# Patient Record
Sex: Female | Born: 1935 | Race: White | Hispanic: No | Smoking: Former smoker
Health system: Southern US, Community
[De-identification: ages and names within clinical notes are randomized; demographics above are authoritative.]

## PROBLEM LIST (undated history)

## (undated) DIAGNOSIS — Z8639 Personal history of other endocrine, nutritional and metabolic disease: Secondary | ICD-10-CM

## (undated) DIAGNOSIS — S92009A Unspecified fracture of unspecified calcaneus, initial encounter for closed fracture: Secondary | ICD-10-CM

## (undated) DIAGNOSIS — N3941 Urge incontinence: Secondary | ICD-10-CM

## (undated) DIAGNOSIS — Z8719 Personal history of other diseases of the digestive system: Secondary | ICD-10-CM

## (undated) DIAGNOSIS — I739 Peripheral vascular disease, unspecified: Secondary | ICD-10-CM

## (undated) DIAGNOSIS — F419 Anxiety disorder, unspecified: Secondary | ICD-10-CM

## (undated) DIAGNOSIS — M1990 Unspecified osteoarthritis, unspecified site: Secondary | ICD-10-CM

## (undated) DIAGNOSIS — I6521 Occlusion and stenosis of right carotid artery: Secondary | ICD-10-CM

## (undated) DIAGNOSIS — I639 Cerebral infarction, unspecified: Secondary | ICD-10-CM

## (undated) DIAGNOSIS — J309 Allergic rhinitis, unspecified: Secondary | ICD-10-CM

## (undated) DIAGNOSIS — G5603 Carpal tunnel syndrome, bilateral upper limbs: Secondary | ICD-10-CM

## (undated) DIAGNOSIS — K589 Irritable bowel syndrome without diarrhea: Secondary | ICD-10-CM

## (undated) DIAGNOSIS — R269 Unspecified abnormalities of gait and mobility: Secondary | ICD-10-CM

## (undated) DIAGNOSIS — K648 Other hemorrhoids: Secondary | ICD-10-CM

## (undated) DIAGNOSIS — G609 Hereditary and idiopathic neuropathy, unspecified: Secondary | ICD-10-CM

## (undated) DIAGNOSIS — I10 Essential (primary) hypertension: Secondary | ICD-10-CM

## (undated) DIAGNOSIS — K5730 Diverticulosis of large intestine without perforation or abscess without bleeding: Secondary | ICD-10-CM

## (undated) DIAGNOSIS — I63311 Cerebral infarction due to thrombosis of right middle cerebral artery: Secondary | ICD-10-CM

## (undated) DIAGNOSIS — J449 Chronic obstructive pulmonary disease, unspecified: Secondary | ICD-10-CM

## (undated) HISTORY — DX: Unspecified abnormalities of gait and mobility: R26.9

## (undated) HISTORY — DX: Anxiety disorder, unspecified: F41.9

## (undated) HISTORY — DX: Essential (primary) hypertension: I10

## (undated) HISTORY — DX: Unspecified fracture of unspecified calcaneus, initial encounter for closed fracture: S92.009A

## (undated) HISTORY — DX: Cerebral infarction due to thrombosis of right middle cerebral artery: I63.311

## (undated) HISTORY — DX: Cerebral infarction, unspecified: I63.9

## (undated) HISTORY — DX: Peripheral vascular disease, unspecified: I73.9

## (undated) HISTORY — DX: Hereditary and idiopathic neuropathy, unspecified: G60.9

## (undated) HISTORY — DX: Irritable bowel syndrome without diarrhea: K58.9

---

## 1971-05-27 HISTORY — PX: DILATION AND CURETTAGE OF UTERUS: SHX78

## 2003-05-27 HISTORY — PX: COLONOSCOPY: SHX174

## 2005-09-08 DIAGNOSIS — R5383 Other fatigue: Secondary | ICD-10-CM | POA: Insufficient documentation

## 2005-09-25 ENCOUNTER — Ambulatory Visit: Payer: Self-pay | Admitting: Gastroenterology

## 2005-09-30 ENCOUNTER — Ambulatory Visit: Payer: Self-pay | Admitting: Gastroenterology

## 2006-01-05 DIAGNOSIS — N3281 Overactive bladder: Secondary | ICD-10-CM | POA: Insufficient documentation

## 2006-05-20 DIAGNOSIS — K5900 Constipation, unspecified: Secondary | ICD-10-CM | POA: Insufficient documentation

## 2006-11-11 ENCOUNTER — Ambulatory Visit: Payer: Self-pay | Admitting: Vascular Surgery

## 2006-12-02 DIAGNOSIS — R269 Unspecified abnormalities of gait and mobility: Secondary | ICD-10-CM | POA: Insufficient documentation

## 2006-12-16 DIAGNOSIS — F419 Anxiety disorder, unspecified: Secondary | ICD-10-CM | POA: Insufficient documentation

## 2007-05-04 ENCOUNTER — Ambulatory Visit: Payer: Self-pay | Admitting: Vascular Surgery

## 2007-11-02 ENCOUNTER — Ambulatory Visit: Payer: Self-pay | Admitting: Vascular Surgery

## 2007-11-09 ENCOUNTER — Ambulatory Visit: Payer: Self-pay | Admitting: Vascular Surgery

## 2007-11-18 ENCOUNTER — Encounter: Payer: Self-pay | Admitting: Vascular Surgery

## 2007-11-18 ENCOUNTER — Ambulatory Visit: Payer: Self-pay | Admitting: Vascular Surgery

## 2007-11-18 ENCOUNTER — Inpatient Hospital Stay (HOSPITAL_COMMUNITY): Admission: RE | Admit: 2007-11-18 | Discharge: 2007-11-19 | Payer: Self-pay | Admitting: Vascular Surgery

## 2007-11-18 HISTORY — PX: CAROTID ENDARTERECTOMY: SUR193

## 2007-11-25 DIAGNOSIS — I70219 Atherosclerosis of native arteries of extremities with intermittent claudication, unspecified extremity: Secondary | ICD-10-CM | POA: Insufficient documentation

## 2007-12-07 ENCOUNTER — Ambulatory Visit: Payer: Self-pay | Admitting: Vascular Surgery

## 2008-05-09 ENCOUNTER — Ambulatory Visit: Payer: Self-pay | Admitting: Vascular Surgery

## 2008-11-11 ENCOUNTER — Emergency Department (HOSPITAL_COMMUNITY): Admission: EM | Admit: 2008-11-11 | Discharge: 2008-11-12 | Payer: Self-pay | Admitting: Emergency Medicine

## 2008-11-14 DIAGNOSIS — E871 Hypo-osmolality and hyponatremia: Secondary | ICD-10-CM | POA: Insufficient documentation

## 2009-02-09 ENCOUNTER — Ambulatory Visit: Payer: Self-pay | Admitting: Vascular Surgery

## 2009-05-02 DIAGNOSIS — M25569 Pain in unspecified knee: Secondary | ICD-10-CM | POA: Insufficient documentation

## 2009-05-02 DIAGNOSIS — M545 Low back pain, unspecified: Secondary | ICD-10-CM | POA: Insufficient documentation

## 2009-05-28 DIAGNOSIS — R059 Cough, unspecified: Secondary | ICD-10-CM | POA: Insufficient documentation

## 2009-05-31 ENCOUNTER — Encounter: Admission: RE | Admit: 2009-05-31 | Discharge: 2009-05-31 | Payer: Self-pay | Admitting: Internal Medicine

## 2009-09-13 ENCOUNTER — Ambulatory Visit: Payer: Self-pay | Admitting: Vascular Surgery

## 2009-12-07 DIAGNOSIS — R195 Other fecal abnormalities: Secondary | ICD-10-CM | POA: Insufficient documentation

## 2010-02-20 ENCOUNTER — Encounter: Admission: RE | Admit: 2010-02-20 | Discharge: 2010-02-20 | Payer: Self-pay | Admitting: Internal Medicine

## 2010-03-11 DIAGNOSIS — K589 Irritable bowel syndrome without diarrhea: Secondary | ICD-10-CM

## 2010-03-11 HISTORY — DX: Irritable bowel syndrome, unspecified: K58.9

## 2010-03-27 ENCOUNTER — Ambulatory Visit: Payer: Self-pay | Admitting: Vascular Surgery

## 2010-06-24 DIAGNOSIS — E507 Other ocular manifestations of vitamin A deficiency: Secondary | ICD-10-CM | POA: Insufficient documentation

## 2010-06-24 DIAGNOSIS — N895 Stricture and atresia of vagina: Secondary | ICD-10-CM | POA: Insufficient documentation

## 2010-06-24 DIAGNOSIS — K117 Disturbances of salivary secretion: Secondary | ICD-10-CM | POA: Insufficient documentation

## 2010-09-02 LAB — BASIC METABOLIC PANEL WITH GFR
BUN: 19 mg/dL (ref 6–23)
Calcium: 8.9 mg/dL (ref 8.4–10.5)
Chloride: 96 meq/L (ref 96–112)
Creatinine, Ser: 0.77 mg/dL (ref 0.4–1.2)
GFR calc non Af Amer: >60 mL/min (ref >60)
Potassium: 3.8 meq/L (ref 3.5–5.1)

## 2010-09-02 LAB — URINALYSIS, ROUTINE W REFLEX MICROSCOPIC
Bilirubin Urine: NEGATIVE
Glucose, UA: NEGATIVE mg/dL
Ketones, ur: NEGATIVE mg/dL
Leukocytes, UA: NEGATIVE
Nitrite: NEGATIVE
Protein, ur: NEGATIVE mg/dL
Specific Gravity, Urine: 1.005 (ref 1.005–1.030)
Urobilinogen, UA: 0.2 mg/dL (ref 0.0–1.0)
pH: 7.5 (ref 5.0–8.0)

## 2010-09-02 LAB — BASIC METABOLIC PANEL
CO2: 29 mEq/L (ref 19–32)
GFR calc Af Amer: >60 mL/min (ref >60)
Glucose, Bld: 125 mg/dL — ABNORMAL HIGH (ref 70–99)
Sodium: 131 mEq/L — ABNORMAL LOW (ref 135–145)

## 2010-09-02 LAB — CBC
HCT: 37.9 % (ref 36.0–46.0)
Hemoglobin: 12.4 g/dL (ref 12.0–15.0)
MCHC: 32.6 g/dL (ref 30.0–36.0)
MCV: 90.1 fL (ref 78.0–100.0)
Platelets: 253 10*3/uL (ref 150–400)
RBC: 4.21 MIL/uL (ref 3.87–5.11)
RDW: 14.8 % (ref 11.5–15.5)
WBC: 6.4 10*3/uL (ref 4.0–10.5)

## 2010-09-02 LAB — DIFFERENTIAL
Basophils Absolute: 0 K/uL (ref 0.0–0.1)
Basophils Relative: 1 % (ref 0–1)
Eosinophils Absolute: 0 10*3/uL (ref 0.0–0.7)
Eosinophils Relative: 0 % (ref 0–5)
Lymphocytes Relative: 15 % (ref 12–46)
Lymphs Abs: 1 10*3/uL (ref 0.7–4.0)
Monocytes Absolute: 0.3 K/uL (ref 0.1–1.0)
Monocytes Relative: 5 % (ref 3–12)
Neutro Abs: 5 K/uL (ref 1.7–7.7)
Neutrophils Relative %: 79 % — ABNORMAL HIGH (ref 43–77)

## 2010-09-02 LAB — URINE CULTURE: Colony Count: 50000

## 2010-09-02 LAB — URINE MICROSCOPIC-ADD ON

## 2010-09-17 DIAGNOSIS — R0989 Other specified symptoms and signs involving the circulatory and respiratory systems: Secondary | ICD-10-CM

## 2010-09-17 DIAGNOSIS — R202 Paresthesia of skin: Secondary | ICD-10-CM

## 2010-09-17 DIAGNOSIS — M542 Cervicalgia: Secondary | ICD-10-CM

## 2010-09-17 DIAGNOSIS — R1011 Right upper quadrant pain: Secondary | ICD-10-CM

## 2010-09-17 DIAGNOSIS — K117 Disturbances of salivary secretion: Secondary | ICD-10-CM

## 2010-09-17 DIAGNOSIS — R350 Frequency of micturition: Secondary | ICD-10-CM

## 2010-09-17 DIAGNOSIS — M545 Low back pain, unspecified: Secondary | ICD-10-CM

## 2010-09-17 DIAGNOSIS — H11149 Conjunctival xerosis, unspecified, unspecified eye: Secondary | ICD-10-CM

## 2010-09-17 DIAGNOSIS — Z9181 History of falling: Secondary | ICD-10-CM

## 2010-09-17 DIAGNOSIS — R5383 Other fatigue: Secondary | ICD-10-CM

## 2010-09-17 DIAGNOSIS — I10 Essential (primary) hypertension: Secondary | ICD-10-CM

## 2010-09-17 DIAGNOSIS — R55 Syncope and collapse: Secondary | ICD-10-CM

## 2010-09-17 DIAGNOSIS — M25569 Pain in unspecified knee: Secondary | ICD-10-CM

## 2010-09-17 DIAGNOSIS — K219 Gastro-esophageal reflux disease without esophagitis: Secondary | ICD-10-CM

## 2010-09-17 DIAGNOSIS — E871 Hypo-osmolality and hyponatremia: Secondary | ICD-10-CM

## 2010-09-17 DIAGNOSIS — E507 Other ocular manifestations of vitamin A deficiency: Secondary | ICD-10-CM

## 2010-09-17 DIAGNOSIS — E039 Hypothyroidism, unspecified: Secondary | ICD-10-CM

## 2010-09-17 DIAGNOSIS — G4700 Insomnia, unspecified: Secondary | ICD-10-CM

## 2010-09-17 DIAGNOSIS — I6529 Occlusion and stenosis of unspecified carotid artery: Secondary | ICD-10-CM

## 2010-09-17 DIAGNOSIS — G629 Polyneuropathy, unspecified: Secondary | ICD-10-CM

## 2010-09-17 DIAGNOSIS — R252 Cramp and spasm: Secondary | ICD-10-CM

## 2010-09-17 DIAGNOSIS — I739 Peripheral vascular disease, unspecified: Secondary | ICD-10-CM

## 2010-09-17 DIAGNOSIS — R739 Hyperglycemia, unspecified: Secondary | ICD-10-CM

## 2010-09-17 DIAGNOSIS — N390 Urinary tract infection, site not specified: Secondary | ICD-10-CM

## 2010-09-17 DIAGNOSIS — R059 Cough, unspecified: Secondary | ICD-10-CM

## 2010-09-17 DIAGNOSIS — K589 Irritable bowel syndrome without diarrhea: Secondary | ICD-10-CM

## 2010-09-17 DIAGNOSIS — R634 Abnormal weight loss: Secondary | ICD-10-CM

## 2010-09-17 DIAGNOSIS — F419 Anxiety disorder, unspecified: Secondary | ICD-10-CM

## 2010-09-17 DIAGNOSIS — R195 Other fecal abnormalities: Secondary | ICD-10-CM

## 2010-09-17 DIAGNOSIS — I70219 Atherosclerosis of native arteries of extremities with intermittent claudication, unspecified extremity: Secondary | ICD-10-CM

## 2010-09-17 DIAGNOSIS — R269 Unspecified abnormalities of gait and mobility: Secondary | ICD-10-CM

## 2010-09-17 DIAGNOSIS — N895 Stricture and atresia of vagina: Secondary | ICD-10-CM

## 2010-09-17 DIAGNOSIS — K648 Other hemorrhoids: Secondary | ICD-10-CM

## 2010-09-17 DIAGNOSIS — R05 Cough: Secondary | ICD-10-CM

## 2010-09-17 DIAGNOSIS — Z79899 Other long term (current) drug therapy: Secondary | ICD-10-CM

## 2010-09-17 DIAGNOSIS — R42 Dizziness and giddiness: Secondary | ICD-10-CM

## 2010-10-08 NOTE — Procedures (Signed)
CAROTID DUPLEX EXAM   INDICATION:  Followup, carotid artery disease.   HISTORY:  Diabetes:  No.  Cardiac:  No.  Hypertension:  No.  Smoking:  No.  Previous Surgery:  No.  CV History:  No.  Amaurosis Fugax No, Paresthesias No, Hemiparesis No.                                       RIGHT             LEFT  Brachial systolic pressure:         137               135  Brachial Doppler waveforms:         Triphasic         Triphasic  Vertebral direction of flow:        Antegrade         Antegrade  DUPLEX VELOCITIES (cm/sec)  CCA peak systolic                   79                79  ECA peak systolic                   127               68  ICA peak systolic                   144               444  ICA end diastolic                   38                130  PLAQUE MORPHOLOGY:                  Calcific          Mixed  PLAQUE AMOUNT:                      Moderate          Severe  PLAQUE LOCATION:                    ICA/ECA           ICA/ECA   IMPRESSION:  1. Right internal carotid artery shows evidence of 40-59% stenosis      showing no significant change from previous study.  2. Left internal carotid artery shows evidence of 80-99% stenosis, an      increase from previous study.  3. Bilateral vertebral arteries are antegrade; however, left shows      evidence of early systolic deceleration.   Dr. Hart Rochester was informed of results, and appointment was made to see Dr.  Edilia Bo on 11/09/07.   ___________________________________________  Di Kindle. Edilia Bo, M.D.   AS/MEDQ  D:  11/02/2007  T:  11/02/2007  Job:  [id number]

## 2010-10-08 NOTE — Assessment & Plan Note (Signed)
OFFICE VISIT   BABY, GIEGER  DOB:  Oct 17, 1921                                       05/09/2008  [id number]#:[id number]   I saw the patient in the office today for followup of her recent left  carotid endarterectomy.  This is a pleasant 77 year old woman who  underwent left carotid endarterectomy for a high-grade left carotid  stenosis on November 18, 2007.  She returns for a 75-month followup visit.  Since I saw her last she has had no history of stroke, TIAs, expressive  or receptive aphasia, or amaurosis fugax.  She continues to take Plavix  as she is unable to take aspirin because of her reflux.   REVIEW OF SYSTEMS:  She has had no recent chest pain, chest pressure,  palpitations or arrhythmias.  She has had no bronchitis, asthma or  wheezing.   PHYSICAL EXAMINATION:  This is a pleasant 77 year old woman who appears  her stated age.  Blood pressure 132/72, heart rate is 86.  Her neck  incision on the left is healed nicely.  I do not detect any carotid  bruits.  Lungs are clear bilaterally to auscultation.  Cardiac exam, she  has a regular rate and rhythm.  Neurologic exam is nonfocal.   Her carotid duplex scan shows that her left carotid endarterectomy site  is widely patent.  She has a 40-59% right carotid stenosis.  Velocities  have not changed over the last 6 months on the right.   She understands we would not consider right carotid endarterectomy  unless the stenosis progressed to greater than 80% or she develop new  neurologic symptoms.  I will see her back in 6 months for a followup  duplex scan.  She knows to call sooner if she has problems.   Di Kindle. Edilia Bo, M.D.  Electronically Signed   CSD/MEDQ  D:  05/09/2008  T:  05/11/2008  Job:  [id number]   cc:   Lenon Curt. Chilton Si, M.D.

## 2010-10-08 NOTE — Procedures (Signed)
CAROTID DUPLEX EXAM   INDICATION:  Follow-up left carotid endarterectomy.   HISTORY:  Diabetes:  No.  Cardiac:  No.  Hypertension:  No.  Smoking:  No.  Previous Surgery:  Left carotid endarterectomy on 11/18/2007.  CV History:  Asymptomatic.  Amaurosis Fugax No, Paresthesias No, Hemiparesis No.                                       RIGHT             LEFT  Brachial systolic pressure:         152               140  Brachial Doppler waveforms:         Normal            Normal  Vertebral direction of flow:        Antegrade         Bidirectional  DUPLEX VELOCITIES (cm/sec)  CCA peak systolic                   102               122 (D)  ECA peak systolic                   166               118  ICA peak systolic                   138               93  ICA end diastolic                   25                21  PLAQUE MORPHOLOGY:                  Mixed             Heterogeneous  PLAQUE AMOUNT:                      Moderate          Mild  PLAQUE LOCATION:                    ICA/ECA/CCA       CCA   IMPRESSION:  1. A 40-59% stenosis of the right internal carotid artery.  2. Patent left carotid endarterectomy site with no evidence of      internal carotid artery stenosis noted.  3. The left vertebral artery flow appears bidirectional.  4. Velocities are improved on the left and stable on the right side      when compared to the previous exam on 11/02/2007, with the left      vertebral artery flow becoming bidirectional since that time.   ___________________________________________  Di Kindle. Edilia Bo, M.D.   CH/MEDQ  D:  05/09/2008  T:  05/09/2008  Job:  [id number]

## 2010-10-08 NOTE — Discharge Summary (Signed)
[name], Tara Walters               ACCOUNT NO.:  [id number]   MEDICAL RECORD NO.:  [id number]          PATIENT TYPE:  INP   LOCATION:  3313                         FACILITY:  [facility]   PHYSICIAN:  Di Kindle. Edilia Bo, M.D.DATE OF BIRTH:  03/02/1922   DATE OF ADMISSION:  11/18/2007  DATE OF DISCHARGE:  11/19/2007                               DISCHARGE SUMMARY   DISCHARGE DIAGNOSIS:  Severe asymptomatic left internal carotid artery  stenosis.   PROCEDURES PERFORMED:  On November 18, 2007, left carotid endarterectomy  with Dacron patch angioplasty by Dr. Edilia Bo.   COMPLICATIONS:  None.   CONDITION AT DISCHARGE:  Stable and improving.   DISCHARGE MEDICATIONS:  1. Armour Thyroid 30 mg p.o. daily.  2. Nexium 40 mg p.o. daily, a.m. and p.m.  3. Lunesta 2 mg p.o. daily p.r.n. sleep.  4. Plavix 75 mg p.o. daily.  5. Vitamin D 1000 international units daily every morning.  6. Magnesium oxide 140 mg p.o. daily, a.m. and p.m.  7. Tylenol p.r.n.  8. Tums p.r.n.  9. Gaviscon p.r.n.  10.Tylox 1-2 tablets p.o. every 4 hours p.r.n. pain.   DISPOSITION:  She is discharged home to [facility] in stable and  improving condition with her wounds healing well.  She is instructed to  clean the wounds gently with soap and water.  She is to increase her  activity slowly.  She may shower starting November 20, 2007.  She should not  lift for 2 weeks or drive for 2 weeks.  She is instructed to call for  fever greater than 101.2, redness, drainage from incision, or severe  headaches.  She is to return to see Dr. Edilia Bo in 2 weeks.  The office  will call with the visit.   BRIEF IDENTIFYING STATEMENT:  For complete details, please refer to the  typed history and physical.  Briefly, this very pleasant 77 year old  woman was evaluated by Dr. Edilia Bo for asymptomatic left carotid  stenosis.  Her stenosis was significant, greater than 80%.  Dr. Edilia Bo  felt that she should undergo a left carotid endarterectomy.   She was  informed of the risks and benefits of the procedure and after careful  consideration, elected to proceed with surgery.   HOSPITAL COURSE:  Preoperative workup was completed as an outpatient.  She was brought in through same-day surgery and underwent the  aforementioned carotid endarterectomy.  For complete details, please  refer the typed operative report.  The procedure was without  complications.  She was returned to the post anesthesia care unit in  extubated condition.  Following stabilization, she was transferred to a  bed on a surgical step-down unit.  She was  observed overnight.  The following morning, she was walking.  She was  eating.  She was passing water.  Her neurologic exam was nonfocal.  Her  smile was symmetrical.  Her tongue was midline.  She was having no  difficulty swallowing or speaking.  She was desirous of discharge and  was discharged to [facility].      Wilmon Arms, PA  Di Kindle. Edilia Bo, M.D.  Electronically Signed    KEL/MEDQ  D:  11/19/2007  T:  11/19/2007  Job:  [id number]

## 2010-10-08 NOTE — Op Note (Signed)
Tara Walters, Tara Walters               ACCOUNT NO.:  [id number]   MEDICAL RECORD NO.:  [id number]          PATIENT TYPE:  INP   LOCATION:  3313                         FACILITY:  [facility]   PHYSICIAN:  Di Kindle. Edilia Bo, M.D.DATE OF BIRTH:  December 30, 1921   DATE OF PROCEDURE:  11/18/2007  DATE OF DISCHARGE:                               OPERATIVE REPORT   PREOPERATIVE DIAGNOSIS:  Greater than 80% left carotid stenosis.   POSTOPERATIVE DIAGNOSIS:  Greater than 80% left carotid stenosis.   PROCEDURE:  Left carotid endarterectomy with Dacron patch angioplasty.   SURGEON:  Di Kindle. Edilia Bo, MD   ASSISTANT:  Jerold Coombe, PA.   ANESTHESIA:  General.   INDICATIONS:  This is an 77 year old woman who I have been following  with a moderate left carotid stenosis.  On her most recent followup  visit on November 09, 2007, this had progressed to greater than 80% and left  carotid endarterectomy was recommended in order to lower her risk of  stroke.  The procedure and potential complications were discussed with  the patient preoperatively.  All of her questions were answered.  She  was agreeable to proceed.   TECHNIQUE:  The patient was taken to the operating room and received a  general anesthetic.  Arterial line had been placed by anesthesia.  The  left neck and upper chest were prepped and draped in the usual sterile  fashion.  An incision was made along the anterior border of the  sternocleidomastoid and the dissection carried down to the common  carotid artery, which was dissected free and controlled with Rumel  tourniquet.  Facial vein was divided between 2-0 silk ties.  The  internal carotid artery was controlled above the plaque with a red  vessel loop.  The superior thyroid arteries and external carotid artery  was controlled.  The patient was heparinized.  Clamps were then placed  on the internal, then the external, and then the common carotid artery.  A longitudinal  arteriotomy was made in the common carotid artery.  This  was extended to the plaque into the internal carotid artery.  I tried to  place a 12-shunt.  This did not go easily.  Therefore, I placed a 10-  shunt, which went easily and there was good backbleeding.  The shunt was  placed into the internal carotid artery, backbled, and then placed into  the common carotid artery and secured with a Rumel tourniquet.  Flow was  reestablished the shunt and I checked flow with a Doppler.  An  endarterectomy plane was established proximally and the plaque was  sharply divided proximally.  An eversion endarterectomy was performed of  the external carotid artery.  Distally, there was a nice taper in the  plaque and no tacking sutures were required.  The artery was irrigated  with copious amounts of heparin and dextran and all loose debris  removed.  A Dacron patch was then sewn using continuous 6-0 Prolene  suture.  Prior to completing the patch closure, the artery was backbled  and flushed appropriately and after the shunt was removed.  The  anastomosis was completed and then flow reestablished first to the  external carotid artery and then to the internal carotid artery.  At the  completion, there was a good pulse distal to the patch and good Doppler  signal.  Hemostasis was obtained of the wound.  The heparin was  partially reversed with protamine.  The wound was closed with deep layer  of 3-0 Vicryl.  The platysma was closed with running 3-0 Vicryl.  The skin was closed  with a 4-0 subcuticular stitch.  Sterile dressing was applied.  The  patient tolerated the procedure well and was transferred to the recovery  room in satisfactory condition.  She awoke neurologically intact.      Di Kindle. Edilia Bo, M.D.  Electronically Signed     CSD/MEDQ  D:  11/18/2007  T:  11/19/2007  Job:  [id number]   cc:   Lenon Curt. Chilton Si, M.D.

## 2010-10-08 NOTE — Procedures (Signed)
CAROTID DUPLEX EXAM   INDICATION:  Followup, carotid endarterectomy.   HISTORY:  Diabetes:  No.  Cardiac:  No.  Hypertension:  No.  Smoking:  No.  Previous Surgery:  Left carotid endarterectomy on 11/18/2007.  CV History:  Currently asymptomatic.  Amaurosis Fugax No, Paresthesias No, Hemiparesis No.                                       RIGHT             LEFT  Brachial systolic pressure:         162               150  Brachial Doppler waveforms:         Normal            Normal  Vertebral direction of flow:        Antegrade         Antegrade  DUPLEX VELOCITIES (cm/sec)  CCA peak systolic                   83                86  ECA peak systolic                   201               157  ICA peak systolic                   131               84  ICA end diastolic                   19                18  PLAQUE MORPHOLOGY:                  Heterogenous      Heterogenous  PLAQUE AMOUNT:                      Mild              Mild  PLAQUE LOCATION:                    ICA/ECA/CCA       ECA/CCA   IMPRESSION:  1. Doppler velocities suggest a low end 40% to 59% stenosis of the      right proximal internal carotid artery.  2. Patent left carotid endarterectomy site with no left internal      carotid artery stenosis.  3. Flow in the antegrade left vertebral artery demonstrates early      systolic deceleration.  4. No significant change noted when compared to the previous      examination on 02/09/2009.   ___________________________________________  Di Kindle. Edilia Bo, M.D.   CH/MEDQ  D:  03/27/2010  T:  03/27/2010  Job:  [id number]

## 2010-10-08 NOTE — Consult Note (Signed)
VASCULAR SURGERY CONSULTATION   Tara, Walters  DOB:  Jun 12, 1921                                       11/09/2007  [id number]#:[id number]   I saw the patient for continued followup of her carotid disease.  I had  initially seen her in consultation on 11/11/2006.  At that time, she was  noted to have a moderate 60-79% left carotid stenosis and a 40-59% right  carotid stenosis.  She had a followup study in December of 2008 which  showed no significant change.  On her most recent study, however, the  stenosis on the left had progressed to greater than 80%.  Of note, since  I saw her last, she has had no history of stroke, TIAs, expressive or  receptive aphasia, or amaurosis fugax.  There has been no significant  change in her medical history.  She denies any history of diabetes,  hypertension, hypercholesterolemia, history of previous myocardial  infarction, or history of congestive heart failure.  She does have a  history of reflux.   FAMILY HISTORY:  There is no history of premature cardiovascular  disease.   SOCIAL HISTORY:  She is widowed.  She quit tobacco in 1985.  Of note,  she resides at [facility].   ALLERGIES:  No known drug allergies.   MEDICATIONS:  1. Uro-Mag 140 mg p.o. b.i.d.  2. MiraLax 1 tablespoon p.r.n.  3. Colace 1 p.o. daily p.r.n.  4. Tums 1 q.i.d.  5. Vitamin D daily.  6. Thyroid 30 mg p.o. daily.  7. Nexium 40 mg p.o. b.i.d.  8. Lunesta 2 mg p.o. nightly p.r.n.   Of note, she does not take aspirin because it upsets her stomach.   REVIEW OF SYSTEMS:  GENERAL:  She has had no recent weight loss, weight  gain or problems with her appetite.  CARDIAC:  She has had no chest pain, chest pressure, palpitations or  arrhythmias.  She has no significant dyspnea on exertion.  PULMONARY:  She has had no bronchitis, asthma or wheezing.  GI:  She has had no recent change in bowel habits and has no history of  peptic ulcer disease.  GU:  She has had  no dysuria or frequency.  ORTHO/SKIN:  She has had some mild arthritis.  NEURO:  She has had no dizziness, blackouts, headaches or seizures.  HEMATOLOGIC:  She has had no bleeding problems that she is aware of.   PHYSICAL EXAMINATION:  This is a pleasant 77 year old woman who appears  her stated age.  Blood pressure is 166/68, heart rate is 68.  The neck  is supple.  There is no cervical lymphadenopathy.  She has left carotid  bruit.  Lungs are clear bilaterally to auscultation.  On cardiac exam,  she has a regular rate and rhythm.  Her abdomen is soft and nontender.  She has normal pitched bowel sounds.  She has palpable femoral pulses  and warm, well-perfused feet.  She has no significant lower extremity  swelling.  Neurologic exam is nonfocal.   Her carotid duplex scan shows that she has a 40-59% right carotid  stenosis.  She has a greater than 80% left carotid stenosis.  Peak  systolic velocities 444 cm/sec and end-diastolic velocity is 130 cm/sec.   Given that the stenosis on the left has progressed to greater than 80%,  I have  recommended left carotid endarterectomy in order to lower her  risk of future stroke.  We have discussed the indications for the  procedure and potential complications including but not limited to  bleeding, stroke (peri-procedural risk of 1-2%), nerve injury, MI, or  other unpredictable medical problems.  The daughter would like to  discuss this further with her and is considering getting another  opinion.  I explained I think this is perfectly reasonable.  She is  asymptomatic, so I do not think there is any urgency to the procedure.  I have told them that if they do elect to proceed with surgery, that  they can call and we can schedule this.  Of note, she is unable to take  aspirin and I have explained that would like to put on Plavix 75 mg  daily starting 2 to 3 days prior to surgery.  I will wait to hear from  her.  Otherwise, I plan on seeing her back  in 6 months.   Di Kindle. Edilia Bo, M.D.  Electronically Signed  CSD/MEDQ  D:  11/09/2007  T:  11/10/2007  Job:  [id number]   cc:   Lenon Curt. Chilton Si, M.D.

## 2010-10-08 NOTE — Consult Note (Signed)
VASCULAR SURGERY CONSULTATION   Tara Walters, Tara Walters  DOB:  07-Feb-1922                                       11/11/2006  [id number]#:[id number]   I saw [name] in the office today in consultation concerning her  moderate carotid disease.  She was referred by Dr. Chilton Si.  This is a  pleasant 77 year old right-handed woman who was found by Dr. Chilton Si to  have an asymptomatic carotid bruit.  This prompted a duplex scan which  showed a significant left carotid stenosis and she was sent for vascular  consultation.  Of note, she denies any history of a stroke, TIAs,  expressive or receptive aphasia, or amaurosis fugax.  She is ambulatory  with a walker.  She resides at [facility].   PAST MEDICAL HISTORY:  Is fairly unremarkable.  She denies any history  of diabetes, hypertension, hypercholesterolemia, history of previous  myocardial infarction or history of congestive heart failure.  She does  have a history of reflux.   FAMILY HISTORY:  There is no history of premature cardiovascular disease  that she is aware of.   SOCIAL HISTORY:  She is widowed.  She quit tobacco in 1985.   REVIEW OF SYSTEMS AND MEDICATIONS:  Are documented on the medical  history forms in her chart.   PHYSICAL EXAMINATION:  Blood pressure is 169/77, heart rate is 76.  She  has a left carotid bruit.  Lungs are clear bilaterally to auscultation.  On cardiac exam, she has a regular rate and rhythm.  Her abdomen is soft  and nontender.  I cannot palpate an aneurysm.  She has palpable femoral  pulses and warm, well-perfused feet.  Neurologic exam is nonfocal.   Carotid duplex scan in our office showed a 60-79% left carotid stenosis.  End-diastolic velocity in the left is 93 cm per second, so this was  clearly less than 80%.  On the right side she has a 40-59% stenosis.  Both vertebral arteries are patent with normally directed-flow.   I explained we generally do not consider carotid endarterectomy in  patients who are asymptomatic unless the stenosis progressed to greater  than 80% or she developed new neurologic symptoms.  I will see her back  in 6 months for a followup duplex scan.  She knows to call sooner if she  has problems.  She tells me that she cannot take aspirin because of her  reflux.  If it was at all possible for her to take an enteric-coated  baby aspirin, that would be helpful.  She knows to call sooner if she  has problems.   Di Kindle. Edilia Bo, M.D.  Electronically Signed  CSD/MEDQ  D:  11/11/2006  T:  11/12/2006  Job:  [id number]   cc:   Lenon Curt. Chilton Si, M.D.

## 2010-10-08 NOTE — Procedures (Signed)
CAROTID DUPLEX EXAM   INDICATION:  Followup evaluation of known carotid artery disease.   HISTORY:  Diabetes:  No  Cardiac:  No  Hypertension:  No  Smoking:  No  Previous Surgery:  No  CV History:  Previous duplex revealed a 40-59% right ICA stenosis and a  60-79% left ICA artery stenosis on 11/11/2006  Amaurosis Fugax No, Paresthesias No, Hemiparesis No                                       RIGHT             LEFT  Brachial systolic pressure:         140               l36  Brachial Doppler waveforms:         Triphasic         Triphasic  Vertebral direction of flow:        Antegrade         Antegrade  DUPLEX VELOCITIES (cm/sec)  CCA peak systolic                   93                83  ECA peak systolic                   159               124  ICA peak systolic                   122               362  ICA end diastolic                   19                87  PLAQUE MORPHOLOGY:                  Calcified         Mixed  PLAQUE AMOUNT:                      Mild to moderate  Moderately severe  PLAQUE LOCATION:                    Proximal ICA/ECA  Proximal ICA   IMPRESSION:  1. 40-59% right internal carotid artery stenosis.  2. 60-80% left internal carotid artery stenosis (high end of range).  3. No significant change since previous study performed 11/11/2006.   ___________________________________________  Di Kindle. Edilia Bo, M.D.   MC/MEDQ  D:  05/04/2007  T:  05/05/2007  Job:  [id number]

## 2010-10-08 NOTE — Assessment & Plan Note (Signed)
OFFICE VISIT   Tara Walters, Tara Walters  DOB:  22-Jul-1921                                       12/07/2007  JYNWG#:[id number]   I saw the patient in the office today in followup after her recent left  carotid endarterectomy.  This is an 77 year old with a moderate left  carotid stenosis.  This had progressed to greater than 80% and a left  carotid endarterectomy was recommended in order to lower her risk of  future stroke.  She underwent left carotid endarterectomy on 11/18/2007.  She did well postoperatively and was discharged on postoperative day #1.  She returns for her first followup visit.   Overall she has been doing quite well and has no specific complaints  except for some paresthesias in both feet, more significantly on the  left side.  She also describes some claudication bilaterally, more  significantly on the right side.  She has had no rest pain in her feet  and no history of nonhealing wounds.  Since her surgery she has had no  focal weakness and no expressive or receptive aphasia or amaurosis  fugax.   PHYSICAL EXAMINATION:  Vital signs:  On physical examination blood  pressure is 138/75, heart rate is 85.  Neck:  Her neck incision is  healing nicely.  Lungs:  Are clear bilaterally to auscultation.  Cardiac:  She has a regular rate and rhythm.  She has palpable femoral  pulses.  I cannot palpate popliteal or pedal pulses on either side.  She  has monophasic Doppler signals in the dorsalis pedis and posterior  tibial position on the right.  She has biphasic signals in the left  foot.  Both feet appear adequately perfused.   Given that her paresthesias are worse on the left side and she has  essentially biphasic flow on the left I think that her paresthesias are  more likely related to a neuropathy or possibly lumbar disk disease.  Based on her records from [facility] it appears she does have  some disk disease at the L4 and L5 level.  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  Regardless, I  certainly would not recommend any invasive vascular workup unless she  developed rest pain or a nonhealing wound.  I will see her back in 6  months for followup carotid duplex to follow her moderate right carotid  stenosis.  She knows to call sooner if she has problems.  In the  meantime she will remain on Plavix as she is not tolerant of aspirin.   Di Kindle. Edilia Bo, M.D.  Electronically Signed   CSD/MEDQ  D:  12/07/2007  T:  12/08/2007  Job:  [id number]   cc:   Lenon Curt. Chilton Si, M.D.  Toribio Harbour, N.P.

## 2010-10-08 NOTE — Procedures (Signed)
CAROTID DUPLEX EXAM   INDICATION:  Left carotid endarterectomy.   HISTORY:  Diabetes:  No.  Cardiac:  No.  Hypertension:  No.  Smoking:  No.  Previous Surgery:  Left carotid endarterectomy on 11/18/2007.  CV History:  Currently asymptomatic.  Amaurosis Fugax No, Paresthesias No, Hemiparesis No                                       RIGHT             LEFT  Brachial systolic pressure:         148               140  Brachial Doppler waveforms:         Normal            Normal  Vertebral direction of flow:        Antegrade         Bidirectional  DUPLEX VELOCITIES (cm/sec)  CCA peak systolic                   121               131  ECA peak systolic                   165               185  ICA peak systolic                   134               94  ICA end diastolic                   20                16  PLAQUE MORPHOLOGY:                  Mixed             Mixed  PLAQUE AMOUNT:                      Moderate          Mild  PLAQUE LOCATION:                    ICA/ECA/CCA       CCA   IMPRESSION:  1. 40-59% stenosis of the right internal carotid artery.  2. Patent left carotid endarterectomy with no evidence of left      internal carotid artery stenosis.  3. No significant change noted when compared to the previous exam on      05/09/2008.   ___________________________________________  Di Kindle. Edilia Bo, M.D.   CH/MEDQ  D:  02/09/2009  T:  02/10/2009  Job:  [id number]

## 2010-10-08 NOTE — Assessment & Plan Note (Signed)
OFFICE VISIT   Tara Walters, Tara Walters  DOB:  01-24-1922                                       09/13/2009  [id number]#:[id number]   I saw the patient in the office today concerning her leg pain.  She was  referred by Dr. Chilton Si.  This is a pleasant 77 year old woman who I had  actually performed a left carotid endarterectomy on in June 2009.  She  comes in today with a chief complaint of pain in both calves associated  with ambulation that is relieved with rest.  These symptoms have been  going on for approximately 6 months.  Her symptoms are more significant  on the right side.  The pain occurs at a variable distance.  There are  no alleviating factors except for rest.  There are no other aggravating  factors.  She denies any history of rest pain and has had no history of  nonhealing wounds.  In addition, she has had some paresthesias in her  feet, which she states are aggravated by walking and also aggravated by  sitting.   PAST MEDICAL HISTORY:  Significant for hypertension.  She denies any  history of diabetes, hypercholesterolemia, history of previous  myocardial infarction or history of congestive heart failure.   SOCIAL HISTORY:  She is widowed.  She quit tobacco well over 20 years  ago.   REVIEW OF SYSTEMS:  CARDIOVASCULAR:  She has had no chest pain or chest  pressure.  She does admit to dyspnea on exertion and occasional  orthopnea.  She has had no history of arrhythmias.  She has had no  history of stroke, TIAs or amaurosis fugax.  She has had no history of  DVT or phlebitis.  MUSCULOSKELETAL:  She does have a history of arthritis.  GI:  She does have a history of reflux and occasional diarrhea and  constipation.   PHYSICAL EXAMINATION:  This is a pleasant 77 year old woman who appears  her stated age.  Her blood pressure is 165/77, saturation is 100%, heart  rate is 54.  HEENT is unremarkable.  Lungs are clear bilaterally to  auscultation without  rales, rhonchi or wheezing.  On cardiovascular  exam, I do not detect any carotid bruits.  She has a regular rate and  rhythm.  There is no significant peripheral edema.  She has palpable  radial and femoral pulses.  I cannot palpate popliteal or pedal pulses.  She has no ischemic ulcers.  Abdomen is soft and nontender with no  masses appreciated.  She has normal-pitched bowel sounds.  Neurologic  exam:  She has no focal weakness or paresthesias.   I have reviewed her arterial Doppler study, which was done on May 31, 2009, and this shows monophasic Doppler signals in the dorsalis pedis  and posterior tibial positions.  She has an ABI of 67% on the right and  63% on the left.  Toe pressure is 118 mmHg on the right and 93 mmHg on  the left.  I have also reviewed her chest x-ray, which was done on  May 31, 2009, which showed COPD and stable mild cardiomegaly.   Based on her exam, she has evidence of bilateral infrainguinal arterial  occlusive disease.  However, currently her symptoms are quite tolerable  and she has no history of rest pain or nonhealing ulcers.  I  have  explained we generally would not consider arteriography and  revascularization unless she developed disabling claudication, rest pain  or a nonhealing ulcer.  I have encouraged her to continue to walk as  much as possible.  We discussed the potential use of cilostazol and we  both agree that we would hold off on this for now.  I have ordered  followup ABIs in September when she comes back for her routine followup  carotid duplex scan.  Her most recent carotid duplex scan was in  September 2010 and showed no evidence of significant carotid disease or  recurrent stenosis on the side of her previous left carotid  endarterectomy.  She knows to call sooner if she has problems.     Di Kindle. Edilia Bo, M.D.  Electronically Signed   CSD/MEDQ  D:  09/13/2009  T:  09/17/2009  Job:  [id number]   cc:   Dr. Frederik Pear

## 2010-10-08 NOTE — Assessment & Plan Note (Signed)
OFFICE VISIT   CAMARY, SOSA  DOB:  Oct 17, 1921                                       03/27/2010  BJYNW#:[id number]   NOTE:  Dictation code E4.   I saw the patient in the office today for continued followup of her  carotid disease and her peripheral vascular disease.  This is a pleasant  77 year old woman who had undergone a left carotid endarterectomy back  in 2009 and since that time I have been following a moderate right  carotid stenosis.  In addition, she has a history of peripheral vascular  disease.   Since I saw her last in April 2011, she denies any history of stroke,  TIAs, expressive or receptive aphasia, or amaurosis fugax.  She lives at  [facility].  She is ambulatory with a walker.  I do not get any history  of claudication although her activity is fairly limited.  I do not get  any history of rest pain or nonhealing ulcers.  She does have  paresthesias in both lower extremities, which have been stable and came  on gradually.   PAST MEDICAL HISTORY:  Significant for adult-onset diabetes,  hypertension.  She denies any history of previous myocardial infarction,  history congestive heart failure or history of COPD.   SOCIAL HISTORY:  She is widowed.  She does not smoke cigarettes.   REVIEW OF SYSTEMS:  CARDIOVASCULAR:  She has had no chest pain, chest  pressure, palpitations or arrhythmias.  She does admit to dyspnea on  exertion.  She has had no history of DVT or phlebitis.  GI:  She does have a history of reflux.  PULMONARY:  She had no recent productive cough bronchitis, asthma or  wheezing.   PHYSICAL EXAMINATION:  This is a pleasant 77 year old woman who appears  her stated age.  Her blood pressure is 138/60, heart rate is 59,  saturation 100%.  Lungs are clear bilaterally to auscultation without  rales, rhonchi or wheezing.  On cardiovascular exam I do not detect any  carotid bruits.  She has a regular rate and rhythm.  She has  palpable  femoral pulses.  I cannot palpate pedal pulses.  She has no ischemic  ulcers.  She has no significant lower extremity swelling.  Her abdomen  is soft and nontender with normal-pitched bowel sounds.  Musculoskeletal  exam:  There are no major deformities or cyanosis.  Neurologic exam:  There is no focal weakness or paresthesias.   I have independently interpreted her arterial Doppler study, which shows  a biphasic posterior tibial signal on the right with a monophasic  dorsalis pedis signal and an ABI of 77%.  On the left side she has a  monophasic posterior tibial signal with a biphasic dorsalis pedis signal  and an ABI of 66%.  She has evidence of moderate arterial insufficiency.   I have also independently interpreted her carotid duplex scan, which  shows that her left carotid endarterectomy site is widely patent.  On  the right side she has a 40%-59% stenosis.  These velocities are stable  compared to her previous study in September 2010.   Given that her duplex is stable, I think it is safe at this point to  stretch her followup out to a year and a half.  I have ordered a  followup carotid duplex scan  in 18 months and also followup ABIs at that  time.  I have encouraged her to stay as active as possible.  She knows  to call sooner if she has problems.     Di Kindle. Edilia Bo, M.D.  Electronically Signed   CSD/MEDQ  D:  03/27/2010  T:  03/28/2010  Job:  [id number]   cc:   Lenon Curt. Chilton Si, M.D.

## 2010-12-27 ENCOUNTER — Other Ambulatory Visit: Payer: Self-pay | Admitting: Geriatric Medicine

## 2010-12-27 ENCOUNTER — Other Ambulatory Visit: Payer: Self-pay | Admitting: Internal Medicine

## 2010-12-27 ENCOUNTER — Ambulatory Visit
Admission: RE | Admit: 2010-12-27 | Discharge: 2010-12-27 | Disposition: A | Discharge status: Home / Self Care | Payer: BC Managed Care – HMO | Source: Ambulatory Visit | Attending: Internal Medicine | Admitting: Internal Medicine

## 2010-12-27 DIAGNOSIS — R200 Anesthesia of skin: Secondary | ICD-10-CM

## 2010-12-27 DIAGNOSIS — M542 Cervicalgia: Secondary | ICD-10-CM

## 2011-02-20 LAB — BASIC METABOLIC PANEL
BUN: 9
CO2: 28
Calcium: 8.8
Chloride: 107
Creatinine, Ser: 0.85
GFR calc Af Amer: >60

## 2011-02-20 LAB — CBC
HCT: 40.1
MCHC: 33.2
MCV: 88.6
Platelets: 133 — ABNORMAL LOW
Platelets: 198
RBC: 3.41 — ABNORMAL LOW
RDW: 14.7

## 2011-02-20 LAB — URINALYSIS, ROUTINE W REFLEX MICROSCOPIC
Glucose, UA: NEGATIVE
Ketones, ur: NEGATIVE
Protein, ur: NEGATIVE
pH: 6.5

## 2011-02-20 LAB — TYPE AND SCREEN
ABO/RH(D): A NEG
Antibody Screen: NEGATIVE

## 2011-02-20 LAB — COMPREHENSIVE METABOLIC PANEL
ALT: 24
Albumin: 4
CO2: 28
Chloride: 99
GFR calc Af Amer: >60
GFR calc non Af Amer: 58 — ABNORMAL LOW
Potassium: 3.9
Sodium: 138

## 2011-02-20 LAB — URINE MICROSCOPIC-ADD ON

## 2011-02-20 LAB — PROTIME-INR: Prothrombin Time: 13.2

## 2011-02-20 LAB — APTT: aPTT: 26

## 2011-06-05 DIAGNOSIS — N3941 Urge incontinence: Secondary | ICD-10-CM | POA: Diagnosis not present

## 2011-06-05 DIAGNOSIS — R350 Frequency of micturition: Secondary | ICD-10-CM | POA: Diagnosis not present

## 2011-06-22 DIAGNOSIS — N289 Disorder of kidney and ureter, unspecified: Secondary | ICD-10-CM | POA: Diagnosis not present

## 2011-06-22 DIAGNOSIS — F4489 Other dissociative and conversion disorders: Secondary | ICD-10-CM | POA: Diagnosis not present

## 2011-07-03 DIAGNOSIS — R3915 Urgency of urination: Secondary | ICD-10-CM | POA: Diagnosis not present

## 2011-07-03 DIAGNOSIS — R350 Frequency of micturition: Secondary | ICD-10-CM | POA: Diagnosis not present

## 2011-07-14 DIAGNOSIS — R32 Unspecified urinary incontinence: Secondary | ICD-10-CM | POA: Diagnosis not present

## 2011-07-14 DIAGNOSIS — I10 Essential (primary) hypertension: Secondary | ICD-10-CM | POA: Diagnosis not present

## 2011-07-14 DIAGNOSIS — R0602 Shortness of breath: Secondary | ICD-10-CM | POA: Diagnosis not present

## 2011-07-14 DIAGNOSIS — G5600 Carpal tunnel syndrome, unspecified upper limb: Secondary | ICD-10-CM | POA: Diagnosis not present

## 2011-07-14 DIAGNOSIS — G609 Hereditary and idiopathic neuropathy, unspecified: Secondary | ICD-10-CM | POA: Diagnosis not present

## 2011-07-15 ENCOUNTER — Other Ambulatory Visit (HOSPITAL_COMMUNITY): Payer: Self-pay | Admitting: Internal Medicine

## 2011-07-15 DIAGNOSIS — R0602 Shortness of breath: Secondary | ICD-10-CM

## 2011-07-15 DIAGNOSIS — L608 Other nail disorders: Secondary | ICD-10-CM | POA: Diagnosis not present

## 2011-07-15 DIAGNOSIS — G609 Hereditary and idiopathic neuropathy, unspecified: Secondary | ICD-10-CM | POA: Diagnosis not present

## 2011-07-18 ENCOUNTER — Ambulatory Visit (HOSPITAL_COMMUNITY)
Admission: RE | Admit: 2011-07-18 | Discharge: 2011-07-18 | Disposition: A | Discharge status: Home / Self Care | Payer: Medicare Other | Source: Ambulatory Visit | Attending: Internal Medicine | Admitting: Internal Medicine

## 2011-07-18 ENCOUNTER — Other Ambulatory Visit (HOSPITAL_COMMUNITY): Payer: Self-pay | Admitting: Radiology

## 2011-07-18 ENCOUNTER — Inpatient Hospital Stay (HOSPITAL_COMMUNITY)
Admission: RE | Admit: 2011-07-18 | Discharge: 2011-07-18 | Disposition: A | Discharge status: Home / Self Care | Payer: Medicare Other | Source: Ambulatory Visit

## 2011-07-18 DIAGNOSIS — R0602 Shortness of breath: Secondary | ICD-10-CM | POA: Diagnosis not present

## 2011-07-18 LAB — BLOOD GAS, ARTERIAL
Drawn by: 24231
FIO2: 0.21 %
O2 Saturation: 97.3 %
Patient temperature: 98.6
pH, Arterial: 7.395 (ref 7.350–7.400)

## 2011-07-18 MED ORDER — ALBUTEROL SULFATE (5 MG/ML) 0.5% IN NEBU
2.5000 mg | INHALATION_SOLUTION | Freq: Once | RESPIRATORY_TRACT | Status: AC
  Administered 2011-07-18: 2.5 mg via RESPIRATORY_TRACT

## 2011-07-31 DIAGNOSIS — N3941 Urge incontinence: Secondary | ICD-10-CM | POA: Diagnosis not present

## 2011-07-31 DIAGNOSIS — R350 Frequency of micturition: Secondary | ICD-10-CM | POA: Diagnosis not present

## 2011-07-31 DIAGNOSIS — R3915 Urgency of urination: Secondary | ICD-10-CM | POA: Diagnosis not present

## 2011-08-18 DIAGNOSIS — J449 Chronic obstructive pulmonary disease, unspecified: Secondary | ICD-10-CM | POA: Diagnosis not present

## 2011-08-28 DIAGNOSIS — R3915 Urgency of urination: Secondary | ICD-10-CM | POA: Diagnosis not present

## 2011-08-28 DIAGNOSIS — R350 Frequency of micturition: Secondary | ICD-10-CM | POA: Diagnosis not present

## 2011-09-04 DIAGNOSIS — L299 Pruritus, unspecified: Secondary | ICD-10-CM | POA: Diagnosis not present

## 2011-09-17 DIAGNOSIS — K117 Disturbances of salivary secretion: Secondary | ICD-10-CM | POA: Diagnosis not present

## 2011-09-17 DIAGNOSIS — I10 Essential (primary) hypertension: Secondary | ICD-10-CM | POA: Diagnosis not present

## 2011-09-17 DIAGNOSIS — J449 Chronic obstructive pulmonary disease, unspecified: Secondary | ICD-10-CM | POA: Diagnosis not present

## 2011-09-24 DIAGNOSIS — H2510 Age-related nuclear cataract, unspecified eye: Secondary | ICD-10-CM | POA: Diagnosis not present

## 2011-09-25 DIAGNOSIS — R350 Frequency of micturition: Secondary | ICD-10-CM | POA: Diagnosis not present

## 2011-09-25 DIAGNOSIS — R3915 Urgency of urination: Secondary | ICD-10-CM | POA: Diagnosis not present

## 2011-09-25 DIAGNOSIS — N3941 Urge incontinence: Secondary | ICD-10-CM | POA: Diagnosis not present

## 2011-10-23 DIAGNOSIS — R3915 Urgency of urination: Secondary | ICD-10-CM | POA: Diagnosis not present

## 2011-11-20 DIAGNOSIS — R3915 Urgency of urination: Secondary | ICD-10-CM | POA: Diagnosis not present

## 2011-11-20 DIAGNOSIS — R350 Frequency of micturition: Secondary | ICD-10-CM | POA: Diagnosis not present

## 2011-11-20 DIAGNOSIS — N3941 Urge incontinence: Secondary | ICD-10-CM | POA: Diagnosis not present

## 2011-12-15 DIAGNOSIS — J449 Chronic obstructive pulmonary disease, unspecified: Secondary | ICD-10-CM | POA: Diagnosis not present

## 2011-12-15 DIAGNOSIS — K5900 Constipation, unspecified: Secondary | ICD-10-CM | POA: Diagnosis not present

## 2011-12-15 DIAGNOSIS — G5600 Carpal tunnel syndrome, unspecified upper limb: Secondary | ICD-10-CM | POA: Diagnosis not present

## 2011-12-15 DIAGNOSIS — I10 Essential (primary) hypertension: Secondary | ICD-10-CM | POA: Diagnosis not present

## 2011-12-15 DIAGNOSIS — E039 Hypothyroidism, unspecified: Secondary | ICD-10-CM | POA: Diagnosis not present

## 2011-12-16 ENCOUNTER — Other Ambulatory Visit: Payer: Self-pay

## 2011-12-16 DIAGNOSIS — I6529 Occlusion and stenosis of unspecified carotid artery: Secondary | ICD-10-CM

## 2011-12-16 DIAGNOSIS — I739 Peripheral vascular disease, unspecified: Secondary | ICD-10-CM

## 2011-12-22 DIAGNOSIS — R350 Frequency of micturition: Secondary | ICD-10-CM | POA: Diagnosis not present

## 2011-12-22 DIAGNOSIS — R3915 Urgency of urination: Secondary | ICD-10-CM | POA: Diagnosis not present

## 2012-01-07 DIAGNOSIS — H2510 Age-related nuclear cataract, unspecified eye: Secondary | ICD-10-CM | POA: Diagnosis not present

## 2012-01-13 ENCOUNTER — Encounter: Payer: Self-pay | Admitting: Neurosurgery

## 2012-01-14 ENCOUNTER — Other Ambulatory Visit: Payer: Medicare Other

## 2012-01-14 ENCOUNTER — Ambulatory Visit (INDEPENDENT_AMBULATORY_CARE_PROVIDER_SITE_OTHER): Payer: Medicare Other | Admitting: Vascular Surgery

## 2012-01-14 ENCOUNTER — Encounter: Payer: Self-pay | Admitting: Neurosurgery

## 2012-01-14 ENCOUNTER — Ambulatory Visit: Payer: Medicare Other | Admitting: Neurosurgery

## 2012-01-14 ENCOUNTER — Ambulatory Visit (INDEPENDENT_AMBULATORY_CARE_PROVIDER_SITE_OTHER): Payer: Medicare Other | Admitting: Neurosurgery

## 2012-01-14 VITALS — BP 172/74 | HR 62 | Resp 16 | Ht 65.0 in | Wt 146.0 lb

## 2012-01-14 DIAGNOSIS — R200 Anesthesia of skin: Secondary | ICD-10-CM | POA: Insufficient documentation

## 2012-01-14 DIAGNOSIS — Z48812 Encounter for surgical aftercare following surgery on the circulatory system: Secondary | ICD-10-CM

## 2012-01-14 DIAGNOSIS — I6529 Occlusion and stenosis of unspecified carotid artery: Secondary | ICD-10-CM

## 2012-01-14 DIAGNOSIS — I739 Peripheral vascular disease, unspecified: Secondary | ICD-10-CM | POA: Diagnosis not present

## 2012-01-14 DIAGNOSIS — R209 Unspecified disturbances of skin sensation: Secondary | ICD-10-CM | POA: Diagnosis not present

## 2012-01-14 NOTE — Progress Notes (Signed)
Carotid duplex performed @ [facility] 01/14/2012

## 2012-01-14 NOTE — Addendum Note (Signed)
Addended by: Sharee Pimple on: 01/14/2012 02:56 PM   Modules accepted: Orders

## 2012-01-14 NOTE — Progress Notes (Signed)
Ankle brachial index performed @ [facility] 01/14/2012

## 2012-01-14 NOTE — Progress Notes (Signed)
[facility] Carotid Office Note  CC: Annual carotid and ABIs surveillance Referring Physician: Edilia Bo  History of Present Illness: 78 year old female patient of Dr. Edilia Bo who has complaints of increased lower extremity calf pain with walking. She denies rest pain or open ulcerations. The patient also has a history of a left CEA in 2009. The patient denies signs or symptoms of CVA, TIA, amaurosis fugax or any neural deficit.  Past Medical History  Diagnosis Date  . Personality change due to conditions classified elsewhere   . Carpal tunnel syndrome   . Hypertension   . Peripheral vascular disease   . Carotid artery occlusion   . Unspecified pruritic disorder     ROS: [x]  Positive   [ ]  Denies    General: [ ]  Weight loss, [ ]  Fever, [ ]  chills Neurologic: [ ]  Dizziness, [ ]  Blackouts, [ ]  Seizure [ ]  Stroke, [ ]  "Mini stroke", [ ]  Slurred speech, [ ]  Temporary blindness; [ x] weakness in arms or legs, [ ]  Hoarseness Cardiac: [ ]  Chest pain/pressure, [ ]  Shortness of breath at rest [x ] Shortness of breath with exertion, [ ]  Atrial fibrillation or irregular heartbeat Vascular: [ ]  Pain in legs with walking, [ ]  Pain in legs at rest, [ ]  Pain in legs at night,  [ ]  Non-healing ulcer, [ ]  Blood clot in vein/DVT,   Pulmonary: [ ]  Home oxygen, [ ]  Productive cough, [ ]  Coughing up blood, [ ]  Asthma,  [ ]  Wheezing Musculoskeletal:  [ ]  Arthritis, [ ]  Low back pain, [ ]  Joint pain Hematologic: [ ]  Easy Bruising, [ ]  Anemia; [ ]  Hepatitis Gastrointestinal: [ ]  Blood in stool, [ ]  Gastroesophageal Reflux/heartburn, [ ]  Trouble swallowing Urinary: [ ]  chronic Kidney disease, [ ]  on HD - [ ]  MWF or [ ]  TTHS, [ ]  Burning with urination, [ ]  Difficulty urinating Skin: [ ]  Rashes, [ ]  Wounds Psychological: [ ]  Anxiety, [ ]  Depression   Social History History  Substance Use Topics  . Smoking status: Never Smoker   . Smokeless tobacco: Not on file  . Alcohol  Use: No    Family History No family history on file.  No Known Allergies  Current Outpatient Prescriptions  Medication Sig Dispense Refill  . acetaminophen (TYLENOL) 325 MG tablet Take 325 mg by mouth 2 (two) times daily. Take 2 tablets twice daily for pain relief       . alum & mag hydroxide-simeth (MYLANTA) 200-200-20 MG/5ML suspension Take 5 mLs by mouth at bedtime. Give 150 mL qhs       . AMLODIPINE BESYLATE PO Take 5 mg by mouth daily. Take one daily to treat HTN       . Artificial Tear (GENTEAL) GEL Apply to eye 2 (two) times daily. Apply twice daily to both eyes to help dry eyes       . clopidogrel (PLAVIX) 75 MG tablet Take 75 mg by mouth daily. Take one daily to reduce stroke risk       . esomeprazole (NEXIUM) 40 MG capsule Take 40 mg by mouth 2 (two) times daily. One twice daily to reduce stomach acid       . eszopiclone (LUNESTA) 2 MG TABS Take 2 mg by mouth at bedtime. Take immediately before bedtime       . glycerin adult (GLYCERIN ADULT) 2 G SUPP Place 1 suppository rectally once. Insert 1 PR daily to aid BM       .  hydrocortisone (PROCTOZONE-HC) 2.5 % rectal cream Place 1 application rectally 2 (two) times daily. Apply twice daily to rectum as needed       . hyoscyamine (LEVSIN) 0.125 MG/5ML ELIX Take 125 mcg by mouth 3 (three) times daily as needed. One as needed up to 3 times in 24 hours to help bowel discomfort       . meclizine (ANTIVERT) 25 MG tablet Take 25 mg by mouth every 6 (six) hours as needed. Take one tablet every 6 hours prn for nausea and dizziness       . nystatin (MYCOSTATIN) cream Apply 1 application topically 2 (two) times daily. Apply twice daily to rectum as needed         Physical Examination  Filed Vitals:   01/14/12 1129  BP: 172/74  Pulse: 62  Resp:     Body mass index is 24.30 kg/(m^2).  General:  WDWN in NAD Gait: Normal HEENT: WNL Eyes: Pupils equal Pulmonary: normal non-labored breathing , without Rales, rhonchi,  wheezing Cardiac:  RRR, without  Murmurs, rubs or gallops; Abdomen: soft, NT, no masses Skin: no rashes, ulcers noted  Vascular Exam Pulses: Femoral pulses palpable bilaterally lower extremity pulses are not palpable, radial pulses are 3+ all Carotid bruits: Carotid pulses to auscultation no bruits are heard Extremities without ischemic changes, no Gangrene , no cellulitis; no open wounds;  Musculoskeletal: no muscle wasting or atrophy   Neurologic: A&O X 3; Appropriate Affect ; SENSATION: normal; MOTOR FUNCTION:  moving all extremities equally. Speech is fluent/normal  Non-Invasive Vascular Imaging CAROTID DUPLEX 01/14/2012  Right ICA 20 - 39 % stenosis Left ICA 0 - 19% stenosis ABIs today are 0.68 and biphasic on the right, 0.61 and biphasic on the left, this was discussed with Dr. Edilia Bo and compared to previous study which is stable since 2011  ASSESSMENT/PLAN: Asymptomatic carotid patient with some increased lower extremity calf claudication with ambulation. The patient and her family member understand that for further intervention or diagnostics a dye study would be needed and the patient has declined. Dr. Adele Dan recommendation is the patient return in 6 months followup with him with repeat ABIs. She'll followup with me in one year for her carotids. Otherwise the patient's questions were encouraged and answered, they are in agreement with this plan.  Lauree Chandler ANP   Clinic MD: Edilia Bo

## 2012-01-15 DIAGNOSIS — R350 Frequency of micturition: Secondary | ICD-10-CM | POA: Diagnosis not present

## 2012-01-15 DIAGNOSIS — R3915 Urgency of urination: Secondary | ICD-10-CM | POA: Diagnosis not present

## 2012-01-21 ENCOUNTER — Other Ambulatory Visit: Payer: Medicare Other

## 2012-01-21 ENCOUNTER — Ambulatory Visit: Payer: Medicare Other | Admitting: Neurosurgery

## 2012-01-22 DIAGNOSIS — L84 Corns and callosities: Secondary | ICD-10-CM | POA: Diagnosis not present

## 2012-02-05 DIAGNOSIS — R350 Frequency of micturition: Secondary | ICD-10-CM | POA: Diagnosis not present

## 2012-02-05 DIAGNOSIS — R3915 Urgency of urination: Secondary | ICD-10-CM | POA: Diagnosis not present

## 2012-02-05 DIAGNOSIS — N3941 Urge incontinence: Secondary | ICD-10-CM | POA: Diagnosis not present

## 2012-02-06 DIAGNOSIS — L84 Corns and callosities: Secondary | ICD-10-CM | POA: Diagnosis not present

## 2012-02-26 DIAGNOSIS — R350 Frequency of micturition: Secondary | ICD-10-CM | POA: Diagnosis not present

## 2012-02-26 DIAGNOSIS — R3915 Urgency of urination: Secondary | ICD-10-CM | POA: Diagnosis not present

## 2012-02-26 DIAGNOSIS — N3941 Urge incontinence: Secondary | ICD-10-CM | POA: Diagnosis not present

## 2012-03-02 DIAGNOSIS — Z23 Encounter for immunization: Secondary | ICD-10-CM | POA: Diagnosis not present

## 2012-03-03 DIAGNOSIS — G5600 Carpal tunnel syndrome, unspecified upper limb: Secondary | ICD-10-CM | POA: Diagnosis not present

## 2012-03-03 DIAGNOSIS — J449 Chronic obstructive pulmonary disease, unspecified: Secondary | ICD-10-CM | POA: Diagnosis not present

## 2012-03-03 DIAGNOSIS — G609 Hereditary and idiopathic neuropathy, unspecified: Secondary | ICD-10-CM | POA: Diagnosis not present

## 2012-03-09 DIAGNOSIS — L84 Corns and callosities: Secondary | ICD-10-CM | POA: Diagnosis not present

## 2012-03-18 DIAGNOSIS — R3915 Urgency of urination: Secondary | ICD-10-CM | POA: Diagnosis not present

## 2012-03-18 DIAGNOSIS — R350 Frequency of micturition: Secondary | ICD-10-CM | POA: Diagnosis not present

## 2012-04-05 DIAGNOSIS — G5600 Carpal tunnel syndrome, unspecified upper limb: Secondary | ICD-10-CM | POA: Diagnosis not present

## 2012-04-07 DIAGNOSIS — R350 Frequency of micturition: Secondary | ICD-10-CM | POA: Diagnosis not present

## 2012-04-07 DIAGNOSIS — R3915 Urgency of urination: Secondary | ICD-10-CM | POA: Diagnosis not present

## 2012-04-07 DIAGNOSIS — N3941 Urge incontinence: Secondary | ICD-10-CM | POA: Diagnosis not present

## 2012-04-08 DIAGNOSIS — N390 Urinary tract infection, site not specified: Secondary | ICD-10-CM | POA: Diagnosis not present

## 2012-04-26 DIAGNOSIS — N3941 Urge incontinence: Secondary | ICD-10-CM | POA: Diagnosis not present

## 2012-04-26 DIAGNOSIS — R3915 Urgency of urination: Secondary | ICD-10-CM | POA: Diagnosis not present

## 2012-04-26 DIAGNOSIS — R350 Frequency of micturition: Secondary | ICD-10-CM | POA: Diagnosis not present

## 2012-05-27 DIAGNOSIS — R350 Frequency of micturition: Secondary | ICD-10-CM | POA: Diagnosis not present

## 2012-05-27 DIAGNOSIS — N3941 Urge incontinence: Secondary | ICD-10-CM | POA: Diagnosis not present

## 2012-05-27 DIAGNOSIS — R3915 Urgency of urination: Secondary | ICD-10-CM | POA: Diagnosis not present

## 2012-06-02 DIAGNOSIS — G5600 Carpal tunnel syndrome, unspecified upper limb: Secondary | ICD-10-CM | POA: Diagnosis not present

## 2012-06-02 DIAGNOSIS — I10 Essential (primary) hypertension: Secondary | ICD-10-CM | POA: Diagnosis not present

## 2012-06-02 DIAGNOSIS — R32 Unspecified urinary incontinence: Secondary | ICD-10-CM | POA: Diagnosis not present

## 2012-06-02 DIAGNOSIS — G609 Hereditary and idiopathic neuropathy, unspecified: Secondary | ICD-10-CM | POA: Diagnosis not present

## 2012-06-17 DIAGNOSIS — N3941 Urge incontinence: Secondary | ICD-10-CM | POA: Diagnosis not present

## 2012-06-17 DIAGNOSIS — R350 Frequency of micturition: Secondary | ICD-10-CM | POA: Diagnosis not present

## 2012-06-17 DIAGNOSIS — R3915 Urgency of urination: Secondary | ICD-10-CM | POA: Diagnosis not present

## 2012-06-24 DIAGNOSIS — N3944 Nocturnal enuresis: Secondary | ICD-10-CM | POA: Diagnosis not present

## 2012-06-24 DIAGNOSIS — N3946 Mixed incontinence: Secondary | ICD-10-CM | POA: Diagnosis not present

## 2012-06-25 DIAGNOSIS — N3289 Other specified disorders of bladder: Secondary | ICD-10-CM | POA: Diagnosis not present

## 2012-06-25 DIAGNOSIS — N390 Urinary tract infection, site not specified: Secondary | ICD-10-CM | POA: Diagnosis not present

## 2012-06-25 DIAGNOSIS — Z87440 Personal history of urinary (tract) infections: Secondary | ICD-10-CM | POA: Diagnosis not present

## 2012-07-20 ENCOUNTER — Encounter: Payer: Self-pay | Admitting: Vascular Surgery

## 2012-07-21 ENCOUNTER — Encounter: Payer: Self-pay | Admitting: Vascular Surgery

## 2012-07-21 ENCOUNTER — Other Ambulatory Visit (INDEPENDENT_AMBULATORY_CARE_PROVIDER_SITE_OTHER): Payer: Medicare Other | Admitting: *Deleted

## 2012-07-21 ENCOUNTER — Ambulatory Visit (INDEPENDENT_AMBULATORY_CARE_PROVIDER_SITE_OTHER): Payer: Medicare Other | Admitting: Vascular Surgery

## 2012-07-21 ENCOUNTER — Other Ambulatory Visit: Payer: Self-pay | Admitting: *Deleted

## 2012-07-21 ENCOUNTER — Encounter (INDEPENDENT_AMBULATORY_CARE_PROVIDER_SITE_OTHER): Payer: Medicare Other | Admitting: *Deleted

## 2012-07-21 VITALS — BP 172/57 | HR 72 | Ht 65.0 in | Wt 145.0 lb

## 2012-07-21 DIAGNOSIS — Z48812 Encounter for surgical aftercare following surgery on the circulatory system: Secondary | ICD-10-CM

## 2012-07-21 DIAGNOSIS — I739 Peripheral vascular disease, unspecified: Secondary | ICD-10-CM | POA: Diagnosis not present

## 2012-07-21 DIAGNOSIS — I6529 Occlusion and stenosis of unspecified carotid artery: Secondary | ICD-10-CM | POA: Diagnosis not present

## 2012-07-21 DIAGNOSIS — R200 Anesthesia of skin: Secondary | ICD-10-CM

## 2012-07-21 NOTE — Progress Notes (Signed)
Established  Patient CC: bilateral leg pain in dependent position S/P left CEA 11/22/2007  History of Present Illness  Tara Walters is a 79 y.o. female who we are seeing back for F/U with C/O pain in both her legs. She states her legs hurt when they are in a dependent position. She states the pain is relieved with elevation. She denies nonhealing ulcers, night or rest pain.She does not ambulate well due to unsteadiness.   Patient has had previous Left CEA. Patient has Negative history of TIA or stroke symptom.  The patient denies amaurosis fugax or monocular blindness.  The patient  denies facial drooping.  Pt. denies hemiplegia.  The patient denies receptive or expressive aphasia.  Pt. denies weakness in BUE/BLE  Non-Invasive Vascular Imaging CAROTID DUPLEX 07/21/2012 Right ICA 20 - 39 % stenosis Left ICA no significant stenosis: CEA site open  These findings are Unchanged from previous exam  ABI's Right 0.73     Left    0.58 Stable from previous exam  Past Medical History  Diagnosis Date  . Personality change due to conditions classified elsewhere   . Carpal tunnel syndrome   . Hypertension   . Peripheral vascular disease   . Carotid artery occlusion   . Unspecified pruritic disorder     Social History History  Substance Use Topics  . Smoking status: Never Smoker   . Smokeless tobacco: Not on file  . Alcohol Use: No    Family History History reviewed. No pertinent family history.  Review of Systems : [x]  Positive   [ ]  Denies  General:[ ]  Weight loss,  [ ]  Weight gain, [ ]  Loss of appetite, [ ]  Fever, [ ]  chills  Neurologic: [x ] Dizziness, [ ]  Blackouts, [ ]  Headaches, [ ]  Seizure [ ]  Stroke, [ ]  "Mini stroke", [ ]  Slurred speech, [ ]  Temporary blindness;  [ ] weakness,  Ear/Nose/Throat: [x ] Change in hearing, [ ]  Nose bleeds, [ ]  Hoarseness  Vascular:[ ]  Pain in legs with walking, [ ]  Pain in feet while lying flat , [ ]   Non-healing ulcer, [ ]  Blood clot in  vein,    Pulmonary: [ ]  Home oxygen, [ ]   Productive cough, [ ]  Bronchitis, [ ]  Coughing up blood,  [ ]  Asthma, [ ]  Wheezing  Musculoskeletal:  [ ]  Arthritis, [ ]  Joint pain, [ ]  low back pain  Cardiac: [ ]  Chest pain, [ ]  Shortness of breath when lying flat, [ ]  Shortness of breath with exertion, [ ]  Palpitations, [ ]  Heart murmur, [ ]   Atrial fibrillation  Hematologic:[ ]  Easy Bruising, [ ]  Anemia; [ ]  Hepatitis  Psychiatric: [ ]   Depression, [ ]  Anxiety   Gastrointestinal: [ ]  Black stool, [ ]  Blood in stool, [ ]  Peptic ulcer disease,  [ ]  Gastroesophageal Reflux, [ ]  Trouble swallowing, [ ]  Diarrhea, [ ]  Constipation  Urinary: [ ]  chronic Kidney disease, [ ]  on HD, [ ]  Burning with urination, [ ]  Frequent urination, [ ]  Difficulty urinating;   Skin: [ ]  Rashes, [ ]  Wounds   No Known Allergies  Current Outpatient Prescriptions  Medication Sig Dispense Refill  . acetaminophen (TYLENOL) 325 MG tablet Take 325 mg by mouth 2 (two) times daily. Take 2 tablets twice daily for pain relief       . alum & mag hydroxide-simeth (MYLANTA) 200-200-20 MG/5ML suspension Take 5 mLs by mouth at bedtime. Give 150 mL qhs       .  Arnica 20 % TINC Apply topically as needed.      . Artificial Tear (GENTEAL) GEL Apply to eye 2 (two) times daily. Apply twice daily to both eyes to help dry eyes       . benzocaine (HURRICAINE) 20 % GEL Use as directed 1 application in the mouth or throat 2 (two) times daily as needed.      . ciprofloxacin (CIPRO) 250 MG tablet Take 1 tablet by mouth 2 (two) times daily.      . clopidogrel (PLAVIX) 75 MG tablet Take 75 mg by mouth daily. Take one daily to reduce stroke risk       . esomeprazole (NEXIUM) 40 MG capsule Take 40 mg by mouth 2 (two) times daily. One twice daily to reduce stomach acid       . eszopiclone (LUNESTA) 2 MG TABS Take 2 mg by mouth at bedtime. Take immediately before bedtime       . glycerin adult (GLYCERIN ADULT) 2 G SUPP Place 1 suppository rectally  once. Insert 1 PR daily to aid BM       . hydrocortisone (PROCTOZONE-HC) 2.5 % rectal cream Place 1 application rectally 2 (two) times daily. Apply twice daily to rectum as needed       . loratadine (CLARITIN) 10 MG tablet Take 10 mg by mouth daily.      . meclizine (ANTIVERT) 25 MG tablet Take 25 mg by mouth every 6 (six) hours as needed. Take one tablet every 6 hours prn for nausea and dizziness       . MYRBETRIQ 50 MG TB24 Take 1 tablet by mouth daily.      Marland Kitchen senna (SENOKOT) 8.6 MG tablet Take 1 tablet by mouth daily. 2 tablet q hs      . Vitamins-Lipotropics (B-100 PO) Take 1 tablet by mouth daily.      Marland Kitchen AMLODIPINE BESYLATE PO Take 5 mg by mouth daily. Take one daily to treat HTN       . hyoscyamine (LEVSIN) 0.125 MG/5ML ELIX Take 125 mcg by mouth 3 (three) times daily as needed. One as needed up to 3 times in 24 hours to help bowel discomfort       . nystatin (MYCOSTATIN) cream Apply 1 application topically 2 (two) times daily. Apply twice daily to rectum as needed        No current facility-administered medications for this visit.    Physical Examination  Filed Vitals:   07/21/12 1537  BP: 172/57  Pulse:     General: WDWN female in NAD GAIT: shuffling Eyes: PERRLA Pulmonary:  No labored breathing Cardiac: regular Rhythm ,  Vascular: monophasic DP/PT/peroneal on left Monophasic PT/Peroneal on right DP absent on right    Neurologic: A&O X 3; Appropriate Affect ; SENSATION ;normal; MOTOR FUNCTION: normal 5/5 strength in all tested muscle groups Speech is normal  Assessment: Tara Walters is a 79 y.o. female who come in today for F/U pain in her legs - she denies symptoms of claudication, night or rest pain. As this pain occurs in a dependent position and is relieved with elevation, it may be due to venous disease Carotid studies are stable and pt has no stroke or TIA symptoms. Plan: Follow-up in 1 years with Carotid Duplex scan and ABI's  No need for further intervention at  this time Clinic MD: CSD  Agree with above. I will see her back in one year with carotid duplex scan and ABIs. She knows to call  sooner if she has problems.  Cari Caraway Beeper [phone] 07/21/2012

## 2012-07-22 NOTE — Addendum Note (Signed)
Addended by: Sharee Pimple on: 07/22/2012 07:54 AM   Modules accepted: Orders

## 2012-07-23 ENCOUNTER — Other Ambulatory Visit: Payer: Self-pay | Admitting: *Deleted

## 2012-07-23 ENCOUNTER — Other Ambulatory Visit: Payer: Self-pay

## 2012-07-23 DIAGNOSIS — I6529 Occlusion and stenosis of unspecified carotid artery: Secondary | ICD-10-CM

## 2012-07-23 DIAGNOSIS — Z48812 Encounter for surgical aftercare following surgery on the circulatory system: Secondary | ICD-10-CM

## 2012-07-28 DIAGNOSIS — N3941 Urge incontinence: Secondary | ICD-10-CM | POA: Diagnosis not present

## 2012-08-05 DIAGNOSIS — M25579 Pain in unspecified ankle and joints of unspecified foot: Secondary | ICD-10-CM | POA: Diagnosis not present

## 2012-08-05 DIAGNOSIS — M79609 Pain in unspecified limb: Secondary | ICD-10-CM | POA: Diagnosis not present

## 2012-08-05 DIAGNOSIS — R609 Edema, unspecified: Secondary | ICD-10-CM | POA: Diagnosis not present

## 2012-08-17 DIAGNOSIS — M79609 Pain in unspecified limb: Secondary | ICD-10-CM | POA: Diagnosis not present

## 2012-08-17 DIAGNOSIS — L608 Other nail disorders: Secondary | ICD-10-CM | POA: Diagnosis not present

## 2012-08-17 DIAGNOSIS — M7989 Other specified soft tissue disorders: Secondary | ICD-10-CM | POA: Diagnosis not present

## 2012-08-18 DIAGNOSIS — R350 Frequency of micturition: Secondary | ICD-10-CM | POA: Diagnosis not present

## 2012-08-18 DIAGNOSIS — N3941 Urge incontinence: Secondary | ICD-10-CM | POA: Diagnosis not present

## 2012-08-18 DIAGNOSIS — R3915 Urgency of urination: Secondary | ICD-10-CM | POA: Diagnosis not present

## 2012-08-19 DIAGNOSIS — F411 Generalized anxiety disorder: Secondary | ICD-10-CM | POA: Diagnosis not present

## 2012-08-19 DIAGNOSIS — E039 Hypothyroidism, unspecified: Secondary | ICD-10-CM | POA: Diagnosis not present

## 2012-08-19 DIAGNOSIS — I10 Essential (primary) hypertension: Secondary | ICD-10-CM | POA: Diagnosis not present

## 2012-08-30 ENCOUNTER — Non-Acute Institutional Stay: Payer: Medicare Other | Admitting: Internal Medicine

## 2012-08-30 VITALS — BP 130/70 | HR 60 | Temp 97.9°F | Resp 14 | Ht 64.5 in | Wt 150.0 lb

## 2012-08-30 DIAGNOSIS — I739 Peripheral vascular disease, unspecified: Secondary | ICD-10-CM

## 2012-08-30 DIAGNOSIS — M25572 Pain in left ankle and joints of left foot: Secondary | ICD-10-CM

## 2012-08-30 DIAGNOSIS — G609 Hereditary and idiopathic neuropathy, unspecified: Secondary | ICD-10-CM | POA: Diagnosis not present

## 2012-08-30 DIAGNOSIS — N952 Postmenopausal atrophic vaginitis: Secondary | ICD-10-CM

## 2012-08-30 DIAGNOSIS — E039 Hypothyroidism, unspecified: Secondary | ICD-10-CM

## 2012-08-30 DIAGNOSIS — K219 Gastro-esophageal reflux disease without esophagitis: Secondary | ICD-10-CM

## 2012-08-30 DIAGNOSIS — I6529 Occlusion and stenosis of unspecified carotid artery: Secondary | ICD-10-CM

## 2012-08-30 DIAGNOSIS — M25579 Pain in unspecified ankle and joints of unspecified foot: Secondary | ICD-10-CM

## 2012-08-30 DIAGNOSIS — I10 Essential (primary) hypertension: Secondary | ICD-10-CM

## 2012-08-30 DIAGNOSIS — G629 Polyneuropathy, unspecified: Secondary | ICD-10-CM

## 2012-08-30 DIAGNOSIS — R269 Unspecified abnormalities of gait and mobility: Secondary | ICD-10-CM

## 2012-08-30 DIAGNOSIS — F411 Generalized anxiety disorder: Secondary | ICD-10-CM

## 2012-08-30 DIAGNOSIS — L659 Nonscarring hair loss, unspecified: Secondary | ICD-10-CM

## 2012-08-30 DIAGNOSIS — R32 Unspecified urinary incontinence: Secondary | ICD-10-CM

## 2012-08-30 DIAGNOSIS — F419 Anxiety disorder, unspecified: Secondary | ICD-10-CM

## 2012-09-08 DIAGNOSIS — R3915 Urgency of urination: Secondary | ICD-10-CM | POA: Diagnosis not present

## 2012-09-08 DIAGNOSIS — R350 Frequency of micturition: Secondary | ICD-10-CM | POA: Diagnosis not present

## 2012-09-08 DIAGNOSIS — N3941 Urge incontinence: Secondary | ICD-10-CM | POA: Diagnosis not present

## 2012-09-29 DIAGNOSIS — R3915 Urgency of urination: Secondary | ICD-10-CM | POA: Diagnosis not present

## 2012-09-29 DIAGNOSIS — R350 Frequency of micturition: Secondary | ICD-10-CM | POA: Diagnosis not present

## 2012-09-29 DIAGNOSIS — N3941 Urge incontinence: Secondary | ICD-10-CM | POA: Diagnosis not present

## 2012-10-14 DIAGNOSIS — H25019 Cortical age-related cataract, unspecified eye: Secondary | ICD-10-CM | POA: Diagnosis not present

## 2012-10-14 DIAGNOSIS — H2510 Age-related nuclear cataract, unspecified eye: Secondary | ICD-10-CM | POA: Diagnosis not present

## 2012-10-14 DIAGNOSIS — H35379 Puckering of macula, unspecified eye: Secondary | ICD-10-CM | POA: Diagnosis not present

## 2012-10-20 DIAGNOSIS — R3915 Urgency of urination: Secondary | ICD-10-CM | POA: Diagnosis not present

## 2012-10-20 DIAGNOSIS — N3941 Urge incontinence: Secondary | ICD-10-CM | POA: Diagnosis not present

## 2012-10-20 DIAGNOSIS — R350 Frequency of micturition: Secondary | ICD-10-CM | POA: Diagnosis not present

## 2012-11-08 DIAGNOSIS — R3915 Urgency of urination: Secondary | ICD-10-CM | POA: Diagnosis not present

## 2012-11-08 DIAGNOSIS — R350 Frequency of micturition: Secondary | ICD-10-CM | POA: Diagnosis not present

## 2012-11-08 DIAGNOSIS — N3941 Urge incontinence: Secondary | ICD-10-CM | POA: Diagnosis not present

## 2012-11-10 DIAGNOSIS — H2589 Other age-related cataract: Secondary | ICD-10-CM | POA: Diagnosis not present

## 2012-11-10 DIAGNOSIS — H25019 Cortical age-related cataract, unspecified eye: Secondary | ICD-10-CM | POA: Diagnosis not present

## 2012-11-10 DIAGNOSIS — H2510 Age-related nuclear cataract, unspecified eye: Secondary | ICD-10-CM | POA: Diagnosis not present

## 2012-11-15 ENCOUNTER — Other Ambulatory Visit: Payer: Self-pay | Admitting: *Deleted

## 2012-11-15 MED ORDER — ESZOPICLONE 2 MG PO TABS: 2.0000 mg | ORAL_TABLET | Freq: Every day | ORAL | Status: DC

## 2012-11-23 HISTORY — PX: CATARACT EXTRACTION W/ INTRAOCULAR LENS IMPLANT: SHX1309

## 2012-11-29 DIAGNOSIS — M25579 Pain in unspecified ankle and joints of unspecified foot: Secondary | ICD-10-CM | POA: Diagnosis not present

## 2012-12-01 ENCOUNTER — Encounter: Payer: Self-pay | Admitting: Geriatric Medicine

## 2012-12-08 ENCOUNTER — Non-Acute Institutional Stay: Payer: Medicare Other | Admitting: Geriatric Medicine

## 2012-12-08 ENCOUNTER — Encounter: Payer: Self-pay | Admitting: Geriatric Medicine

## 2012-12-08 VITALS — BP 120/64 | HR 62 | Ht 65.0 in | Wt 149.0 lb

## 2012-12-08 DIAGNOSIS — M25571 Pain in right ankle and joints of right foot: Secondary | ICD-10-CM

## 2012-12-08 DIAGNOSIS — K219 Gastro-esophageal reflux disease without esophagitis: Secondary | ICD-10-CM | POA: Diagnosis not present

## 2012-12-08 DIAGNOSIS — I6529 Occlusion and stenosis of unspecified carotid artery: Secondary | ICD-10-CM

## 2012-12-08 DIAGNOSIS — M25579 Pain in unspecified ankle and joints of unspecified foot: Secondary | ICD-10-CM

## 2012-12-08 DIAGNOSIS — H25013 Cortical age-related cataract, bilateral: Secondary | ICD-10-CM

## 2012-12-08 DIAGNOSIS — H25019 Cortical age-related cataract, unspecified eye: Secondary | ICD-10-CM

## 2012-12-08 DIAGNOSIS — I739 Peripheral vascular disease, unspecified: Secondary | ICD-10-CM

## 2012-12-08 DIAGNOSIS — G629 Polyneuropathy, unspecified: Secondary | ICD-10-CM

## 2012-12-08 DIAGNOSIS — G4700 Insomnia, unspecified: Secondary | ICD-10-CM | POA: Diagnosis not present

## 2012-12-08 DIAGNOSIS — R32 Unspecified urinary incontinence: Secondary | ICD-10-CM

## 2012-12-08 DIAGNOSIS — I6522 Occlusion and stenosis of left carotid artery: Secondary | ICD-10-CM

## 2012-12-08 DIAGNOSIS — G5600 Carpal tunnel syndrome, unspecified upper limb: Secondary | ICD-10-CM | POA: Diagnosis not present

## 2012-12-08 DIAGNOSIS — G609 Hereditary and idiopathic neuropathy, unspecified: Secondary | ICD-10-CM | POA: Diagnosis not present

## 2012-12-08 DIAGNOSIS — I10 Essential (primary) hypertension: Secondary | ICD-10-CM

## 2012-12-08 MED ORDER — ESZOPICLONE 2 MG PO TABS: 2.0000 mg | ORAL_TABLET | Freq: Every day | ORAL | Status: DC

## 2012-12-08 NOTE — Progress Notes (Signed)
Patient ID: Tara Walters, female   DOB: [birth year], 79 y.o.   MRN: [id number] [facility] [phone]  Chief Complaint  Patient presents with  . Medical Managment of Chronic Issues    blood pressur, GERD, peripheral neuropathy    HPI: This is a 79 y.o. female resident of [facility] weekly blood pressure readings have been satisfactory. Community,  Assisted Living Skilled section evaluated today for management of ongoing medical issues.  Review of record shows weekly blood pressure readings, monthly weight monitoring have been satisfactory.    Patient reports few symptoms related to her GERD, dietary staff is helping her with food choices. Abnormal sensations in the back of her lower legs as well as bilateral foot numbness. Continues to have mild right foot pain at times. This does not really interfere with her walking. Patient has been evaluated by ophthalmology in the last few months and underwent a right cataract extraction and intraocular lens implant to 3 weeks ago. Patient notes that her vision is greatly improved she is very happy with this result she is now able to read much better. She is to be reevaluated by ophthalmology next week or so and plans to have left cataract surgery scheduled soon.    No Known Allergies   Medication List       This list is accurate as of: 12/08/12 11:59 PM.  Always use your most recent med list.               acetaminophen 325 MG tablet  Commonly known as:  TYLENOL  Take 325 mg by mouth 2 (two) times daily. Take 2 tablets twice daily for pain relief     AMLODIPINE BESYLATE PO  Take 5 mg by mouth daily. Take one daily to treat HTN     Arnica 20 % Tinc  Apply topically as needed.     B-100 PO  Take 1 tablet by mouth daily.     benzocaine 20 % Gel  Commonly known as:  HURRICAINE  Use as directed in the mouth or throat. Apply lower lip daily as needed     clopidogrel 75 MG tablet  Commonly known as:  PLAVIX   Take 75 mg by mouth daily. Take one daily to reduce stroke risk     esomeprazole 40 MG capsule  Commonly known as:  NEXIUM  Take 40 mg by mouth 2 (two) times daily. One twice daily to reduce stomach acid     eszopiclone 2 MG Tabs  Commonly known as:  LUNESTA  Take 1 tablet (2 mg total) by mouth at bedtime. Take immediately before bedtime     GENTEAL Gel  Apply to eye 2 (two) times daily. Apply twice daily to both eyes to help dry eyes     loratadine 10 MG tablet  Commonly known as:  CLARITIN  Take 10 mg by mouth daily.     meclizine 25 MG tablet  Commonly known as:  ANTIVERT  Take 25 mg by mouth every 6 (six) hours as needed. Take one tablet every 6 hours prn for nausea and dizziness     MYLANTA 200-200-20 MG/5ML suspension  Generic drug:  alum & mag hydroxide-simeth  Take 5 mLs by mouth at bedtime. Give 150 mL qhs     MYRBETRIQ 50 MG Tb24  Generic drug:  mirabegron ER  Take 2 tablets by mouth daily. Take 2 +100mg  daily     senna 8.6 MG tablet  Commonly known as:  [employer]  Take 1 tablet by mouth daily. 2 tablet q hs       DATA REVIEWED   Radiologic Exams: [facility]   08/05/2012 right foot x-ray: Mild hallux valgus deformity of foot. Mild osteoarthritis at the base of the toes. No acute fracture or other injury. No evidence for osteomyelitis   Cardiovascular Exams:  06/2012 Carotid Doppler / ABI exam results reviewed  08/05/2012 right lower extremity venous Doppler exam: No evidence of DVT   Laboratory Studies: [facility] lab, external  07/15/2011 Vitamin B12 474, Folate 14.0  03/03/2012 CMP: Sodium 137, Potassium 4.5, glucose 67, BUN 21, Creatinine 0.72   CBC Wbc 3.5, Rbc 4.36, Hgb 12.2, Hct 35.9   TSH 2.659  04/08/2012 Urine negative   Urine culture no growth   Vitamin B -12 420  08/19/2012: WBC 4.9, hemoglobin 11.6, hematocrit 34.7, platelets 220   Glucose 79, BUN 23, creatinine 0.84, sodium 138, potassium 4.4. Protein/LFTs WNL   TSH 2.74   Review of  Systems  DATA OBTAINED: from patient,  medical record GENERAL: Feels well   No fevers, fatigue, change in appetite or weight SKIN: No itch, rash or open wounds EYES: No eye pain, dryness or itching  SEE HPI - vision better after cataract surgery on the right EARS: No earache, tinnitus, change in hearing NOSE: No congestion, drainage or bleeding MOUTH/THROAT: No mouth or tooth pain  No sore throat No difficulty chewing or swallowing RESPIRATORY: No cough, wheezing, SOB CARDIAC: No chest pain, palpitations  No edema. GI: No abdominal pain  No N/V/D or constipation  No heartburn or reflux  GU: No dysuria. Chronic frequency  No change in urine volume or character  MUSCULOSKELETAL: No joint pain, swelling or stiffness  right foot pain No back pain  No muscle ache, pain, weakness  Gait is unsteady  No recent falls.  NEUROLOGIC: No dizziness, fainting, headache. Chronic bipedal numbness  No change in mental status.  PSYCHIATRIC: No feelings of anxiety, depression Sleeps well.     Physical Exam Filed Vitals:   12/08/12 1312  BP: 120/64  Pulse: 62  Height: 5\' 5"  (1.651 m)  Weight: 149 lb (67.586 kg)   Body mass index is 24.79 kg/(m^2).  GENERAL APPEARANCE: No acute distress, appropriately groomed, normal body habitus. Alert, pleasant, conversant. SKIN: No diaphoresis rash, unusual lesions, wounds HEAD: Normocephalic, atraumatic EYES: Conjunctiva/lids clear. Pupils round, reactive.  EARS: External exam WNL, Hearing grossly normal. NOSE: No deformity or discharge. MOUTH/THROAT: Lips w/o lesions. Oral mucosa, tongue moist, w/o lesion. Oropharynx w/o redness or lesions.  NECK: Supple, full ROM. No thyroid tenderness, enlargement or nodule LYMPHATICS: No head, neck or supraclavicular adenopathy RESPIRATORY: Breathing is even, unlabored. Lung sounds are clear and full.  CARDIOVASCULAR: Heart RRR. No murmur or extra heart sounds  EDEMA: No peripheral  edema. GASTROINTESTINAL: Abdomen is soft,  non-tender, not distended w/ normal bowel sounds.  MUSCULOSKELETAL: Moves all extremities with full ROM, movement is slow and somewhat stiff, normal strength and tone. Back is without kyphosis, scoliosis or spinal process tenderness. Gait is unsteady, better w/ walker NEUROLOGIC: Oriented to time, place, person. Speech clear, no tremor.  PSYCHIATRIC: Mood and affect appropriate to situation  ASSESSMENT/PLAN  Pain in joint, ankle and foot Venous Dopplers and negative for DVT, right foot x-ray negative for acute findings significant for mild osteoarthritis at the base of her toes. Patient continues to have mild pain in his foot at times. Specific interventions at this time.  Carpal tunnel syndrome No change in  discomfort patient's wrists and thumbs. She's not wearing the braces very often. Discussed and demonstrated some gentle hand and wrist stretches today that may help with decreasing symptoms.  GERD (gastroesophageal reflux disease) Reflux symptoms remain well controlled on b.i.d. dosing of Nexium. Dietary staff at [facility] have been helping her with her food choices to expand her diet repertoire as well as help her to avoid foods that exacerbate symptoms  Urinary incontinence No change in symptoms, patient continues interventions prescribed by Dr.MacDiarmid  Neuropathy, peripheral Patient complains of abnormal sensations extending from her back of her ankle up posteriorly to the midcalf. Continues to have bilateral foot numbness. Patient continues to ambulate with a walker daily though she is uncomfortable.  Discussed and demonstrated gentle ankle and foot stretches that may help with reducing symptoms.  HTN (hypertension) Blood pressure remains well controlled on current medications, recent lab satisfactory. Continue medications, monitor lab at intervals.  PVD (peripheral vascular disease) Most recent evaluation by Dr. Edilia Bo in February 2014. ABI on the right 0.73 left 0.58, he notes  these are stable readings. No change in therapy; patient continues on Plavix, is encouraged to walk daily. Patient will be evaluated yearly by Dr. Edilia Bo  Cataract cortical, senile Status post right cataract extraction/IOL implant July 2014. Vision much improved. Left eye surgery to be scheduled.    Follow up: 3 months Dr.Green, annual update.  [place] T.Gayla Benn, NP-C 12/08/2012

## 2012-12-09 ENCOUNTER — Encounter: Payer: Self-pay | Admitting: Geriatric Medicine

## 2012-12-09 ENCOUNTER — Other Ambulatory Visit: Payer: Self-pay | Admitting: Geriatric Medicine

## 2012-12-09 DIAGNOSIS — Z66 Do not resuscitate: Secondary | ICD-10-CM

## 2012-12-09 DIAGNOSIS — G5600 Carpal tunnel syndrome, unspecified upper limb: Secondary | ICD-10-CM | POA: Insufficient documentation

## 2012-12-09 DIAGNOSIS — H25019 Cortical age-related cataract, unspecified eye: Secondary | ICD-10-CM | POA: Insufficient documentation

## 2012-12-09 NOTE — Assessment & Plan Note (Signed)
Blood pressure remains well controlled on current medications, recent lab satisfactory. Continue medications, monitor lab at intervals.

## 2012-12-09 NOTE — Assessment & Plan Note (Signed)
No change in discomfort patient's wrists and thumbs. She's not wearing the braces very often. Discussed and demonstrated some gentle hand and wrist stretches today that may help with decreasing symptoms.

## 2012-12-09 NOTE — Assessment & Plan Note (Signed)
Venous Dopplers and negative for DVT, right foot x-ray negative for acute findings significant for mild osteoarthritis at the base of her toes. Patient continues to have mild pain in his foot at times. Specific interventions at this time.

## 2012-12-09 NOTE — Assessment & Plan Note (Signed)
No change in symptoms, patient continues interventions prescribed by Dr.MacDiarmid

## 2012-12-09 NOTE — Assessment & Plan Note (Signed)
Reflux symptoms remain well controlled on b.i.d. dosing of Nexium. Dietary staff at [facility] have been helping her with her food choices to expand her diet repertoire as well as help her to avoid foods that exacerbate symptoms

## 2012-12-09 NOTE — Assessment & Plan Note (Signed)
Most recent evaluation by Dr. Edilia Bo in February 2014. ABI on the right 0.73 left 0.58, he notes these are stable readings. No change in therapy; patient continues on Plavix, is encouraged to walk daily. Patient will be evaluated yearly by Dr. Edilia Bo

## 2012-12-09 NOTE — Assessment & Plan Note (Addendum)
Status post right cataract extraction/IOL implant July 2014. Vision much improved. Left eye surgery to be scheduled.

## 2012-12-09 NOTE — Assessment & Plan Note (Signed)
Patient complains of abnormal sensations extending from her back of her ankle up posteriorly to the midcalf. Continues to have bilateral foot numbness. Patient continues to ambulate with a walker daily though she is uncomfortable.  Discussed and demonstrated gentle ankle and foot stretches that may help with reducing symptoms.

## 2012-12-12 ENCOUNTER — Encounter: Payer: Self-pay | Admitting: Internal Medicine

## 2012-12-12 DIAGNOSIS — L659 Nonscarring hair loss, unspecified: Secondary | ICD-10-CM | POA: Insufficient documentation

## 2012-12-12 DIAGNOSIS — N952 Postmenopausal atrophic vaginitis: Secondary | ICD-10-CM | POA: Insufficient documentation

## 2012-12-12 NOTE — Patient Instructions (Signed)
Continue current medications. 

## 2012-12-12 NOTE — Progress Notes (Signed)
Date: 12/12/2012  MRN:  [id number] Name:  Tara Walters Sex:  female Age:  79 y.o. DOB:01/06/1922   [facility] #:   [phone]                    Facility/Room; [facility] Level Of Care: Assisted living, room 634 Provider: Murray Hodgkins, M.D.  Emergency Contacts: Contact Information   Name Relation Home Work Mobile   Reed,April Daughter [phone]  [phone]   Reed,Tilden Relative [phone]  [phone]   Daisy Lazar Relative [phone]  [phone]      Code Status: DO NOT RESUSCITATE  Allergies:No Known Allergies     HPI: 79 year old female resident at [facility] in the assisted living area, and room 634, was seen 08/30/12 for complete evaluation of her medical problems.  The patient's complaints today are:  Loss of hair  Swelling in the right foot  Pain in the right foot at the heel  Vaginal itching  She has a known neuropathy of the feet. Patient had ultrasound of her legs 08/05/2012 which was normal. An x-ray of the right ankle and right foot 08/05/2012 which showed arthritis, but no fractures or other acute changes.   Past Medical History  Diagnosis Date  . Personality change due to conditions classified elsewhere   . Carpal tunnel syndrome   . Hypertension   . Peripheral vascular disease   . Carotid artery occlusion   . Unspecified pruritic disorder   . Anxiety   . Abnormality of gait   . Pain in joint, ankle and foot 08/05/2012  . Chronic airway obstruction, not elsewhere classified 08/18/2011  . Open wound of lip, without mention of complication 08/18/2011  . Alopecia, unspecified 07/14/2011  . Sebaceous cyst 07/14/2011  . Allergic rhinitis, cause unspecified 03/06/2011  . Rash and other nonspecific skin eruption 03/06/2011  . Edema 12/16/2010  . Shortness of breath 12/16/2010  . Herpes zoster without mention of complication 10/17/2010  . Conjunctival xerosis 06/24/2010  . Disturbance of salivary secretion 06/24/2010  . Stricture or atresia of vagina  06/24/2010  . Irritable bowel syndrome 03/11/2010  . Abdominal pain, right upper quadrant 02/18/2010  . Nonspecific abnormal finding in stool contents 12/07/2009  . Pain in joint, lower leg 05/02/2009  . Lumbago 05/02/2009  . Urinary tract infection, site not specified 04/12/2009  . Hyposmolality and/or hyponatremia 11/14/2008  . Dizziness and giddiness 11/14/2008  . Internal hemorrhoids without mention of complication 08/28/2008  . Personal history of fall 05/22/2008  . Syncope and collapse 04/26/2008  . Disturbance of salivary secretion 04/10/2008  . Unspecified hereditary and idiopathic peripheral neuropathy 02/14/2008  . Atherosclerosis of native arteries of the extremities with intermittent claudication 11/25/2007  . Cramp of limb 09/01/2007  . Disturbance of skin sensation 09/01/2007  . Cervicalgia 08/16/2007  . Vitamin D deficiency 02/01/2007  . Abdominal aneurysm without mention of rupture 12/28/2006  . Other abnormal blood chemistry 12/28/2006  . Loss of weight 12/16/2006  . Insomnia, unspecified 08/31/2006  . Unspecified constipation 05/20/2006  . Encounter for long-term (current) use of other medications 05/20/2006  . Unspecified urinary incontinence 01/05/2006  . Urinary frequency 01/05/2006  . Unspecified hypothyroidism 09/22/2005  . Reflux esophagitis 09/08/2005  . Cataract cortical, senile 12/09/2012    Past Surgical History  Procedure Laterality Date  . Carotid endarterectomy  2009  . Dilation and curettage of uterus  1973  . Colonoscopy  2005    mild diverticulosis  . Cataract extraction w/ intraocular lens implant Right 11/2012  Procedures: 2005 Colonoscopy: ok per pt.; mild diverticulosis. 10-15-2006 Carotid Doppler- LICA 60-79% stenosis. RICA 40-59% stenosis 11-02-2006 MRI lumbar spine-Bulging annulus fibrosus at L4-L5 mild encroachment on inferior aspect. 04/2007 - Carotid doppler: no change 10/2006 - Carotid doppler: LICA >80% stenosis.  11/23/07  - ABI: left 1.3, right .60 - EKG:  NSR 70 BPM 05/31/09 Arterial doppler Rt 67, left 63 05/31/2009 Chest x-ray COPD 06/12/2009 Mammogram negative 02/20/2010 US Abdomen negative 02/28/2010 Mobile X-ray Abdomen no bowel obstruction or ileus 12/27/2010 Cervical spine slight positional curve or spasm with cervical thoracic spine convex to right. Diffuse osteopenia without compression fracture. Slight degenerative disc disease C4-5 and lesser C5-6 with slight multilevel facet and uncinate degenerative joint disease. No acute findings.  12/27/2010 Left Shoulder negative 08/05/2012 ultrasound of the legs: Normal 08/05/2012 x-ray of the right ankle and right foot: Arthritis   Consultants: ORTHOPEDIC- Dr. Beverely Low Urology  MacDiarmid Neurology Dr. Edilia Bo Psychotherapy Dr. Karl Ito OphthEulah Pont Vascular Surg: Dr. Ashley Royalty    Current Outpatient Prescriptions  Medication Sig Dispense Refill  . acetaminophen (TYLENOL) 325 MG tablet Take 325 mg by mouth 2 (two) times daily. Take 2 tablets twice daily for pain relief       . alum & mag hydroxide-simeth (MYLANTA) 200-200-20 MG/5ML suspension Take 5 mLs by mouth at bedtime. Give 150 mL qhs       . AMLODIPINE BESYLATE PO Take 5 mg by mouth daily. Take one daily to treat HTN       . Arnica 20 % TINC Apply topically as needed.      . Artificial Tear (GENTEAL) GEL Apply to eye 2 (two) times daily. Apply twice daily to both eyes to help dry eyes       . benzocaine (HURRICAINE) 20 % GEL Use as directed in the mouth or throat. Apply lower lip daily as needed      . clopidogrel (PLAVIX) 75 MG tablet Take 75 mg by mouth daily. Take one daily to reduce stroke risk       . esomeprazole (NEXIUM) 40 MG capsule Take 40 mg by mouth 2 (two) times daily. One twice daily to reduce stomach acid       . eszopiclone (LUNESTA) 2 MG TABS Take 1 tablet (2 mg total) by mouth at bedtime. Take immediately before bedtime      . loratadine (CLARITIN) 10 MG tablet Take 10 mg by mouth daily.      . meclizine  (ANTIVERT) 25 MG tablet Take 25 mg by mouth every 6 (six) hours as needed. Take one tablet every 6 hours prn for nausea and dizziness       . MYRBETRIQ 50 MG TB24 Take 2 tablets by mouth daily. Take 2 +100mg  daily      . senna (SENOKOT) 8.6 MG tablet Take 1 tablet by mouth daily. 2 tablet q hs      . Vitamins-Lipotropics (B-100 PO) Take 1 tablet by mouth daily.           Immunization History  Administered Date(s) Administered  . Influenza-Generic 11/25/2010     Diet: Regular  History  Substance Use Topics  . Smoking status: Former Smoker -- 1.00 packs/day for 50 years    Types: Cigarettes    Quit date: 05/27/1983  . Smokeless tobacco: Not on file  . Alcohol Use: No     Comment: Formerly a minimal user    Family History  Problem Relation Age of Onset  . Heart attack Mother   .  Cancer Father     Leukemia    REVIEW OF SYSTEMS  DATA OBTAINED: from patient, medical record GENERAL:  No fevers, fatigue, change in appetite or weight SKIN: No itch, rash or open wounds. Thinning hair complaints date back to 2013. EYES: No eye pain, dryness or itching  No change in vision EARS: No earache, tinnitus. Mild loss of hearing bilaterally NOSE: No congestion, drainage or bleeding MOUTH/THROAT: No mouth or tooth pain  No sore throat No difficulty chewing or swallowing. Complaints of a sore feeling in the left lower lip laterally. RESPIRATORY: No cough, wheezing, SOB CARDIAC: No chest pain, palpitations  2+ bipedal edema. Varicosities present bilaterally in the legs. CHEST/BREASTS: No discomfort, discharge or lumps in breasts GI: No abdominal pain  No N/V/D. Chronic intermittent constipation.  No heartburn or reflux  GU: His urinary dribbling. Complaints of increasing frequency. No change in urine volume or character No nocturia or change in stream . There is a slight vaginal itch which is episodic.  MUSCULOSKELETAL: Continues with pain in her right foot. Also complains of discomfort in the  left leg. She has balance difficulties. There is a mild generalized arthritis. NEUROLOGIC: No dizziness, fainting, headache, imbalance. There is a peripheral neuropathy which I believe is the source of much of the pain in her feet. She has numbness and a diminished sensation in her feet. She walks wobbly and unsteady. No change in mental status.  PSYCHIATRIC: No feelings of depression. She has moderate anxiety. She has previously seen a psychologist in the years past. Sleeps well.  No behavior issue.    Vital signs: BP 130/70  Pulse 60  Temp(Src) 97.9 F (36.6 C)  Resp 14  Ht 5' 4.5" (1.638 m)  Wt 150 lb (68.04 kg)  BMI 25.36 kg/m2  SpO2 94%  PHYSICAL EXAM GENERAL APPEARANCE: No acute distress, appropriately groomed, normal body habitus. Alert, pleasant, conversant. SKIN: No diaphoresis rash, unusual lesions, wounds HEAD: Normocephalic, atraumatic EYES: Conjunctiva/lids clear. Pupils round, reactive. EOMs intact.  EARS: External exam WNL, TM WNL. Hearing grossly normal. Wax was left external auditory canal. Removed this visit. NOSE: No deformity or discharge. MOUTH/THROAT: Lips w/o lesions. No visual problems at the left lower lip laterally appreciated where she had indicated there was some discomfort. Oral mucosa, tongue moist, w/o lesion. Oropharynx w/o redness or lesions. Upper and lower dentures. NECK: Supple, full ROM. No thyroid tenderness, enlargement or nodule LYMPHATICS: No head, neck or supraclavicular adenopathy RESPIRATORY: Breathing is even, unlabored. Lung sounds are clear and full.  CARDIOVASCULAR: Heart RRR. No murmur or extra heart sounds  ARTERIAL: Decreased pulses in femoral arteries, popliteal arteries, and absent posterior tibial and dorsalis pedis pulses bilaterally.   VENOUS: No varicosities. No venous stasis skin changes. Moderate varicosities bilaterally.Marland Kitchen  EDEMA: 2+ bipedal edema. No ascites. GASTROINTESTINAL: Abdomen is soft, non-tender, not distended w/  normal bowel sounds. No hepatic or splenic enlargement. No mass, ventral or inguinal hernia.  RECTAL: No anal fissure, skin tag or external hemorrhoid. Sphincter tone WNL. Stool is brown, heme negative. GENITOURINARY: Bladder non tender, not distended.  FEMALE: No urethral discharge.  Vaginal atrophy, with vulvar atrophy but no other vulva lesion, vaginal bleeding, or mass. No cystocele, no rectocele MUSCULOSKELETAL: Moves all extremities with full ROM, strength and tone. Back is without kyphosis, scoliosis or spinal process tenderness. Gait is steady NEUROLOGIC: Oriented to time, place, person. Cranial nerves 2-12 grossly intact, speech clear. Mild tremor noted at the chin.  Fine, mild tremor noted in the extended  hands. Patella, brachial DTR 2+. PSYCHIATRIC: Mood and affect appropriate to situation   Screening Score  MMS    PHQ2    PHQ9     Fall Risk    BIMS    Lab reports 12/05/08: TSH 2.21 B12 580  03/20/11 WBC 3.7, Hgb 12.1, Hct 37.0, Plt 204 Glu 80, BUN 22, Cr. .68, Na 137, K+ 4.3. Protein/LFTsWNL TSH 2.82  07/15/2011 Vitamin B12 474, Folate 14.0 03/03/2012 CMP: Sodium 137, Potassium 4.5, glucose 67, BUN 21, Creatinine 0.72   CBC Wbc 3.5, Rbc 4.36, Hgb 12.2, Hct 35.9 TSH 2.659 04/08/2012 Urine negative  Urine culture no growth  Vitamin B -12  420 08/19/2012 CBC: Hemoglobin 11.6, RDW 17.2  CMP: Normal  TSH 2.744 Annual summary: Hospitalizations: None the last year Infection History:  none of significance Functional assessment: Needs help remembering medications. Unstable gait. Independent in feeding, use of toilet. Needs assistance with bathing and walking.  Areas of potential improvement: none likely  Rehabilitation Potential: not pertinent  Prognosis for survival: fair   Plan: PVD (peripheral vascular disease) Most likely a contributor to her pains in her feet.  HTN (hypertension) Controlled  Carotid stenosis, unspecified laterality Previous carotid  endarterectomy. Asymptomatic.  Neuropathy, peripheral Significant contributor to discomfort in feet  Anxiety Controlled  Gait disorder Unchanged. Spends most time in the wheelchair and going places. Able to stand and move about her room satisfactorily.  Urinary incontinence Chronic. Unchanged. No change in orders.  Hypothyroidism Controlled on supplements  GERD (gastroesophageal reflux disease)  Vaginal atrophy Contributor to sensations of itching in the vaginal area.  Pain in joint, ankle and foot, left Multifactorial contributions to this symptom; arthritis, peripheral vascular disease, peripheral neuropathy.  Hair thinning Chronic complaint related to aging. No change in orders.  Edema Most likely related to varicose veins and venous insufficiency  Despite her complaints of the multitude of problems listed in the past medical history, patient seems to be relatively stable at this time. They have left her medications without change.

## 2012-12-14 DIAGNOSIS — S93419A Sprain of calcaneofibular ligament of unspecified ankle, initial encounter: Secondary | ICD-10-CM | POA: Diagnosis not present

## 2012-12-14 DIAGNOSIS — L608 Other nail disorders: Secondary | ICD-10-CM | POA: Diagnosis not present

## 2012-12-22 ENCOUNTER — Encounter: Payer: Self-pay | Admitting: Geriatric Medicine

## 2012-12-22 ENCOUNTER — Other Ambulatory Visit: Payer: Self-pay | Admitting: Internal Medicine

## 2012-12-22 ENCOUNTER — Non-Acute Institutional Stay: Payer: Medicare Other | Admitting: Geriatric Medicine

## 2012-12-22 VITALS — BP 160/74 | HR 72 | Temp 96.3°F

## 2012-12-22 DIAGNOSIS — M25572 Pain in left ankle and joints of left foot: Secondary | ICD-10-CM

## 2012-12-22 DIAGNOSIS — M25579 Pain in unspecified ankle and joints of unspecified foot: Secondary | ICD-10-CM

## 2012-12-22 NOTE — Assessment & Plan Note (Signed)
Persistent pain patient's left foot and ankle since a fall June 2014. Portable x-rays have been negative for fracture or other acute injury. Patient continues with mild swelling of her foot tenderness the forefoot on the sole of her foot and the lateral ankle. Will arrange for repeat, nonportable x-rays to assess the joint and foot further. If no fracture this may represent some plantar fasciitis and/or ligament injury injury.

## 2012-12-22 NOTE — Progress Notes (Signed)
Patient ID: Tara Walters, female   DOB: 1921/09/19, 79 y.o.   MRN: [id number] [facility] [phone]   Contact Information   Name Relation Home Work Mobile   Reed,April Daughter [phone]  [phone]   Reed,Tilden Relative [phone]  [phone]   Daisy Lazar Relative [phone]  [phone]      Chief Complaint  Patient presents with  . Ankle Pain    left ankle pain, drags right foot, not leaving apt to eat or get hair done - per AL nurse. Patient said she won't leave room because she can't walk.    HPI: This is a 79 y.o. female resident of [facility],  Assisted Living Skilled section evaluated today for evaluation of right foot dragging and left ankle pain. patient has h/o abnormal sensations in the back of her lower legs as well as bilateral foot numbness. Until recently, has not really interfered with her walking. Patient reports she's unable to bear weight on her left foot due to pain. She describes the pain as being in the bottom of her foot as well as along the lateral aspect of her ankle.   No Known Allergies   Medication List       This list is accurate as of: 12/22/12  4:12 PM.  Always use your most recent med list.               acetaminophen 325 MG tablet  Commonly known as:  TYLENOL  Take 325 mg by mouth 2 (two) times daily. Take 2 tablets twice daily for pain relief     AMLODIPINE BESYLATE PO  Take 5 mg by mouth daily. Take one daily to treat HTN     Arnica 20 % Tinc  Apply topically as needed.     B-100 PO  Take 1 tablet by mouth daily.     benzocaine 20 % Gel  Commonly known as:  HURRICAINE  Use as directed in the mouth or throat. Apply lower lip daily as needed     clopidogrel 75 MG tablet  Commonly known as:  PLAVIX  Take 75 mg by mouth daily. Take one daily to reduce stroke risk     esomeprazole 40 MG capsule  Commonly known as:  NEXIUM  Take 40 mg by mouth 2 (two) times daily. One twice  daily to reduce stomach acid     eszopiclone 2 MG Tabs  Commonly known as:  LUNESTA  Take 1 tablet (2 mg total) by mouth at bedtime. Take immediately before bedtime     GENTEAL Gel  Apply to eye 2 (two) times daily. Apply twice daily to both eyes to help dry eyes     loratadine 10 MG tablet  Commonly known as:  CLARITIN  Take 10 mg by mouth daily.     meclizine 25 MG tablet  Commonly known as:  ANTIVERT  Take 25 mg by mouth every 6 (six) hours as needed. Take one tablet every 6 hours prn for nausea and dizziness     MYLANTA 200-200-20 MG/5ML suspension  Generic drug:  alum & mag hydroxide-simeth  Take 5 mLs by mouth at bedtime. Give 150 mL qhs     MYRBETRIQ 50 MG Tb24  Generic drug:  mirabegron ER  Take 2 tablets by mouth daily. Take 2 +100mg  daily     senna 8.6 MG tablet  Commonly known as:  SENOKOT  Take 1 tablet by mouth daily. 2 tablet q hs  DATA REVIEWED   Radiologic Exams: [facility] mobile x-ray   08/05/2012 right foot x-ray: Mild hallux valgus deformity of foot. Mild osteoarthritis at the base of the toes. No acute fracture or other injury. No evidence for osteomyelitis   11/29/2012 X-ray of left ankle mild diffuse osteopenia. No acute fracture, malalignment or lytic destructive lesion. Mild soft tissue swelling seen diffusely. No evidence for osteomyelitis  Left foot x-ray, 3 views minimal hallux valgus deformity. Mild osteoarthritis of the distal toes. No subluxation, dislocation or lytic destructive lesion. Old healed deformity at the distal metaphysis of the fifth metatarsal, first distal phalanx, third distal phalanx. No evidence of osteomyelitis   Cardiovascular Exams:  06/2012 Carotid Doppler / ABI exam results reviewed  08/05/2012 right lower extremity venous Doppler exam: No evidence of DVT   Laboratory Studies: [facility] lab, external  07/15/2011 Vitamin B12 474, Folate 14.0  03/03/2012 CMP: Sodium 137, Potassium 4.5, glucose 67, BUN 21, Creatinine 0.72    CBC Wbc 3.5, Rbc 4.36, Hgb 12.2, Hct 35.9   TSH 2.659  04/08/2012 Urine negative   Urine culture no growth   Vitamin B -12 420  08/19/2012: WBC 4.9, hemoglobin 11.6, hematocrit 34.7, platelets 220   Glucose 79, BUN 23, creatinine 0.84, sodium 138, potassium 4.4. Protein/LFTs WNL   TSH 2.74   Review of Systems  DATA OBTAINED: from patient,  medical record GENERAL: Feels well   No fevers, fatigue, change in appetite or weight MUSCULOSKELETAL: No joint pain, swelling or stiffness  right foot pain No back pain  No muscle ache, pain, weakness  Gait is unsteady  No recent falls.  NEUROLOGIC: No dizziness, fainting, headache. Chronic bipedal numbness  No change in mental status.  PSYCHIATRIC: No feelings of anxiety, depression Sleeps well.     Physical Exam Filed Vitals:   12/22/12 1605  BP: 160/74  Pulse: 72  Temp: 96.3 F (35.7 C)  TempSrc: Oral   There is no weight on file to calculate BMI.  GENERAL APPEARANCE: No acute distress, appropriately groomed, normal body habitus. Alert, pleasant, conversant. MUSCULOSKELETAL: Moves all extremities with full ROM, movement is slow and somewhat stiff, normal strength and tone. Back is without kyphosis, scoliosis or spinal process tenderness. Gait is not assessed today.  Right foot is swollen at 4 foot, tender across anterior forefoot, very tender plantar aspect and at head of heel. Tenderness to lateral malleolus as well NEUROLOGIC: Oriented to time, place, person. Speech clear, no tremor.  PSYCHIATRIC: Mood and affect appropriate to situation  ASSESSMENT/PLAN  Pain in joint, ankle and foot Persistent pain patient's left foot and ankle since a fall June 2014. Portable x-rays have been negative for fracture or other acute injury. Patient continues with mild swelling of her foot tenderness the forefoot on the sole of her foot and the lateral ankle. Will arrange for repeat, nonportable x-rays to assess the joint and foot further. If no fracture  this may represent some plantar fasciitis and/or ligament injury injury.    Follow up: 3 months Dr.Green, annual update.  Jeilani Grupe T.Jahshua Bonito, NP-C 12/22/2012

## 2012-12-23 ENCOUNTER — Ambulatory Visit
Admission: RE | Admit: 2012-12-23 | Discharge: 2012-12-23 | Disposition: A | Discharge status: Home / Self Care | Payer: Medicare Other | Source: Ambulatory Visit | Attending: Internal Medicine | Admitting: Internal Medicine

## 2012-12-23 ENCOUNTER — Other Ambulatory Visit: Payer: Self-pay | Admitting: Internal Medicine

## 2012-12-23 DIAGNOSIS — M25572 Pain in left ankle and joints of left foot: Secondary | ICD-10-CM

## 2012-12-23 DIAGNOSIS — S92009A Unspecified fracture of unspecified calcaneus, initial encounter for closed fracture: Secondary | ICD-10-CM | POA: Diagnosis not present

## 2012-12-24 DIAGNOSIS — S92009A Unspecified fracture of unspecified calcaneus, initial encounter for closed fracture: Secondary | ICD-10-CM | POA: Diagnosis not present

## 2012-12-27 ENCOUNTER — Encounter: Payer: Self-pay | Admitting: Geriatric Medicine

## 2012-12-27 ENCOUNTER — Non-Acute Institutional Stay (SKILLED_NURSING_FACILITY): Payer: Medicare Other | Admitting: Geriatric Medicine

## 2012-12-27 DIAGNOSIS — S92009A Unspecified fracture of unspecified calcaneus, initial encounter for closed fracture: Secondary | ICD-10-CM | POA: Diagnosis not present

## 2012-12-27 DIAGNOSIS — S92002A Unspecified fracture of left calcaneus, initial encounter for closed fracture: Secondary | ICD-10-CM

## 2012-12-27 NOTE — Assessment & Plan Note (Signed)
Left calcaneal fracture and left fifth metatarsal fracture noted on repeat x-rays at 12/23/2012. Foot is immobilized in a CAM boot, awaiting scheduling of CT scan and followup with Dr. Victorino Dike. Patient will stay rehabilitation section for assistance with ambulation and ADLs.

## 2012-12-27 NOTE — Progress Notes (Signed)
Patient ID: Tara Walters, female   DOB: 08-24-1921, 79 y.o.   MRN: [id number] [facility] [phone])      Contact Information   Name Relation Home Work Mobile   Reed,April Daughter [phone]  [phone]   Reed,Tilden Relative [phone]  [phone]   Daisy Lazar Relative [phone]  [phone]      Chief Complaint  Patient presents with  . Foot Injury    HPI: This is a 79 y.o. female resident of [facility],  Assisted Living Skilled section evaluated today in follow up of left heel fracture. Repeat left foot [facility] Child X-ray 12/23/12 revealed calcaneal fracture. She was seen in consultation by Dr. Shon Baton who placed pt. In a CAM boot. He has also ordered a CT scan of the foot, and recommended f/u with Dr. Victorino Dike. Patient was having increasing difficulty managing ambulation by herself wearing the CAM boot. She was transferred to the rehabilitation section on 8/2/1 4 for assistance with ambulation and general ADLs.    No Known Allergies   Medication List       This list is accurate as of: 12/27/12 10:39 AM.  Always use your most recent med list.               acetaminophen 325 MG tablet  Commonly known as:  TYLENOL  Take 325 mg by mouth 2 (two) times daily. Take 2 tablets twice daily for pain relief     AMLODIPINE BESYLATE PO  Take 5 mg by mouth daily. Take one daily to treat HTN     Arnica 20 % Tinc  Apply topically as needed.     B-100 PO  Take 1 tablet by mouth daily.     benzocaine 20 % Gel  Commonly known as:  HURRICAINE  Use as directed in the mouth or throat. Apply lower lip daily as needed     clopidogrel 75 MG tablet  Commonly known as:  PLAVIX  Take 75 mg by mouth daily. Take one daily to reduce stroke risk     esomeprazole 40 MG capsule  Commonly known as:  NEXIUM  Take 40 mg by mouth 2 (two) times daily. One twice daily to reduce stomach acid     eszopiclone 2 MG Tabs tablet  Commonly known as:  LUNESTA   Take 1 tablet (2 mg total) by mouth at bedtime. Take immediately before bedtime     fluocinonide 0.05 % external solution  Commonly known as:  LIDEX  Apply topically. Apply to scalp as needed     GENTEAL Gel  Apply to eye 2 (two) times daily. Apply twice daily to both eyes to help dry eyes     hydrocortisone cream 1 %  Apply topically. Apply to affected areas twice daily as needed for itch     loratadine 10 MG tablet  Commonly known as:  CLARITIN  Take 10 mg by mouth daily.     meclizine 25 MG tablet  Commonly known as:  ANTIVERT  Take 25 mg by mouth every 6 (six) hours as needed. Take one tablet every 6 hours prn for nausea and dizziness     moxifloxacin 0.5 % ophthalmic solution  Commonly known as:  VIGAMOX  1 drop. One drop in left eye 2 days prior to surgery four times a day     MYLANTA 200-200-20 MG/5ML suspension  Generic drug:  alum & mag hydroxide-simeth  Take 5 mLs by mouth at bedtime. Give 150 mL qhs  MYRBETRIQ 50 MG Tb24 tablet  Generic drug:  mirabegron ER  Take 2 tablets by mouth daily. Take 2 +100mg  daily     PROLENSA 0.07 % Soln  Generic drug:  Bromfenac Sodium  Apply to eye. Use one drop in left eye daily prior to surgery     senna 8.6 MG tablet  Commonly known as:  SENOKOT  Take 1 tablet by mouth daily. 2 tablet q hs       DATA REVIEWED   Radiologic Exams: [facility] mobile x-ray   08/05/2012 right foot x-ray: Mild hallux valgus deformity of foot. Mild osteoarthritis at the base of the toes. No acute fracture or other injury. No evidence for osteomyelitis   11/29/2012 X-ray of left ankle mild diffuse osteopenia. No acute fracture, malalignment or lytic destructive lesion. Mild soft tissue swelling seen diffusely. No evidence for osteomyelitis  Left foot x-ray, 3 views minimal hallux valgus deformity. Mild osteoarthritis of the distal toes. No subluxation, dislocation or lytic destructive lesion. Old healed deformity at the distal metaphysis of the  fifth metatarsal, first distal phalanx, third distal phalanx. No evidence of osteomyelitis   [place] imaging 12/23/2012 left ankle complete 3+ view: Impression. Nondisplaced fracture of the posterior aspect of the calcaneus. Consider CT to assess further. Osteopenia  Left foot complete 3+ view: Impression fracture of the posterior calcaneus without significant displacement. Nondisplaced fracture of the left fifth metatarsal neck   Cardiovascular Exams:  06/2012 Carotid Doppler / ABI exam results reviewed  08/05/2012 right lower extremity venous Doppler exam: No evidence of DVT   Laboratory Studies: [facility] lab, external  07/15/2011 Vitamin B12 474, Folate 14.0  03/03/2012 CMP: Sodium 137, Potassium 4.5, glucose 67, BUN 21, Creatinine 0.72   CBC Wbc 3.5, Rbc 4.36, Hgb 12.2, Hct 35.9   TSH 2.659  04/08/2012 Urine negative   Urine culture no growth   Vitamin B -12 420  08/19/2012: WBC 4.9, hemoglobin 11.6, hematocrit 34.7, platelets 220   Glucose 79, BUN 23, creatinine 0.84, sodium 138, potassium 4.4. Protein/LFTs WNL   TSH 2.74   Review of Systems  DATA OBTAINED: from patient,  medical record GENERAL: Feels well   No fevers, fatigue, change in appetite or weight SKIN: No itch, rash or open wounds EYES: No eye pain, dryness or itching  No change in vision EARS: No earache, tinnitus, change in hearing NOSE: No congestion, drainage or bleeding MOUTH/THROAT: No mouth or tooth pain  No sore throat No difficulty chewing or swallowing RESPIRATORY: No cough, wheezing, SOB CARDIAC: No chest pain, palpitations  No edema. GI: No abdominal pain  No N/V/D or constipation  No heartburn or reflux  GU: No dysuria, frequency or urgency  No change in urine volume or character No nocturia or change in stream  MUSCULOSKELETAL:  Knees are uncomfortable today, increased left foot pain since application of the CAM boot , right foot discomfort is unchanged  Gait is difficult with CAM boot  No recent falls.   NEUROLOGIC: No dizziness, fainting, headache. Chronic bipedal numbness  No change in mental status.  PSYCHIATRIC: No feelings of anxiety, depression Sleeps well.    Physical Exam Filed Vitals:   12/27/12 1037  BP: 149/75  Pulse: 70  Temp: 97.2 F (36.2 C)  Resp: 18  Weight: 149 lb 9.6 oz (67.858 kg)  SpO2: 97%   Body mass index is 24.89 kg/(m^2).  GENERAL APPEARANCE: No acute distress, appropriately groomed, normal body habitus. Alert, pleasant, conversant. SKIN: No diaphoresis, rash, unusual lesions, wounds HEAD:  Normocephalic, atraumatic EYES: Conjunctiva/lids clear. Pupils round, reactive.  EARS: Decreased Hearing  NOSE: No deformity or discharge. MOUTH/THROAT: Lips w/o lesions. Oral mucosa, tongue moist, w/o lesion. Oropharynx w/o redness or lesions.  NECK: Supple, full ROM. No thyroid tenderness, enlargement or nodule LYMPHATICS: No head, neck or supraclavicular adenopathy RESPIRATORY: Breathing is even, unlabored. Lung sounds are clear and full.  CARDIOVASCULAR: Heart RRR. No murmur or extra heart sounds MUSCULOSKELETAL: Moves all extremities with full ROM, movement is slow and somewhat stiff, normal strength and tone. Back is without kyphosis, scoliosis or spinal process tenderness. Gait not assessed today.  Left foot/ankle in CAM boot, toes pink/warm, move easily.  NEUROLOGIC: Oriented to time, place, person. Speech clear, no tremor.  PSYCHIATRIC: Mood and affect appropriate to situation   ASSESSMENT/PLAN   Fracture, calcaneus closed Left calcaneal fracture and left fifth metatarsal fracture noted on repeat x-rays at 12/23/2012. Foot is immobilized in a CAM boot, awaiting scheduling of CT scan and followup with Dr. Victorino Dike. Patient will stay rehabilitation section for assistance with ambulation and ADLs.    Follow up: As needed Jaidin Richison T.Ceceilia Cephus, NP-C 12/27/2012

## 2012-12-28 ENCOUNTER — Other Ambulatory Visit: Payer: Self-pay | Admitting: Orthopedic Surgery

## 2012-12-28 ENCOUNTER — Ambulatory Visit
Admission: RE | Admit: 2012-12-28 | Discharge: 2012-12-28 | Disposition: A | Discharge status: Home / Self Care | Payer: Medicare Other | Source: Ambulatory Visit | Attending: Orthopedic Surgery | Admitting: Orthopedic Surgery

## 2012-12-28 DIAGNOSIS — R279 Unspecified lack of coordination: Secondary | ICD-10-CM | POA: Diagnosis not present

## 2012-12-28 DIAGNOSIS — S92002A Unspecified fracture of left calcaneus, initial encounter for closed fracture: Secondary | ICD-10-CM

## 2012-12-28 DIAGNOSIS — R269 Unspecified abnormalities of gait and mobility: Secondary | ICD-10-CM | POA: Diagnosis not present

## 2012-12-28 DIAGNOSIS — M6281 Muscle weakness (generalized): Secondary | ICD-10-CM | POA: Diagnosis not present

## 2012-12-28 DIAGNOSIS — S92009A Unspecified fracture of unspecified calcaneus, initial encounter for closed fracture: Secondary | ICD-10-CM | POA: Diagnosis not present

## 2012-12-28 DIAGNOSIS — Z9181 History of falling: Secondary | ICD-10-CM | POA: Diagnosis not present

## 2012-12-29 DIAGNOSIS — R279 Unspecified lack of coordination: Secondary | ICD-10-CM | POA: Diagnosis not present

## 2012-12-29 DIAGNOSIS — S92309A Fracture of unspecified metatarsal bone(s), unspecified foot, initial encounter for closed fracture: Secondary | ICD-10-CM | POA: Diagnosis not present

## 2012-12-29 DIAGNOSIS — M6281 Muscle weakness (generalized): Secondary | ICD-10-CM | POA: Diagnosis not present

## 2012-12-29 DIAGNOSIS — R269 Unspecified abnormalities of gait and mobility: Secondary | ICD-10-CM | POA: Diagnosis not present

## 2012-12-29 DIAGNOSIS — S92009A Unspecified fracture of unspecified calcaneus, initial encounter for closed fracture: Secondary | ICD-10-CM | POA: Diagnosis not present

## 2012-12-29 DIAGNOSIS — Z9181 History of falling: Secondary | ICD-10-CM | POA: Diagnosis not present

## 2012-12-30 DIAGNOSIS — R279 Unspecified lack of coordination: Secondary | ICD-10-CM | POA: Diagnosis not present

## 2012-12-30 DIAGNOSIS — S92009A Unspecified fracture of unspecified calcaneus, initial encounter for closed fracture: Secondary | ICD-10-CM | POA: Diagnosis not present

## 2012-12-30 DIAGNOSIS — M6281 Muscle weakness (generalized): Secondary | ICD-10-CM | POA: Diagnosis not present

## 2012-12-30 DIAGNOSIS — R269 Unspecified abnormalities of gait and mobility: Secondary | ICD-10-CM | POA: Diagnosis not present

## 2012-12-30 DIAGNOSIS — Z9181 History of falling: Secondary | ICD-10-CM | POA: Diagnosis not present

## 2012-12-31 DIAGNOSIS — M6281 Muscle weakness (generalized): Secondary | ICD-10-CM | POA: Diagnosis not present

## 2012-12-31 DIAGNOSIS — S92009A Unspecified fracture of unspecified calcaneus, initial encounter for closed fracture: Secondary | ICD-10-CM | POA: Diagnosis not present

## 2012-12-31 DIAGNOSIS — R279 Unspecified lack of coordination: Secondary | ICD-10-CM | POA: Diagnosis not present

## 2012-12-31 DIAGNOSIS — Z9181 History of falling: Secondary | ICD-10-CM | POA: Diagnosis not present

## 2012-12-31 DIAGNOSIS — R269 Unspecified abnormalities of gait and mobility: Secondary | ICD-10-CM | POA: Diagnosis not present

## 2013-01-03 DIAGNOSIS — R279 Unspecified lack of coordination: Secondary | ICD-10-CM | POA: Diagnosis not present

## 2013-01-03 DIAGNOSIS — S92009A Unspecified fracture of unspecified calcaneus, initial encounter for closed fracture: Secondary | ICD-10-CM | POA: Diagnosis not present

## 2013-01-03 DIAGNOSIS — Z9181 History of falling: Secondary | ICD-10-CM | POA: Diagnosis not present

## 2013-01-03 DIAGNOSIS — R269 Unspecified abnormalities of gait and mobility: Secondary | ICD-10-CM | POA: Diagnosis not present

## 2013-01-03 DIAGNOSIS — M6281 Muscle weakness (generalized): Secondary | ICD-10-CM | POA: Diagnosis not present

## 2013-01-04 DIAGNOSIS — S92009A Unspecified fracture of unspecified calcaneus, initial encounter for closed fracture: Secondary | ICD-10-CM | POA: Diagnosis not present

## 2013-01-04 DIAGNOSIS — M6281 Muscle weakness (generalized): Secondary | ICD-10-CM | POA: Diagnosis not present

## 2013-01-04 DIAGNOSIS — R269 Unspecified abnormalities of gait and mobility: Secondary | ICD-10-CM | POA: Diagnosis not present

## 2013-01-04 DIAGNOSIS — Z9181 History of falling: Secondary | ICD-10-CM | POA: Diagnosis not present

## 2013-01-04 DIAGNOSIS — R279 Unspecified lack of coordination: Secondary | ICD-10-CM | POA: Diagnosis not present

## 2013-01-05 DIAGNOSIS — R269 Unspecified abnormalities of gait and mobility: Secondary | ICD-10-CM | POA: Diagnosis not present

## 2013-01-05 DIAGNOSIS — Z9181 History of falling: Secondary | ICD-10-CM | POA: Diagnosis not present

## 2013-01-05 DIAGNOSIS — S92009A Unspecified fracture of unspecified calcaneus, initial encounter for closed fracture: Secondary | ICD-10-CM | POA: Diagnosis not present

## 2013-01-05 DIAGNOSIS — M6281 Muscle weakness (generalized): Secondary | ICD-10-CM | POA: Diagnosis not present

## 2013-01-05 DIAGNOSIS — R279 Unspecified lack of coordination: Secondary | ICD-10-CM | POA: Diagnosis not present

## 2013-01-06 ENCOUNTER — Encounter: Payer: Self-pay | Admitting: Geriatric Medicine

## 2013-01-06 ENCOUNTER — Non-Acute Institutional Stay (SKILLED_NURSING_FACILITY): Payer: Medicare Other | Admitting: Geriatric Medicine

## 2013-01-06 DIAGNOSIS — IMO0001 Reserved for inherently not codable concepts without codable children: Secondary | ICD-10-CM

## 2013-01-06 DIAGNOSIS — R32 Unspecified urinary incontinence: Secondary | ICD-10-CM | POA: Diagnosis not present

## 2013-01-06 DIAGNOSIS — S92002D Unspecified fracture of left calcaneus, subsequent encounter for fracture with routine healing: Secondary | ICD-10-CM

## 2013-01-06 DIAGNOSIS — M6281 Muscle weakness (generalized): Secondary | ICD-10-CM | POA: Diagnosis not present

## 2013-01-06 DIAGNOSIS — Z9181 History of falling: Secondary | ICD-10-CM | POA: Diagnosis not present

## 2013-01-06 DIAGNOSIS — R269 Unspecified abnormalities of gait and mobility: Secondary | ICD-10-CM | POA: Diagnosis not present

## 2013-01-06 DIAGNOSIS — S92009A Unspecified fracture of unspecified calcaneus, initial encounter for closed fracture: Secondary | ICD-10-CM | POA: Diagnosis not present

## 2013-01-06 DIAGNOSIS — R279 Unspecified lack of coordination: Secondary | ICD-10-CM | POA: Diagnosis not present

## 2013-01-06 NOTE — Assessment & Plan Note (Signed)
Symptoms worsened with missed appointment for PTNS. Arrangements have been made for this procedure tomorrow at [facility] urology.

## 2013-01-06 NOTE — Progress Notes (Signed)
Patient ID: Tara Walters, female   DOB: Oct 02, 1921, 79 y.o.   MRN: [id number] [facility] (31)  .Code Status:  Living Will, DNR     Contact Information   Name Relation Home Work Mobile   Reed,April Daughter [phone]  [phone]   Reed,Tilden Relative [phone]  [phone]   Daisy Lazar Relative [phone]  [phone]      Chief Complaint  Patient presents with  . Foot Injury    HPI: This is  79 y.o. female resident of [facility], is currently residing in the rehabilitation section due to a left heel fracture.  Left foot/ankle X-ray 12/23/12 revealed calcaneal fracture. CT scan of the foot and ankle confirmed the fracture.  Patient was placed in a CAM boot on August 1 for immobilization of the fracture. She followed up with Dr. Victorino Dike in his office August 6, he recommended continued nonweightbearing of the left lower extremity for 2 more weeks.  Patient has been working with physical and occupational therapies, is making some progress regarding mobilization with nonweightbearing status on the left leg. She continues to require skilled level of care for assistance with all ADLs.  Patient's overall status has remained stable, her urinary incontinence has worsened as she has missed some appointments with urology for PTNS. An appointment is scheduled for tomorrow.    No Known Allergies Medication list reviewed  DATA REVIEWED   Radiologic Exams: [facility] mobile x-ray   08/05/2012 right foot x-ray: Mild hallux valgus deformity of foot. Mild osteoarthritis at the base of the toes. No acute fracture or other injury. No evidence for osteomyelitis   11/29/2012 X-ray of left ankle mild diffuse osteopenia. No acute fracture, malalignment or lytic destructive lesion. Mild soft tissue swelling seen diffusely. No evidence for osteomyelitis  Left foot x-ray, 3 views minimal hallux valgus deformity. Mild osteoarthritis of the distal toes. No  subluxation, dislocation or lytic destructive lesion. Old healed deformity at the distal metaphysis of the fifth metatarsal, first distal phalanx, third distal phalanx. No evidence of osteomyelitis   [place] imaging 12/23/2012 left ankle complete 3+ view: Impression. Nondisplaced fracture of the posterior aspect of the calcaneus. Consider CT to assess further. Osteopenia  Left foot complete 3+ view: Impression fracture of the posterior calcaneus without significant displacement. Nondisplaced fracture of the left fifth metatarsal neck   12/28/2012 CT OF THE LEFT ANKLE WITHOUT CONTRAST: IMPRESSION:Mildly displaced fracture of the calcaneal tuberosity appears unchanged from recent radiographs.  The appearance suggests a subacute insufficiency fracture.  There is no involvement of the subtalar joint.   Cardiovascular Exams:  06/2012 Carotid Doppler / ABI exam results reviewed  08/05/2012 right lower extremity venous Doppler exam: No evidence of DVT   Laboratory Studies: [facility] lab, external  07/15/2011 Vitamin B12 474, Folate 14.0  03/03/2012 CMP: Sodium 137, Potassium 4.5, glucose 67, BUN 21, Creatinine 0.72   CBC Wbc 3.5, Rbc 4.36, Hgb 12.2, Hct 35.9   TSH 2.659  04/08/2012 Urine negative   Urine culture no growth   Vitamin B -12 420  08/19/2012: WBC 4.9, hemoglobin 11.6, hematocrit 34.7, platelets 220   Glucose 79, BUN 23, creatinine 0.84, sodium 138, potassium 4.4. Protein/LFTs WNL   TSH 2.74   Review of Systems  DATA OBTAINED: from patient, nurse,  medical record GENERAL: Feels well   No fevers, fatigue, change in appetite or weight SKIN: No itch, rash or open wounds EYES: No eye pain, dryness or itching  No change in vision EARS: No earache, tinnitus, change in  hearing NOSE: No congestion, drainage or bleeding MOUTH/THROAT: No mouth or tooth pain  No sore throat No difficulty chewing or swallowing RESPIRATORY: No cough, wheezing, SOB CARDIAC: No chest pain, palpitations  No  edema. GI: No abdominal pain  No N/V/D or constipation  No heartburn or reflux  GU: No dysuria. Increased frequency, especially at night  No change in urine volume or character  MUSCULOSKELETAL:   pain left foot is manageable,, Gait is difficult with CAM boot  No recent falls.  NEUROLOGIC: No dizziness, fainting, headache. Chronic bipedal numbness  No change in mental status.  PSYCHIATRIC: No feelings of anxiety, depression Sleeps well.    Physical Exam Filed Vitals:   01/06/13 1142  BP: 149/67  Pulse: 70  Temp: 96.5 F (35.8 C)  Resp: 16  Weight: 144 lb 3.2 oz (65.409 kg)   Body mass index is 24 kg/(m^2).  GENERAL APPEARANCE: No acute distress, appropriately groomed, normal body habitus. Alert, pleasant, conversant. SKIN: No diaphoresis, rash, unusual lesions, wounds HEAD: Normocephalic, atraumatic EYES: Conjunctiva/lids clear. Pupils round, reactive.  EARS: Decreased Hearing  NOSE: No deformity or discharge. MOUTH/THROAT: Lips w/o lesions. Oral mucosa, tongue moist, w/o lesion. Oropharynx w/o redness or lesions.  NECK: Supple, full ROM. No thyroid tenderness, enlargement or nodule LYMPHATICS: No head, neck or supraclavicular adenopathy RESPIRATORY: Breathing is even, unlabored. Lung sounds are clear and full.  CARDIOVASCULAR: Heart RRR. No murmur or extra heart sounds MUSCULOSKELETAL: Moves all extremities with full ROM, movement is slow and somewhat stiff, normal strength and tone. Back is without kyphosis, scoliosis or spinal process tenderness. Able to stand to walker with assistance Left foot/ankle in CAM boot, toes pink/warm, move easily.  NEUROLOGIC: Oriented to time, place, person. Speech clear, no tremor.  PSYCHIATRIC: Mood and affect appropriate to situation   ASSESSMENT/PLAN   Fracture, calcaneus closed Pain management satisfactory. Patient continues to be nonweightbearing status through August 20 will then be able to increase weight-bearing as tolerated wearing the  CAM boot. She will remain in the rehabilitation section until regaining independent function with ADLs. She scheduled to return to Dr. Victorino Dike 01/28/2013.  Urinary incontinence Symptoms worsened with missed appointment for PTNS. Arrangements have been made for this procedure tomorrow at [facility] urology.   Follow up: As needed  Bernita Beckstrom T.Anish Vana, NP-C 01/06/2013

## 2013-01-06 NOTE — Assessment & Plan Note (Signed)
Pain management satisfactory. Patient continues to be nonweightbearing status through August 20 will then be able to increase weight-bearing as tolerated wearing the CAM boot. She will remain in the rehabilitation section until regaining independent function with ADLs. She scheduled to return to Dr. Victorino Dike 01/28/2013.

## 2013-01-07 DIAGNOSIS — R3915 Urgency of urination: Secondary | ICD-10-CM | POA: Diagnosis not present

## 2013-01-07 DIAGNOSIS — R350 Frequency of micturition: Secondary | ICD-10-CM | POA: Diagnosis not present

## 2013-01-07 DIAGNOSIS — N3941 Urge incontinence: Secondary | ICD-10-CM | POA: Diagnosis not present

## 2013-01-10 DIAGNOSIS — M6281 Muscle weakness (generalized): Secondary | ICD-10-CM | POA: Diagnosis not present

## 2013-01-10 DIAGNOSIS — R269 Unspecified abnormalities of gait and mobility: Secondary | ICD-10-CM | POA: Diagnosis not present

## 2013-01-10 DIAGNOSIS — Z9181 History of falling: Secondary | ICD-10-CM | POA: Diagnosis not present

## 2013-01-10 DIAGNOSIS — R279 Unspecified lack of coordination: Secondary | ICD-10-CM | POA: Diagnosis not present

## 2013-01-10 DIAGNOSIS — S92009A Unspecified fracture of unspecified calcaneus, initial encounter for closed fracture: Secondary | ICD-10-CM | POA: Diagnosis not present

## 2013-01-12 DIAGNOSIS — Z9181 History of falling: Secondary | ICD-10-CM | POA: Diagnosis not present

## 2013-01-12 DIAGNOSIS — R269 Unspecified abnormalities of gait and mobility: Secondary | ICD-10-CM | POA: Diagnosis not present

## 2013-01-12 DIAGNOSIS — M6281 Muscle weakness (generalized): Secondary | ICD-10-CM | POA: Diagnosis not present

## 2013-01-12 DIAGNOSIS — S92009A Unspecified fracture of unspecified calcaneus, initial encounter for closed fracture: Secondary | ICD-10-CM | POA: Diagnosis not present

## 2013-01-12 DIAGNOSIS — R279 Unspecified lack of coordination: Secondary | ICD-10-CM | POA: Diagnosis not present

## 2013-01-13 DIAGNOSIS — S92009A Unspecified fracture of unspecified calcaneus, initial encounter for closed fracture: Secondary | ICD-10-CM | POA: Diagnosis not present

## 2013-01-13 DIAGNOSIS — M6281 Muscle weakness (generalized): Secondary | ICD-10-CM | POA: Diagnosis not present

## 2013-01-13 DIAGNOSIS — Z9181 History of falling: Secondary | ICD-10-CM | POA: Diagnosis not present

## 2013-01-13 DIAGNOSIS — R269 Unspecified abnormalities of gait and mobility: Secondary | ICD-10-CM | POA: Diagnosis not present

## 2013-01-13 DIAGNOSIS — R279 Unspecified lack of coordination: Secondary | ICD-10-CM | POA: Diagnosis not present

## 2013-01-14 DIAGNOSIS — R279 Unspecified lack of coordination: Secondary | ICD-10-CM | POA: Diagnosis not present

## 2013-01-14 DIAGNOSIS — R269 Unspecified abnormalities of gait and mobility: Secondary | ICD-10-CM | POA: Diagnosis not present

## 2013-01-14 DIAGNOSIS — S92009A Unspecified fracture of unspecified calcaneus, initial encounter for closed fracture: Secondary | ICD-10-CM | POA: Diagnosis not present

## 2013-01-14 DIAGNOSIS — Z9181 History of falling: Secondary | ICD-10-CM | POA: Diagnosis not present

## 2013-01-14 DIAGNOSIS — M6281 Muscle weakness (generalized): Secondary | ICD-10-CM | POA: Diagnosis not present

## 2013-01-17 DIAGNOSIS — R269 Unspecified abnormalities of gait and mobility: Secondary | ICD-10-CM | POA: Diagnosis not present

## 2013-01-17 DIAGNOSIS — M6281 Muscle weakness (generalized): Secondary | ICD-10-CM | POA: Diagnosis not present

## 2013-01-17 DIAGNOSIS — Z9181 History of falling: Secondary | ICD-10-CM | POA: Diagnosis not present

## 2013-01-17 DIAGNOSIS — S92009A Unspecified fracture of unspecified calcaneus, initial encounter for closed fracture: Secondary | ICD-10-CM | POA: Diagnosis not present

## 2013-01-17 DIAGNOSIS — R279 Unspecified lack of coordination: Secondary | ICD-10-CM | POA: Diagnosis not present

## 2013-01-18 ENCOUNTER — Encounter: Payer: Self-pay | Admitting: Geriatric Medicine

## 2013-01-18 ENCOUNTER — Non-Acute Institutional Stay (SKILLED_NURSING_FACILITY): Payer: Medicare Other | Admitting: Geriatric Medicine

## 2013-01-18 DIAGNOSIS — R269 Unspecified abnormalities of gait and mobility: Secondary | ICD-10-CM | POA: Diagnosis not present

## 2013-01-18 DIAGNOSIS — R32 Unspecified urinary incontinence: Secondary | ICD-10-CM | POA: Diagnosis not present

## 2013-01-18 DIAGNOSIS — R279 Unspecified lack of coordination: Secondary | ICD-10-CM | POA: Diagnosis not present

## 2013-01-18 DIAGNOSIS — I10 Essential (primary) hypertension: Secondary | ICD-10-CM

## 2013-01-18 DIAGNOSIS — S92009A Unspecified fracture of unspecified calcaneus, initial encounter for closed fracture: Secondary | ICD-10-CM | POA: Diagnosis not present

## 2013-01-18 DIAGNOSIS — IMO0001 Reserved for inherently not codable concepts without codable children: Secondary | ICD-10-CM

## 2013-01-18 DIAGNOSIS — M6281 Muscle weakness (generalized): Secondary | ICD-10-CM | POA: Diagnosis not present

## 2013-01-18 DIAGNOSIS — Z9181 History of falling: Secondary | ICD-10-CM | POA: Diagnosis not present

## 2013-01-18 DIAGNOSIS — S92002D Unspecified fracture of left calcaneus, subsequent encounter for fracture with routine healing: Secondary | ICD-10-CM

## 2013-01-18 NOTE — Assessment & Plan Note (Signed)
Symptoms improved with resumption of PTNS treatment. Continue every 3 week schedule

## 2013-01-18 NOTE — Progress Notes (Signed)
Patient ID: Tara Walters, female   DOB: [birth year], 79 y.o.   MRN: [id number] [facility] (31)  .Code Status:  Living Will, DNR Contact Information   Name Relation Home Work Mobile   Reed,April Daughter [phone]  [phone]   Reed,Tilden Relative [phone]  [phone]   Daisy Lazar Relative [phone]  [phone]      Chief Complaint  Patient presents with  . Ankle Injury    HPI: This is  79 y.o. female resident of [facility], currently residing in the rehabilitation section due to a left heel fracture.  Left foot/ankle X-ray 12/23/12 revealed calcaneal fracture. CT scan of the foot and ankle confirmed the fracture.  Patient was placed in a CAM boot on August 1 for immobilization of the fracture. She followed up with Dr. Victorino Dike in his office August 6, he recommended continued nonweightbearing of the left lower extremity for 2 more weeks.  Patient was cleared for WBAT status 01/13/13, she has experienced increased heel / foot pain with full weight bearing. Patient continues to work with physical and occupational therapies, is making some progress regarding mobilization with partial weightbearing status on the left leg. She continues to require skilled level of care for assistance with all ADLs.   Patient returned to urology for PTNS tx 01/07/13, notes improvement in symptoms; decreased frequency and improved bladder emptying.   Review of record shows patient's vital signs including weight have been stable, she is eating well, sleeping adequately.   No Known Allergies Medication list reviewed  DATA REVIEWED   Radiologic Exams: [facility] mobile x-ray   08/05/2012 right foot x-ray: Mild hallux valgus deformity of foot. Mild osteoarthritis at the base of the toes. No acute fracture or other injury. No evidence for osteomyelitis   11/29/2012 X-ray of left ankle mild diffuse osteopenia. No acute fracture, malalignment or lytic  destructive lesion. Mild soft tissue swelling seen diffusely. No evidence for osteomyelitis  Left foot x-ray, 3 views minimal hallux valgus deformity. Mild osteoarthritis of the distal toes. No subluxation, dislocation or lytic destructive lesion. Old healed deformity at the distal metaphysis of the fifth metatarsal, first distal phalanx, third distal phalanx. No evidence of osteomyelitis   [facility] 12/23/2012 left ankle complete 3+ view: Impression. Nondisplaced fracture of the posterior aspect of the calcaneus. Consider CT to assess further. Osteopenia  Left foot complete 3+ view: Impression fracture of the posterior calcaneus without significant displacement. Nondisplaced fracture of the left fifth metatarsal neck   12/28/2012 CT OF THE LEFT ANKLE WITHOUT CONTRAST: IMPRESSION:Mildly displaced fracture of the calcaneal tuberosity appears unchanged from recent radiographs.  The appearance suggests a subacute insufficiency fracture.  There is no involvement of the subtalar joint.   Cardiovascular Exams:  06/2012 Carotid Doppler / ABI exam results reviewed  08/05/2012 right lower extremity venous Doppler exam: No evidence of DVT   Laboratory Studies: [facility] lab, external  07/15/2011 Vitamin B12 474, Folate 14.0  03/03/2012 CMP: Sodium 137, Potassium 4.5, glucose 67, BUN 21, Creatinine 0.72   CBC Wbc 3.5, Rbc 4.36, Hgb 12.2, Hct 35.9   TSH 2.659  04/08/2012 Urine negative   Urine culture no growth   Vitamin B -12 420  08/19/2012: WBC 4.9, hemoglobin 11.6, hematocrit 34.7, platelets 220   Glucose 79, BUN 23, creatinine 0.84, sodium 138, potassium 4.4. Protein/LFTs WNL   TSH 2.74   Review of Systems  DATA OBTAINED: from patient, nurse,  medical record GENERAL: Feels well   No fevers, fatigue, change in appetite or  weight SKIN: No itch, rash or open wounds EYES: No eye pain, dryness or itching  No change in vision EARS: No earache, tinnitus, change in hearing NOSE: No congestion,  drainage or bleeding MOUTH/THROAT: No mouth or tooth pain  No sore throat No difficulty chewing or swallowing RESPIRATORY: No cough, wheezing, SOB CARDIAC: No chest pain, palpitations  No edema. GI: No abdominal pain  No N/V/D or constipation  No heartburn or reflux  GU: No dysuria. Less nighttime frequency  No change in urine volume or character  MUSCULOSKELETAL:   pain left foot is manageable, Gait is difficult with CAM boot  No recent falls.  NEUROLOGIC: No dizziness, fainting, headache. Chronic bipedal numbness  No change in mental status.  PSYCHIATRIC: No feelings of anxiety, depression Sleeps well.    Physical Exam Filed Vitals:   01/18/13 1422  BP: 151/66  Pulse: 63  Weight: 148 lb 9.6 oz (67.405 kg)   Body mass index is 24.73 kg/(m^2).  GENERAL APPEARANCE: No acute distress, appropriately groomed, normal body habitus. Alert, pleasant, conversant. SKIN: No diaphoresis, rash, unusual lesions, wounds HEAD: Normocephalic, atraumatic EYES: Conjunctiva/lids clear. Pupils round, reactive.  EARS: Decreased Hearing  NOSE: No deformity or discharge. MOUTH/THROAT: Lips w/o lesions. Oral mucosa, tongue moist, w/o lesion. Oropharynx w/o redness or lesions.  NECK: Supple, full ROM. No thyroid tenderness, enlargement or nodule LYMPHATICS: No head, neck or supraclavicular adenopathy RESPIRATORY: Breathing is even, unlabored. Lung sounds are clear and full.  CARDIOVASCULAR: Heart RRR. No murmur or extra heart sounds MUSCULOSKELETAL: Moves all extremities with full ROM, movement is slow and somewhat stiff, normal strength and tone. Back is without kyphosis, scoliosis or spinal process tenderness. Able to stand to walker with assistance Left foot/ankle in CAM boot, toes pink/warm, move easily.  NEUROLOGIC: Oriented to time, place, person. Speech clear, no tremor.  PSYCHIATRIC: Mood and affect appropriate to situation   ASSESSMENT/PLAN   HTN (hypertension) Blood pressure remains well  controlled on current medications  Fracture, calcaneus closed Patient experiencing increased pain with full weightbearing on left foot. She's backed off on weightbearing status, continues with immobilization in CAM boot. She's scheduled for return to Dr. Victorino Dike on 01/28/2013. Will continue working with PT and OT as she is able.  Urinary incontinence Symptoms improved with resumption of PTNS treatment. Continue every 3 week schedule   Goal: Return to assisted living apartment  Follow up: As needed  Danie Diehl T.Aadarsh Cozort, NP-C 01/18/2013

## 2013-01-18 NOTE — Assessment & Plan Note (Signed)
Blood pressure remains well controlled on current medications.

## 2013-01-18 NOTE — Assessment & Plan Note (Signed)
Patient experiencing increased pain with full weightbearing on left foot. She's backed off on weightbearing status, continues with immobilization in CAM boot. She's scheduled for return to Dr. Victorino Dike on 01/28/2013. Will continue working with PT and OT as she is able.

## 2013-01-19 ENCOUNTER — Ambulatory Visit: Payer: Medicare Other | Admitting: Neurosurgery

## 2013-01-19 ENCOUNTER — Other Ambulatory Visit: Payer: Medicare Other

## 2013-01-19 DIAGNOSIS — R269 Unspecified abnormalities of gait and mobility: Secondary | ICD-10-CM | POA: Diagnosis not present

## 2013-01-19 DIAGNOSIS — S92009A Unspecified fracture of unspecified calcaneus, initial encounter for closed fracture: Secondary | ICD-10-CM | POA: Diagnosis not present

## 2013-01-19 DIAGNOSIS — M6281 Muscle weakness (generalized): Secondary | ICD-10-CM | POA: Diagnosis not present

## 2013-01-19 DIAGNOSIS — R279 Unspecified lack of coordination: Secondary | ICD-10-CM | POA: Diagnosis not present

## 2013-01-19 DIAGNOSIS — Z9181 History of falling: Secondary | ICD-10-CM | POA: Diagnosis not present

## 2013-01-20 DIAGNOSIS — R279 Unspecified lack of coordination: Secondary | ICD-10-CM | POA: Diagnosis not present

## 2013-01-20 DIAGNOSIS — M6281 Muscle weakness (generalized): Secondary | ICD-10-CM | POA: Diagnosis not present

## 2013-01-20 DIAGNOSIS — R269 Unspecified abnormalities of gait and mobility: Secondary | ICD-10-CM | POA: Diagnosis not present

## 2013-01-20 DIAGNOSIS — S92009A Unspecified fracture of unspecified calcaneus, initial encounter for closed fracture: Secondary | ICD-10-CM | POA: Diagnosis not present

## 2013-01-20 DIAGNOSIS — Z9181 History of falling: Secondary | ICD-10-CM | POA: Diagnosis not present

## 2013-01-21 DIAGNOSIS — S92009A Unspecified fracture of unspecified calcaneus, initial encounter for closed fracture: Secondary | ICD-10-CM | POA: Diagnosis not present

## 2013-01-21 DIAGNOSIS — Z9181 History of falling: Secondary | ICD-10-CM | POA: Diagnosis not present

## 2013-01-21 DIAGNOSIS — M6281 Muscle weakness (generalized): Secondary | ICD-10-CM | POA: Diagnosis not present

## 2013-01-21 DIAGNOSIS — R269 Unspecified abnormalities of gait and mobility: Secondary | ICD-10-CM | POA: Diagnosis not present

## 2013-01-21 DIAGNOSIS — R279 Unspecified lack of coordination: Secondary | ICD-10-CM | POA: Diagnosis not present

## 2013-01-24 DIAGNOSIS — R269 Unspecified abnormalities of gait and mobility: Secondary | ICD-10-CM | POA: Diagnosis not present

## 2013-01-24 DIAGNOSIS — R279 Unspecified lack of coordination: Secondary | ICD-10-CM | POA: Diagnosis not present

## 2013-01-24 DIAGNOSIS — Z9181 History of falling: Secondary | ICD-10-CM | POA: Diagnosis not present

## 2013-01-24 DIAGNOSIS — M25569 Pain in unspecified knee: Secondary | ICD-10-CM | POA: Diagnosis not present

## 2013-01-24 DIAGNOSIS — M6281 Muscle weakness (generalized): Secondary | ICD-10-CM | POA: Diagnosis not present

## 2013-01-24 DIAGNOSIS — S92009A Unspecified fracture of unspecified calcaneus, initial encounter for closed fracture: Secondary | ICD-10-CM | POA: Diagnosis not present

## 2013-01-25 DIAGNOSIS — R269 Unspecified abnormalities of gait and mobility: Secondary | ICD-10-CM | POA: Diagnosis not present

## 2013-01-25 DIAGNOSIS — M25569 Pain in unspecified knee: Secondary | ICD-10-CM | POA: Diagnosis not present

## 2013-01-25 DIAGNOSIS — M6281 Muscle weakness (generalized): Secondary | ICD-10-CM | POA: Diagnosis not present

## 2013-01-25 DIAGNOSIS — Z9181 History of falling: Secondary | ICD-10-CM | POA: Diagnosis not present

## 2013-01-25 DIAGNOSIS — S92009A Unspecified fracture of unspecified calcaneus, initial encounter for closed fracture: Secondary | ICD-10-CM | POA: Diagnosis not present

## 2013-01-25 DIAGNOSIS — R279 Unspecified lack of coordination: Secondary | ICD-10-CM | POA: Diagnosis not present

## 2013-01-26 DIAGNOSIS — R269 Unspecified abnormalities of gait and mobility: Secondary | ICD-10-CM | POA: Diagnosis not present

## 2013-01-26 DIAGNOSIS — M6281 Muscle weakness (generalized): Secondary | ICD-10-CM | POA: Diagnosis not present

## 2013-01-26 DIAGNOSIS — Z9181 History of falling: Secondary | ICD-10-CM | POA: Diagnosis not present

## 2013-01-26 DIAGNOSIS — M25569 Pain in unspecified knee: Secondary | ICD-10-CM | POA: Diagnosis not present

## 2013-01-26 DIAGNOSIS — S92009A Unspecified fracture of unspecified calcaneus, initial encounter for closed fracture: Secondary | ICD-10-CM | POA: Diagnosis not present

## 2013-01-26 DIAGNOSIS — R279 Unspecified lack of coordination: Secondary | ICD-10-CM | POA: Diagnosis not present

## 2013-01-27 DIAGNOSIS — N3941 Urge incontinence: Secondary | ICD-10-CM | POA: Diagnosis not present

## 2013-01-27 DIAGNOSIS — R350 Frequency of micturition: Secondary | ICD-10-CM | POA: Diagnosis not present

## 2013-01-27 DIAGNOSIS — R3915 Urgency of urination: Secondary | ICD-10-CM | POA: Diagnosis not present

## 2013-01-28 DIAGNOSIS — M25569 Pain in unspecified knee: Secondary | ICD-10-CM | POA: Diagnosis not present

## 2013-01-28 DIAGNOSIS — S92009A Unspecified fracture of unspecified calcaneus, initial encounter for closed fracture: Secondary | ICD-10-CM | POA: Diagnosis not present

## 2013-01-28 DIAGNOSIS — M6281 Muscle weakness (generalized): Secondary | ICD-10-CM | POA: Diagnosis not present

## 2013-01-28 DIAGNOSIS — R279 Unspecified lack of coordination: Secondary | ICD-10-CM | POA: Diagnosis not present

## 2013-01-28 DIAGNOSIS — Z9181 History of falling: Secondary | ICD-10-CM | POA: Diagnosis not present

## 2013-01-28 DIAGNOSIS — R269 Unspecified abnormalities of gait and mobility: Secondary | ICD-10-CM | POA: Diagnosis not present

## 2013-01-31 ENCOUNTER — Encounter: Payer: Self-pay | Admitting: Geriatric Medicine

## 2013-01-31 ENCOUNTER — Non-Acute Institutional Stay (SKILLED_NURSING_FACILITY): Payer: Medicare Other | Admitting: Geriatric Medicine

## 2013-01-31 DIAGNOSIS — IMO0001 Reserved for inherently not codable concepts without codable children: Secondary | ICD-10-CM | POA: Diagnosis not present

## 2013-01-31 DIAGNOSIS — Z9181 History of falling: Secondary | ICD-10-CM | POA: Diagnosis not present

## 2013-01-31 DIAGNOSIS — M25569 Pain in unspecified knee: Secondary | ICD-10-CM | POA: Diagnosis not present

## 2013-01-31 DIAGNOSIS — S92009A Unspecified fracture of unspecified calcaneus, initial encounter for closed fracture: Secondary | ICD-10-CM | POA: Diagnosis not present

## 2013-01-31 DIAGNOSIS — S92002D Unspecified fracture of left calcaneus, subsequent encounter for fracture with routine healing: Secondary | ICD-10-CM

## 2013-01-31 DIAGNOSIS — I10 Essential (primary) hypertension: Secondary | ICD-10-CM

## 2013-01-31 DIAGNOSIS — G629 Polyneuropathy, unspecified: Secondary | ICD-10-CM

## 2013-01-31 DIAGNOSIS — R279 Unspecified lack of coordination: Secondary | ICD-10-CM | POA: Diagnosis not present

## 2013-01-31 DIAGNOSIS — G609 Hereditary and idiopathic neuropathy, unspecified: Secondary | ICD-10-CM

## 2013-01-31 DIAGNOSIS — R269 Unspecified abnormalities of gait and mobility: Secondary | ICD-10-CM | POA: Diagnosis not present

## 2013-01-31 DIAGNOSIS — M6281 Muscle weakness (generalized): Secondary | ICD-10-CM | POA: Diagnosis not present

## 2013-01-31 NOTE — Progress Notes (Deleted)
Patient ID: Tara Walters, female   DOB: 23-Mar-1922, 79 y.o.   MRN: [id number]

## 2013-01-31 NOTE — Progress Notes (Signed)
Patient ID: AMARII AMY, female   DOB: 12-06-1921, 79 y.o.   MRN: [id number] [facility] (31)  .Code Status:  Living Will, DNR Contact Information   Name Relation Home Work Mobile   Reed,April Daughter [phone]  [phone]   Reed,Tilden Relative [phone]  [phone]   Daisy Lazar Relative [phone]  [phone]      Chief Complaint  Patient presents with  . Discharge Note    Rehab discharge    HPI: This is  79 y.o. female resident of [facility] iscurrently residing in the rehabilitation section due to a left heel fracture.  Left foot/ankle X-ray 12/23/12 revealed calcaneal fracture. CT scan of the foot and ankle confirmed the fracture.  Patient was placed in a CAM boot on August 1 for immobilization of the fracture. She followed up with Dr. Victorino Dike in his office August 6, he recommended continued nonweightbearing of the left lower extremity for 2 more weeks then advance to [facility]. Patient return to Dr. Victorino Dike for further evaluation on September 5, he was satisfied with healing on x-ray, recommendede continued weight-bear as tolerated status. She may be out of the boot for short periods of weightbearing; in the shower. Patient continues to work with physical and occupational therapies, is making good progress regarding mobilization and ADLs.  She is scheduled for home evaluation with OT to determine if she has any additional needs in her Assisted Living apartment. Anticipate the patient will be ready for return to her Assisted-Living apartment by the end of this week.   Review of record shows patient's vital signs including weight have been stable, she is eating well, sleeping adequately.   No Known Allergies Medication list reviewed  DATA REVIEWED   Radiologic Exams: [facility] x-ray   08/05/2012 right foot x-ray: Mild hallux valgus deformity of foot. Mild osteoarthritis at the base of the toes. No acute fracture or other  injury. No evidence for osteomyelitis   11/29/2012 X-ray of left ankle mild diffuse osteopenia. No acute fracture, malalignment or lytic destructive lesion. Mild soft tissue swelling seen diffusely. No evidence for osteomyelitis  Left foot x-ray, 3 views minimal hallux valgus deformity. Mild osteoarthritis of the distal toes. No subluxation, dislocation or lytic destructive lesion. Old healed deformity at the distal metaphysis of the fifth metatarsal, first distal phalanx, third distal phalanx. No evidence of osteomyelitis   [facility] 12/23/2012 left ankle complete 3+ view: Impression. Nondisplaced fracture of the posterior aspect of the calcaneus. Consider CT to assess further. Osteopenia  Left foot complete 3+ view: Impression fracture of the posterior calcaneus without significant displacement. Nondisplaced fracture of the left fifth metatarsal neck   12/28/2012 CT OF THE LEFT ANKLE WITHOUT CONTRAST: IMPRESSION:Mildly displaced fracture of the calcaneal tuberosity appears unchanged from recent radiographs.  The appearance suggests a subacute insufficiency fracture.  There is no involvement of the subtalar joint.   Cardiovascular Exams:  06/2012 Carotid Doppler / ABI exam results reviewed  08/05/2012 right lower extremity venous Doppler exam: No evidence of DVT   Laboratory Studies: [facility] lab, external  07/15/2011 Vitamin B12 474, Folate 14.0  03/03/2012 CMP: Sodium 137, Potassium 4.5, glucose 67, BUN 21, Creatinine 0.72   CBC Wbc 3.5, Rbc 4.36, Hgb 12.2, Hct 35.9   TSH 2.659  04/08/2012 Urine negative   Urine culture no growth   Vitamin B -12 420  08/19/2012: WBC 4.9, hemoglobin 11.6, hematocrit 34.7, platelets 220   Glucose 79, BUN 23, creatinine 0.84, sodium 138, potassium 4.4. Protein/LFTs WNL  TSH 2.74   Review of Systems  DATA OBTAINED: from patient, nurse,  medical record GENERAL: Feels well   No fevers, fatigue, change in appetite or weight SKIN: No itch, rash or  open wounds EYES: No eye pain, dryness or itching  No change in vision EARS: No earache, tinnitus, change in hearing NOSE: No congestion, drainage or bleeding MOUTH/THROAT: No mouth or tooth pain  No sore throat No difficulty chewing or swallowing RESPIRATORY: No cough, wheezing, SOB CARDIAC: No chest pain, palpitations  No edema. GI: No abdominal pain  No N/V/D or constipation  No heartburn or reflux  GU: No dysuria. Less nighttime frequency  No change in urine volume or character  MUSCULOSKELETAL:   Minimal pain left foot, improved Gait with CAM boot  No recent falls.  NEUROLOGIC: No dizziness, fainting, headache. Chronic bipedal numbness  No change in mental status.  PSYCHIATRIC: No feelings of anxiety, depression Sleeps well.    Physical Exam Filed Vitals:   01/31/13 1329  BP: 145/65  Pulse: 65  Weight: 150 lb (68.04 kg)   Body mass index is 24.96 kg/(m^2).  GENERAL APPEARANCE: No acute distress, appropriately groomed, normal body habitus. Alert, pleasant, conversant. SKIN: No diaphoresis, rash, unusual lesions, wounds HEAD: Normocephalic, atraumatic EYES: Conjunctiva/lids clear. Pupils round, reactive.  EARS: Decreased Hearing  NOSE: No deformity or discharge. MOUTH/THROAT: Lips w/o lesions. Oral mucosa, tongue moist, w/o lesion. Oropharynx w/o redness or lesions.  NECK: Supple, full ROM. No thyroid tenderness, enlargement or nodule LYMPHATICS: No head, neck or supraclavicular adenopathy RESPIRATORY: Breathing is even, unlabored. Lung sounds are clear and full.  CARDIOVASCULAR: Heart RRR. No murmur or extra heart sounds MUSCULOSKELETAL: Moves all extremities with full ROM, movement is slow and somewhat stiff, normal strength and tone. Back is without kyphosis, scoliosis or spinal process tenderness. Observed ambulating easily and safely in hallway with walker/CAM boot on NEUROLOGIC: Oriented to time, place, person. Speech clear, no tremor.  PSYCHIATRIC: Mood and affect  appropriate to situation   ASSESSMENT/PLAN   HTN (hypertension) Blood pressures remained well controlled during her this patient's rehabilitation stay, continue current medications. Follow lab at intervals.  Neuropathy, peripheral Abnormal and uncomfortable feelings persist in both patient's feet. No specific interventions, continue to ambulate with walker.  Fracture, calcaneus closed Fractures healing well per report from Dr. Victorino Dike. Patient is ambulating safely, regaining independent status with ADLs. Anticipate patient will return to her assisted-living apartment later this week.   Plan:  Return to assisted living apartment  Follow up: As scheduled with Dr.Green in [facility]  Melba Araki T.Abbi Mancini, NP-C 01/31/2013

## 2013-02-01 DIAGNOSIS — S92009A Unspecified fracture of unspecified calcaneus, initial encounter for closed fracture: Secondary | ICD-10-CM | POA: Diagnosis not present

## 2013-02-01 DIAGNOSIS — R269 Unspecified abnormalities of gait and mobility: Secondary | ICD-10-CM | POA: Diagnosis not present

## 2013-02-01 DIAGNOSIS — M25569 Pain in unspecified knee: Secondary | ICD-10-CM | POA: Diagnosis not present

## 2013-02-01 DIAGNOSIS — Z9181 History of falling: Secondary | ICD-10-CM | POA: Diagnosis not present

## 2013-02-01 DIAGNOSIS — R279 Unspecified lack of coordination: Secondary | ICD-10-CM | POA: Diagnosis not present

## 2013-02-01 DIAGNOSIS — M6281 Muscle weakness (generalized): Secondary | ICD-10-CM | POA: Diagnosis not present

## 2013-02-02 DIAGNOSIS — S92009A Unspecified fracture of unspecified calcaneus, initial encounter for closed fracture: Secondary | ICD-10-CM | POA: Diagnosis not present

## 2013-02-02 DIAGNOSIS — Z9181 History of falling: Secondary | ICD-10-CM | POA: Diagnosis not present

## 2013-02-02 DIAGNOSIS — R269 Unspecified abnormalities of gait and mobility: Secondary | ICD-10-CM | POA: Diagnosis not present

## 2013-02-02 DIAGNOSIS — R279 Unspecified lack of coordination: Secondary | ICD-10-CM | POA: Diagnosis not present

## 2013-02-02 DIAGNOSIS — M6281 Muscle weakness (generalized): Secondary | ICD-10-CM | POA: Diagnosis not present

## 2013-02-02 DIAGNOSIS — M25569 Pain in unspecified knee: Secondary | ICD-10-CM | POA: Diagnosis not present

## 2013-02-03 DIAGNOSIS — Z9181 History of falling: Secondary | ICD-10-CM | POA: Diagnosis not present

## 2013-02-03 DIAGNOSIS — R279 Unspecified lack of coordination: Secondary | ICD-10-CM | POA: Diagnosis not present

## 2013-02-03 DIAGNOSIS — R269 Unspecified abnormalities of gait and mobility: Secondary | ICD-10-CM | POA: Diagnosis not present

## 2013-02-03 DIAGNOSIS — M6281 Muscle weakness (generalized): Secondary | ICD-10-CM | POA: Diagnosis not present

## 2013-02-03 DIAGNOSIS — M25569 Pain in unspecified knee: Secondary | ICD-10-CM | POA: Diagnosis not present

## 2013-02-03 DIAGNOSIS — S92009A Unspecified fracture of unspecified calcaneus, initial encounter for closed fracture: Secondary | ICD-10-CM | POA: Diagnosis not present

## 2013-02-04 DIAGNOSIS — M6281 Muscle weakness (generalized): Secondary | ICD-10-CM | POA: Diagnosis not present

## 2013-02-04 DIAGNOSIS — R269 Unspecified abnormalities of gait and mobility: Secondary | ICD-10-CM | POA: Diagnosis not present

## 2013-02-04 DIAGNOSIS — S92009A Unspecified fracture of unspecified calcaneus, initial encounter for closed fracture: Secondary | ICD-10-CM | POA: Diagnosis not present

## 2013-02-04 DIAGNOSIS — Z9181 History of falling: Secondary | ICD-10-CM | POA: Diagnosis not present

## 2013-02-04 DIAGNOSIS — R279 Unspecified lack of coordination: Secondary | ICD-10-CM | POA: Diagnosis not present

## 2013-02-04 DIAGNOSIS — M25569 Pain in unspecified knee: Secondary | ICD-10-CM | POA: Diagnosis not present

## 2013-02-05 NOTE — Assessment & Plan Note (Signed)
Blood pressures remained well controlled during her this patient's rehabilitation stay, continue current medications. Follow lab at intervals.

## 2013-02-05 NOTE — Assessment & Plan Note (Signed)
Abnormal and uncomfortable feelings persist in both patient's feet. No specific interventions, continue to ambulate with walker.

## 2013-02-05 NOTE — Assessment & Plan Note (Signed)
Fractures healing well per report from Dr. Victorino Dike. Patient is ambulating safely, regaining independent status with ADLs. Anticipate patient will return to her assisted-living apartment later this week.

## 2013-02-07 DIAGNOSIS — M6281 Muscle weakness (generalized): Secondary | ICD-10-CM | POA: Diagnosis not present

## 2013-02-07 DIAGNOSIS — R269 Unspecified abnormalities of gait and mobility: Secondary | ICD-10-CM | POA: Diagnosis not present

## 2013-02-07 DIAGNOSIS — Z9181 History of falling: Secondary | ICD-10-CM | POA: Diagnosis not present

## 2013-02-07 DIAGNOSIS — M25569 Pain in unspecified knee: Secondary | ICD-10-CM | POA: Diagnosis not present

## 2013-02-07 DIAGNOSIS — R279 Unspecified lack of coordination: Secondary | ICD-10-CM | POA: Diagnosis not present

## 2013-02-07 DIAGNOSIS — S92009A Unspecified fracture of unspecified calcaneus, initial encounter for closed fracture: Secondary | ICD-10-CM | POA: Diagnosis not present

## 2013-02-08 DIAGNOSIS — R269 Unspecified abnormalities of gait and mobility: Secondary | ICD-10-CM | POA: Diagnosis not present

## 2013-02-08 DIAGNOSIS — M6281 Muscle weakness (generalized): Secondary | ICD-10-CM | POA: Diagnosis not present

## 2013-02-08 DIAGNOSIS — S92009A Unspecified fracture of unspecified calcaneus, initial encounter for closed fracture: Secondary | ICD-10-CM | POA: Diagnosis not present

## 2013-02-08 DIAGNOSIS — M25569 Pain in unspecified knee: Secondary | ICD-10-CM | POA: Diagnosis not present

## 2013-02-08 DIAGNOSIS — R279 Unspecified lack of coordination: Secondary | ICD-10-CM | POA: Diagnosis not present

## 2013-02-08 DIAGNOSIS — Z9181 History of falling: Secondary | ICD-10-CM | POA: Diagnosis not present

## 2013-02-09 DIAGNOSIS — M25569 Pain in unspecified knee: Secondary | ICD-10-CM | POA: Diagnosis not present

## 2013-02-09 DIAGNOSIS — Z9181 History of falling: Secondary | ICD-10-CM | POA: Diagnosis not present

## 2013-02-09 DIAGNOSIS — S92009A Unspecified fracture of unspecified calcaneus, initial encounter for closed fracture: Secondary | ICD-10-CM | POA: Diagnosis not present

## 2013-02-09 DIAGNOSIS — M6281 Muscle weakness (generalized): Secondary | ICD-10-CM | POA: Diagnosis not present

## 2013-02-09 DIAGNOSIS — R269 Unspecified abnormalities of gait and mobility: Secondary | ICD-10-CM | POA: Diagnosis not present

## 2013-02-09 DIAGNOSIS — R279 Unspecified lack of coordination: Secondary | ICD-10-CM | POA: Diagnosis not present

## 2013-02-10 DIAGNOSIS — R269 Unspecified abnormalities of gait and mobility: Secondary | ICD-10-CM | POA: Diagnosis not present

## 2013-02-10 DIAGNOSIS — S92009A Unspecified fracture of unspecified calcaneus, initial encounter for closed fracture: Secondary | ICD-10-CM | POA: Diagnosis not present

## 2013-02-10 DIAGNOSIS — M6281 Muscle weakness (generalized): Secondary | ICD-10-CM | POA: Diagnosis not present

## 2013-02-10 DIAGNOSIS — M25569 Pain in unspecified knee: Secondary | ICD-10-CM | POA: Diagnosis not present

## 2013-02-10 DIAGNOSIS — R279 Unspecified lack of coordination: Secondary | ICD-10-CM | POA: Diagnosis not present

## 2013-02-10 DIAGNOSIS — Z9181 History of falling: Secondary | ICD-10-CM | POA: Diagnosis not present

## 2013-02-11 DIAGNOSIS — M6281 Muscle weakness (generalized): Secondary | ICD-10-CM | POA: Diagnosis not present

## 2013-02-11 DIAGNOSIS — S92009A Unspecified fracture of unspecified calcaneus, initial encounter for closed fracture: Secondary | ICD-10-CM | POA: Diagnosis not present

## 2013-02-11 DIAGNOSIS — M25569 Pain in unspecified knee: Secondary | ICD-10-CM | POA: Diagnosis not present

## 2013-02-11 DIAGNOSIS — Z9181 History of falling: Secondary | ICD-10-CM | POA: Diagnosis not present

## 2013-02-11 DIAGNOSIS — R269 Unspecified abnormalities of gait and mobility: Secondary | ICD-10-CM | POA: Diagnosis not present

## 2013-02-11 DIAGNOSIS — R279 Unspecified lack of coordination: Secondary | ICD-10-CM | POA: Diagnosis not present

## 2013-02-13 DIAGNOSIS — M25569 Pain in unspecified knee: Secondary | ICD-10-CM | POA: Diagnosis not present

## 2013-02-13 DIAGNOSIS — M6281 Muscle weakness (generalized): Secondary | ICD-10-CM | POA: Diagnosis not present

## 2013-02-13 DIAGNOSIS — Z9181 History of falling: Secondary | ICD-10-CM | POA: Diagnosis not present

## 2013-02-13 DIAGNOSIS — R269 Unspecified abnormalities of gait and mobility: Secondary | ICD-10-CM | POA: Diagnosis not present

## 2013-02-13 DIAGNOSIS — R279 Unspecified lack of coordination: Secondary | ICD-10-CM | POA: Diagnosis not present

## 2013-02-13 DIAGNOSIS — S92009A Unspecified fracture of unspecified calcaneus, initial encounter for closed fracture: Secondary | ICD-10-CM | POA: Diagnosis not present

## 2013-02-14 DIAGNOSIS — R269 Unspecified abnormalities of gait and mobility: Secondary | ICD-10-CM | POA: Diagnosis not present

## 2013-02-14 DIAGNOSIS — M6281 Muscle weakness (generalized): Secondary | ICD-10-CM | POA: Diagnosis not present

## 2013-02-14 DIAGNOSIS — M25569 Pain in unspecified knee: Secondary | ICD-10-CM | POA: Diagnosis not present

## 2013-02-14 DIAGNOSIS — R279 Unspecified lack of coordination: Secondary | ICD-10-CM | POA: Diagnosis not present

## 2013-02-14 DIAGNOSIS — S92009A Unspecified fracture of unspecified calcaneus, initial encounter for closed fracture: Secondary | ICD-10-CM | POA: Diagnosis not present

## 2013-02-14 DIAGNOSIS — Z9181 History of falling: Secondary | ICD-10-CM | POA: Diagnosis not present

## 2013-02-15 DIAGNOSIS — R269 Unspecified abnormalities of gait and mobility: Secondary | ICD-10-CM | POA: Diagnosis not present

## 2013-02-15 DIAGNOSIS — M6281 Muscle weakness (generalized): Secondary | ICD-10-CM | POA: Diagnosis not present

## 2013-02-15 DIAGNOSIS — M25569 Pain in unspecified knee: Secondary | ICD-10-CM | POA: Diagnosis not present

## 2013-02-15 DIAGNOSIS — Z9181 History of falling: Secondary | ICD-10-CM | POA: Diagnosis not present

## 2013-02-15 DIAGNOSIS — R279 Unspecified lack of coordination: Secondary | ICD-10-CM | POA: Diagnosis not present

## 2013-02-15 DIAGNOSIS — S92009A Unspecified fracture of unspecified calcaneus, initial encounter for closed fracture: Secondary | ICD-10-CM | POA: Diagnosis not present

## 2013-02-16 DIAGNOSIS — M6281 Muscle weakness (generalized): Secondary | ICD-10-CM | POA: Diagnosis not present

## 2013-02-16 DIAGNOSIS — R279 Unspecified lack of coordination: Secondary | ICD-10-CM | POA: Diagnosis not present

## 2013-02-16 DIAGNOSIS — Z9181 History of falling: Secondary | ICD-10-CM | POA: Diagnosis not present

## 2013-02-16 DIAGNOSIS — M25569 Pain in unspecified knee: Secondary | ICD-10-CM | POA: Diagnosis not present

## 2013-02-16 DIAGNOSIS — S92009A Unspecified fracture of unspecified calcaneus, initial encounter for closed fracture: Secondary | ICD-10-CM | POA: Diagnosis not present

## 2013-02-16 DIAGNOSIS — R269 Unspecified abnormalities of gait and mobility: Secondary | ICD-10-CM | POA: Diagnosis not present

## 2013-02-17 DIAGNOSIS — Z9181 History of falling: Secondary | ICD-10-CM | POA: Diagnosis not present

## 2013-02-17 DIAGNOSIS — R279 Unspecified lack of coordination: Secondary | ICD-10-CM | POA: Diagnosis not present

## 2013-02-17 DIAGNOSIS — R3915 Urgency of urination: Secondary | ICD-10-CM | POA: Diagnosis not present

## 2013-02-17 DIAGNOSIS — M6281 Muscle weakness (generalized): Secondary | ICD-10-CM | POA: Diagnosis not present

## 2013-02-17 DIAGNOSIS — M25569 Pain in unspecified knee: Secondary | ICD-10-CM | POA: Diagnosis not present

## 2013-02-17 DIAGNOSIS — S92009A Unspecified fracture of unspecified calcaneus, initial encounter for closed fracture: Secondary | ICD-10-CM | POA: Diagnosis not present

## 2013-02-17 DIAGNOSIS — N3941 Urge incontinence: Secondary | ICD-10-CM | POA: Diagnosis not present

## 2013-02-17 DIAGNOSIS — R350 Frequency of micturition: Secondary | ICD-10-CM | POA: Diagnosis not present

## 2013-02-17 DIAGNOSIS — R269 Unspecified abnormalities of gait and mobility: Secondary | ICD-10-CM | POA: Diagnosis not present

## 2013-02-21 DIAGNOSIS — M6281 Muscle weakness (generalized): Secondary | ICD-10-CM | POA: Diagnosis not present

## 2013-02-21 DIAGNOSIS — Z9181 History of falling: Secondary | ICD-10-CM | POA: Diagnosis not present

## 2013-02-21 DIAGNOSIS — R279 Unspecified lack of coordination: Secondary | ICD-10-CM | POA: Diagnosis not present

## 2013-02-21 DIAGNOSIS — S92009A Unspecified fracture of unspecified calcaneus, initial encounter for closed fracture: Secondary | ICD-10-CM | POA: Diagnosis not present

## 2013-02-21 DIAGNOSIS — M25569 Pain in unspecified knee: Secondary | ICD-10-CM | POA: Diagnosis not present

## 2013-02-21 DIAGNOSIS — R269 Unspecified abnormalities of gait and mobility: Secondary | ICD-10-CM | POA: Diagnosis not present

## 2013-02-23 DIAGNOSIS — R279 Unspecified lack of coordination: Secondary | ICD-10-CM | POA: Diagnosis not present

## 2013-02-23 DIAGNOSIS — M25569 Pain in unspecified knee: Secondary | ICD-10-CM | POA: Diagnosis not present

## 2013-02-23 DIAGNOSIS — S92009A Unspecified fracture of unspecified calcaneus, initial encounter for closed fracture: Secondary | ICD-10-CM | POA: Diagnosis not present

## 2013-02-23 DIAGNOSIS — M6281 Muscle weakness (generalized): Secondary | ICD-10-CM | POA: Diagnosis not present

## 2013-02-23 DIAGNOSIS — R269 Unspecified abnormalities of gait and mobility: Secondary | ICD-10-CM | POA: Diagnosis not present

## 2013-02-23 DIAGNOSIS — Z9181 History of falling: Secondary | ICD-10-CM | POA: Diagnosis not present

## 2013-02-25 DIAGNOSIS — M6281 Muscle weakness (generalized): Secondary | ICD-10-CM | POA: Diagnosis not present

## 2013-02-25 DIAGNOSIS — S92009A Unspecified fracture of unspecified calcaneus, initial encounter for closed fracture: Secondary | ICD-10-CM | POA: Diagnosis not present

## 2013-02-25 DIAGNOSIS — Z9181 History of falling: Secondary | ICD-10-CM | POA: Diagnosis not present

## 2013-02-25 DIAGNOSIS — M25569 Pain in unspecified knee: Secondary | ICD-10-CM | POA: Diagnosis not present

## 2013-02-25 DIAGNOSIS — R269 Unspecified abnormalities of gait and mobility: Secondary | ICD-10-CM | POA: Diagnosis not present

## 2013-02-25 DIAGNOSIS — R279 Unspecified lack of coordination: Secondary | ICD-10-CM | POA: Diagnosis not present

## 2013-02-28 ENCOUNTER — Encounter: Payer: Self-pay | Admitting: Internal Medicine

## 2013-02-28 ENCOUNTER — Non-Acute Institutional Stay: Payer: Medicare Other | Admitting: Internal Medicine

## 2013-02-28 VITALS — BP 144/62 | HR 62 | Ht 65.0 in

## 2013-02-28 DIAGNOSIS — F419 Anxiety disorder, unspecified: Secondary | ICD-10-CM

## 2013-02-28 DIAGNOSIS — I6529 Occlusion and stenosis of unspecified carotid artery: Secondary | ICD-10-CM | POA: Diagnosis not present

## 2013-02-28 DIAGNOSIS — S92002S Unspecified fracture of left calcaneus, sequela: Secondary | ICD-10-CM

## 2013-02-28 DIAGNOSIS — I6522 Occlusion and stenosis of left carotid artery: Secondary | ICD-10-CM

## 2013-02-28 DIAGNOSIS — I10 Essential (primary) hypertension: Secondary | ICD-10-CM

## 2013-02-28 DIAGNOSIS — R32 Unspecified urinary incontinence: Secondary | ICD-10-CM

## 2013-02-28 DIAGNOSIS — R269 Unspecified abnormalities of gait and mobility: Secondary | ICD-10-CM

## 2013-02-28 DIAGNOSIS — I739 Peripheral vascular disease, unspecified: Secondary | ICD-10-CM

## 2013-02-28 DIAGNOSIS — S8290XS Unspecified fracture of unspecified lower leg, sequela: Secondary | ICD-10-CM

## 2013-02-28 DIAGNOSIS — R350 Frequency of micturition: Secondary | ICD-10-CM

## 2013-02-28 DIAGNOSIS — K589 Irritable bowel syndrome without diarrhea: Secondary | ICD-10-CM

## 2013-02-28 DIAGNOSIS — R609 Edema, unspecified: Secondary | ICD-10-CM | POA: Insufficient documentation

## 2013-02-28 DIAGNOSIS — M25579 Pain in unspecified ankle and joints of unspecified foot: Secondary | ICD-10-CM

## 2013-02-28 DIAGNOSIS — K219 Gastro-esophageal reflux disease without esophagitis: Secondary | ICD-10-CM

## 2013-02-28 DIAGNOSIS — M25572 Pain in left ankle and joints of left foot: Secondary | ICD-10-CM

## 2013-02-28 DIAGNOSIS — E039 Hypothyroidism, unspecified: Secondary | ICD-10-CM | POA: Diagnosis not present

## 2013-02-28 DIAGNOSIS — F411 Generalized anxiety disorder: Secondary | ICD-10-CM

## 2013-02-28 DIAGNOSIS — S92009A Unspecified fracture of unspecified calcaneus, initial encounter for closed fracture: Secondary | ICD-10-CM | POA: Diagnosis not present

## 2013-02-28 NOTE — Patient Instructions (Addendum)
Discontinue meclizine due to non-use.

## 2013-02-28 NOTE — Progress Notes (Signed)
Subjective:    Patient ID: Tara Walters, female    DOB: [birth year], 79 y.o.   MRN: [id number]  HPI HTN (hypertension):  Controlled  Hypothyroidism: stable  Pain in joint, ankle and foot, left: still in brace;. Sees Dr. Victorino Dike. Had fx of heel.  Urinary incontinence: persistent. Unchanged.  Fracture, calcaneus closed, left, sequela: still in brace  PVD (peripheral vascular disease): absent DP and PT pulses  Spastic colon: constoipation  GERD (gastroesophageal reflux disease): still with some symptoms despite daily use of Nexium  Anxiety: controlled  Gait disorder: unstable  Urinary frequency: chronic  Edema; bilpedal. Unchanged  Carotid stenosis, left: unchanged  Past Medical History  Diagnosis Date  . Personality change due to conditions classified elsewhere   . Carpal tunnel syndrome   . Hypertension   . Peripheral vascular disease   . Carotid artery occlusion   . Unspecified pruritic disorder   . Anxiety   . Abnormality of gait   . Pain in joint, ankle and foot 08/05/2012  . Chronic airway obstruction, not elsewhere classified 08/18/2011  . Open wound of lip, without mention of complication 08/18/2011  . Alopecia, unspecified 07/14/2011  . Sebaceous cyst 07/14/2011  . Allergic rhinitis, cause unspecified 03/06/2011  . Rash and other nonspecific skin eruption 03/06/2011  . Edema 12/16/2010  . Shortness of breath 12/16/2010  . Herpes zoster without mention of complication 10/17/2010  . Conjunctival xerosis 06/24/2010  . Disturbance of salivary secretion 06/24/2010  . Stricture or atresia of vagina 06/24/2010  . Irritable bowel syndrome 03/11/2010  . Abdominal pain, right upper quadrant 02/18/2010  . Nonspecific abnormal finding in stool contents 12/07/2009  . Pain in joint, lower leg 05/02/2009  . Lumbago 05/02/2009  . Urinary tract infection, site not specified 04/12/2009  . Hyposmolality and/or hyponatremia 11/14/2008  . Dizziness and giddiness 11/14/2008  . Internal  hemorrhoids without mention of complication 08/28/2008  . Personal history of fall 05/22/2008  . Syncope and collapse 04/26/2008  . Disturbance of salivary secretion 04/10/2008  . Unspecified hereditary and idiopathic peripheral neuropathy 02/14/2008  . Atherosclerosis of native arteries of the extremities with intermittent claudication 11/25/2007  . Cramp of limb 09/01/2007  . Disturbance of skin sensation 09/01/2007  . Cervicalgia 08/16/2007  . Vitamin D deficiency 02/01/2007  . Abdominal aneurysm without mention of rupture 12/28/2006  . Other abnormal blood chemistry 12/28/2006  . Loss of weight 12/16/2006  . Insomnia, unspecified 08/31/2006  . Unspecified constipation 05/20/2006  . Encounter for long-term (current) use of other medications 05/20/2006  . Unspecified urinary incontinence 01/05/2006  . Urinary frequency 01/05/2006  . Unspecified hypothyroidism 09/22/2005  . Reflux esophagitis 09/08/2005  . Cataract cortical, senile 12/09/2012  . Vaginal atrophy 12/13/2010  . Hair thinning 12/12/2012  . Fracture, calcaneus closed 12/24/2012    Left   Past Surgical History  Procedure Laterality Date  . Carotid endarterectomy  2009  . Dilation and curettage of uterus  1973  . Colonoscopy  2005    mild diverticulosis  . Cataract extraction w/ intraocular lens implant Right 11/2012    Dr. Burgess Estelle      Family Status  Relation Status Death Age  . Mother Deceased     Cause of Death: MI  . Father Deceased     Cause of Death: Leukemia   Family History  Problem Relation Age of Onset  . Heart attack Mother   . Cancer Father     Leukemia   History   Social History  . Marital  Status: Widowed    Spouse Name: N/A    Number of Children: N/A  . Years of Education: N/A   Social History Main Topics  . Smoking status: Former Smoker -- 1.00 packs/day for 50 years    Types: Cigarettes    Quit date: 05/27/1983  . Smokeless tobacco: Never Used  . Alcohol Use: No     Comment: Formerly a minimal user  . Drug  Use: No  . Sexual Activity: No   Other Topics Concern  . None   Social History Narrative   Widowed, Retired Production manager.  Patient lives in  Assisted Living  section at [facility] since 2010,[place] [facility] [place].  Stop smoking 1985, previously smoked one pack a day for about 50 years. Minimal  Alcohol history   Patient has  Advanced planning documents: Living Will, DNR   No brothers or sisters. No children, five stepchildren.                   PROCEDURES   CONSULTANTS    Immunization History  Administered Date(s) Administered  . Influenza-Generic 11/25/2010     Current Outpatient Prescriptions on File Prior to Visit  Medication Sig Dispense Refill  . acetaminophen (TYLENOL) 325 MG tablet Take 325 mg by mouth 3 (three) times daily. Take 2 tablets twice daily for pain relief. Take two tablets three times daily      . alum & mag hydroxide-simeth (MYLANTA) 200-200-20 MG/5ML suspension Take 5 mLs by mouth at bedtime. Give 150 mL qhs       . AMLODIPINE BESYLATE PO Take 5 mg by mouth daily. Take one daily to treat HTN       . Arnica 20 % TINC Apply topically as needed.      . Artificial Tear (GENTEAL) GEL Apply to eye 2 (two) times daily. Apply twice daily to both eyes to help dry eyes       . benzocaine (HURRICAINE) 20 % GEL Use as directed in the mouth or throat. Apply lower lip daily as needed      . clopidogrel (PLAVIX) 75 MG tablet Take 75 mg by mouth daily. Take one daily to reduce stroke risk       . esomeprazole (NEXIUM) 40 MG capsule Take 40 mg by mouth 2 (two) times daily. One twice daily to reduce stomach acid       . eszopiclone (LUNESTA) 2 MG TABS Take 1 tablet (2 mg total) by mouth at bedtime. Take immediately before bedtime      . fluocinonide (LIDEX) 0.05 % external solution Apply topically. Apply to scalp as needed      . hydrocortisone cream 1 % Apply topically. Apply to affected areas twice daily as needed for itch       . loratadine (CLARITIN) 10 MG tablet Take 10 mg by mouth daily.      . meclizine (ANTIVERT) 25 MG tablet Take 25 mg by mouth every 6 (six) hours as needed. Take one tablet every 6 hours prn for nausea and dizziness       . MYRBETRIQ 50 MG TB24 Take 2 tablets by mouth daily. Take 2 +100mg  daily      . senna (SENOKOT) 8.6 MG tablet Take 1 tablet by mouth daily. 2 tablet q hs      . Vitamins-Lipotropics (B-100 PO) Take 1 tablet by mouth daily.       No current facility-administered medications on file prior to visit.  Review of Systems  Constitutional: Negative for fever, diaphoresis, activity change and appetite change.  HENT: Positive for hearing loss. Negative for ear pain.   Eyes: Negative.   Respiratory: Negative.   Cardiovascular: Positive for leg swelling. Negative for chest pain and palpitations.       Left carotid bruit. Followed by Dr. Edilia Bo.  Last [place] of carotids Feb 2014.  Gastrointestinal: Positive for constipation. Negative for abdominal pain, diarrhea and abdominal distention.  Genitourinary: Positive for frequency.       Dribbling. Incontinence. Episodes of vaginal itching in the past.  Musculoskeletal: Positive for back pain and gait problem.       Unstable gait. Wobbly.  Skin:       Thinning hair  Neurological: Positive for weakness.       Peripheral neuropathy. Numb feet. Poor balance. Prior dizziness has improved.  Psychiatric/Behavioral:       Moderate anxiety.       Objective:BP 144/62  Pulse 62  Ht 5\' 5"  (1.651 m)    Physical Exam  Constitutional: She is oriented to person, place, and time. She appears well-developed and well-nourished. No distress.  HENT:  Head: Normocephalic and atraumatic.  Right Ear: External ear normal.  Left Ear: External ear normal.  Nose: Nose normal.  Mouth/Throat: Oropharynx is clear and moist.  Eyes: Conjunctivae and EOM are normal.  Neck: No JVD present. No tracheal deviation present. No thyromegaly present.   Cardiovascular: Normal rate, regular rhythm and normal heart sounds.  Exam reveals no gallop and no friction rub.   No murmur heard. Absent DP and PT pulses. 2/4 left carotid bruit.  Abdominal: She exhibits no distension and no mass. There is no tenderness.  Musculoskeletal: She exhibits edema and tenderness.  Pain in the right foot.  Lymphadenopathy:    She has no cervical adenopathy.  Neurological: She is alert and oriented to person, place, and time. No cranial nerve deficit. Coordination normal.  Tremor with extension of hands.  Skin: No rash noted. No erythema. No pallor.  Psychiatric: She has a normal mood and affect. Her behavior is normal. Thought content normal.    LABS REVIEWED 08/19/12 CBC: Hgb 11.6  CMP: nl  TSH: 2.744      Assessment & Plan:  HTN (hypertension); controlled  Hypothyroidism: stable  Pain in joint, ankle and foot, left: improving  Urinary incontinence: unchanged  Fracture, calcaneus closed, left, sequela: still in brace of the left ankle and foot.  PVD (peripheral vascular disease): unchanged  Spastic colon: improved  GERD (gastroesophageal reflux disease): continues with symptoms. using Mylanta periodically as well as daily Nexium.  Anxiety: controlled  Gait disorder: poor balance and fracture of the left calcaneus.  Urinary frequency: unchanged  Edema: unchanged  Carotid stenosis, left: stable

## 2013-03-01 DIAGNOSIS — M6281 Muscle weakness (generalized): Secondary | ICD-10-CM | POA: Diagnosis not present

## 2013-03-01 DIAGNOSIS — R269 Unspecified abnormalities of gait and mobility: Secondary | ICD-10-CM | POA: Diagnosis not present

## 2013-03-01 DIAGNOSIS — S92009A Unspecified fracture of unspecified calcaneus, initial encounter for closed fracture: Secondary | ICD-10-CM | POA: Diagnosis not present

## 2013-03-01 DIAGNOSIS — Z9181 History of falling: Secondary | ICD-10-CM | POA: Diagnosis not present

## 2013-03-01 DIAGNOSIS — R279 Unspecified lack of coordination: Secondary | ICD-10-CM | POA: Diagnosis not present

## 2013-03-01 DIAGNOSIS — M25569 Pain in unspecified knee: Secondary | ICD-10-CM | POA: Diagnosis not present

## 2013-03-02 DIAGNOSIS — M6281 Muscle weakness (generalized): Secondary | ICD-10-CM | POA: Diagnosis not present

## 2013-03-02 DIAGNOSIS — M25569 Pain in unspecified knee: Secondary | ICD-10-CM | POA: Diagnosis not present

## 2013-03-02 DIAGNOSIS — R279 Unspecified lack of coordination: Secondary | ICD-10-CM | POA: Diagnosis not present

## 2013-03-02 DIAGNOSIS — Z9181 History of falling: Secondary | ICD-10-CM | POA: Diagnosis not present

## 2013-03-02 DIAGNOSIS — S92009A Unspecified fracture of unspecified calcaneus, initial encounter for closed fracture: Secondary | ICD-10-CM | POA: Diagnosis not present

## 2013-03-02 DIAGNOSIS — R269 Unspecified abnormalities of gait and mobility: Secondary | ICD-10-CM | POA: Diagnosis not present

## 2013-03-03 DIAGNOSIS — Z9181 History of falling: Secondary | ICD-10-CM | POA: Diagnosis not present

## 2013-03-03 DIAGNOSIS — M25569 Pain in unspecified knee: Secondary | ICD-10-CM | POA: Diagnosis not present

## 2013-03-03 DIAGNOSIS — R279 Unspecified lack of coordination: Secondary | ICD-10-CM | POA: Diagnosis not present

## 2013-03-03 DIAGNOSIS — R269 Unspecified abnormalities of gait and mobility: Secondary | ICD-10-CM | POA: Diagnosis not present

## 2013-03-03 DIAGNOSIS — S92009A Unspecified fracture of unspecified calcaneus, initial encounter for closed fracture: Secondary | ICD-10-CM | POA: Diagnosis not present

## 2013-03-03 DIAGNOSIS — M6281 Muscle weakness (generalized): Secondary | ICD-10-CM | POA: Diagnosis not present

## 2013-03-04 DIAGNOSIS — R279 Unspecified lack of coordination: Secondary | ICD-10-CM | POA: Diagnosis not present

## 2013-03-04 DIAGNOSIS — M25569 Pain in unspecified knee: Secondary | ICD-10-CM | POA: Diagnosis not present

## 2013-03-04 DIAGNOSIS — Z9181 History of falling: Secondary | ICD-10-CM | POA: Diagnosis not present

## 2013-03-04 DIAGNOSIS — S92009A Unspecified fracture of unspecified calcaneus, initial encounter for closed fracture: Secondary | ICD-10-CM | POA: Diagnosis not present

## 2013-03-04 DIAGNOSIS — R269 Unspecified abnormalities of gait and mobility: Secondary | ICD-10-CM | POA: Diagnosis not present

## 2013-03-04 DIAGNOSIS — M6281 Muscle weakness (generalized): Secondary | ICD-10-CM | POA: Diagnosis not present

## 2013-03-08 DIAGNOSIS — R269 Unspecified abnormalities of gait and mobility: Secondary | ICD-10-CM | POA: Diagnosis not present

## 2013-03-08 DIAGNOSIS — M25569 Pain in unspecified knee: Secondary | ICD-10-CM | POA: Diagnosis not present

## 2013-03-08 DIAGNOSIS — Z9181 History of falling: Secondary | ICD-10-CM | POA: Diagnosis not present

## 2013-03-08 DIAGNOSIS — M6281 Muscle weakness (generalized): Secondary | ICD-10-CM | POA: Diagnosis not present

## 2013-03-08 DIAGNOSIS — S92009A Unspecified fracture of unspecified calcaneus, initial encounter for closed fracture: Secondary | ICD-10-CM | POA: Diagnosis not present

## 2013-03-08 DIAGNOSIS — R279 Unspecified lack of coordination: Secondary | ICD-10-CM | POA: Diagnosis not present

## 2013-03-09 DIAGNOSIS — R269 Unspecified abnormalities of gait and mobility: Secondary | ICD-10-CM | POA: Diagnosis not present

## 2013-03-09 DIAGNOSIS — Z23 Encounter for immunization: Secondary | ICD-10-CM | POA: Diagnosis not present

## 2013-03-09 DIAGNOSIS — M6281 Muscle weakness (generalized): Secondary | ICD-10-CM | POA: Diagnosis not present

## 2013-03-09 DIAGNOSIS — M25569 Pain in unspecified knee: Secondary | ICD-10-CM | POA: Diagnosis not present

## 2013-03-09 DIAGNOSIS — S92009A Unspecified fracture of unspecified calcaneus, initial encounter for closed fracture: Secondary | ICD-10-CM | POA: Diagnosis not present

## 2013-03-09 DIAGNOSIS — Z9181 History of falling: Secondary | ICD-10-CM | POA: Diagnosis not present

## 2013-03-09 DIAGNOSIS — R279 Unspecified lack of coordination: Secondary | ICD-10-CM | POA: Diagnosis not present

## 2013-03-10 DIAGNOSIS — N3941 Urge incontinence: Secondary | ICD-10-CM | POA: Diagnosis not present

## 2013-03-10 DIAGNOSIS — R350 Frequency of micturition: Secondary | ICD-10-CM | POA: Diagnosis not present

## 2013-03-10 DIAGNOSIS — R3915 Urgency of urination: Secondary | ICD-10-CM | POA: Diagnosis not present

## 2013-04-07 DIAGNOSIS — R350 Frequency of micturition: Secondary | ICD-10-CM | POA: Diagnosis not present

## 2013-04-07 DIAGNOSIS — N3941 Urge incontinence: Secondary | ICD-10-CM | POA: Diagnosis not present

## 2013-04-07 DIAGNOSIS — R3915 Urgency of urination: Secondary | ICD-10-CM | POA: Diagnosis not present

## 2013-05-05 DIAGNOSIS — R3915 Urgency of urination: Secondary | ICD-10-CM | POA: Diagnosis not present

## 2013-05-05 DIAGNOSIS — R350 Frequency of micturition: Secondary | ICD-10-CM | POA: Diagnosis not present

## 2013-05-05 DIAGNOSIS — N3941 Urge incontinence: Secondary | ICD-10-CM | POA: Diagnosis not present

## 2013-06-02 DIAGNOSIS — N3941 Urge incontinence: Secondary | ICD-10-CM | POA: Diagnosis not present

## 2013-06-02 DIAGNOSIS — R350 Frequency of micturition: Secondary | ICD-10-CM | POA: Diagnosis not present

## 2013-06-02 DIAGNOSIS — R3915 Urgency of urination: Secondary | ICD-10-CM | POA: Diagnosis not present

## 2013-06-08 ENCOUNTER — Encounter: Payer: Self-pay | Admitting: Geriatric Medicine

## 2013-06-08 ENCOUNTER — Non-Acute Institutional Stay: Payer: Medicare Other | Admitting: Geriatric Medicine

## 2013-06-08 VITALS — BP 120/64 | HR 56 | Wt 160.0 lb

## 2013-06-08 DIAGNOSIS — G5600 Carpal tunnel syndrome, unspecified upper limb: Secondary | ICD-10-CM | POA: Diagnosis not present

## 2013-06-08 DIAGNOSIS — G609 Hereditary and idiopathic neuropathy, unspecified: Secondary | ICD-10-CM | POA: Diagnosis not present

## 2013-06-08 DIAGNOSIS — G629 Polyneuropathy, unspecified: Secondary | ICD-10-CM

## 2013-06-08 DIAGNOSIS — I10 Essential (primary) hypertension: Secondary | ICD-10-CM

## 2013-06-08 NOTE — Progress Notes (Signed)
Patient ID: Tara Walters, female   DOB: June 09, 1921, 80 y.o.   MRN: [id number]  [facility] [phone])  .Code Status:  Living Will, DNR Contact Information   Name Relation Home Work Mobile   Reed,April Daughter [phone]  [phone]   Reed,Tilden Relative [phone]  [phone]   Daisy Lazar Relative [phone]  [phone]      Chief Complaint  Patient presents with  . Medical Managment of Chronic Issues    blood pressure, anxiety, peripheral neuropathy    HPI: This is a 80 y.o. female resident of [facility],  Assisted Living section evaluated today for management of ongoing medical issues.    Last visit: HTN (hypertension) Blood pressures remained well controlled during her this patient's rehabilitation stay, continue current medications. Follow lab at intervals.  Neuropathy, peripheral Abnormal and uncomfortable feelings persist in both patient's feet. No specific interventions, continue to ambulate with walker.  Fracture, calcaneus closed Fractures healing well per report from Dr. Victorino Dike. Patient is ambulating safely, regaining independent status with ADLs. Anticipate patient will return to her assisted-living apartment later this week.  Since last visit patient returned to an assisted-living apartment, no acute medical issues since then. She has resumed daily walking for exercise, no pain from the heel fracture. Reports her feet are feeling "okay, hands are the same". Hands tingle and hurt. Patient continues to follow with urology for PT and as treatment of the urinary frequency. Reports this treatment has decreased the number of voice she has during the day and at night. Review of facility record shows patient's vital signs have been stable, weight is up several pounds.    No Known Allergies  MEDICATION - reviewed  DATA REVIEWED   Radiologic Exams: [facility] mobile x-ray   08/05/2012 right foot x-ray: Mild  hallux valgus deformity of foot. Mild osteoarthritis at the base of the toes. No acute fracture or other injury. No evidence for osteomyelitis   11/29/2012 X-ray of left ankle mild diffuse osteopenia. No acute fracture, malalignment or lytic destructive lesion. Mild soft tissue swelling seen diffusely. No evidence for osteomyelitis  Left foot x-ray, 3 views minimal hallux valgus deformity. Mild osteoarthritis of the distal toes. No subluxation, dislocation or lytic destructive lesion. Old healed deformity at the distal metaphysis of the fifth metatarsal, first distal phalanx, third distal phalanx. No evidence of osteomyelitis   [facility] 12/23/2012 left ankle complete 3+ view: Impression. Nondisplaced fracture of the posterior aspect of the calcaneus. Consider CT to assess further. Osteopenia  Left foot complete 3+ view: Impression fracture of the posterior calcaneus without significant displacement. Nondisplaced fracture of the left fifth metatarsal neck   12/28/2012 CT OF THE LEFT ANKLE WITHOUT CONTRAST: IMPRESSION:Mildly displaced fracture of the calcaneal tuberosity appears unchanged from recent radiographs.  The appearance suggests a subacute insufficiency fracture.  There is no involvement of the subtalar joint.   Cardiovascular Exams:  06/2012 Carotid Doppler / ABI exam results reviewed  08/05/2012 right lower extremity venous Doppler exam: No evidence of DVT   Laboratory Studies: [facility] lab, external  07/15/2011 Vitamin B12 474, Folate 14.0  03/03/2012 CMP: Sodium 137, Potassium 4.5, glucose 67, BUN 21, Creatinine 0.72   CBC Wbc 3.5, Rbc 4.36, Hgb 12.2, Hct 35.9   TSH 2.659  04/08/2012 Urine negative   Urine culture no growth   Vitamin B -12 420  08/19/2012: WBC 4.9, hemoglobin 11.6, hematocrit 34.7, platelets 220   Glucose 79, BUN 23, creatinine 0.84, sodium 138, potassium 4.4. Protein/LFTs WNL  TSH 2.74   Review of Systems  DATA OBTAINED: from patient, nurse,  medical  record GENERAL: Feels well   No fevers, fatigue, change in appetite or weight SKIN: No itch, rash or open wounds EYES: No eye pain, dryness or itching  No change in vision EARS: No earache, tinnitus, change in hearing NOSE: No congestion, drainage or bleeding MOUTH/THROAT: No mouth or tooth pain  No sore throat No difficulty chewing or swallowing RESPIRATORY: No cough, wheezing, SOB CARDIAC: No chest pain, palpitations  No edema. GI: No abdominal pain  No N/V/D or constipation  No heartburn or reflux  GU: No dysuria. Less nighttime frequency  No change in urine volume or character  MUSCULOSKELETAL:   No pain left heel, Gait stable with [place]  No recent fall.  NEUROLOGIC: No dizziness, fainting, headache. Chronic bipedal numbness  No change in mental status.  PSYCHIATRIC: No feelings of anxiety, depression Sleeps well.    Physical Exam Filed Vitals:   06/08/13 1339  BP: 120/64  Pulse: 56  Weight: 160 lb (72.576 kg)   Body mass index is 26.63 kg/(m^2).  GENERAL APPEARANCE: No acute distress, appropriately groomed, normal body habitus. Alert, pleasant, conversant. SKIN: No diaphoresis, rash, unusual lesions, wounds HEAD: Normocephalic, atraumatic EYES: Conjunctiva/lids clear. Pupils round, reactive.  EARS: Decreased Hearing  NOSE: No deformity or discharge. MOUTH/THROAT: Lips w/o lesions. Oral mucosa, tongue moist, w/o lesion. Oropharynx w/o redness or lesions.  NECK: Supple, full ROM. No thyroid tenderness, enlargement or nodule LYMPHATICS: No head, neck or supraclavicular adenopathy RESPIRATORY: Breathing is even, unlabored. Lung sounds are clear and full.  CARDIOVASCULAR: Heart RRR. No murmur or extra heart sounds MUSCULOSKELETAL: Moves all extremities with full ROM, movement is slow and somewhat stiff, normal strength and tone. Back is without kyphosis, scoliosis or spinal process tenderness. Observed ambulating easily and safely in hallway with walker/CAM boot on NEUROLOGIC:  Oriented to time, place, person. Speech clear, no tremor.  PSYCHIATRIC: Mood and affect appropriate to situation   ASSESSMENT/PLAN   HTN (hypertension) Blood pressure reading stable on current medications, update lab  Carpal tunnel syndrome No change in pain and numbness the patient's hands, she reports she is making do the best she can  Neuropathy, peripheral Reports lower legs and feet are bit less painful lately. Patient continues to ambulate daily with a walker. Update B12 level  Vitamin D deficiency Update lab, patient currently is not taking calcium vitamin D supplement    Follow up: 3 months  Lab 07/2013 : CBC, CMP, TSH, B12, Vit D  Yug Loria T.Rafan Sanders, NP-C 06/08/2013

## 2013-06-08 NOTE — Assessment & Plan Note (Signed)
Blood pressure reading stable on current medications, update lab

## 2013-06-08 NOTE — Assessment & Plan Note (Signed)
No change in pain and numbness the patient's hands, she reports she is making do the best she can

## 2013-06-08 NOTE — Assessment & Plan Note (Addendum)
Reports lower legs and feet are bit less painful lately. Patient continues to ambulate daily with a walker. Update B12 level

## 2013-06-08 NOTE — Assessment & Plan Note (Signed)
Update lab, patient currently is not taking calcium vitamin D supplement

## 2013-06-24 ENCOUNTER — Other Ambulatory Visit: Payer: Self-pay | Admitting: Vascular Surgery

## 2013-06-24 DIAGNOSIS — I6529 Occlusion and stenosis of unspecified carotid artery: Secondary | ICD-10-CM

## 2013-06-24 DIAGNOSIS — Z48812 Encounter for surgical aftercare following surgery on the circulatory system: Secondary | ICD-10-CM

## 2013-06-30 DIAGNOSIS — N3941 Urge incontinence: Secondary | ICD-10-CM | POA: Diagnosis not present

## 2013-07-26 DIAGNOSIS — G609 Hereditary and idiopathic neuropathy, unspecified: Secondary | ICD-10-CM | POA: Diagnosis not present

## 2013-07-26 DIAGNOSIS — E559 Vitamin D deficiency, unspecified: Secondary | ICD-10-CM | POA: Diagnosis not present

## 2013-07-26 DIAGNOSIS — I10 Essential (primary) hypertension: Secondary | ICD-10-CM | POA: Diagnosis not present

## 2013-07-26 DIAGNOSIS — Z79899 Other long term (current) drug therapy: Secondary | ICD-10-CM | POA: Diagnosis not present

## 2013-07-26 LAB — BASIC METABOLIC PANEL
BUN: 4 mg/dL (ref 4–21)
Creatinine: 0.8 mg/dL (ref 0.5–1.1)
Glucose: 85 mg/dL
POTASSIUM: 4.3 mmol/L (ref 3.4–5.3)
Sodium: 136 mmol/L — AB (ref 137–147)

## 2013-07-26 LAB — TSH: TSH: 1.51 u[IU]/mL (ref 0.41–5.90)

## 2013-07-26 LAB — CBC AND DIFFERENTIAL
PLATELETS: 222 10*3/uL (ref 150–399)
WBC: 3.6 10*3/mL

## 2013-07-26 LAB — HEPATIC FUNCTION PANEL
ALT: 12 U/L (ref 7–35)
AST: 13 U/L (ref 13–35)
Alkaline Phosphatase: 57 U/L (ref 25–125)
Bilirubin, Total: 0.3 mg/dL

## 2013-08-02 ENCOUNTER — Encounter: Payer: Self-pay | Admitting: Family

## 2013-08-03 ENCOUNTER — Encounter: Payer: Self-pay | Admitting: Family

## 2013-08-03 ENCOUNTER — Ambulatory Visit (HOSPITAL_COMMUNITY)
Admission: RE | Admit: 2013-08-03 | Discharge: 2013-08-03 | Disposition: A | Discharge status: Home / Self Care | Payer: Medicare Other | Source: Ambulatory Visit | Attending: Family | Admitting: Family

## 2013-08-03 ENCOUNTER — Ambulatory Visit (INDEPENDENT_AMBULATORY_CARE_PROVIDER_SITE_OTHER)
Admission: RE | Admit: 2013-08-03 | Discharge: 2013-08-03 | Disposition: A | Discharge status: Home / Self Care | Payer: Medicare Other | Source: Ambulatory Visit | Attending: Family | Admitting: Family

## 2013-08-03 ENCOUNTER — Ambulatory Visit (INDEPENDENT_AMBULATORY_CARE_PROVIDER_SITE_OTHER): Payer: Medicare Other | Admitting: Family

## 2013-08-03 VITALS — BP 160/76 | HR 66 | Resp 16 | Ht 60.0 in | Wt 141.0 lb

## 2013-08-03 DIAGNOSIS — Z48812 Encounter for surgical aftercare following surgery on the circulatory system: Secondary | ICD-10-CM | POA: Diagnosis not present

## 2013-08-03 DIAGNOSIS — I6529 Occlusion and stenosis of unspecified carotid artery: Secondary | ICD-10-CM | POA: Insufficient documentation

## 2013-08-03 DIAGNOSIS — I739 Peripheral vascular disease, unspecified: Secondary | ICD-10-CM

## 2013-08-03 NOTE — Patient Instructions (Addendum)
Stroke Prevention Some medical conditions and behaviors are associated with an increased chance of having a stroke. You may prevent a stroke by making healthy choices and managing medical conditions. HOW CAN I REDUCE MY RISK OF HAVING A STROKE?   Stay physically active. Get at least 30 minutes of activity on most or all days.  Do not smoke. It may also be helpful to avoid exposure to secondhand smoke.  Limit alcohol use. Moderate alcohol use is considered to be:  No more than 2 drinks per day for men.  No more than 1 drink per day for nonpregnant women.  Eat healthy foods. This involves  Eating 5 or more servings of fruits and vegetables a day.  Following a diet that addresses high blood pressure (hypertension), high cholesterol, diabetes, or obesity.  Manage your cholesterol levels.  A diet low in saturated fat, trans fat, and cholesterol and high in fiber may control cholesterol levels.  Take any prescribed medicines to control cholesterol as directed by your health care provider.  Manage your diabetes.  A controlled-carbohydrate, controlled-sugar diet is recommended to manage diabetes.  Take any prescribed medicines to control diabetes as directed by your health care provider.  Control your hypertension.  A low-salt (sodium), low-saturated fat, low-trans fat, and low-cholesterol diet is recommended to manage hypertension.  Take any prescribed medicines to control hypertension as directed by your health care provider.  Maintain a healthy weight.  A reduced-calorie, low-sodium, low-saturated fat, low-trans fat, low-cholesterol diet is recommended to manage weight.  Stop drug abuse.  Avoid taking birth control pills.  Talk to your health care provider about the risks of taking birth control pills if you are over 35 years old, smoke, get migraines, or have ever had a blood clot.  Get evaluated for sleep disorders (sleep apnea).  Talk to your health care provider about  getting a sleep evaluation if you snore a lot or have excessive sleepiness.  Take medicines as directed by your health care provider.  For some people, aspirin or blood thinners (anticoagulants) are helpful in reducing the risk of forming abnormal blood clots that can lead to stroke. If you have the irregular heart rhythm of atrial fibrillation, you should be on a blood thinner unless there is a good reason you cannot take them.  Understand all your medicine instructions.  Make sure that other other conditions (such as anemia or atherosclerosis) are addressed. SEEK IMMEDIATE MEDICAL CARE IF:   You have sudden weakness or numbness of the face, arm, or leg, especially on one side of the body.  Your face or eyelid droops to one side.  You have sudden confusion.  You have trouble speaking (aphasia) or understanding.  You have sudden trouble seeing in one or both eyes.  You have sudden trouble walking.  You have dizziness.  You have a loss of balance or coordination.  You have a sudden, severe headache with no known cause.  You have new chest pain or an irregular heartbeat. Any of these symptoms may represent a serious problem that is an emergency. Do not wait to see if the symptoms will go away. Get medical help at once. Call your local emergency services  (911 in [place] Do not drive yourself to the hospital. Document Released: 06/19/2004 Document Revised: 03/02/2013 Document Reviewed: 11/12/2012 [employer] Patient Information 2014 [employer].   Peripheral Vascular Disease Peripheral Vascular Disease (PVD), also called Peripheral Arterial Disease (PAD), is a circulation problem caused by cholesterol (atherosclerotic plaque) deposits in the   arteries. PVD commonly occurs in the lower extremities (legs) but it can occur in other areas of the body, such as your arms. The cholesterol buildup in the arteries reduces blood flow which can cause pain and other serious problems. The presence  of PVD can place a person at risk for Coronary Artery Disease (CAD).  CAUSES  Causes of PVD can be many. It is usually associated with more than one risk factor such as:   High Cholesterol.  Smoking.  Diabetes.  Lack of exercise or inactivity.  High blood pressure (hypertension).  Obesity.  Family history. SYMPTOMS   When the lower extremities are affected, patients with PVD may experience:  Leg pain with exertion or physical activity. This is called INTERMITTENT CLAUDICATION. This may present as cramping or numbness with physical activity. The location of the pain is associated with the level of blockage. For example, blockage at the abdominal level (distal abdominal aorta) may result in buttock or hip pain. Lower leg arterial blockage may result in calf pain.  As PVD becomes more severe, pain can develop with less physical activity.  In people with severe PVD, leg pain may occur at rest.  Other PVD signs and symptoms:  Leg numbness or weakness.  Coldness in the affected leg or foot, especially when compared to the other leg.  A change in leg color.  Patients with significant PVD are more prone to ulcers or sores on toes, feet or legs. These may take longer to heal or may reoccur. The ulcers or sores can become infected.  If signs and symptoms of PVD are ignored, gangrene may occur. This can result in the loss of toes or loss of an entire limb.  Not all leg pain is related to PVD. Other medical conditions can cause leg pain such as:  Blood clots (embolism) or Deep Vein Thrombosis.  Inflammation of the blood vessels (vasculitis).  Spinal stenosis. DIAGNOSIS  Diagnosis of PVD can involve several different types of tests. These can include:  Pulse Volume Recording Method (PVR). This test is simple, painless and does not involve the use of X-rays. PVR involves measuring and comparing the blood pressure in the arms and legs. An ABI (Ankle-Brachial Index) is calculated.  The normal ratio of blood pressures is 1. As this number becomes smaller, it indicates more severe disease.  < 0.95  indicates significant narrowing in one or more leg vessels.  <0.8 there will usually be pain in the foot, leg or buttock with exercise.  <0.4 will usually have pain in the legs at rest.  <0.25  usually indicates limb threatening PVD.  Doppler detection of pulses in the legs. This test is painless and checks to see if you have a pulses in your legs/feet.  A dye or contrast material (a substance that highlights the blood vessels so they show up on x-ray) may be given to help your caregiver better see the arteries for the following tests. The dye is eliminated from your body by the kidney's. Your caregiver may order blood work to check your kidney function and other laboratory values before the following tests are performed:  Magnetic Resonance Angiography (MRA). An MRA is a picture study of the blood vessels and arteries. The MRA machine uses a large magnet to produce images of the blood vessels.  Computed Tomography Angiography (CTA). A CTA is a specialized x-ray that looks at how the blood flows in your blood vessels. An IV may be inserted into your arm so contrast dye   can be injected.  Angiogram. Is a procedure that uses x-rays to look at your blood vessels. This procedure is minimally invasive, meaning a small incision (cut) is made in your groin. A small tube (catheter) is then inserted into the artery of your groin. The catheter is guided to the blood vessel or artery your caregiver wants to examine. Contrast dye is injected into the catheter. X-rays are then taken of the blood vessel or artery. After the images are obtained, the catheter is taken out. TREATMENT  Treatment of PVD involves many interventions which may include:  Lifestyle changes:  Quitting smoking.  Exercise.  Following a low fat, low cholesterol diet.  Control of diabetes.  Foot care is very  important to the PVD patient. Good foot care can help prevent infection.  Medication:  Cholesterol-lowering medicine.  Blood pressure medicine.  Anti-platelet drugs.  Certain medicines may reduce symptoms of Intermittent Claudication.  Interventional/Surgical options:  Angioplasty. An Angioplasty is a procedure that inflates a balloon in the blocked artery. This opens the blocked artery to improve blood flow.  Stent Implant. A wire mesh tube (stent) is placed in the artery. The stent expands and stays in place, allowing the artery to remain open.  Peripheral Bypass Surgery. This is a surgical procedure that reroutes the blood around a blocked artery to help improve blood flow. This type of procedure may be performed if Angioplasty or stent implants are not an option. SEEK IMMEDIATE MEDICAL CARE IF:   You develop pain or numbness in your arms or legs.  Your arm or leg turns cold, becomes blue in color.  You develop redness, warmth, swelling and pain in your arms or legs. MAKE SURE YOU:   Understand these instructions.  Will watch your condition.  Will get help right away if you are not doing well or get worse. Document Released: 06/19/2004 Document Revised: 08/04/2011 Document Reviewed: 05/16/2008 [facility] Patient Information 2014 [place], [place].   Peripheral Vascular Disease Peripheral Vascular Disease (PVD), also called Peripheral Arterial Disease (PAD), is a circulation problem caused by cholesterol (atherosclerotic plaque) deposits in the arteries. PVD commonly occurs in the lower extremities (legs) but it can occur in other areas of the body, such as your arms. The cholesterol buildup in the arteries reduces blood flow which can cause pain and other serious problems. The presence of PVD can place a person at risk for Coronary Artery Disease (CAD).  CAUSES  Causes of PVD can be many. It is usually associated with more than one risk factor such as:   High  Cholesterol.  Smoking.  Diabetes.  Lack of exercise or inactivity.  High blood pressure (hypertension).  Obesity.  Family history. SYMPTOMS   When the lower extremities are affected, patients with PVD may experience:  Leg pain with exertion or physical activity. This is called INTERMITTENT CLAUDICATION. This may present as cramping or numbness with physical activity. The location of the pain is associated with the level of blockage. For example, blockage at the abdominal level (distal abdominal aorta) may result in buttock or hip pain. Lower leg arterial blockage may result in calf pain.  As PVD becomes more severe, pain can develop with less physical activity.  In people with severe PVD, leg pain may occur at rest.  Other PVD signs and symptoms:  Leg numbness or weakness.  Coldness in the affected leg or foot, especially when compared to the other leg.  A change in leg color.  Patients with significant PVD are more prone  to ulcers or sores on toes, feet or legs. These may take longer to heal or may reoccur. The ulcers or sores can become infected.  If signs and symptoms of PVD are ignored, gangrene may occur. This can result in the loss of toes or loss of an entire limb.  Not all leg pain is related to PVD. Other medical conditions can cause leg pain such as:  Blood clots (embolism) or Deep Vein Thrombosis.  Inflammation of the blood vessels (vasculitis).  Spinal stenosis. DIAGNOSIS  Diagnosis of PVD can involve several different types of tests. These can include:  Pulse Volume Recording Method (PVR). This test is simple, painless and does not involve the use of X-rays. PVR involves measuring and comparing the blood pressure in the arms and legs. An ABI (Ankle-Brachial Index) is calculated. The normal ratio of blood pressures is 1. As this number becomes smaller, it indicates more severe disease.  < 0.95  indicates significant narrowing in one or more leg  vessels.  <0.8 there will usually be pain in the foot, leg or buttock with exercise.  <0.4 will usually have pain in the legs at rest.  <0.25  usually indicates limb threatening PVD.  Doppler detection of pulses in the legs. This test is painless and checks to see if you have a pulses in your legs/feet.  A dye or contrast material (a substance that highlights the blood vessels so they show up on x-ray) may be given to help your caregiver better see the arteries for the following tests. The dye is eliminated from your body by the kidney's. Your caregiver may order blood work to check your kidney function and other laboratory values before the following tests are performed:  Magnetic Resonance Angiography (MRA). An MRA is a picture study of the blood vessels and arteries. The MRA machine uses a large magnet to produce images of the blood vessels.  Computed Tomography Angiography (CTA). A CTA is a specialized x-ray that looks at how the blood flows in your blood vessels. An IV may be inserted into your arm so contrast dye can be injected.  Angiogram. Is a procedure that uses x-rays to look at your blood vessels. This procedure is minimally invasive, meaning a small incision (cut) is made in your groin. A small tube (catheter) is then inserted into the artery of your groin. The catheter is guided to the blood vessel or artery your caregiver wants to examine. Contrast dye is injected into the catheter. X-rays are then taken of the blood vessel or artery. After the images are obtained, the catheter is taken out. TREATMENT  Treatment of PVD involves many interventions which may include:  Lifestyle changes:  Quitting smoking.  Exercise.  Following a low fat, low cholesterol diet.  Control of diabetes.  Foot care is very important to the PVD patient. Good foot care can help prevent infection.  Medication:  Cholesterol-lowering medicine.  Blood pressure medicine.  Anti-platelet  drugs.  Certain medicines may reduce symptoms of Intermittent Claudication.  Interventional/Surgical options:  Angioplasty. An Angioplasty is a procedure that inflates a balloon in the blocked artery. This opens the blocked artery to improve blood flow.  Stent Implant. A wire mesh tube (stent) is placed in the artery. The stent expands and stays in place, allowing the artery to remain open.  Peripheral Bypass Surgery. This is a surgical procedure that reroutes the blood around a blocked artery to help improve blood flow. This type of procedure may be performed if Angioplasty  or stent implants are not an option. SEEK IMMEDIATE MEDICAL CARE IF:   You develop pain or numbness in your arms or legs.  Your arm or leg turns cold, becomes blue in color.  You develop redness, warmth, swelling and pain in your arms or legs. MAKE SURE YOU:   Understand these instructions.  Will watch your condition.  Will get help right away if you are not doing well or get worse. Document Released: 06/19/2004 Document Revised: 08/04/2011 Document Reviewed: 05/16/2008 [facility] Patient Information 2014 [place], [place].

## 2013-08-03 NOTE — Addendum Note (Signed)
Addended by: Adria DillELDRIDGE-LEWIS, Livier Hendel L on: 08/03/2013 03:16 PM   Modules accepted: Orders

## 2013-08-03 NOTE — Progress Notes (Signed)
[facility] [place] HISTORY AND PHYSICAL   MRN : [id number]  History of Present Illness:   Tara Walters is a 80 y.o. female patient of Dr. Edilia Bo who is s/p left CEA in 2009 and also is followed for moderate PAD. She returns for routine surveillance. She is a resident of [facility] facility.    Pt. reports long history of claudication in both calves when she stands and starts walking, resolves with sitting.  Pt. denies rest pain; denies non healing ulcers on legs/feet.  Patient has Negative history of TIA or stroke symptom.  The patient denies amaurosis fugax or monocular blindness.  The patient  denies facial drooping.  Pt. denies hemiplegia.  The patient denies receptive or expressive aphasia.  Pt. denies weakness.  She walks 20 minutes, 7 days/week, using walker.  Pt Diabetic: No Pt smoker: non-smoker  Current Outpatient Prescriptions  Medication Sig Dispense Refill  . acetaminophen (TYLENOL) 325 MG tablet Take 325 mg by mouth 3 (three) times daily. Take 2 tablets twice daily for pain relief. Take two tablets three times daily      . alum & mag hydroxide-simeth (MYLANTA) 200-200-20 MG/5ML suspension Take 5 mLs by mouth at bedtime. Give 150 mL qhs       . AMLODIPINE BESYLATE PO Take 5 mg by mouth daily. Take one daily to treat HTN       . Arnica 20 % TINC Apply topically as needed.      . Artificial Tear (GENTEAL) GEL Apply to eye 2 (two) times daily. Apply twice daily to both eyes to help dry eyes       . B Complex-C (SUPER B COMPLEX PO) Take by mouth. with Vit C supplement: take 2 tablets every morning for hair, skin and nails      . benzocaine (HURRICAINE) 20 % GEL Use as directed in the mouth or throat. Apply lower lip daily as needed      . clopidogrel (PLAVIX) 75 MG tablet Take 75 mg by mouth daily. Take one daily to reduce stroke risk       . esomeprazole (NEXIUM) 40 MG capsule Take 40 mg by mouth 2 (two) times daily. One twice daily to  reduce stomach acid       . eszopiclone (LUNESTA) 2 MG TABS Take 1 tablet (2 mg total) by mouth at bedtime. Take immediately before bedtime      . fluocinonide (LIDEX) 0.05 % external solution Apply topically. Apply to scalp as needed      . hydrocortisone cream 1 % Apply topically. Apply to affected areas twice daily as needed for itch      . loratadine (CLARITIN) 10 MG tablet Take 10 mg by mouth daily.      Marland Kitchen MYRBETRIQ 50 MG TB24 Take 2 tablets by mouth daily. Take 2 +100mg  daily      . senna (SENOKOT) 8.6 MG tablet Take 1 tablet by mouth daily. 2 tablet q hs      . Vitamins-Lipotropics (B-100 PO) Take 1 tablet by mouth daily.       No current facility-administered medications for this visit.    Pt meds include: Statin :No ASA: No Other anticoagulants/antiplatelets: Plavix  Past Medical History  Diagnosis Date  . Personality change due to conditions classified elsewhere   . Carpal tunnel syndrome   . Hypertension   . Peripheral vascular disease   . Carotid artery occlusion   . Unspecified pruritic disorder   . Anxiety   .  Abnormality of gait   . Pain in joint, ankle and foot 08/05/2012  . Chronic airway obstruction, not elsewhere classified 08/18/2011  . Open wound of lip, without mention of complication 08/18/2011  . Alopecia, unspecified 07/14/2011  . Sebaceous cyst 07/14/2011  . Allergic rhinitis, cause unspecified 03/06/2011  . Rash and other nonspecific skin eruption 03/06/2011  . Edema 12/16/2010  . Shortness of breath 12/16/2010  . Herpes zoster without mention of complication 10/17/2010  . Conjunctival xerosis 06/24/2010  . Disturbance of salivary secretion 06/24/2010  . Stricture or atresia of vagina 06/24/2010  . Irritable bowel syndrome 03/11/2010  . Abdominal pain, right upper quadrant 02/18/2010  . Nonspecific abnormal finding in stool contents 12/07/2009  . Pain in joint, lower leg 05/02/2009  . Lumbago 05/02/2009  . Urinary tract infection, site not specified 04/12/2009   . Hyposmolality and/or hyponatremia 11/14/2008  . Dizziness and giddiness 11/14/2008  . Internal hemorrhoids without mention of complication 08/28/2008  . Personal history of fall 05/22/2008  . Syncope and collapse 04/26/2008  . Disturbance of salivary secretion 04/10/2008  . Unspecified hereditary and idiopathic peripheral neuropathy 02/14/2008  . Atherosclerosis of native arteries of the extremities with intermittent claudication 11/25/2007  . Cramp of limb 09/01/2007  . Disturbance of skin sensation 09/01/2007  . Cervicalgia 08/16/2007  . Vitamin D deficiency 02/01/2007  . Abdominal aneurysm without mention of rupture 12/28/2006  . Other abnormal blood chemistry 12/28/2006  . Loss of weight 12/16/2006  . Insomnia, unspecified 08/31/2006  . Unspecified constipation 05/20/2006  . Encounter for long-term (current) use of other medications 05/20/2006  . Unspecified urinary incontinence 01/05/2006  . Urinary frequency 01/05/2006  . Unspecified hypothyroidism 09/22/2005  . Reflux esophagitis 09/08/2005  . Cataract cortical, senile 12/09/2012  . Vaginal atrophy 12/13/2010  . Hair thinning 12/12/2012  . Fracture, calcaneus closed 12/24/2012    Left    Past Surgical History  Procedure Laterality Date  . Carotid endarterectomy  2009  . Dilation and curettage of uterus  1973  . Colonoscopy  2005    mild diverticulosis  . Cataract extraction w/ intraocular lens implant Right 11/2012    Dr. Burgess Estelleanner     Social History History  Substance Use Topics  . Smoking status: Former Smoker -- 1.00 packs/day for 50 years    Types: Cigarettes    Quit date: 05/27/1983  . Smokeless tobacco: Never Used  . Alcohol Use: No     Comment: Formerly a minimal user    Family History Family History  Problem Relation Age of Onset  . Heart attack Mother   . Cancer Father     Leukemia    No Known Allergies   REVIEW OF SYSTEMS: See HPI for pertinent positives and negatives.  Physical Examination Filed Vitals:   08/03/13  1206 08/03/13 1211  BP: 183/72 160/76  Pulse: 75 66  Resp:  16  Height:  5' (1.524 m)  Weight:  141 lb (63.957 kg)  SpO2:  99%   Body mass index is 27.54 kg/(m^2).  General:  WDWN in NAD Gait: Normal HENT: WNL Eyes: Pupils equal Pulmonary: normal non-labored breathing , without Rales, rhonchi,  wheezing Cardiac: RRR, murmer detected  Abdomen: soft, NT, no masses Skin: no rashes, ulcers noted;  no Gangrene , no cellulitis; no open wounds;   VASCULAR EXAM  Carotid Bruits Left Right   transmitted cardiac murmur Transmitted cardiac murmur  VASCULAR EXAM: Extremities without ischemic changes  without Gangrene; without open wounds.                                                                                                          LE Pulses LEFT RIGHT       FEMORAL   palpable   palpable        POPLITEAL  not palpable   not palpable       POSTERIOR TIBIAL  not palpable   not palpable        DORSALIS PEDIS      ANTERIOR TIBIAL not palpable  not palpable     Musculoskeletal: no muscle wasting or atrophy; no edema. Wearing knee high graduated compression hose. Neurologic: A&O X 3; Appropriate Affect ;  SENSATION: normal; MOTOR FUNCTION: 5/5 Symmetric, CN 2-12 intact Speech is fluent/normal  Non-Invasive Vascular Imaging (08/03/2013):  ABI's :  Right: 0.69, Left: 0.62, all triphasic waveforms Previous (07/21/2012) ABI's: Right: 0.74, Left: 0.58 CEREBROVASCULAR DUPLEX EVALUATION    INDICATION: Follow-up carotid artery disease     PREVIOUS INTERVENTION(S): Left carotid endarterectomy     DUPLEX EXAM:     RIGHT  LEFT  Peak Systolic Velocities (cm/s) End Diastolic Velocities (cm/s) Plaque LOCATION Peak Systolic Velocities (cm/s) End Diastolic Velocities (cm/s) Plaque  116 20  CCA PROXIMAL 105 19   83 17  CCA MID 102 28 HT  73 17  CCA DISTAL 116 21   242 17 HT ECA 105 5   127 37 HT ICA PROXIMAL 86 20   127 31  ICA MID 94 30   103 31   ICA DISTAL 98 31     1.7 ICA / CCA Ratio (PSV) NA  Antegrade  Vertebral Flow Abnormal antegrade   168 Brachial Systolic Pressure (mmHg) 168  Within normal limits  Brachial Artery Waveforms Within normal limits     Plaque Morphology:  HM = Homogeneous, HT = Heterogeneous, CP = Calcific Plaque, SP = Smooth Plaque, IP = Irregular Plaque     ADDITIONAL FINDINGS:     IMPRESSION: 1. Evidence of < 40% stenosis of the right internal carotid artery. 2. Widely patent left carotid endarterectomy without evidence of restenosis or hyperplasia.  3. Right external carotid artery stenosis. 4. Bilateral vertebral artery is antegrade.    Compared to the previous exam:  No significant change compared to prior exam.       ASSESSMENT:  BONI MACLELLAN is a 80 y.o. female who is s/p left CEA in 2009 and also is followed for moderate PAD. Evidence of < 40% stenosis of the right internal carotid artery. Widely patent left carotid endarterectomy without evidence of restenosis or hyperplasia.  Right external carotid artery stenosis. She has mild claudication symptoms with no ulcerations. ABI's indicate moderate arterial occlusive disease in both legs, no significant change from a year ago.  PLAN:   Based on today's exam and non-invasive vascular lab results, the patient will follow up in 1 year with the following tests carotid Duplex and ABI's. I discussed  in depth with the patient the nature of atherosclerosis, and emphasized the importance of maximal medical management including strict control of blood pressure, blood glucose, and lipid levels, obtaining regular exercise, and continued cessation of smoking.  The patient is aware that without maximal medical management the underlying atherosclerotic disease process will progress, limiting the benefit of any interventions.  The patient was given information about stroke prevention and what symptoms should prompt the patient to seek immediate medical  care.  The patient was given information about PAD including signs, symptoms, treatment, what symptoms should prompt the patient to seek immediate medical care, and risk reduction measures to take. Thank you for allowing [place] to participate in this patient's care.  Charisse March, RN, MSN, FNP-C [facility] Office: [phone]  Clinic MD: Edilia Bo 08/03/2013 12:30 PM

## 2013-08-22 DIAGNOSIS — N3944 Nocturnal enuresis: Secondary | ICD-10-CM | POA: Diagnosis not present

## 2013-08-22 DIAGNOSIS — N3946 Mixed incontinence: Secondary | ICD-10-CM | POA: Diagnosis not present

## 2013-08-22 DIAGNOSIS — R350 Frequency of micturition: Secondary | ICD-10-CM | POA: Diagnosis not present

## 2013-08-24 ENCOUNTER — Other Ambulatory Visit: Payer: Self-pay | Admitting: Urology

## 2013-08-29 ENCOUNTER — Encounter: Payer: Self-pay | Admitting: Internal Medicine

## 2013-08-29 ENCOUNTER — Encounter (HOSPITAL_BASED_OUTPATIENT_CLINIC_OR_DEPARTMENT_OTHER): Payer: Self-pay | Admitting: *Deleted

## 2013-08-29 ENCOUNTER — Non-Acute Institutional Stay: Payer: Medicare Other | Admitting: Internal Medicine

## 2013-08-29 VITALS — BP 118/70 | HR 61 | Temp 98.0°F | Ht 60.0 in | Wt 160.6 lb

## 2013-08-29 DIAGNOSIS — I739 Peripheral vascular disease, unspecified: Secondary | ICD-10-CM

## 2013-08-29 DIAGNOSIS — E039 Hypothyroidism, unspecified: Secondary | ICD-10-CM | POA: Diagnosis not present

## 2013-08-29 DIAGNOSIS — F411 Generalized anxiety disorder: Secondary | ICD-10-CM | POA: Diagnosis not present

## 2013-08-29 DIAGNOSIS — I6529 Occlusion and stenosis of unspecified carotid artery: Secondary | ICD-10-CM | POA: Diagnosis not present

## 2013-08-29 DIAGNOSIS — N3946 Mixed incontinence: Secondary | ICD-10-CM | POA: Diagnosis not present

## 2013-08-29 DIAGNOSIS — I10 Essential (primary) hypertension: Secondary | ICD-10-CM

## 2013-08-29 DIAGNOSIS — Z79899 Other long term (current) drug therapy: Secondary | ICD-10-CM | POA: Diagnosis not present

## 2013-08-29 DIAGNOSIS — M25579 Pain in unspecified ankle and joints of unspecified foot: Secondary | ICD-10-CM

## 2013-08-29 DIAGNOSIS — R32 Unspecified urinary incontinence: Secondary | ICD-10-CM

## 2013-08-29 NOTE — Progress Notes (Signed)
Patient ID: Tara BeckJanet F Bordley, female   DOB: 08/13/1921, 80 y.o.   MRN: [id number]    Location:  [facility]  Place of Service: CLINIC    No Known Allergies  Chief Complaint  Patient presents with  . Medical Managment of Chronic Issues    6 Month follow up for BP, Thyroid and anxiety    HPI:  HTN (hypertension): controlled  Hypothyroidism: compensated  Carotid stenosis: stable. Saw Dr. Edilia Boickson 08/03/13. <40% blockage left carotid  PVD (peripheral vascular disease): stable. Had claudication with walking.  Pain in joint, ankle and foot: improved. No longer wearing brace  Urinary incontinence: continues to see Dr. Sherron MondayMacDiarmid. Did not respond to PTNS. Now scheduled for Botox.    Medications: Patient's Medications  New Prescriptions   No medications on file  Previous Medications   ACETAMINOPHEN (TYLENOL) 325 MG TABLET    Take 325 mg by mouth 3 (three) times daily. Take 2 tablets twice daily for pain relief. Take two tablets three times daily   ALUM & MAG HYDROXIDE-SIMETH (MYLANTA) 200-200-20 MG/5ML SUSPENSION    Take 5 mLs by mouth at bedtime. Give 150 mL qhs    AMLODIPINE BESYLATE PO    Take 5 mg by mouth daily. Take one daily to treat HTN    ARNICA 20 % TINC    Apply topically as needed.   ARTIFICIAL TEAR (GENTEAL) GEL    Apply to eye 2 (two) times daily. Apply twice daily to both eyes to help dry eyes    B COMPLEX-C (SUPER B COMPLEX PO)    Take by mouth. with Vit C supplement: take 2 tablets every morning for hair, skin and nails   BENZOCAINE (HURRICAINE) 20 % GEL    Use as directed in the mouth or throat. Apply lower lip daily as needed   CLOPIDOGREL (PLAVIX) 75 MG TABLET    Take 75 mg by mouth daily. Take one daily to reduce stroke risk    ESOMEPRAZOLE (NEXIUM) 40 MG CAPSULE    Take 40 mg by mouth 2 (two) times daily. One twice daily to reduce stomach acid    ESZOPICLONE (LUNESTA) 2 MG TABS    Take 1 tablet (2 mg total) by mouth at bedtime. Take immediately before bedtime   FLUOCINONIDE (LIDEX) 0.05 % EXTERNAL SOLUTION    Apply topically. Apply to scalp as needed   HYDROCORTISONE CREAM 1 %    Apply topically. Apply to affected areas twice daily as needed for itch   LORATADINE (CLARITIN) 10 MG TABLET    Take 10 mg by mouth daily.   MYRBETRIQ 50 MG TB24    Take 2 tablets by mouth daily. Take 2 +100mg  daily   SENNA (SENOKOT) 8.6 MG TABLET    Take 1 tablet by mouth daily. 2 tablet q hs   VITAMINS-LIPOTROPICS (B-100 PO)    Take 1 tablet by mouth daily.  Modified Medications   No medications on file  Discontinued Medications   No medications on file     Review of Systems  Constitutional: Negative for fever, diaphoresis, activity change and appetite change.  HENT: Positive for hearing loss. Negative for ear pain.   Eyes: Negative.   Respiratory: Negative.   Cardiovascular: Positive for leg swelling. Negative for chest pain and palpitations.       Left carotid bruit. Followed by Dr. Edilia Boickson.  Last US of carotids March 2015.  Gastrointestinal: Positive for constipation. Negative for abdominal pain, diarrhea and abdominal distention.  Genitourinary: Positive for frequency.  Dribbling. Incontinence. Episodes of vaginal itching in the past.  Musculoskeletal: Positive for back pain and gait problem.       Unstable gait. Wobbly.  Skin:       Thinning hair  Neurological: Positive for weakness.       Peripheral neuropathy. Numb feet. Poor balance. Prior dizziness has improved.  Psychiatric/Behavioral:       Moderate anxiety.    Filed Vitals:   08/29/13 1400  BP: 118/70  Pulse: 61  Temp: 98 F (36.7 C)  TempSrc: Oral  Height: 5' (1.524 m)  Weight: 160 lb 9.6 oz (72.848 kg)   Physical Exam  Constitutional: She is oriented to person, place, and time. She appears well-developed and well-nourished. No distress.  HENT:  Head: Normocephalic and atraumatic.  Right Ear: External ear normal.  Left Ear: External ear normal.  Nose: Nose normal.    Mouth/Throat: Oropharynx is clear and moist.  Eyes: Conjunctivae and EOM are normal.  Neck: No JVD present. No tracheal deviation present. No thyromegaly present.  Cardiovascular: Normal rate, regular rhythm and normal heart sounds.  Exam reveals no gallop and no friction rub.   No murmur heard. Absent DP and PT pulses. 2/4 left carotid bruit.  Abdominal: She exhibits no distension and no mass. There is no tenderness.  Musculoskeletal: She exhibits edema and tenderness.  Pain in the right foot.  Lymphadenopathy:    She has no cervical adenopathy.  Neurological: She is alert and oriented to person, place, and time. No cranial nerve deficit. Coordination normal.  Tremor with extension of hands.  Skin: No rash noted. No erythema. No pallor.  Psychiatric: She has a normal mood and affect. Her behavior is normal. Thought content normal.     Labs reviewed: Nursing Home on 08/29/2013  Component Date Value Ref Range Status  . Platelets 07/26/2013 222  150 - 399 K/L Final  . WBC 07/26/2013 3.6   Final  . Glucose 07/26/2013 85   Final  . BUN 07/26/2013 4  4 - 21 mg/dL Final  . Creatinine 16/02/9603 0.8  0.5 - 1.1 mg/dL Final  . Potassium 54/01/8118 4.3  3.4 - 5.3 mmol/L Final  . Sodium 07/26/2013 136* 137 - 147 mmol/L Final  . Alkaline Phosphatase 07/26/2013 57  25 - 125 U/L Final  . ALT 07/26/2013 12  7 - 35 U/L Final  . AST 07/26/2013 13  13 - 35 U/L Final  . Bilirubin, Total 07/26/2013 0.3   Final  . TSH 07/26/2013 1.51  0.41 - 5.90 uIU/mL Final   B12 normal Vit D normal    Assessment/Plan  1. HTN (hypertension) controlled  2. Hypothyroidism compensated  3. Carotid stenosis stabvle  4. PVD (peripheral vascular disease) unchanged  5. Pain in joint, ankle and foot improved  6. Urinary incontinence Scheduled for Botox by urologist, Dr. Sherron Monday

## 2013-08-30 ENCOUNTER — Encounter (HOSPITAL_BASED_OUTPATIENT_CLINIC_OR_DEPARTMENT_OTHER): Payer: Self-pay | Admitting: *Deleted

## 2013-08-30 DIAGNOSIS — M2040 Other hammer toe(s) (acquired), unspecified foot: Secondary | ICD-10-CM | POA: Diagnosis not present

## 2013-08-30 DIAGNOSIS — L851 Acquired keratosis [keratoderma] palmaris et plantaris: Secondary | ICD-10-CM | POA: Diagnosis not present

## 2013-08-30 DIAGNOSIS — B351 Tinea unguium: Secondary | ICD-10-CM | POA: Diagnosis not present

## 2013-08-31 ENCOUNTER — Encounter (HOSPITAL_BASED_OUTPATIENT_CLINIC_OR_DEPARTMENT_OTHER): Payer: Self-pay | Admitting: *Deleted

## 2013-08-31 NOTE — Progress Notes (Signed)
NPO AFTER MN. ARRIVE AT 0930.  NEEDS ISTAT AND EKG.  WILL TAKE NORVASC AND NEXIUM AM DOS W/ SIPS OF WATER. [facility] PT GREAT HISTORIAN.  USES WALKER. PT STATE THAT CAREGIVER/ DRIVER FROM [facility] WILL BE HERE WITH HER DOS , HER NAME IS NANCY.

## 2013-09-05 NOTE — H&P (Signed)
History of Present Illness   [name] is in one of the arms of the Botox study and does not want a Botox for her refractory overactive bladder. She has failed Myrbetriq and other medications. She is on PTNS as a monotherapy and her last treatment was June 30, 2013.   She had a positive culture in February 2014. Review of Systems: No change in bowel or neurologic systems.   [name] still has high-volume urge incontinence. She has ongoing frequency. She now wants a Botox treatment.   We talked about Botox in detail again.   Pros, cons, success and failure rates of BOTOX were discussed. We talked about off-label usage, durability, and retreatment rates. Risks were described but not limited to the risk of persistent, de novo, or worsening incontinence. We talked about the risk of retention requiring catheterization. We talked about the risk of flow symptoms and high residual urine volumes. Risks of pain, bleeding, infection, and neuropathy were discussed. Rare risks of nerve paralysis and death were discussed. The patient understands that she might not reach her treatment goal and that she might be worse following surgery.  She says PTNS is really not working that well, so she stopped it.    Past Medical History Problems  1. History of Arthritis (V13.4) 2. History of heartburn (V12.79)  Surgical History Problems  1. History of Dilation And Curettage 2. History of Tonsillectomy  Current Meds 1. Armour Thyroid 60 MG Oral Tablet;  Therapy: (Recorded:16Apr2008) to Recorded 2. Ciprofloxacin HCl - 250 MG Oral Tablet; TAKE 2 TABLET Daily;  Therapy: 04Feb2014 to (Evaluate:11Feb2014)  Requested for: 04Feb2014; Last  Rx:04Feb2014 Ordered 3. Citrucel TABS;  Therapy: (Recorded:16Apr2008) to Recorded 4. Colace CAPS;  Therapy: (Recorded:16Apr2008) to Recorded 5. Detrol LA 4 MG Oral Capsule Extended Release 24 Hour;  Therapy: (Recorded:16Apr2008) to Recorded 6. Metoclopramide HCl - 10 MG  Oral Tablet;  Therapy: (Recorded:16Apr2008) to Recorded 7. MiraLax Oral Powder;  Therapy: (Recorded:16Apr2008) to Recorded 8. Myrbetriq 25 MG Oral Tablet Extended Release 24 Hour; TAKE 1 TABLET Once;  Therapy: 12Dec2012 to (Evaluate:11Jan2013); Last Rx:12Dec2012 Ordered 9. NexIUM 40 MG Oral Capsule Delayed Release;  Therapy: (Recorded:16Apr2008) to Recorded 10. Oxytrol 3.9 MG/24HR Transdermal Patch Twice Weekly; a patch 2x per week as directed;   Therapy: 16Apr2008 to (Last Rx:16Apr2008) Ordered 11. Pyridium TABS;   Therapy: (Recorded:16Apr2008) to Recorded 12. Sanctura XR 60 MG CP24; TAKE 1 CAPSULE DAILY;   Therapy: 12May2008 to (Evaluate:08Dec2008); Last Rx:12May2008 Ordered 13. Tums CHEW;   Therapy: (Recorded:16Apr2008) to Recorded  Allergies Medication  1. No Known Drug Allergies  Social History Problems  1. Denied: Alcohol Use 2. Denied: Caffeine Use 3. Marital History - Widowed 4. Occupation:   retired 5. History of Tobacco Use (V15.82)   50 years  Vitals Vital Signs [Data Includes: Last 1 Day]  Recorded: 30Mar2015 01:49PM  Height: 5 ft 5 in Weight: 125 lb  BMI Calculated: 20.8 BSA Calculated: 1.62 Blood Pressure: 182 / 75, Sitting Temperature: 97.6 F, Oral Heart Rate: 67 Respiration: 20  Assessment Assessed  1. Urge and stress incontinence (788.33) 2. Enuresis, nocturnal only (788.36) 3. Increased urinary frequency (788.41)  Plan  Increased urinary frequency, Urge and stress incontinence  1. Follow-up Office  Follow-up  Status: Complete  Done: 30Mar2015  Discussion/Summary   [name] would like to proceed with Botox. I think it is a good option for her even though one could argue that she may at slight increased risk of retention because of age, though data  does not necessarily support this. She will get a urine culture prior to the procedure as per protocol.  She wants this done under anesthesia.  After a thorough review of the management  options for the patient's condition the patient  elected to proceed with surgical therapy as noted above. We have discussed the potential benefits and risks of the procedure, side effects of the proposed treatment, the likelihood of the patient achieving the goals of the procedure, and any potential problems that might occur during the procedure or recuperation. Informed consent has been obtained.

## 2013-09-06 ENCOUNTER — Other Ambulatory Visit: Payer: Self-pay

## 2013-09-06 ENCOUNTER — Encounter (HOSPITAL_BASED_OUTPATIENT_CLINIC_OR_DEPARTMENT_OTHER): Payer: Medicare Other | Admitting: Anesthesiology

## 2013-09-06 ENCOUNTER — Ambulatory Visit (HOSPITAL_BASED_OUTPATIENT_CLINIC_OR_DEPARTMENT_OTHER)
Admission: RE | Admit: 2013-09-06 | Discharge: 2013-09-06 | Disposition: A | Discharge status: Home / Self Care | Payer: Medicare Other | Source: Ambulatory Visit | Attending: Urology | Admitting: Urology

## 2013-09-06 ENCOUNTER — Encounter (HOSPITAL_BASED_OUTPATIENT_CLINIC_OR_DEPARTMENT_OTHER): Payer: Self-pay | Admitting: *Deleted

## 2013-09-06 ENCOUNTER — Encounter (HOSPITAL_BASED_OUTPATIENT_CLINIC_OR_DEPARTMENT_OTHER)
Admission: RE | Disposition: A | Discharge status: Home / Self Care | Payer: Self-pay | Source: Ambulatory Visit | Attending: Urology

## 2013-09-06 ENCOUNTER — Ambulatory Visit (HOSPITAL_BASED_OUTPATIENT_CLINIC_OR_DEPARTMENT_OTHER): Payer: Medicare Other | Admitting: Anesthesiology

## 2013-09-06 DIAGNOSIS — Z79899 Other long term (current) drug therapy: Secondary | ICD-10-CM | POA: Diagnosis not present

## 2013-09-06 DIAGNOSIS — N318 Other neuromuscular dysfunction of bladder: Secondary | ICD-10-CM | POA: Diagnosis not present

## 2013-09-06 DIAGNOSIS — N3941 Urge incontinence: Secondary | ICD-10-CM | POA: Diagnosis not present

## 2013-09-06 DIAGNOSIS — I739 Peripheral vascular disease, unspecified: Secondary | ICD-10-CM | POA: Diagnosis not present

## 2013-09-06 DIAGNOSIS — N3944 Nocturnal enuresis: Secondary | ICD-10-CM | POA: Insufficient documentation

## 2013-09-06 DIAGNOSIS — M129 Arthropathy, unspecified: Secondary | ICD-10-CM | POA: Diagnosis not present

## 2013-09-06 DIAGNOSIS — E039 Hypothyroidism, unspecified: Secondary | ICD-10-CM | POA: Diagnosis not present

## 2013-09-06 DIAGNOSIS — R350 Frequency of micturition: Secondary | ICD-10-CM | POA: Insufficient documentation

## 2013-09-06 DIAGNOSIS — J4489 Other specified chronic obstructive pulmonary disease: Secondary | ICD-10-CM | POA: Insufficient documentation

## 2013-09-06 DIAGNOSIS — J449 Chronic obstructive pulmonary disease, unspecified: Secondary | ICD-10-CM | POA: Insufficient documentation

## 2013-09-06 DIAGNOSIS — Z87891 Personal history of nicotine dependence: Secondary | ICD-10-CM | POA: Insufficient documentation

## 2013-09-06 DIAGNOSIS — I10 Essential (primary) hypertension: Secondary | ICD-10-CM | POA: Insufficient documentation

## 2013-09-06 DIAGNOSIS — K219 Gastro-esophageal reflux disease without esophagitis: Secondary | ICD-10-CM | POA: Insufficient documentation

## 2013-09-06 HISTORY — DX: Peripheral vascular disease, unspecified: I73.9

## 2013-09-06 HISTORY — DX: Allergic rhinitis, unspecified: J30.9

## 2013-09-06 HISTORY — DX: Diverticulosis of large intestine without perforation or abscess without bleeding: K57.30

## 2013-09-06 HISTORY — DX: Carpal tunnel syndrome, bilateral upper limbs: G56.03

## 2013-09-06 HISTORY — DX: Personal history of other diseases of the digestive system: Z87.19

## 2013-09-06 HISTORY — PX: CYSTOSCOPY WITH INJECTION: SHX1424

## 2013-09-06 HISTORY — DX: Personal history of other endocrine, nutritional and metabolic disease: Z86.39

## 2013-09-06 HISTORY — DX: Occlusion and stenosis of right carotid artery: I65.21

## 2013-09-06 HISTORY — DX: Unspecified osteoarthritis, unspecified site: M19.90

## 2013-09-06 HISTORY — DX: Other hemorrhoids: K64.8

## 2013-09-06 HISTORY — DX: Chronic obstructive pulmonary disease, unspecified: J44.9

## 2013-09-06 HISTORY — DX: Urge incontinence: N39.41

## 2013-09-06 LAB — POCT I-STAT, CHEM 8
BUN: 23 mg/dL (ref 6–23)
CHLORIDE: 100 meq/L (ref 96–112)
Calcium, Ion: 1.31 mmol/L — ABNORMAL HIGH (ref 1.13–1.30)
Creatinine, Ser: 1 mg/dL (ref 0.50–1.10)
GLUCOSE: 98 mg/dL (ref 70–99)
HCT: 42 % (ref 36.0–46.0)
Hemoglobin: 14.3 g/dL (ref 12.0–15.0)
POTASSIUM: 4.4 meq/L (ref 3.7–5.3)
Sodium: 140 mEq/L (ref 137–147)
TCO2: 26 mmol/L (ref 0–100)

## 2013-09-06 SURGERY — CYSTOSCOPY, WITH INJECTION OF BLADDER NECK OR BLADDER WALL
Anesthesia: General | Site: Urethra

## 2013-09-06 MED ORDER — FENTANYL CITRATE 0.05 MG/ML IJ SOLN
INTRAMUSCULAR | Status: AC
  Filled 2013-09-06: qty 2

## 2013-09-06 MED ORDER — ONDANSETRON HCL 4 MG/2ML IJ SOLN
INTRAMUSCULAR | Status: DC | PRN
  Administered 2013-09-06: 4 mg via INTRAVENOUS

## 2013-09-06 MED ORDER — LIDOCAINE HCL (CARDIAC) 20 MG/ML IV SOLN
INTRAVENOUS | Status: DC | PRN
  Administered 2013-09-06: 50 mg via INTRAVENOUS

## 2013-09-06 MED ORDER — LACTATED RINGERS IV SOLN
INTRAVENOUS | Status: DC
  Administered 2013-09-06: 10:00:00 via INTRAVENOUS
  Filled 2013-09-06: qty 1000

## 2013-09-06 MED ORDER — CIPROFLOXACIN HCL 250 MG PO TABS: 250.0000 mg | ORAL_TABLET | Freq: Two times a day (BID) | ORAL | Status: DC

## 2013-09-06 MED ORDER — STERILE WATER FOR IRRIGATION IR SOLN
Status: DC | PRN
  Administered 2013-09-06: 3000 mL

## 2013-09-06 MED ORDER — FENTANYL CITRATE 0.05 MG/ML IJ SOLN
INTRAMUSCULAR | Status: DC | PRN
  Administered 2013-09-06: 50 ug via INTRAVENOUS

## 2013-09-06 MED ORDER — CIPROFLOXACIN IN D5W 400 MG/200ML IV SOLN
400.0000 mg | INTRAVENOUS | Status: AC
  Administered 2013-09-06: 400 mg via INTRAVENOUS
  Filled 2013-09-06: qty 200

## 2013-09-06 MED ORDER — PROPOFOL 10 MG/ML IV BOLUS
INTRAVENOUS | Status: DC | PRN
  Administered 2013-09-06: 120 mg via INTRAVENOUS

## 2013-09-06 MED ORDER — DEXAMETHASONE SODIUM PHOSPHATE 4 MG/ML IJ SOLN
INTRAMUSCULAR | Status: DC | PRN
  Administered 2013-09-06: 4 mg via INTRAVENOUS

## 2013-09-06 MED ORDER — SODIUM CHLORIDE 0.9 % IJ SOLN
INTRAMUSCULAR | Status: DC | PRN
  Administered 2013-09-06: 10 mL

## 2013-09-06 MED ORDER — ONABOTULINUMTOXINA 100 UNITS IJ SOLR
INTRAMUSCULAR | Status: DC | PRN
  Administered 2013-09-06: 100 [IU] via INTRAMUSCULAR

## 2013-09-06 SURGICAL SUPPLY — 20 items
BAG DRAIN URO-CYSTO SKYTR STRL (DRAIN) ×3 IMPLANT
BAG DRN UROCATH (DRAIN) ×1
CANISTER SUCT LVC 12 LTR MEDI- (MISCELLANEOUS) ×2 IMPLANT
CATH ROBINSON RED A/P 12FR (CATHETERS) IMPLANT
CLOTH BEACON ORANGE TIMEOUT ST (SAFETY) ×3 IMPLANT
DRAPE CAMERA CLOSED 9X96 (DRAPES) ×3 IMPLANT
ELECT REM PT RETURN 9FT ADLT (ELECTROSURGICAL)
ELECTRODE REM PT RTRN 9FT ADLT (ELECTROSURGICAL) IMPLANT
GLOVE BIO SURGEON STRL SZ 6.5 (GLOVE) ×1 IMPLANT
GLOVE BIO SURGEON STRL SZ7.5 (GLOVE) ×3 IMPLANT
GLOVE BIO SURGEONS STRL SZ 6.5 (GLOVE) ×1
GLOVE INDICATOR 6.5 STRL GRN (GLOVE) ×2 IMPLANT
GOWN STRL REUS W/ TWL XL LVL3 (GOWN DISPOSABLE) IMPLANT
GOWN STRL REUS W/TWL XL LVL3 (GOWN DISPOSABLE) ×7 IMPLANT
NDL SAFETY ECLIPSE 18X1.5 (NEEDLE) ×1 IMPLANT
NEEDLE HYPO 18GX1.5 SHARP (NEEDLE)
PACK CYSTOSCOPY (CUSTOM PROCEDURE TRAY) ×3 IMPLANT
SYR 20CC LL (SYRINGE) ×1 IMPLANT
SYR BULB IRRIGATION 50ML (SYRINGE) IMPLANT
WATER STERILE IRR 3000ML UROMA (IV SOLUTION) ×3 IMPLANT

## 2013-09-06 NOTE — Anesthesia Postprocedure Evaluation (Signed)
Anesthesia Post Note  Patient: Emilia BeckJanet F Ammirati  Procedure(s) Performed: Procedure(s) (LRB): CYSTOSCOPY WITH BOTOX  INJECTION (N/A)  Anesthesia type: General  Patient location: PACU  Post pain: Pain level controlled  Post assessment: Post-op Vital signs reviewed  Last Vitals:  Filed Vitals:   09/06/13 1223  BP: 158/75  Pulse:   Temp: 36.1 C  Resp: 18    Post vital signs: Reviewed  Level of consciousness: sedated  Complications: No apparent anesthesia complications

## 2013-09-06 NOTE — Discharge Instructions (Signed)
I have reviewed discharge instructions in detail with the patient. They will follow-up with me or their physician as scheduled. My nurse will also be calling the patients as per protocol.   Restart plavix on Thursday   Post Anesthesia Home Care Instructions  Activity: Get plenty of rest for the remainder of the day. A responsible adult should stay with you for 24 hours following the procedure.  For the next 24 hours, DO NOT: -Drive a car -Advertising copywriterperate machinery -Drink alcoholic beverages -Take any medication unless instructed by your physician -Make any legal decisions or sign important papers.  Meals: Start with liquid foods such as gelatin or soup. Progress to regular foods as tolerated. Avoid greasy, spicy, heavy foods. If nausea and/or vomiting occur, drink only clear liquids until the nausea and/or vomiting subsides. Call your physician if vomiting continues.  Special Instructions/Symptoms: Your throat may feel dry or sore from the anesthesia or the breathing tube placed in your throat during surgery. If this causes discomfort, gargle with warm salt water. The discomfort should disappear within 24 hours.  CYSTOSCOPY HOME CARE INSTRUCTIONS  Activity: Rest for the remainder of the day.  Do not drive or operate equipment today.  You may resume normal activities in one to two days as instructed by your physician.   Meals: Drink plenty of liquids and eat light foods such as gelatin or soup this evening.  You may return to a normal meal plan tomorrow.  Return to Work: You may return to work in one to two days or as instructed by your physician.  Special Instructions / Symptoms: Call your physician if any of these symptoms occur:   -persistent or heavy bleeding  -bleeding which continues after first few urination  -large blood clots that are difficult to pass  -urine stream diminishes or stops completely  -fever equal to or higher than 101 degrees Farenheit.  -cloudy urine with a  strong, foul odor  -severe pain  Females should always wipe from front to back after elimination.  You may feel some burning pain when you urinate.  This should disappear with time.  Applying moist heat to the lower abdomen or a hot tub bath may help relieve the pain. \  Follow-Up / Date of Return Visit to Your Physician:  *** Call for an appointment to arrange follow-up.  Patient Signature:  ________________________________________________________  Nurse's Signature:  ________________________________________________________

## 2013-09-06 NOTE — Transfer of Care (Signed)
Immediate Anesthesia Transfer of Care Note  Patient: Tara BeckJanet F Garrette  Procedure(s) Performed: Procedure(s) (LRB): CYSTOSCOPY WITH BOTOX  INJECTION (N/A)  Patient Location: PACU  Anesthesia Type: General  Level of Consciousness: awake, oriented, sedated and patient cooperative  Airway & Oxygen Therapy: Patient Spontanous Breathing and Patient connected to face mask oxygen  Post-op Assessment: Report given to PACU RN and Post -op Vital signs reviewed and stable  Post vital signs: Reviewed and stable  Complications: No apparent anesthesia complications

## 2013-09-06 NOTE — Op Note (Signed)
Preoperative diagnosis: Refractory urgency incontinence Postoperative diagnosis: Refractory urgency incontinence Surgery: Cystoscopy and injection of botulinum toxin 100 units Surgeon: Dr. Lorin PicketScott Sabria Florido  The patient has the above diagnoses and consented to the procedure. Preoperative antibiotics were given. Preoperative laboratory tests were normal. She had a narrow introitus.  ACMI scope was utilized. Bladder mucosa and trigone were normal. Is grade 1 and a 4 bladder trabeculation. There is no cystitis  With my usual template I injected 10 cc of saline with 100 units of Botox with .5 cc per injection  at 5 and 7:00 and cephalad to the interureteric ridge. The injections went very well. There was no bleeding. Bladder was emptied and the patient was taken to recovery room  Hopefully the procedure will help reach her treatment goal

## 2013-09-06 NOTE — Anesthesia Preprocedure Evaluation (Signed)
Anesthesia Evaluation  Patient identified by MRN, date of birth, ID band Patient awake    Reviewed: Allergy & Precautions, H&P , NPO status , Patient's Chart, lab work & pertinent test results  Airway Mallampati: III TM Distance: >3 FB Neck ROM: Full    Dental  (+) Edentulous Upper, Edentulous Lower, Dental Advisory Given   Pulmonary COPDformer smoker,  breath sounds clear to auscultation  Pulmonary exam normal       Cardiovascular hypertension, Pt. on medications and Pt. on home beta blockers + Peripheral Vascular Disease (Prior CEA 6/09) negative cardio ROS  Rhythm:Regular Rate:Normal     Neuro/Psych Anxiety negative neurological ROS     GI/Hepatic negative GI ROS, Neg liver ROS, GERD-  Medicated,  Endo/Other  Hypothyroidism   Renal/GU negative Renal ROS  negative genitourinary   Musculoskeletal negative musculoskeletal ROS (+)   Abdominal   Peds  Hematology negative hematology ROS (+)   Anesthesia Other Findings   Reproductive/Obstetrics negative OB ROS                           Anesthesia Physical Anesthesia Plan  ASA: III  Anesthesia Plan: General   Post-op Pain Management:    Induction: Intravenous  Airway Management Planned: LMA  Additional Equipment:   Intra-op Plan:   Post-operative Plan: Extubation in OR  Informed Consent: I have reviewed the patients History and Physical, chart, labs and discussed the procedure including the risks, benefits and alternatives for the proposed anesthesia with the patient or authorized representative who has indicated his/her understanding and acceptance.   Dental advisory given  Plan Discussed with: CRNA  Anesthesia Plan Comments:         Anesthesia Quick Evaluation

## 2013-09-06 NOTE — Interval H&P Note (Signed)
History and Physical Interval Note:  09/06/2013 7:26 AM  Tara Walters  has presented today for surgery, with the diagnosis of URGENT CONTENINENCE  The various methods of treatment have been discussed with the patient and family. After consideration of risks, benefits and other options for treatment, the patient has consented to  Procedure(s): CYSTOSCOPY WITH BOTOX  INJECTION (N/A) as a surgical intervention .  The patient's history has been reviewed, patient examined, no change in status, stable for surgery.  I have reviewed the patient's chart and labs.  Questions were answered to the patient's satisfaction.     Allien Melberg A Baltasar Twilley

## 2013-09-06 NOTE — Anesthesia Procedure Notes (Signed)
Procedure Name: LMA Insertion Date/Time: 09/06/2013 10:56 AM Performed by: Renella CunasHAZEL, Axton Cihlar D Pre-anesthesia Checklist: Patient identified, Emergency Drugs available, Suction available and Patient being monitored Patient Re-evaluated:Patient Re-evaluated prior to inductionOxygen Delivery Method: Circle System Utilized Preoxygenation: Pre-oxygenation with 100% oxygen Intubation Type: IV induction Ventilation: Mask ventilation without difficulty LMA: LMA inserted LMA Size: 4.0 Number of attempts: 1 Airway Equipment and Method: bite block Placement Confirmation: positive ETCO2 Tube secured with: Tape Dental Injury: Teeth and Oropharynx as per pre-operative assessment

## 2013-09-07 ENCOUNTER — Encounter (HOSPITAL_BASED_OUTPATIENT_CLINIC_OR_DEPARTMENT_OTHER): Payer: Self-pay | Admitting: Urology

## 2013-09-21 DIAGNOSIS — N3946 Mixed incontinence: Secondary | ICD-10-CM | POA: Diagnosis not present

## 2013-09-21 DIAGNOSIS — R339 Retention of urine, unspecified: Secondary | ICD-10-CM | POA: Diagnosis not present

## 2013-09-22 ENCOUNTER — Encounter: Payer: Self-pay | Admitting: *Deleted

## 2013-10-04 DIAGNOSIS — R339 Retention of urine, unspecified: Secondary | ICD-10-CM | POA: Diagnosis not present

## 2013-10-04 DIAGNOSIS — N3946 Mixed incontinence: Secondary | ICD-10-CM | POA: Diagnosis not present

## 2013-10-17 ENCOUNTER — Encounter: Payer: Self-pay | Admitting: Internal Medicine

## 2013-12-07 ENCOUNTER — Non-Acute Institutional Stay: Payer: Medicare Other | Admitting: Nurse Practitioner

## 2013-12-07 ENCOUNTER — Encounter: Payer: Self-pay | Admitting: Geriatric Medicine

## 2013-12-07 ENCOUNTER — Encounter: Payer: Medicare Other | Admitting: Nurse Practitioner

## 2013-12-07 ENCOUNTER — Encounter: Payer: Self-pay | Admitting: Nurse Practitioner

## 2013-12-07 VITALS — BP 138/74 | HR 56 | Wt 160.0 lb

## 2013-12-07 DIAGNOSIS — N3941 Urge incontinence: Secondary | ICD-10-CM

## 2013-12-07 DIAGNOSIS — G609 Hereditary and idiopathic neuropathy, unspecified: Secondary | ICD-10-CM

## 2013-12-07 DIAGNOSIS — G5603 Carpal tunnel syndrome, bilateral upper limbs: Secondary | ICD-10-CM

## 2013-12-07 DIAGNOSIS — G5600 Carpal tunnel syndrome, unspecified upper limb: Secondary | ICD-10-CM

## 2013-12-07 DIAGNOSIS — G4700 Insomnia, unspecified: Secondary | ICD-10-CM | POA: Diagnosis not present

## 2013-12-07 DIAGNOSIS — I6529 Occlusion and stenosis of unspecified carotid artery: Secondary | ICD-10-CM

## 2013-12-07 DIAGNOSIS — K219 Gastro-esophageal reflux disease without esophagitis: Secondary | ICD-10-CM

## 2013-12-07 DIAGNOSIS — G629 Polyneuropathy, unspecified: Secondary | ICD-10-CM

## 2013-12-07 DIAGNOSIS — K5900 Constipation, unspecified: Secondary | ICD-10-CM

## 2013-12-07 NOTE — Progress Notes (Signed)
Patient ID: Tara Walters, female   DOB: [birth year], 80 y.o.   MRN: [id number]    Nursing Home Location:  [facility]   Place of Service: Clinic (12)  PCP: Kimber Relic, MD  No Known Allergies  Chief Complaint  Patient presents with  . Medical Management of Chronic Issues    blood pressure, peripheral neuropathy  . Urinary Incontinence    09/06/13 Dr. Roderic Scarce did cystoscopy and injection of Botox for refractory urgency incontinence    HPI:  This is a 80 y.o. female resident of [facility],  Assisted Living section evaluated today for management of ongoing medical issues.   Since last visit pt denies any acute medical issues since.  Walking twice daily about 30 mins, denies any pain or discomfort in her legs  Following with Dr Roderic Scarce due to incontinence did botox injections but did not help  Went to ortho due to carpel tunnel but can no offer her anything so she no longer goes back  Does not have to take anything for this routinely, manages the pain Eats well, no unexpected weight loss and good energy level  Review of Systems:  DATA OBTAINED: from patient GENERAL: Feels well   No fevers, fatigue, change in appetite or weight SKIN: No itch, rash or open wounds EYES: No eye pain, dryness or itching  No change in vision EARS: No earache, tinnitus, change in hearing NOSE: No congestion, drainage or bleeding MOUTH/THROAT: No mouth or tooth pain  No sore throat No difficulty chewing or swallowing RESPIRATORY: No cough, wheezing, SOB CARDIAC: No chest pain, palpitations  No edema. GI: No abdominal pain  No N/V/D or constipation  No heartburn or reflux   GU: No dysuria. Has nighttime frequency  No change in urine volume or character  MUSCULOSKELETAL:   Gait stable with walker, No recent fall. Pain in her wrist but does not need medication NEUROLOGIC: No dizziness, fainting, headache. Chronic bipedal numbness  No change in mental status.    PSYCHIATRIC: No feelings of anxiety, depression Sleeps well.     Past Medical History  Diagnosis Date  . Hypertension   . Anxiety   . Abnormality of gait   . Irritable bowel syndrome 03/11/2010  . Urge urinary incontinence   . Allergic rhinitis   . COPD (chronic obstructive pulmonary disease)   . Diverticulosis of colon   . Peripheral vascular disease   . Carotid stenosis, right     asymptomatic  <40%  right  ica  and right eca  . PAD (peripheral artery disease) monitored by dr Rubye Oaks    moderate  . Claudication, intermittent   . Internal hemorrhoids   . History of esophagitis   . History of hypothyroidism   . Arthritis   . Unspecified hereditary and idiopathic peripheral neuropathy   . Carpal tunnel syndrome on both sides    Past Surgical History  Procedure Laterality Date  . Carotid endarterectomy Left 11-18-2007  . Dilation and curettage of uterus  1973  . Colonoscopy  2005    mild diverticulosis  . Cataract extraction w/ intraocular lens implant Right 11/2012  . Cystoscopy with injection N/A 09/06/2013    Procedure: CYSTOSCOPY WITH BOTOX  INJECTION;  Surgeon: Martina Sinner, MD;  Location: [facility] [place] [facility];  Service: Urology;  Laterality: N/A;   Social History:   reports that she quit smoking about 30 years ago. Her smoking use included Cigarettes. She has a 50 pack-year smoking history. She has  never used smokeless tobacco. She reports that she does not drink alcohol or use illicit drugs.  Family History  Problem Relation Age of Onset  . Heart attack Mother   . Cancer Father     Leukemia    Medications: Patient's Medications  New Prescriptions   No medications on file  Previous Medications   ACETAMINOPHEN (TYLENOL) 325 MG TABLET    Take 325 mg by mouth 3 (three) times daily. Take 2 tablets twice daily for pain relief. Take two tablets three times daily   ALUM & MAG HYDROXIDE-SIMETH (MYLANTA) 200-200-20 MG/5ML SUSPENSION    Take 5 mLs by mouth  at bedtime. Give 150 mL qhs    AMLODIPINE (NORVASC) 5 MG TABLET    Take 5 mg by mouth every morning.    ARNICA 20 % TINC    Apply topically as needed.   ARTIFICIAL TEAR (GENTEAL) GEL    Apply to eye 2 (two) times daily. Apply twice daily to both eyes to help dry eyes    B COMPLEX-C (SUPER B COMPLEX PO)    Take by mouth. with Vit C supplement: take 2 tablets every morning for hair, skin and nails   CLOPIDOGREL (PLAVIX) 75 MG TABLET    Take one tablet daily   ESOMEPRAZOLE (NEXIUM) 40 MG CAPSULE    Take 40 mg by mouth 2 (two) times daily. One twice daily to reduce stomach acid    ESZOPICLONE (LUNESTA) 2 MG TABS    Take 1 tablet (2 mg total) by mouth at bedtime. Take immediately before bedtime   FLUOCINONIDE (LIDEX) 0.05 % EXTERNAL SOLUTION    Apply topically. Apply to scalp as needed   HYDROCORTISONE CREAM 1 %    Apply topically. Apply to affected areas twice daily as needed for itch   LORATADINE (CLARITIN) 10 MG TABLET    Take 10 mg by mouth daily.   MYRBETRIQ 50 MG TB24    Take 2 tablets by mouth daily.    POLYETHYLENE GLYCOL POWDER (GLYCOLAX/MIRALAX) POWDER    Take 17gram daily as needed   SENNA (SENOKOT) 8.6 MG TABLET    Take 1 tablet by mouth daily. 2 tablet q hs  Modified Medications   No medications on file  Discontinued Medications   CIPROFLOXACIN (CIPRO) 250 MG TABLET    Take 1 tablet (250 mg total) by mouth 2 (two) times daily.     Physical Exam: GENERAL APPEARANCE: No acute distress, appropriately groomed, normal body habitus. Alert, pleasant, conversant. SKIN: No diaphoresis, rash, unusual lesions, wounds HEAD: Normocephalic, atraumatic EYES: Conjunctiva/lids clear. Pupils round, reactive.   EARS: Decreased Hearing   NOSE: No deformity or discharge. MOUTH/THROAT: Lips w/o lesions. Oral mucosa, tongue moist, w/o lesion. Oropharynx w/o redness or lesions.   NECK: Supple, full ROM. No thyroid tenderness, enlargement or nodule RESPIRATORY: Breathing is even, unlabored. Lung sounds  are clear and full.   CARDIOVASCULAR: Heart RRR. No murmur or extra heart sounds MUSCULOSKELETAL: Moves all extremities with full ROM, normal strength and tone. Back is without kyphosis, scoliosis or spinal process tenderness. Observed ambulating easily and safely in hallway with walker NEUROLOGIC: Oriented to time, place, person. Speech clear, no tremor.   PSYCHIATRIC: Mood and affect appropriate to situation   Filed Vitals:   12/07/13 1319  BP: 138/74  Pulse: 56  Weight: 160 lb (72.576 kg)      Labs reviewed: Basic Metabolic Panel:  Recent Labs  13/24/4001/08/07 09/06/13 1025  NA 136* 140  K 4.3 4.4  CL  --  100  GLUCOSE  --  98  BUN 4 23  CREATININE 0.8 1.00   Liver Function Tests:  Recent Labs  07/26/13  AST 13  ALT 12  ALKPHOS 57   No results found for this basename: LIPASE, AMYLASE,  in the last 8760 hours No results found for this basename: AMMONIA,  in the last 8760 hours CBC:  Recent Labs  07/26/13 09/06/13 1025  WBC 3.6  --   HGB  --  14.3  HCT  --  42.0  PLT 222  --    Cardiac Enzymes: No results found for this basename: CKTOTAL, CKMB, CKMBINDEX, TROPONINI,  in the last 8760 hours BNP: No components found with this basename: POCBNP,  CBG: No results found for this basename: GLUCAP,  in the last 8760 hours TSH:  Recent Labs  07/26/13  TSH 1.51     Assessment/Plan 1. Bilateral carpal tunnel syndrome -without worsening pain  2. Neuropathy, peripheral -has remained stable  3. Insomnia -stable on medications   4. Urge incontinence of urine -conts to follow up with urology however without much improvement, conts on myrbetriq   5. Constipation, unspecified constipation type -controlled with medication  6. Gastroesophageal reflux disease without esophagitis -stable on nexium   7. PVD -conts yearly surveillance with vascular    Labs/tests ordered- follow up vit d, cbc and cmp before physical

## 2014-03-01 DIAGNOSIS — Z23 Encounter for immunization: Secondary | ICD-10-CM | POA: Diagnosis not present

## 2014-03-09 DIAGNOSIS — E559 Vitamin D deficiency, unspecified: Secondary | ICD-10-CM | POA: Diagnosis not present

## 2014-03-09 DIAGNOSIS — I739 Peripheral vascular disease, unspecified: Secondary | ICD-10-CM | POA: Diagnosis not present

## 2014-03-09 DIAGNOSIS — D649 Anemia, unspecified: Secondary | ICD-10-CM | POA: Diagnosis not present

## 2014-03-09 DIAGNOSIS — F609 Personality disorder, unspecified: Secondary | ICD-10-CM | POA: Diagnosis not present

## 2014-03-13 ENCOUNTER — Non-Acute Institutional Stay: Payer: Medicare Other | Admitting: Internal Medicine

## 2014-03-13 ENCOUNTER — Encounter: Payer: Self-pay | Admitting: Internal Medicine

## 2014-03-13 VITALS — BP 132/62 | HR 74 | Temp 97.3°F | Ht 64.0 in | Wt 155.0 lb

## 2014-03-13 DIAGNOSIS — G629 Polyneuropathy, unspecified: Secondary | ICD-10-CM

## 2014-03-13 DIAGNOSIS — K219 Gastro-esophageal reflux disease without esophagitis: Secondary | ICD-10-CM

## 2014-03-13 DIAGNOSIS — G4700 Insomnia, unspecified: Secondary | ICD-10-CM

## 2014-03-13 DIAGNOSIS — I10 Essential (primary) hypertension: Secondary | ICD-10-CM | POA: Diagnosis not present

## 2014-03-13 DIAGNOSIS — I6529 Occlusion and stenosis of unspecified carotid artery: Secondary | ICD-10-CM | POA: Diagnosis not present

## 2014-03-13 DIAGNOSIS — E039 Hypothyroidism, unspecified: Secondary | ICD-10-CM | POA: Diagnosis not present

## 2014-03-13 DIAGNOSIS — N3941 Urge incontinence: Secondary | ICD-10-CM

## 2014-03-13 DIAGNOSIS — R609 Edema, unspecified: Secondary | ICD-10-CM

## 2014-03-13 DIAGNOSIS — R269 Unspecified abnormalities of gait and mobility: Secondary | ICD-10-CM

## 2014-03-13 DIAGNOSIS — I739 Peripheral vascular disease, unspecified: Secondary | ICD-10-CM

## 2014-03-13 DIAGNOSIS — K589 Irritable bowel syndrome without diarrhea: Secondary | ICD-10-CM

## 2014-03-13 DIAGNOSIS — S92002D Unspecified fracture of left calcaneus, subsequent encounter for fracture with routine healing: Secondary | ICD-10-CM

## 2014-03-13 NOTE — Progress Notes (Signed)
Patient ID: Tara BeckJanet F Hellenbrand, female   DOB: 1922/01/25, 80 y.o.   MRN: [id number]    HISTORY AND PHYSICAL  Location:  [facility]  Nursing Home Room Number: 634 Place of Service: Clinic [phone])   Extended Emergency Contact Information Primary Emergency Contact: Lanora ManisReed,April          [place] [id number] [place] Home Phone: [phone] Mobile Phone: [phone] Relation: Daughter Secondary Emergency Contact: Reed,Tilden  [place] Home Phone: [phone] Mobile Phone: [phone] Relation: Relative   Chief Complaint  Patient presents with  . Annual Exam    Comprehensive exam: blood pressure, thyroid, anxiety    HPI:  Gastroesophageal reflux disease without esophagitis: asymptomatic on PPI  Essential hypertension: controlled  Hypothyroidism, unspecified hypothyroidism type: compensated  Neuropathy, peripheral: numb feeling in feet at times  Insomnia: sleeping OK at this time  PVD (peripheral vascular disease): unchanged  Urge incontinence of urine: Has seen Dr. Sherron MondayMacDiarmid and there does not seem to be a solution for this problem.   Spastic colon: controlled  Gait disorder: using 4 wheel walker  Fracture, calcaneus closed, left, with routine healing, subsequent encounter: resolved. No residual pain.  Edema: improved on compression hosiery    Past Medical History  Diagnosis Date  . Hypertension   . Anxiety   . Abnormality of gait   . Irritable bowel syndrome 03/11/2010  . Urge urinary incontinence   . Allergic rhinitis   . COPD (chronic obstructive pulmonary disease)   . Diverticulosis of colon   . Peripheral vascular disease   . Carotid stenosis, right     asymptomatic  <40%  right  ica  and right eca  . PAD (peripheral artery disease) monitored by dr Rubye Oak[place]erson    moderate  . Claudication, intermittent   . Internal hemorrhoids   . History of esophagitis   . History of hypothyroidism   . Arthritis   .  Unspecified hereditary and idiopathic peripheral neuropathy   . Carpal tunnel syndrome on both sides     Past Surgical History  Procedure Laterality Date  . Carotid endarterectomy Left 11-18-2007  . Dilation and curettage of uterus  1973  . Colonoscopy  2005    mild diverticulosis  . Cataract extraction w/ intraocular lens implant Right 11/2012  . Cystoscopy with injection N/A 09/06/2013    Procedure: CYSTOSCOPY WITH BOTOX  INJECTION;  Surgeon: Martina SinnerScott A MacDiarmid, MD;  [facility];  Service: Urology;  Laterality: N/A;    Patient Care Team: Kimber RelicArthur G Mussa Groesbeck, MD as PCP - General (Internal Medicine) [facility] Claudette Royston Sinner Krell, NP as Nurse Practitioner (Geriatric Medicine)  History   Social History  . Marital Status: Widowed    Spouse Name: N/A    Number of Children: N/A  . Years of Education: N/A   Occupational History  . Not on file.   Social History Main Topics  . Smoking status: Former Smoker -- 1.00 packs/day for 50 years    Types: Cigarettes    Quit date: 05/27/1983  . Smokeless tobacco: Never Used  . Alcohol Use: No  . Drug Use: No  . Sexual Activity: No   Other Topics Concern  . Not on file   Social History Narrative   Widowed, Retired Production managerbuyer.  Patient lives in  Assisted Living  section at [facility] since 2010,[place] [facility] [place].  Stop smoking 1985, previously smoked one pack a day for about 50  years. Minimal  Alcohol history   Patient has  Advanced planning documents: Living Will, DNR   No brothers or sisters. No children, five stepchildren.                     reports that she quit smoking about 30 years ago. Her smoking use included Cigarettes. She has a 50 pack-year smoking history. She has never used smokeless tobacco. She reports that she does not drink alcohol or use illicit drugs.  Immunization History  Administered Date(s) Administered  .  Influenza-Unspecified 11/25/2010, 03/09/2013, 03/01/2014  . PPD Test 12/04/2011    No Known Allergies  Medications: Patient's Medications  New Prescriptions   No medications on file  Previous Medications   ACETAMINOPHEN (TYLENOL) 325 MG TABLET    Take 325 mg by mouth 3 (three) times daily. Take 2 tablets twice daily for pain relief. Take two tablets three times daily   ALUM & MAG HYDROXIDE-SIMETH (MYLANTA) 200-200-20 MG/5ML SUSPENSION    Take 5 mLs by mouth at bedtime. Give 150 mL qhs    AMLODIPINE (NORVASC) 5 MG TABLET    Take 5 mg by mouth every morning.    ARNICA 20 % TINC    Apply topically as needed.   ARTIFICIAL TEAR (GENTEAL) GEL    Apply to eye 2 (two) times daily. Apply twice daily to both eyes to help dry eyes    B COMPLEX-C (SUPER B COMPLEX PO)    Take by mouth. with Vit C supplement: take 2 tablets every morning for hair, skin and nails   CLOPIDOGREL (PLAVIX) 75 MG TABLET    Take one tablet daily   ESOMEPRAZOLE (NEXIUM) 40 MG CAPSULE    Take 40 mg by mouth 2 (two) times daily. One twice daily to reduce stomach acid    ESZOPICLONE (LUNESTA) 2 MG TABS    Take 1 tablet (2 mg total) by mouth at bedtime. Take immediately before bedtime   FLUOCINONIDE (LIDEX) 0.05 % EXTERNAL SOLUTION    Apply topically. Apply to scalp as needed   HYDROCORTISONE CREAM 1 %    Apply topically. Apply to affected areas twice daily as needed for itch   LORATADINE (CLARITIN) 10 MG TABLET    Take 10 mg by mouth daily.   MYRBETRIQ 50 MG TB24    Take 2 tablets by mouth daily.    POLYETHYLENE GLYCOL POWDER (GLYCOLAX/MIRALAX) POWDER    Take 17gram daily as needed   SENNA (SENOKOT) 8.6 MG TABLET    Take 1 tablet by mouth daily. 2 tablet q hs  Modified Medications   No medications on file  Discontinued Medications   No medications on file     Review of Systems  Constitutional: Negative for fever, diaphoresis, activity change and appetite change.  HENT: Positive for hearing loss. Negative for ear pain.     Eyes: Negative.   Respiratory: Negative.   Cardiovascular: Positive for leg swelling. Negative for chest pain and palpitations.       Left carotid bruit. Followed by Dr. Edilia Bo.  Last [place] of carotids March 2015.  Gastrointestinal: Positive for constipation. Negative for abdominal pain, diarrhea and abdominal distention.  Genitourinary: Positive for frequency.       Dribbling. Incontinence. Episodes of vaginal itching in the past.  Musculoskeletal: Positive for back pain and gait problem.       Unstable gait. Wobbly.  Skin:       Thinning hair  Neurological: Positive for weakness.  Peripheral neuropathy. Numb feet. Poor balance. Prior dizziness has improved.  Psychiatric/Behavioral:       Moderate anxiety.    Filed Vitals:   03/13/14 1515  BP: 132/62  Pulse: 74  Temp: 97.3 F (36.3 C)  TempSrc: Oral  Height: 5\' 4"  (1.626 m)  Weight: 155 lb (70.308 kg)  SpO2: 93%   Body mass index is 26.59 kg/(m^2).  Physical Exam  Constitutional: She is oriented to person, place, and time. She appears well-developed and well-nourished. No distress.  HENT:  Head: Normocephalic and atraumatic.  Right Ear: External ear normal.  Left Ear: External ear normal.  Nose: Nose normal.  Mouth/Throat: Oropharynx is clear and moist.  Eyes: Conjunctivae and EOM are normal.  Neck: No JVD present. No tracheal deviation present. No thyromegaly present.  Cardiovascular: Normal rate, regular rhythm and normal heart sounds.  Exam reveals no gallop and no friction rub.   No murmur heard. Absent DP and PT pulses. 2/4 left carotid bruit.  Abdominal: She exhibits no distension and no mass. There is no tenderness.  Musculoskeletal: She exhibits edema and tenderness.  Pain in the right foot.  Lymphadenopathy:    She has no cervical adenopathy.  Neurological: She is alert and oriented to person, place, and time. No cranial nerve deficit. Coordination normal.  Tremor with extension of hands.  Skin:  No rash noted. No erythema. No pallor.  Psychiatric: She has a normal mood and affect. Her behavior is normal. Thought content normal.     Labs reviewed: No visits with results within 3 Month(s) from this visit. Latest known visit with results is:  Admission on 09/06/2013, Discharged on 09/06/2013  Component Date Value Ref Range Status  . Sodium 09/06/2013 140  137 - 147 mEq/L Final  . Potassium 09/06/2013 4.4  3.7 - 5.3 mEq/L Final  . Chloride 09/06/2013 100  96 - 112 mEq/L Final  . BUN 09/06/2013 23  6 - 23 mg/dL Final  . Creatinine, Ser 09/06/2013 1.00  0.50 - 1.10 mg/dL Final  . Glucose, Bld 96/04/540904/14/2015 98  70 - 99 mg/dL Final  . Calcium, Ion 81/19/147804/14/2015 1.31* 1.13 - 1.30 mmol/L Final  . TCO2 09/06/2013 26  0 - 100 mmol/L Final  . Hemoglobin 09/06/2013 14.3  12.0 - 15.0 g/dL Final  . HCT 29/56/213004/14/2015 42.0  36.0 - 46.0 % Final     Assessment/Plan   1. Gastroesophageal reflux disease without esophagitis Asymptomatic on PPI  2. Essential hypertension controlled  3. Hypothyroidism, unspecified hypothyroidism type compensated  4. Neuropathy, peripheral unchanged  5. Insomnia Sleeping OL at this time  6. PVD (peripheral vascular disease) unchanged  7. Urge incontinence of urine unchanged  8. Spastic colon improved  9. Gait disorder Using 4 wheel walker independently  10. Fracture, calcaneus closed, left, with routine healing, subsequent encounter resolved  11. Edema improved

## 2014-05-03 DIAGNOSIS — R4182 Altered mental status, unspecified: Secondary | ICD-10-CM | POA: Diagnosis not present

## 2014-05-03 DIAGNOSIS — N3942 Incontinence without sensory awareness: Secondary | ICD-10-CM | POA: Diagnosis not present

## 2014-06-06 ENCOUNTER — Encounter: Payer: Self-pay | Admitting: Internal Medicine

## 2014-06-06 ENCOUNTER — Non-Acute Institutional Stay: Payer: Medicare Other | Admitting: Internal Medicine

## 2014-06-06 VITALS — BP 120/68 | HR 68 | Temp 97.7°F | Wt 155.0 lb

## 2014-06-06 DIAGNOSIS — M47812 Spondylosis without myelopathy or radiculopathy, cervical region: Secondary | ICD-10-CM | POA: Diagnosis not present

## 2014-06-06 DIAGNOSIS — M545 Low back pain, unspecified: Secondary | ICD-10-CM

## 2014-06-06 DIAGNOSIS — G5601 Carpal tunnel syndrome, right upper limb: Secondary | ICD-10-CM | POA: Diagnosis not present

## 2014-06-06 DIAGNOSIS — G5602 Carpal tunnel syndrome, left upper limb: Secondary | ICD-10-CM | POA: Diagnosis not present

## 2014-06-06 DIAGNOSIS — G5603 Carpal tunnel syndrome, bilateral upper limbs: Secondary | ICD-10-CM

## 2014-06-06 NOTE — Progress Notes (Signed)
Patient ID: Tara Walters, female   DOB: [birth year], 81 y.o.   MRN: [id number]   Location:  [facility]  Code Status: DNR  No Known Allergies  Chief Complaint  Patient presents with  . Back Pain    lower back. Patient feels it's arthritis and wants something other than Tylenol    HPI: Patient is a 81 y.o. white female seen in the office today for acute visit due to pain unrelieved with tylenol.  Has carpal tunnel and bad arthritis...cannot have surgery for carpal tunnel b/c surgeon says the arthritis is too bad.  Has this is in her back, neck, hands, knees. Takes the tylenol three times a day with no relief.   Keeps joints lubricated walking in the building or outside when weather is cold or bad.   Has never taken any other pain medications.   Notices her pain when she first sits and when she's walking.  It rates about a 6-7.  After she goes a bit, it helps.  Both knees stay very sore.   Hands have some numbness, tingling.    Sees [facility] about her bladder.  Review of Systems:  Review of Systems  Constitutional: Negative for fever and malaise/fatigue.  Respiratory: Negative for shortness of breath.   Cardiovascular: Negative for chest pain.  Gastrointestinal: Negative for constipation.  Genitourinary: Positive for urgency and frequency. Negative for dysuria.  Musculoskeletal: Positive for back pain and neck pain. Negative for falls.       Numbness and tingling in fingers/hands  Neurological: Positive for sensory change. Negative for dizziness, loss of consciousness and weakness.  Psychiatric/Behavioral: Negative for memory loss.     Past Medical History  Diagnosis Date  . Hypertension   . Anxiety   . Abnormality of gait   . Irritable bowel syndrome 03/11/2010  . Urge urinary incontinence   . Allergic rhinitis   . COPD (chronic obstructive pulmonary disease)   . Diverticulosis of colon   . Peripheral vascular disease    . Carotid stenosis, right     asymptomatic  <40%  right  ica  and right eca  . PAD (peripheral artery disease) monitored by dr Rubye Oaksdickerson    moderate  . Claudication, intermittent   . Internal hemorrhoids   . History of esophagitis   . History of hypothyroidism   . Arthritis   . Unspecified hereditary and idiopathic peripheral neuropathy   . Carpal tunnel syndrome on both sides   . Fracture, calcaneus closed     Past Surgical History  Procedure Laterality Date  . Carotid endarterectomy Left 11-18-2007  . Dilation and curettage of uterus  1973  . Colonoscopy  2005    mild diverticulosis  . Cataract extraction w/ intraocular lens implant Right 11/2012  . Cystoscopy with injection N/A 09/06/2013    Procedure: CYSTOSCOPY WITH BOTOX  INJECTION;  Surgeon: Martina SinnerScott A MacDiarmid, MD;  [facility];  Service: Urology;  Laterality: N/A;    Social History:   reports that she quit smoking about 31 years ago. Her smoking use included Cigarettes. She has a 50 pack-year smoking history. She has never used smokeless tobacco. She reports that she does not drink alcohol or use illicit drugs.  Family History  Problem Relation Age of Onset  . Heart attack Mother   . Cancer Father     Leukemia    Medications: Patient's Medications  New Prescriptions   No medications on file  Previous Medications   ACETAMINOPHEN (TYLENOL) 325 MG TABLET    Take 325 mg by mouth. Take 2 tablets three times daily for pain relief. Take two tablets every 6 hours as needed for pain   ALUM & MAG HYDROXIDE-SIMETH (MYLANTA) 200-200-20 MG/5ML SUSPENSION    Take 5 mLs by mouth at bedtime. Give 150 mL qhs    AMLODIPINE (NORVASC) 5 MG TABLET    Take 5 mg by mouth every morning.    ARNICA 20 % TINC    Apply topically as needed.   ARTIFICIAL TEAR (GENTEAL) GEL    Apply to eye 2 (two) times daily. Apply twice daily to both eyes to help dry eyes    B COMPLEX-C (SUPER B COMPLEX PO)    Take by mouth. with Vit  C supplement: take 2 tablets every morning for hair, skin and nails   CLOPIDOGREL (PLAVIX) 75 MG TABLET    Take one tablet daily   ESOMEPRAZOLE (NEXIUM) 40 MG CAPSULE    Take 40 mg by mouth 2 (two) times daily. One twice daily to reduce stomach acid    ESZOPICLONE (LUNESTA) 2 MG TABS    Take 1 tablet (2 mg total) by mouth at bedtime. Take immediately before bedtime   FLUOCINONIDE (LIDEX) 0.05 % EXTERNAL SOLUTION    Apply topically. Apply to scalp as needed   HYDROCORTISONE CREAM 1 %    Apply topically. Apply to affected areas twice daily as needed for itch   LORATADINE (CLARITIN) 10 MG TABLET    Take 10 mg by mouth daily.   MYRBETRIQ 50 MG TB24    Take 2 tablets by mouth daily.    POLYETHYLENE GLYCOL POWDER (GLYCOLAX/MIRALAX) POWDER    Take 17gram daily as needed   SENNA (SENOKOT) 8.6 MG TABLET    Take 1 tablet by mouth daily. 2 tablet q hs  Modified Medications   No medications on file  Discontinued Medications   No medications on file     Physical Exam: Filed Vitals:   06/06/14 1644  BP: 120/68  Pulse: 68  Temp: 97.7 F (36.5 C)  TempSrc: Oral  Weight: 155 lb (70.308 kg)  Physical Exam  Constitutional: She is oriented to person, place, and time. She appears well-developed and well-nourished. No distress.  Cardiovascular: Normal rate, regular rhythm, normal heart sounds and intact distal pulses.   Pulmonary/Chest: Effort normal and breath sounds normal. No respiratory distress.  Abdominal: Soft. Bowel sounds are normal. She exhibits no distension and no mass. There is no tenderness.  Musculoskeletal: Normal range of motion. She exhibits tenderness.  Over thoracic spine down through lumbar spine;  Has reproducible neuropathic pain in bilateral hands with tapping over median nerves; walks with rollator walker; crepitus in bilateral knees  Neurological: She is alert and oriented to person, place, and time.  Skin: Skin is warm and dry.   Labs reviewed: Basic Metabolic  Panel:  Recent Labs  07/26/13 09/06/13 1025  NA 136* 140  K 4.3 4.4  CL  --  100  GLUCOSE  --  98  BUN 4 23  CREATININE 0.8 1.00  TSH 1.51  --    Liver Function Tests:  Recent Labs  07/26/13  AST 13  ALT 12  ALKPHOS 57   No results for input(s): LIPASE, AMYLASE in the last 8760 hours. No results for input(s): AMMONIA in the last 8760 hours. CBC:  Recent Labs  07/26/13 09/06/13 1025  WBC 3.6  --   HGB  --  14.3  HCT  --  42.0  PLT 222  --     Assessment/Plan 1. Degenerative arthritis of cervical spine -suspect this contributes to her hand pain--her orthopedist noted her arthritis was too severe to fix her carpal tunnel  2. Bilateral carpal tunnel syndrome -has reproducible CTS symptoms -will try low dose lyrica for this pain  daily--can increase if needed to help pain  3. Midline low back pain without sciatica -try voltaren gel for her arthritis pains in back and knees   Next appt:  Keep regular visit as scheduled  Kilan Banfill L. Jalicia Roszak, D.O. Geriatrics [facility] [address], [place] [address] Cell Phone (Mon-Fri 8am-5pm):  [phone] On Call:  [phone] & follow prompts after 5pm & weekends Office Phone:  [phone] Office Fax:  [phone]

## 2014-06-07 ENCOUNTER — Other Ambulatory Visit: Payer: Self-pay | Admitting: *Deleted

## 2014-06-07 MED ORDER — PREGABALIN 75 MG PO CAPS: 75.0000 mg | ORAL_CAPSULE | Freq: Every day | ORAL | Status: DC

## 2014-06-07 MED ORDER — DICLOFENAC SODIUM 1 % TD GEL: 4.0000 g | Freq: Four times a day (QID) | TRANSDERMAL | Status: DC

## 2014-06-12 ENCOUNTER — Non-Acute Institutional Stay: Payer: Medicare Other | Admitting: Adult Health

## 2014-06-12 DIAGNOSIS — R269 Unspecified abnormalities of gait and mobility: Secondary | ICD-10-CM

## 2014-06-12 DIAGNOSIS — R531 Weakness: Secondary | ICD-10-CM | POA: Diagnosis not present

## 2014-06-12 DIAGNOSIS — G629 Polyneuropathy, unspecified: Secondary | ICD-10-CM

## 2014-06-12 LAB — HEPATIC FUNCTION PANEL
ALT: 16 U/L (ref 7–35)
AST: 18 U/L (ref 13–35)
Alkaline Phosphatase: 63 U/L (ref 25–125)
BILIRUBIN, TOTAL: 0.2 mg/dL

## 2014-06-12 LAB — CBC AND DIFFERENTIAL
HCT: 37 % (ref 36–46)
Hemoglobin: 12.2 g/dL (ref 12.0–16.0)
PLATELETS: 205 10*3/uL (ref 150–399)
WBC: 4.2 10^3/mL

## 2014-06-12 LAB — BASIC METABOLIC PANEL
BUN: 18 mg/dL (ref 4–21)
Creatinine: 0.8 mg/dL (ref 0.5–1.1)
Glucose: 106 mg/dL
Potassium: 4.5 mmol/L (ref 3.4–5.3)
SODIUM: 137 mmol/L (ref 137–147)

## 2014-06-12 NOTE — Assessment & Plan Note (Signed)
Improved pain with Lyrica but could not tolerate due to the above. Will D/C and may try another agent once she returns to baseline

## 2014-06-12 NOTE — Progress Notes (Signed)
Patient ID: Tara Walters, female   DOB: [birth year], 81 y.o.   MRN: [id number]  Nursing [facility]    Place of Service: ALF [phone](13)  Chief Complaint  Patient presents with  . Acute Visit    shaking, headache, feels foggy    HPI:  81 y.o.  [facility] I was asked to see her today for decreased ability to walk (her normal exercise) for the past few days. She also has reported feeling "foggy headed".  She denies fever or decreased appetite. She was started on Lyrica on 06/06/14 for peripheral neuropathy and her pain has improved but there is concern that this is causing her symptoms.   Review of Systems:  Review of Systems  Constitutional: Positive for activity change. Negative for fever, chills, diaphoresis, appetite change and fatigue.  HENT: Negative for congestion, postnasal drip and trouble swallowing.   Respiratory: Negative for cough, choking and shortness of breath.   Gastrointestinal: Negative for nausea, vomiting, abdominal pain, constipation and abdominal distention.  Genitourinary: Negative for dysuria and difficulty urinating.  Skin: Negative for color change, pallor and wound.  Neurological: Positive for weakness. Negative for dizziness, facial asymmetry, speech difficulty, light-headedness and headaches.  Psychiatric/Behavioral: Positive for confusion. Negative for behavioral problems and agitation.    Medications: Patient's Medications  New Prescriptions   No medications on file  Previous Medications   ACETAMINOPHEN (TYLENOL) 325 MG TABLET    Take 325 mg by mouth. Take 2 tablets three times daily for pain relief. Take two tablets every 6 hours as needed for pain   ALUM & MAG HYDROXIDE-SIMETH (MYLANTA) 200-200-20 MG/5ML SUSPENSION    Take 5 mLs by mouth at bedtime. Give 150 mL qhs    AMLODIPINE (NORVASC) 5 MG TABLET    Take 5 mg by mouth every morning.    ARNICA 20 % TINC    Apply  topically as needed.   ARTIFICIAL TEAR (GENTEAL) GEL    Apply to eye 2 (two) times daily. Apply twice daily to both eyes to help dry eyes    B COMPLEX-C (SUPER B COMPLEX PO)    Take by mouth. with Vit C supplement: take 2 tablets every morning for hair, skin and nails   CLOPIDOGREL (PLAVIX) 75 MG TABLET    Take one tablet daily   DICLOFENAC SODIUM (VOLTAREN) 1 % GEL    Apply 4 g topically 4 (four) times daily. To knees   ESOMEPRAZOLE (NEXIUM) 40 MG CAPSULE    Take 40 mg by mouth 2 (two) times daily. One twice daily to reduce stomach acid    ESZOPICLONE (LUNESTA) 2 MG TABS    Take 1 tablet (2 mg total) by mouth at bedtime. Take immediately before bedtime   FLUOCINONIDE (LIDEX) 0.05 % EXTERNAL SOLUTION    Apply topically. Apply to scalp as needed   HYDROCORTISONE CREAM 1 %    Apply topically. Apply to affected areas twice daily as needed for itch   LORATADINE (CLARITIN) 10 MG TABLET    Take 10 mg by mouth daily.   MYRBETRIQ 50 MG TB24    Take 2 tablets by mouth daily.    POLYETHYLENE GLYCOL POWDER (GLYCOLAX/MIRALAX) POWDER    Take 17gram daily as needed   SENNA (SENOKOT) 8.6 MG TABLET    Take 1 tablet by mouth daily. 2 tablet q hs  Modified Medications   No medications on file  Discontinued Medications   PREGABALIN (LYRICA) 75  MG CAPSULE    Take 1 capsule (75 mg total) by mouth daily.     Physical Exam:  Filed Vitals:   06/12/14 1113  BP: 179/77  Pulse: 67  Temp: 97.6 F (36.4 C)  Resp: 20    Physical Exam  Constitutional: She is oriented to person, place, and time. No distress.  frail  Neck: No JVD present. No tracheal deviation present.  Cardiovascular: Normal rate, regular rhythm and normal heart sounds.   No murmur heard. Pulmonary/Chest: Effort normal and breath sounds normal. No respiratory distress. She has no wheezes. She has no rales.  Abdominal: Soft. Bowel sounds are normal. She exhibits no distension. There is no tenderness. There is no rebound.  Musculoskeletal:  Normal range of motion. She exhibits no edema or tenderness.  Lymphadenopathy:    She has no cervical adenopathy.  Neurological: She is alert and oriented to person, place, and time. No cranial nerve deficit. Coordination normal.  Skin: Skin is warm and dry. She is not diaphoretic. No erythema.  Psychiatric: Affect normal.    Labs reviewed/Significant Diagnostic Results:  Basic Metabolic Panel:  Recent Labs  16/10/96 09/06/13 1025  NA 136* 140  K 4.3 4.4  CL  --  100  GLUCOSE  --  98  BUN 4 23  CREATININE 0.8 1.00   Liver Function Tests:  Recent Labs  07/26/13  AST 13  ALT 12  ALKPHOS 57   No results for input(s): LIPASE, AMYLASE in the last 8760 hours. No results for input(s): AMMONIA in the last 8760 hours. CBC:  Recent Labs  07/26/13 09/06/13 1025  WBC 3.6  --   HGB  --  14.3  HCT  --  42.0  PLT 222  --    CBG: No results for input(s): GLUCAP in the last 8760 hours. TSH:  Recent Labs  07/26/13  TSH 1.51      Assessment/Plan Gait disorder Reports increased weakness and not able to walk for exercise as frequently as she used to. Recently started on Lyrica and also reports increased sleepiness and "foggy headiness".  Will D/C lyrica. Discussed this with Dr. Renato Gails. Monitor for improvement in symptoms. Check CBC, CMP   Neuropathy, peripheral Improved pain with Lyrica but could not tolerate due to the above. Will D/C and may try another agent once she returns to baseline    Peggye Ley, ANP [facility] [phone]

## 2014-06-12 NOTE — Assessment & Plan Note (Signed)
Reports increased weakness and not able to walk for exercise as frequently as she used to. Recently started on Lyrica and also reports increased sleepiness and "foggy headiness".  Will D/C lyrica. Discussed this with Dr. Renato Gailseed. Monitor for improvement in symptoms. Check CBC, CMP

## 2014-06-28 ENCOUNTER — Encounter: Payer: Self-pay | Admitting: Nurse Practitioner

## 2014-06-28 ENCOUNTER — Non-Acute Institutional Stay: Payer: Medicare Other | Admitting: Nurse Practitioner

## 2014-06-28 VITALS — BP 120/72 | HR 78 | Temp 98.1°F | Wt 156.0 lb

## 2014-06-28 DIAGNOSIS — I10 Essential (primary) hypertension: Secondary | ICD-10-CM

## 2014-06-28 NOTE — Progress Notes (Signed)
Patient ID: Tara Walters, female   DOB: 03/25/1922, 81 y.o.   MRN: [id number]    Nursing Home Location:  [facility] [employer] [facility] Service: Clinic (12)  PCP: REED, Elmarie Shiley, DO  No Known Allergies  Chief Complaint  Patient presents with  . Medical Management of Chronic Issues    blood pressure    HPI:  Patient is a 81 y.o. female seen today at [facility] who presents today for a blood pressure check. Her amlodipine was increased to 10 mg on 06/15/14.  Her blood pressures have ranged from 100/69 to 178/98 but has mostly been elevated above 150/90. Patient reports that blood pressures are taken right after breakfast after she has walked back from the dining room.  She denies chest pain, shortness of breath, and leg swelling.  Discussed with unit nurse that medication is given after blood pressure checks.   Review of Systems:  Review of Systems  Constitutional: Negative for fever, chills and fatigue.  Eyes: Negative for visual disturbance.  Respiratory: Negative for cough, shortness of breath and wheezing.   Cardiovascular: Negative for chest pain, palpitations and leg swelling.  Skin: Negative for color change, pallor, rash and wound.  Neurological: Negative for dizziness, weakness, light-headedness and headaches.    Past Medical History  Diagnosis Date  . Hypertension   . Anxiety   . Abnormality of gait   . Irritable bowel syndrome 03/11/2010  . Urge urinary incontinence   . Allergic rhinitis   . COPD (chronic obstructive pulmonary disease)   . Diverticulosis of colon   . Peripheral vascular disease   . Carotid stenosis, right     asymptomatic  <40%  right  ica  and right eca  . PAD (peripheral artery disease) monitored by dr Rubye Oaks    moderate  . Claudication, intermittent   . Internal hemorrhoids   . History of esophagitis   . History of hypothyroidism   . Arthritis   . Unspecified hereditary and idiopathic peripheral  neuropathy   . Carpal tunnel syndrome on both sides   . Fracture, calcaneus closed    Past Surgical History  Procedure Laterality Date  . Carotid endarterectomy Left 11-18-2007  . Dilation and curettage of uterus  1973  . Colonoscopy  2005    mild diverticulosis  . Cataract extraction w/ intraocular lens implant Right 11/2012  . Cystoscopy with injection N/A 09/06/2013    Procedure: CYSTOSCOPY WITH BOTOX  INJECTION;  Surgeon: Martina Sinner, MD;  Location: [facility] [place];  Service: Urology;  Laterality: N/A;   Social History:   reports that she quit smoking about 31 years ago. Her smoking use included Cigarettes. She has a 50 pack-year smoking history. She has never used smokeless tobacco. She reports that she does not drink alcohol or use illicit drugs.  Family History  Problem Relation Age of Onset  . Heart attack Mother   . Cancer Father     Leukemia    Medications: Patient's Medications  New Prescriptions   No medications on file  Previous Medications   ACETAMINOPHEN (TYLENOL) 325 MG TABLET    Take 325 mg by mouth. Take 2 tablets three times daily for pain relief. Take two tablets every 6 hours as needed for pain   ALUM & MAG HYDROXIDE-SIMETH (MYLANTA) 200-200-20 MG/5ML SUSPENSION    Take 5 mLs by mouth at bedtime. Give 150 mL qhs; take twice daily   AMLODIPINE (NORVASC) 10 MG TABLET    Take  one tablet daily for blood pressure   ARNICA 20 % TINC    Apply topically as needed.   ARTIFICIAL TEAR (GENTEAL) GEL    Apply to eye 2 (two) times daily. Apply twice daily to both eyes to help dry eyes    B COMPLEX-C (SUPER B COMPLEX PO)    Take by mouth. with Vit C supplement: take 2 tablets every morning for hair, skin and nails   CLOPIDOGREL (PLAVIX) 75 MG TABLET    Take one tablet daily   DICLOFENAC SODIUM (VOLTAREN) 1 % GEL    Apply 4 g topically 4 (four) times daily. To knees   ESOMEPRAZOLE (NEXIUM) 40 MG CAPSULE    Take 40 mg by mouth 2 (two) times daily. One twice  daily to reduce stomach acid    ESZOPICLONE (LUNESTA) 2 MG TABS    Take 1 tablet (2 mg total) by mouth at bedtime. Take immediately before bedtime   FLUOCINONIDE (LIDEX) 0.05 % EXTERNAL SOLUTION    Apply topically. Apply to scalp as needed   HYDROCORTISONE CREAM 1 %    Apply topically. Apply to affected areas twice daily as needed for itch   LORATADINE (CLARITIN) 10 MG TABLET    Take 10 mg by mouth daily.   MYRBETRIQ 50 MG TB24    Take 2 tablets by mouth daily.    NYSTATIN 1000000 UNITS CAPS    Take by mouth. Apply cream to peri-rectal area after each bowel movement   POLYETHYLENE GLYCOL POWDER (GLYCOLAX/MIRALAX) POWDER    Take 17gram daily as needed   SENNA (SENOKOT) 8.6 MG TABLET    Take 1 tablet by mouth daily. 2 tablet q hs  Modified Medications   No medications on file  Discontinued Medications   AMLODIPINE (NORVASC) 5 MG TABLET    Take 5 mg by mouth every morning.      Physical Exam: Filed Vitals:   06/28/14 0941  BP: 120/72  Pulse: 78  Temp: 98.1 F (36.7 C)  TempSrc: Oral  Weight: 156 lb (70.761 kg)  SpO2: 97%    Physical Exam  Constitutional: She is oriented to person, place, and time. She appears well-developed and well-nourished.  HENT:  Head: Normocephalic and atraumatic.  Neck: Normal range of motion. Neck supple.  Cardiovascular: Normal rate, regular rhythm, normal heart sounds and intact distal pulses.   Pulmonary/Chest: Effort normal and breath sounds normal. She has no wheezes.  Lymphadenopathy:    She has no cervical adenopathy.  Neurological: She is alert and oriented to person, place, and time.  Skin: Skin is warm and dry.  Psychiatric: She has a normal mood and affect.    Labs reviewed: Basic Metabolic Panel:  Recent Labs  16/10/96 09/06/13 1025 06/12/14  NA 136* 140 137  K 4.3 4.4 4.5  CL  --  100  --   GLUCOSE  --  98  --   BUN CREATININE 0.8 1.00 0.8   Liver Function Tests:  Recent Labs  07/26/13 06/12/14  AST 13 18  ALT 12  16  ALKPHOS 57 63   No results for input(s): LIPASE, AMYLASE in the last 8760 hours. No results for input(s): AMMONIA in the last 8760 hours. CBC:  Recent Labs  07/26/13 09/06/13 1025 06/12/14  WBC 3.6  --  4.2  HGB  --  14.3 12.2  HCT  --  42.0 37  PLT 222  --  205   TSH:  Recent Labs  07/26/13  TSH 1.51   A1C: No results found for: HGBA1C Lipid Panel: No results for input(s): CHOL, HDL, LDLCALC, TRIG, CHOLHDL, LDLDIRECT in the last 8760 hours.    Assessment/Plan 1. Hypertension  pt asymptomatic, today blood pressure was at goal however overall blood pressure not controlled. blood pressure being taken before medications given.  Will keep amlodipine at 10 mg.  Advised nursing to take blood pressure one hour after giving medication and after pt has been sitting for a few minutes.  -will follow up bp in 1 week

## 2014-07-05 DIAGNOSIS — H52203 Unspecified astigmatism, bilateral: Secondary | ICD-10-CM | POA: Diagnosis not present

## 2014-07-05 DIAGNOSIS — H35372 Puckering of macula, left eye: Secondary | ICD-10-CM | POA: Diagnosis not present

## 2014-07-05 DIAGNOSIS — H02403 Unspecified ptosis of bilateral eyelids: Secondary | ICD-10-CM | POA: Diagnosis not present

## 2014-07-05 DIAGNOSIS — H2512 Age-related nuclear cataract, left eye: Secondary | ICD-10-CM | POA: Diagnosis not present

## 2014-07-21 ENCOUNTER — Telehealth: Payer: Self-pay

## 2014-07-21 NOTE — Telephone Encounter (Signed)
Fax was received yesterday 07/20/14 from [facility] Prior Authorization Request Form- Myrbetriq 50 mg 2 by mouth at bedtime  Form was completed, stamped and faxed back to [phone]

## 2014-08-08 ENCOUNTER — Encounter: Payer: Self-pay | Admitting: Family

## 2014-08-09 ENCOUNTER — Other Ambulatory Visit: Payer: Self-pay | Admitting: Family

## 2014-08-09 ENCOUNTER — Ambulatory Visit (INDEPENDENT_AMBULATORY_CARE_PROVIDER_SITE_OTHER): Payer: Medicare Other | Admitting: Family

## 2014-08-09 ENCOUNTER — Ambulatory Visit (HOSPITAL_COMMUNITY)
Admission: RE | Admit: 2014-08-09 | Discharge: 2014-08-09 | Disposition: A | Discharge status: Home / Self Care | Payer: Medicare Other | Source: Ambulatory Visit | Attending: Family | Admitting: Family

## 2014-08-09 ENCOUNTER — Other Ambulatory Visit (HOSPITAL_COMMUNITY): Payer: Medicare Other

## 2014-08-09 ENCOUNTER — Ambulatory Visit: Payer: Medicare Other | Admitting: Family

## 2014-08-09 ENCOUNTER — Encounter: Payer: Self-pay | Admitting: Family

## 2014-08-09 ENCOUNTER — Ambulatory Visit (INDEPENDENT_AMBULATORY_CARE_PROVIDER_SITE_OTHER)
Admission: RE | Admit: 2014-08-09 | Discharge: 2014-08-09 | Disposition: A | Discharge status: Home / Self Care | Payer: Medicare Other | Source: Ambulatory Visit | Attending: Family | Admitting: Family

## 2014-08-09 ENCOUNTER — Encounter (HOSPITAL_COMMUNITY): Payer: Medicare Other

## 2014-08-09 VITALS — BP 173/81 | HR 67 | Resp 16 | Ht 65.0 in | Wt 155.0 lb

## 2014-08-09 DIAGNOSIS — Z48812 Encounter for surgical aftercare following surgery on the circulatory system: Secondary | ICD-10-CM | POA: Diagnosis not present

## 2014-08-09 DIAGNOSIS — I739 Peripheral vascular disease, unspecified: Secondary | ICD-10-CM

## 2014-08-09 DIAGNOSIS — Z87891 Personal history of nicotine dependence: Secondary | ICD-10-CM | POA: Insufficient documentation

## 2014-08-09 DIAGNOSIS — I6523 Occlusion and stenosis of bilateral carotid arteries: Secondary | ICD-10-CM | POA: Diagnosis not present

## 2014-08-09 DIAGNOSIS — I10 Essential (primary) hypertension: Secondary | ICD-10-CM | POA: Diagnosis not present

## 2014-08-09 DIAGNOSIS — Z9889 Other specified postprocedural states: Secondary | ICD-10-CM

## 2014-08-09 NOTE — Patient Instructions (Signed)

## 2014-08-09 NOTE — Progress Notes (Addendum)
[facility] [place] HISTORY AND PHYSICAL   MRN : [id number]  History of Present Illness:   Tara Walters is a 81 y.o. female patient of Dr. Edilia Bo who is s/p left CEA in 2009 and also is followed for moderate PAD. She returns for routine surveillance. She is a resident of [facility].   Pt denies claudication in legs with walking, she does report neuropathy in her feet, slightly worse with walking. Pt. denies rest pain; denies non healing ulcers on legs/feet.  Pt states her blood pressure later in the day is better, higher in the morning and when she is upset.  Patient has Negative history of TIA or stroke symptom. The patient denies amaurosis fugax or monocular blindness. The patient denies facial drooping. Pt. denies hemiplegia. The patient denies receptive or expressive aphasia. Pt. denies weakness.  She walks 20 minutes twice daily, 7 days/week, using walker.  Pt Diabetic: No Pt smoker: former smoker, quit in the 1970's   Pt meds include: Statin :No, pt states her cholesterol is OK ASA: No Other anticoagulants/antiplatelets: Plavix  Current Outpatient Prescriptions  Medication Sig Dispense Refill  . acetaminophen (TYLENOL) 325 MG tablet Take 325 mg by mouth. Take 2 tablets three times daily for pain relief. Take two tablets every 6 hours as needed for pain    . alum & mag hydroxide-simeth (MYLANTA) 200-200-20 MG/5ML suspension Take 5 mLs by mouth at bedtime. Give 150 mL qhs; take twice daily    . amLODipine (NORVASC) 10 MG tablet Take one tablet daily for blood pressure    . Arnica 20 % TINC Apply topically as needed.    . Artificial Tear (GENTEAL) GEL Apply to eye 2 (two) times daily. Apply twice daily to both eyes to help dry eyes     . B Complex-C (SUPER B COMPLEX PO) Take by mouth. with Vit C supplement: take 2 tablets every morning for hair, skin and nails    . clopidogrel (PLAVIX) 75 MG tablet Take one tablet daily     . diclofenac sodium (VOLTAREN) 1 % GEL Apply 4 g topically 4 (four) times daily. To knees 100 g 3  . esomeprazole (NEXIUM) 40 MG capsule Take 40 mg by mouth 2 (two) times daily. One twice daily to reduce stomach acid     . eszopiclone (LUNESTA) 2 MG TABS Take 1 tablet (2 mg total) by mouth at bedtime. Take immediately before bedtime    . fluocinonide (LIDEX) 0.05 % external solution Apply topically. Apply to scalp as needed    . hydrocortisone cream 1 % Apply topically. Apply to affected areas twice daily as needed for itch    . loratadine (CLARITIN) 10 MG tablet Take 10 mg by mouth daily.    Marland Kitchen MYRBETRIQ 50 MG TB24 Take 2 tablets by mouth daily.     Marland Kitchen Nystatin 1000000 UNITS CAPS Take by mouth. Apply cream to peri-rectal area after each bowel movement    . polyethylene glycol powder (GLYCOLAX/MIRALAX) powder Take 17gram daily as needed    . senna (SENOKOT) 8.6 MG tablet Take 1 tablet by mouth daily. 2 tablet q hs     No current facility-administered medications for this visit.    Past Medical History  Diagnosis Date  . Hypertension   . Anxiety   . Abnormality of gait   . Irritable bowel syndrome 03/11/2010  . Urge urinary incontinence   . Allergic rhinitis   . COPD (chronic obstructive pulmonary disease)   .  Diverticulosis of colon   . Peripheral vascular disease   . Carotid stenosis, right     asymptomatic  <40%  right  ica  and right eca  . PAD (peripheral artery disease) monitored by dr Rubye Oaks    moderate  . Claudication, intermittent   . Internal hemorrhoids   . History of esophagitis   . History of hypothyroidism   . Arthritis   . Unspecified hereditary and idiopathic peripheral neuropathy   . Carpal tunnel syndrome on both sides   . Fracture, calcaneus closed     Social History History  Substance Use Topics  . Smoking status: Former Smoker -- 1.00 packs/day for 50 years    Types: Cigarettes    Quit date: 05/27/1983  . Smokeless tobacco: Never Used  . Alcohol  Use: No    Family History Family History  Problem Relation Age of Onset  . Heart attack Mother   . Cancer Father     Leukemia  . Diabetes Father     Surgical History Past Surgical History  Procedure Laterality Date  . Carotid endarterectomy Left 11-18-2007  . Dilation and curettage of uterus  1973  . Colonoscopy  2005    mild diverticulosis  . Cataract extraction w/ intraocular lens implant Right 11/2012  . Cystoscopy with injection N/A 09/06/2013    Procedure: CYSTOSCOPY WITH BOTOX  INJECTION;  Surgeon: Martina Sinner, MD;  Location: [facility];  Service: Urology;  Laterality: N/A;    No Known Allergies  Current Outpatient Prescriptions  Medication Sig Dispense Refill  . acetaminophen (TYLENOL) 325 MG tablet Take 325 mg by mouth. Take 2 tablets three times daily for pain relief. Take two tablets every 6 hours as needed for pain    . alum & mag hydroxide-simeth (MYLANTA) 200-200-20 MG/5ML suspension Take 5 mLs by mouth at bedtime. Give 150 mL qhs; take twice daily    . amLODipine (NORVASC) 10 MG tablet Take one tablet daily for blood pressure    . Arnica 20 % TINC Apply topically as needed.    . Artificial Tear (GENTEAL) GEL Apply to eye 2 (two) times daily. Apply twice daily to both eyes to help dry eyes     . B Complex-C (SUPER B COMPLEX PO) Take by mouth. with Vit C supplement: take 2 tablets every morning for hair, skin and nails    . clopidogrel (PLAVIX) 75 MG tablet Take one tablet daily    . diclofenac sodium (VOLTAREN) 1 % GEL Apply 4 g topically 4 (four) times daily. To knees 100 g 3  . esomeprazole (NEXIUM) 40 MG capsule Take 40 mg by mouth 2 (two) times daily. One twice daily to reduce stomach acid     . eszopiclone (LUNESTA) 2 MG TABS Take 1 tablet (2 mg total) by mouth at bedtime. Take immediately before bedtime    . fluocinonide (LIDEX) 0.05 % external solution Apply topically. Apply to scalp as needed    . hydrocortisone cream 1 % Apply  topically. Apply to affected areas twice daily as needed for itch    . loratadine (CLARITIN) 10 MG tablet Take 10 mg by mouth daily.    Marland Kitchen MYRBETRIQ 50 MG TB24 Take 2 tablets by mouth daily.     Marland Kitchen Nystatin 1000000 UNITS CAPS Take by mouth. Apply cream to peri-rectal area after each bowel movement    . polyethylene glycol powder (GLYCOLAX/MIRALAX) powder Take 17gram daily as needed    . senna (SENOKOT) 8.6 MG tablet  Take 1 tablet by mouth daily. 2 tablet q hs     No current facility-administered medications for this visit.     REVIEW OF SYSTEMS: See HPI for pertinent positives and negatives.  Physical Examination Filed Vitals:   08/09/14 1230 08/09/14 1235  BP: 163/76 173/81  Pulse: 69 67  Resp:  16  Height:  5\' 5"  (1.651 m)  Weight:  155 lb (70.308 kg)  SpO2:  94%   Body mass index is 25.79 kg/(m^2).   Repeat blood pressure about 30-45 minutes later: 150/80, right arm, regular sized cuff. Pt advised to see her PCP re her blood pressure, states her blood pressure will go down later in the day.  General: WDWN in NAD Gait: Normal HENT: WNL Eyes: Pupils equal Pulmonary: normal non-labored breathing , without Rales, rhonchi, wheezing Cardiac: RRR, murmer detected  Abdomen: soft, NT, no masses Skin: no rashes, ulcers noted; no Gangrene , no cellulitis; no open wounds;   VASCULAR EXAM  Carotid Bruits Left Right   transmitted cardiac murmur Transmitted cardiac murmur     VASCULAR EXAM: Extremities without ischemic changes  without Gangrene; without open wounds.     LE Pulses LEFT RIGHT   FEMORAL  palpable  palpable    POPLITEAL not palpable  not palpable   POSTERIOR TIBIAL not palpable  not palpable    DORSALIS PEDIS  ANTERIOR TIBIAL not palpable  not  palpable     Musculoskeletal: no muscle wasting or atrophy; no edema. Wearing knee high graduated compression hose. Neurologic: A&O X 3; Appropriate Affect ;  SENSATION: normal; MOTOR FUNCTION: 5/5 Symmetric, CN 2-12 intact Speech is fluent/normal         Non-Invasive Vascular Imaging:  CEREBROVASCULAR DUPLEX EVALUATION    INDICATION: Carotid stenosis    PREVIOUS INTERVENTION(S): Left carotid endarterectomy 11/18/2007    DUPLEX EXAM:     RIGHT  LEFT  Peak Systolic Velocities (cm/s) End Diastolic Velocities (cm/s) Plaque LOCATION Peak Systolic Velocities (cm/s) End Diastolic Velocities (cm/s) Plaque  112 16  CCA PROXIMAL 98 17   82 15  CCA MID 99 14 HT  63 12 HT CCA DISTAL 129 16 HT  193 12 HT ECA 131 8   112 29 HT ICA PROXIMAL 103 23   68 19  ICA MID 91 25   85 26  ICA DISTAL 109 27     1.4 ICA / CCA Ratio (PSV) carotid endarterectomy  Antegrade Vertebral Flow Antegrade  156 Brachial Systolic Pressure (mmHg) 156  Triphasic Brachial Artery Waveforms Triphasic    Plaque Morphology:  HM = Homogeneous, HT = Heterogeneous, CP = Calcific Plaque, SP = Smooth Plaque, IP = Irregular Plaque     ADDITIONAL FINDINGS:     IMPRESSION: Right internal carotid artery stenosis present in the less than 40% range. Right external carotid artery stenosis present. Left internal carotid artery is patent with history of carotid endarterectomy, mild hyperplasia present involving the proximal to mid patch without hemodynamically significant changes present.    Compared to the previous exam:  Essentially unchanged since previous study on 08/03/2013.   ABI (Date: 08/09/2014)  R: 0.69 (08/03/13,0.69), DP: triphasic, PT: monophasic, TBI: 0.62  L: 0.62 (0.62), DP: biphasic, PT: monophasic, TBI: 0.46   ASSESSMENT:  Emilia BeckJanet F Desha is a 81 y.o. female who is s/p left CEA in 2009 and also is followed for moderate PAD. Today's carotid Duplex reveals less than 40% right ICA stenosis and left  internal carotid artery is patent with history  of carotid endarterectomy, mild hyperplasia present involving the proximal to mid patch without hemodynamically significant changes present.  Essentially unchanged since previous study on 08/03/2013.  She does not seem to describe claudication symptoms with walking, walks 40 minutes daily, has no tissue loss. Stable ankle and toe indices in the last year, moderate bilateral arterial occlusive disease.  Repeat blood pressure about 30-45 minutes later: 150/80, right arm, regular sized cuff. Pt advised to see her PCP re her blood pressure, states her blood pressure will go down later in the day.  PLAN:   Based on today's exam and non-invasive vascular lab results, the patient will follow up in 1 year with the following tests carotid Duplex and ABI's. I discussed in depth with the patient the nature of atherosclerosis, and emphasized the importance of maximal medical management including strict control of blood pressure, blood glucose, and lipid levels, obtaining regular exercise, and cessation of smoking.  The patient is aware that without maximal medical management the underlying atherosclerotic disease process will progress, limiting the benefit of any interventions.  The patient was given information about stroke prevention and what symptoms should prompt the patient to seek immediate medical care.  The patient was given information about PAD including signs, symptoms, treatment, what symptoms should prompt the patient to seek immediate medical care, and risk reduction measures to take. Thank you for allowing [place] to participate in this patient's care.  Charisse March, RN, MSN, FNP-C [facility] Office: [phone]  Clinic MD: Edilia Bo 08/09/2014 12:42 PM

## 2014-08-10 ENCOUNTER — Other Ambulatory Visit: Payer: Self-pay | Admitting: *Deleted

## 2014-08-10 DIAGNOSIS — I6523 Occlusion and stenosis of bilateral carotid arteries: Secondary | ICD-10-CM

## 2014-08-10 DIAGNOSIS — I739 Peripheral vascular disease, unspecified: Secondary | ICD-10-CM

## 2014-09-11 ENCOUNTER — Encounter: Payer: Self-pay | Admitting: Internal Medicine

## 2014-09-12 ENCOUNTER — Non-Acute Institutional Stay: Payer: Medicare Other | Admitting: Internal Medicine

## 2014-09-12 ENCOUNTER — Encounter: Payer: Self-pay | Admitting: Internal Medicine

## 2014-09-12 VITALS — BP 122/78 | HR 76 | Temp 98.0°F | Wt 158.0 lb

## 2014-09-12 DIAGNOSIS — R269 Unspecified abnormalities of gait and mobility: Secondary | ICD-10-CM

## 2014-09-12 DIAGNOSIS — K219 Gastro-esophageal reflux disease without esophagitis: Secondary | ICD-10-CM

## 2014-09-12 DIAGNOSIS — I739 Peripheral vascular disease, unspecified: Secondary | ICD-10-CM | POA: Diagnosis not present

## 2014-09-12 DIAGNOSIS — I6523 Occlusion and stenosis of bilateral carotid arteries: Secondary | ICD-10-CM | POA: Diagnosis not present

## 2014-09-12 DIAGNOSIS — Z23 Encounter for immunization: Secondary | ICD-10-CM

## 2014-09-12 DIAGNOSIS — E039 Hypothyroidism, unspecified: Secondary | ICD-10-CM

## 2014-09-12 DIAGNOSIS — G629 Polyneuropathy, unspecified: Secondary | ICD-10-CM

## 2014-09-12 DIAGNOSIS — I10 Essential (primary) hypertension: Secondary | ICD-10-CM

## 2014-09-12 NOTE — Progress Notes (Signed)
Patient ID: Tara Walters, female   DOB: Jun 27, 1921, 81 y.o.   MRN: [id number]   Location:  [facility]  Code Status: DNR Goals of Care: Advanced Directive information Does patient have an advance directive?: Yes, Type of Advance Directive: Healthcare Power of Attorney, Does patient want to make changes to advanced directive?: No - Patient declined   No Known Allergies  Chief Complaint  Patient presents with  . Medical Management of Chronic Issues    blood pressure, anxiety, thyroid, COPD    HPI: Patient is a 81 y.o.  seen in the office today for her medical mgt of chronic diseases.    Gets her annual dopplers of her carotids each march (left CEA 2009).    Doing pretty well.  Getting around with her rollator walker.  Walks in parking lot around skilled area.  Goes to meals.  Likes to read and keeps herself busy.    BP jumps around.  Peaks in am, but drops considerably in afternoons. Has been adjusted.  BP was excellent today.   Has some neuropathy in feet and legs,and does have known PAD she follows with vascular about.    Voltaren and tylenol still helpful.  Did not tolerate low dose of lyrica we tried when I saw her for the first time.  Continues on her nexium--notes some GERD symptoms with fried items or a lot tomato sauce.  No longer drinks alcohol and only one cup of decaf coffee anymore.    Still has difficulty with nasal congestion and takes claritin all year long   On higher dose of myrbetriq with some decrease in urinary frequency, but still must get up at night Uses lunesta nightly for sleep  Agrees to prevnar.  Does not want mammograms anymore at her age--would not want surgery, chemo, XRT if something were found.    Review of Systems:  Review of Systems  Constitutional: Negative for malaise/fatigue.  HENT: Negative for congestion.   Eyes: Negative for blurred vision.  Respiratory: Negative for shortness of breath.   Cardiovascular: Positive for  claudication. Negative for chest pain.  Gastrointestinal: Negative for abdominal pain, constipation, blood in stool and melena.  Genitourinary: Positive for urgency. Negative for dysuria and frequency.       Urinary incontinence and nocturia  Musculoskeletal: Positive for back pain and joint pain. Negative for falls.  Skin: Negative for rash.  Neurological: Negative for dizziness, weakness and headaches.  Psychiatric/Behavioral: Negative for depression and memory loss. The patient has insomnia.      Past Medical History  Diagnosis Date  . Hypertension   . Anxiety   . Abnormality of gait   . Irritable bowel syndrome 03/11/2010  . Urge urinary incontinence   . Allergic rhinitis   . COPD (chronic obstructive pulmonary disease)   . Diverticulosis of colon   . Peripheral vascular disease   . Carotid stenosis, right     asymptomatic  <40%  right  ica  and right eca  . PAD (peripheral artery disease) monitored by dr Rubye Oaks    moderate  . Claudication, intermittent   . Internal hemorrhoids   . History of esophagitis   . History of hypothyroidism   . Arthritis   . Unspecified hereditary and idiopathic peripheral neuropathy   . Carpal tunnel syndrome on both sides   . Fracture, calcaneus closed     Past Surgical History  Procedure Laterality Date  . Carotid endarterectomy Left 11-18-2007  . Dilation and curettage of uterus  1973  . Colonoscopy  2005    mild diverticulosis  . Cataract extraction w/ intraocular lens implant Right 11/2012  . Cystoscopy with injection N/A 09/06/2013    Procedure: CYSTOSCOPY WITH BOTOX  INJECTION;  Surgeon: Martina SinnerScott A MacDiarmid, MD;  [facility];  Service: Urology;  Laterality: N/A;    Social History:   reports that she quit smoking about 31 years ago. Her smoking use included Cigarettes. She has a 50 pack-year smoking history. She has never used smokeless tobacco. She reports that she does not drink alcohol or use illicit  drugs.  Family History  Problem Relation Age of Onset  . Heart attack Mother   . Cancer Father     Leukemia  . Diabetes Father     Medications: Patient's Medications  New Prescriptions   No medications on file  Previous Medications   ACETAMINOPHEN (TYLENOL) 325 MG TABLET    Take 325 mg by mouth. Take 2 tablets three times daily for pain relief. Take two tablets every 6 hours as needed for pain   ALUM & MAG HYDROXIDE-SIMETH (MYLANTA) 200-200-20 MG/5ML SUSPENSION    Take 5 mLs by mouth at bedtime. Give 150 mL qhs; take twice daily   AMLODIPINE (NORVASC) 10 MG TABLET    Take one tablet daily for blood pressure   ARNICA 20 % TINC    Apply topically as needed.   ARTIFICIAL TEAR (GENTEAL) GEL    Apply to eye 2 (two) times daily. Apply twice daily to both eyes to help dry eyes    B COMPLEX-C (SUPER B COMPLEX PO)    Take by mouth. with Vit C supplement: take 2 tablets every morning for hair, skin and nails   CLOPIDOGREL (PLAVIX) 75 MG TABLET    Take one tablet daily   DICLOFENAC SODIUM (VOLTAREN) 1 % GEL    Apply 4 g topically 4 (four) times daily. To knees   ESOMEPRAZOLE (NEXIUM) 40 MG CAPSULE    Take 40 mg by mouth 2 (two) times daily. One twice daily to reduce stomach acid    ESZOPICLONE (LUNESTA) 2 MG TABS    Take 1 tablet (2 mg total) by mouth at bedtime. Take immediately before bedtime   HYDROCORTISONE CREAM 1 %    Apply topically. Apply to affected areas twice daily as needed for itch   LORATADINE (CLARITIN) 10 MG TABLET    Take 10 mg by mouth daily.   MYRBETRIQ 50 MG TB24    Take 2 tablets by mouth daily.    NYSTATIN 1000000 UNITS CAPS    Take by mouth. Apply cream to peri-rectal area after each bowel movement   POLYETHYLENE GLYCOL POWDER (GLYCOLAX/MIRALAX) POWDER    Take 17gram daily as needed   SENNA (SENOKOT) 8.6 MG TABLET    Take 1 tablet by mouth daily. 2 tablet q hs  Modified Medications   No medications on file  Discontinued Medications   FLUOCINONIDE (LIDEX) 0.05 % EXTERNAL  SOLUTION    Apply topically. Apply to scalp as needed     Physical Exam: Filed Vitals:   09/12/14 1415  BP: 122/78  Pulse: 76  Temp: 98 F (36.7 C)  TempSrc: Oral  Weight: 158 lb (71.668 kg)  SpO2: 95%  Physical Exam  Constitutional: She is oriented to person, place, and time. She appears well-developed and well-nourished. No distress.  Cardiovascular: Normal rate, regular rhythm, normal heart sounds and intact distal pulses.   Pulmonary/Chest: Effort normal and breath sounds normal. No  respiratory distress.  Abdominal: Soft. Bowel sounds are normal. She exhibits no distension and no mass. There is no tenderness.  Musculoskeletal: Normal range of motion. She exhibits no edema or tenderness.  Walks steadily with her rollator walker  Neurological: She is alert and oriented to person, place, and time.  Skin: Skin is warm and dry.  Psychiatric: She has a normal mood and affect.    Labs reviewed: Basic Metabolic Panel:  Recent Labs  16/10/96  NA 137  K 4.5  BUN 18  CREATININE 0.8   Liver Function Tests:  Recent Labs  06/12/14  AST 18  ALT 16  ALKPHOS 63   No results for input(s): LIPASE, AMYLASE in the last 8760 hours. No results for input(s): AMMONIA in the last 8760 hours. CBC:  Recent Labs  06/12/14  WBC 4.2  HGB 12.2  HCT 37  PLT 205   Assessment/Plan 1. Essential hypertension -bp at goal with norvasc  alone  2. Neuropathy, peripheral -has not tolerated lyrica for this or gabapentin previously -continues with tylenol and voltaren gel  3. Gait disorder -gait is steady when using rollator walker--walks regularly for exercise, very active resident -has had heal fx in past -Noted after visit:  should have bone density done-? Done in our old EMR.    4. Gastroesophageal reflux disease without esophagitis -continues on nexium--does have symptoms with certain foods and does her best to avoid too much coffee and no longer drinks alcohol which have  previously contributed  5. Hypothyroidism, unspecified hypothyroidism type -historically listed, but not on synthroid?  Was it subclinical? -last tsh in March was normal  6. PAD (peripheral artery disease) -follows with Dr. Edilia Bo, et al  -gets her carotids screened and has been noted to have claudication in addition to her neuropathic pain  7. Need for vaccination with 13-polyvalent pneumococcal conjugate vaccine - Pneumococcal conjugate vaccine 13-valent--prevnar given today  Labs/tests ordered:   Orders Placed This Encounter  Procedures  . Pneumococcal conjugate vaccine 13-valent    Next appt:  Keep 10/26 physical with Shanda Bumps  [place] Pherigo L. Chyann Ambrocio, D.O. Geriatrics [facility] [address], [place] [address] Cell Phone (Mon-Fri 8am-5pm):  [phone] On Call:  [phone] & follow prompts after 5pm & weekends Office Phone:  [phone] Office Fax:  [phone]

## 2014-10-18 ENCOUNTER — Non-Acute Institutional Stay: Payer: Medicare Other | Admitting: Internal Medicine

## 2014-10-18 ENCOUNTER — Encounter: Payer: Self-pay | Admitting: Internal Medicine

## 2014-10-18 DIAGNOSIS — R42 Dizziness and giddiness: Secondary | ICD-10-CM

## 2014-10-18 DIAGNOSIS — W19XXXA Unspecified fall, initial encounter: Secondary | ICD-10-CM | POA: Diagnosis not present

## 2014-10-18 DIAGNOSIS — S0001XA Abrasion of scalp, initial encounter: Secondary | ICD-10-CM | POA: Diagnosis not present

## 2014-10-18 DIAGNOSIS — Y92129 Unspecified place in nursing home as the place of occurrence of the external cause: Principal | ICD-10-CM

## 2014-10-18 NOTE — Progress Notes (Signed)
Patient ID: Tara Walters, female   DOB: 1921/12/05, 81 y.o.   MRN: [id number]  Location:  [facility] Provider:  Gwenith Spitziffany L. Renato Gailseed, D.O., C.M.D.  Code Status:  DNR Goals of care: Advanced Directive information Does patient have an advance directive?: Yes, Type of Advance Directive: Healthcare Power of Attorney, Does patient want to make changes to advanced directive?: No - Patient declined  Chief Complaint  Patient presents with  . Acute Visit    near syncope in parking lot of health care   HPI:  81 yo white female with h/o OA, peripheral neuropathy, prior carotid stenosis with endarterectomy, PVD, constipation, GERD, urinary incontinence was seen for an acute visit when staff noted she was found down in the health care parking lot.  She says she'd been ambulating with her rollator walker this afternoon as is customary for her.  She was getting warm (it's in the 80s today and sunny), her head began to spin, and she fell backwards hitting the occipital region of her head.  Staff came out to assess her and got her up into her wheelchair.  The only thing hurting was the back of her head and she was still having some spinning.  VS were obtained indoors and still revealed hypertension and fever, normal pulse and saturations.  She was able to move all extremities and speak normally.  She felt weak and had some difficulty holding her legs up long enough to be wheeled back into the office area.  Review of Systems:  Review of Systems  Constitutional: Positive for fever and malaise/fatigue.  Respiratory: Negative for shortness of breath.   Cardiovascular: Negative for chest pain and palpitations.  Gastrointestinal: Negative for abdominal pain.  Genitourinary: Negative for dysuria.  Musculoskeletal: Positive for falls.  Neurological: Positive for dizziness, weakness and headaches.  Psychiatric/Behavioral: Negative for depression and memory loss.    Past Medical History  Diagnosis Date  .  Hypertension   . Anxiety   . Abnormality of gait   . Irritable bowel syndrome 03/11/2010  . Urge urinary incontinence   . Allergic rhinitis   . COPD (chronic obstructive pulmonary disease)   . Diverticulosis of colon   . Peripheral vascular disease   . Carotid stenosis, right     asymptomatic  <40%  right  ica  and right eca  . PAD (peripheral artery disease) monitored by dr Rubye Oaksdickerson    moderate  . Claudication, intermittent   . Internal hemorrhoids   . History of esophagitis   . History of hypothyroidism   . Arthritis   . Unspecified hereditary and idiopathic peripheral neuropathy   . Carpal tunnel syndrome on both sides   . Fracture, calcaneus closed     Patient Active Problem List   Diagnosis Date Noted  . Occlusion and stenosis of carotid artery without mention of cerebral infarction 08/03/2013  . PVD (peripheral vascular disease) with claudication 08/03/2013  . Edema 02/28/2013  . Vaginal atrophy 12/12/2012  . Hair thinning 12/12/2012  . Carpal tunnel syndrome 12/09/2012  . Cataract cortical, senile 12/09/2012  . Spastic colon 03/11/2010  . PVD (peripheral vascular disease) 08/28/2008  . Hemorrhoids, internal 08/28/2008  . HTN (hypertension) 05/22/2008  . Neuropathy, peripheral 02/14/2008  . Carotid stenosis 11/19/2007  . Vitamin D deficiency 02/01/2007  . Anxiety 12/16/2006  . Gait disorder 12/02/2006  . Insomnia 08/31/2006  . Constipation 05/20/2006  . Urinary incontinence 01/05/2006  . Urinary frequency 01/05/2006  . Hypothyroidism 09/22/2005  . GERD (gastroesophageal reflux  disease) 09/08/2005    No Known Allergies  Medications: Patient's Medications  New Prescriptions   No medications on file  Previous Medications   ACETAMINOPHEN (TYLENOL) 325 MG TABLET    Take 325 mg by mouth. Take 2 tablets three times daily for pain relief. Take two tablets every 6 hours as needed for pain   ALUM & MAG HYDROXIDE-SIMETH (MYLANTA) 200-200-20 MG/5ML SUSPENSION     Take 5 mLs by mouth at bedtime. Give 150 mL qhs; take twice daily   AMLODIPINE (NORVASC) 10 MG TABLET    Take one tablet daily for blood pressure   ARNICA 20 % TINC    Apply topically as needed.   ARTIFICIAL TEAR (GENTEAL) GEL    Apply to eye 2 (two) times daily. Apply twice daily to both eyes to help dry eyes    B COMPLEX-C (SUPER B COMPLEX PO)    Take by mouth. with Vit C supplement: take 2 tablets every morning for hair, skin and nails   CLOPIDOGREL (PLAVIX) 75 MG TABLET    Take one tablet daily   DICLOFENAC SODIUM (VOLTAREN) 1 % GEL    Apply 4 g topically 4 (four) times daily. To knees   ESOMEPRAZOLE (NEXIUM) 40 MG CAPSULE    Take 40 mg by mouth 2 (two) times daily. One twice daily to reduce stomach acid    ESZOPICLONE (LUNESTA) 2 MG TABS    Take 1 tablet (2 mg total) by mouth at bedtime. Take immediately before bedtime   HYDROCORTISONE CREAM 1 %    Apply topically. Apply to affected areas twice daily as needed for itch   LORATADINE (CLARITIN) 10 MG TABLET    Take 10 mg by mouth daily.   MYRBETRIQ 50 MG TB24    Take 2 tablets by mouth daily.    NYSTATIN 1000000 UNITS CAPS    Take by mouth. Apply cream to peri-rectal area after each bowel movement   POLYETHYLENE GLYCOL POWDER (GLYCOLAX/MIRALAX) POWDER    Take 17gram daily as needed   SENNA (SENOKOT) 8.6 MG TABLET    Take 1 tablet by mouth daily. 2 tablet q hs  Modified Medications   No medications on file  Discontinued Medications   No medications on file    Physical Exam: Filed Vitals:   10/18/14 1347  BP: 180/80  Pulse: 90  Temp: 100 F (37.8 C)  Resp: 16  SpO2: 95%   There is no weight on file to calculate BMI.  Physical Exam  Constitutional: She is oriented to person, place, and time.  Cardiovascular: Normal rate, regular rhythm and normal heart sounds.   Pulmonary/Chest: Effort normal and breath sounds normal.  Musculoskeletal: Normal range of motion.  Neurological: She is alert and oriented to person, place, and time.  No cranial nerve deficit. She exhibits normal muscle tone. Coordination normal.  Skin:  Abrasion to the back of her occipital area with some minor bleeding; also has abrasion of left elbow that was cleaned and a bandaid applied by clinic nurse  Psychiatric: She has a normal mood and affect.    Labs reviewed: Basic Metabolic Panel:  Recent Labs  16/10/96  NA 137  K 4.5  BUN 18  CREATININE 0.8    Liver Function Tests:  Recent Labs  06/12/14  AST 18  ALT 16  ALKPHOS 63    CBC:  Recent Labs  06/12/14  WBC 4.2  HGB 12.2  HCT 37  PLT 205    Lab Results  Component Value  Date   TSH 1.51 07/26/2013    Patient Care Team: [name], DO as PCP - General (Geriatric Medicine) [facility]  Assessment/Plan 1. Fall at nursing home, initial encounter -when pt got overheated and dizzy and fell backwards -remained a bit weak afterwards -encouraged improved po intake and hydration when walking outdoors in hot weather  2. Dizziness -seems due to heat, will monitor for concussion after she struck her head with vitals and neurochecks per [facility] protocol  3. Scalp abrasion, initial encounter -was cleaned and staff applying ice to the back of her head -will monitor for concussion  Family/ staff Communication: discussed with response team and clinic nurse  Brantlee Penn L. Eschol Auxier, D.O. Geriatrics [facility] [address], [place] [address] Cell Phone (Mon-Fri 8am-5pm):  [phone] On Call:  [phone] & follow prompts after 5pm & weekends Office Phone:  [phone] Office Fax:  [phone]

## 2014-12-13 ENCOUNTER — Non-Acute Institutional Stay: Payer: Medicare Other | Admitting: Internal Medicine

## 2014-12-13 ENCOUNTER — Encounter: Payer: Self-pay | Admitting: Internal Medicine

## 2014-12-13 VITALS — BP 110/68 | HR 70 | Temp 97.4°F | Resp 16 | Wt 160.0 lb

## 2014-12-13 DIAGNOSIS — I6523 Occlusion and stenosis of bilateral carotid arteries: Secondary | ICD-10-CM

## 2014-12-13 DIAGNOSIS — R0981 Nasal congestion: Secondary | ICD-10-CM

## 2014-12-13 DIAGNOSIS — R531 Weakness: Secondary | ICD-10-CM | POA: Diagnosis not present

## 2014-12-13 DIAGNOSIS — I10 Essential (primary) hypertension: Secondary | ICD-10-CM | POA: Diagnosis not present

## 2014-12-13 MED ORDER — SALINE SPRAY 0.65 % NA SOLN: 1.0000 | Freq: Two times a day (BID) | NASAL | Status: DC

## 2014-12-13 NOTE — Progress Notes (Signed)
Patient ID: Tara Walters, female   DOB: June 08, 1921, 81 y.o.   MRN: [id number]   Location:  [facility] Code Status: DNR  Goals of Care:Advanced Directive information Does patient have an advance directive?: Yes, Type of Advance Directive: Healthcare Power of HarrahAttorney;Out of facility DNR (pink MOST or yellow form);Living will, Pre-existing out of facility DNR order (yellow form or pink MOST form): Yellow form placed in chart (order not valid for inpatient use), Does patient want to make changes to advanced directive?: No - Patient declined  Chief Complaint  Patient presents with  . Acute Visit    congestion -nasaly, sinus pressure, weak    HPI: Patient is a 81 y.o. white female [place] resident seen in the [facility] today for an acute visit due to nasal congestion, sinus pressure in her maxillary sinus area for the past week.  Review of matrix shows she has been afebrile.  Nose gets dry and congested and then begins to bleed.  Has only had cough drops.  No ear pressure or soreness.  Throat stays a bit sore.  Has chronic cough, too.  Cannot really bring up mucus--has chronic dry cough.  Normal color, but sometimes bloody mucus in her nose.  Gets to where she can't breathe well through her nose.  BPs have been elevated into the 150s and as high as the 170s systolic sometimes.  It's checked after she comes back from breakfast, but she says she is asked to sit for a few mins first before it is checked.  Here this am, bp is 110/68.    Since she had the fall in the parking lot, she's been weaker and more cautious.  Feels this is just due to her age.  Said she had a prior near syncopal episode in independent living also several years ago.  Felt like she was going down, but did not recall event later on.  Continues to have someone walk with her.    Review of Systems:  Review of Systems  Constitutional: Negative for fever, chills and malaise/fatigue.  HENT: Positive for congestion,  nosebleeds and sore throat. Negative for ear discharge and ear pain.   Eyes: Negative for blurred vision.  Respiratory: Negative for shortness of breath and stridor.   Cardiovascular: Negative for chest pain and leg swelling.  Gastrointestinal: Negative for nausea.  Genitourinary: Negative for dysuria.  Musculoskeletal: Positive for joint pain and falls.       Knees  Neurological: Positive for weakness and headaches. Negative for dizziness and loss of consciousness.    Past Medical History  Diagnosis Date  . Hypertension   . Anxiety   . Abnormality of gait   . Irritable bowel syndrome 03/11/2010  . Urge urinary incontinence   . Allergic rhinitis   . COPD (chronic obstructive pulmonary disease)   . Diverticulosis of colon   . Peripheral vascular disease   . Carotid stenosis, right     asymptomatic  <40%  right  ica  and right eca  . PAD (peripheral artery disease) monitored by dr Rubye Oaksdickerson    moderate  . Claudication, intermittent   . Internal hemorrhoids   . History of esophagitis   . History of hypothyroidism   . Arthritis   . Unspecified hereditary and idiopathic peripheral neuropathy   . Carpal tunnel syndrome on both sides   . Fracture, calcaneus closed     Past Surgical History  Procedure Laterality Date  . Carotid endarterectomy Left 11-18-2007  . Dilation  and curettage of uterus  1973  . Colonoscopy  2005    mild diverticulosis  . Cataract extraction w/ intraocular lens implant Right 11/2012  . Cystoscopy with injection N/A 09/06/2013    Procedure: CYSTOSCOPY WITH BOTOX  INJECTION;  Surgeon: Martina Sinner, MD;  Location: [facility];  Service: Urology;  Laterality: N/A;    Social History:   reports that she quit smoking about 31 years ago. Her smoking use included Cigarettes. She has a 50 pack-year smoking history. She has never used smokeless tobacco. She reports that she does not drink alcohol or use illicit drugs.  No Known  Allergies  Medications: Patient's Medications  New Prescriptions   SODIUM CHLORIDE (OCEAN) 0.65 % SOLN NASAL SPRAY    Place 1 spray into both nostrils 2 (two) times daily.  Previous Medications   ACETAMINOPHEN (TYLENOL) 325 MG TABLET    Take 325 mg by mouth. Take 2 tablets three times daily for pain relief. Take two tablets every 6 hours as needed for pain   ALUM & MAG HYDROXIDE-SIMETH (MYLANTA) 200-200-20 MG/5ML SUSPENSION    Take 5 mLs by mouth at bedtime. Give 150 mL qhs; take twice daily   AMLODIPINE (NORVASC) 10 MG TABLET    Take one tablet daily for blood pressure   ARNICA 20 % TINC    Apply topically as needed.   ARTIFICIAL TEAR (GENTEAL) GEL    Apply to eye 2 (two) times daily. Apply twice daily to both eyes to help dry eyes    B COMPLEX-C (SUPER B COMPLEX PO)    Take by mouth. with Vit C supplement: take 2 tablets every morning for hair, skin and nails   CLOPIDOGREL (PLAVIX) 75 MG TABLET    Take one tablet daily   DICLOFENAC SODIUM (VOLTAREN) 1 % GEL    Apply 4 g topically 4 (four) times daily. To knees   ESOMEPRAZOLE (NEXIUM) 40 MG CAPSULE    Take 40 mg by mouth 2 (two) times daily. One twice daily to reduce stomach acid    ESZOPICLONE (LUNESTA) 2 MG TABS    Take 1 tablet (2 mg total) by mouth at bedtime. Take immediately before bedtime   HYDROCORTISONE CREAM 1 %    Apply topically. Apply to affected areas twice daily as needed for itch   LORATADINE (CLARITIN) 10 MG TABLET    Take 10 mg by mouth daily.   MYRBETRIQ 50 MG TB24    Take 2 tablets by mouth daily.    NYSTATIN 1000000 UNITS CAPS    Take by mouth. Apply cream to peri-rectal area after each bowel movement   POLYETHYLENE GLYCOL POWDER (GLYCOLAX/MIRALAX) POWDER    Take 17gram daily as needed   SENNA (SENOKOT) 8.6 MG TABLET    Take 1 tablet by mouth daily. 2 tablet q hs  Modified Medications   No medications on file  Discontinued Medications   No medications on file     Physical Exam: Filed Vitals:   12/13/14 0844  BP:  110/68  Pulse: 70  Temp: 97.4 F (36.3 C)  TempSrc: Oral  Resp: 16  Weight: 160 lb (72.576 kg)  SpO2: 93%   Body mass index is 26.63 kg/(m^2). Physical Exam  Constitutional: She is oriented to person, place, and time. She appears well-developed and well-nourished. No distress.  HENT:  Nasal congestion and dry mucus especially left nare; tenderness of sinuses  Cardiovascular: Normal rate, regular rhythm, normal heart sounds and intact distal pulses.   Pulmonary/Chest:  Effort normal and breath sounds normal. No respiratory distress.  Musculoskeletal: Normal range of motion.  Stooped posture; walks with rollator walker  Neurological: She is alert and oriented to person, place, and time.  Skin: Skin is warm and dry.     Labs reviewed: Basic Metabolic Panel:  Recent Labs  40/98/11  NA 137  K 4.5  BUN 18  CREATININE 0.8   Liver Function Tests:  Recent Labs  06/12/14  AST 18  ALT 16  ALKPHOS 63   No results for input(s): LIPASE, AMYLASE in the last 8760 hours. No results for input(s): AMMONIA in the last 8760 hours. CBC:  Recent Labs  06/12/14  WBC 4.2  HGB 12.2  HCT 37  PLT 205   Lipid Panel: No results for input(s): CHOL, HDL, LDLCALC, TRIG, CHOLHDL, LDLDIRECT in the last 8760 hours. No results found for: HGBA1C   Patient Care Team: [name], DO as PCP - General (Geriatric Medicine) [facility] Fletcher Anon, NP as Nurse Practitioner (Nurse Practitioner)  Assessment/Plan 1. Sinus congestion - chronic, but more bothersome recently so will add nasal spray to help with congestion - sodium chloride (OCEAN) 0.65 % SOLN nasal spray; Place 1 spray into both nostrils 2 (two) times daily.  Dispense: 88 mL; Refill: 0  2. Essential hypertension, benign -bp has been elevated at time it is taken -will move time to check it to 9am after her walk and sitting for 10-15 mins -seems amlodipine from 8am has not kicked in just yet  3.  Generalized weakness -gradually worsening -continue use of rollator and do walks only with caregiver   Labs/tests ordered:  No new Next appt:  Keep physical in October as scheduled  [place] Perry L. Farrell Broerman, D.O. Geriatrics [facility] [address], [place] [address] Cell Phone (Mon-Fri 8am-5pm):  [phone] On Call:  [phone] & follow prompts after 5pm & weekends Office Phone:  [phone] Office Fax:  [phone]

## 2015-02-28 DIAGNOSIS — Z23 Encounter for immunization: Secondary | ICD-10-CM | POA: Diagnosis not present

## 2015-03-21 ENCOUNTER — Non-Acute Institutional Stay: Payer: Medicare Other | Admitting: Internal Medicine

## 2015-03-21 ENCOUNTER — Encounter: Payer: Self-pay | Admitting: Internal Medicine

## 2015-03-21 VITALS — BP 142/68 | HR 60 | Temp 97.7°F | Ht 65.0 in | Wt 171.0 lb

## 2015-03-21 DIAGNOSIS — I10 Essential (primary) hypertension: Secondary | ICD-10-CM

## 2015-03-21 DIAGNOSIS — I739 Peripheral vascular disease, unspecified: Secondary | ICD-10-CM | POA: Diagnosis not present

## 2015-03-21 DIAGNOSIS — R32 Unspecified urinary incontinence: Secondary | ICD-10-CM | POA: Diagnosis not present

## 2015-03-21 DIAGNOSIS — E039 Hypothyroidism, unspecified: Secondary | ICD-10-CM

## 2015-03-21 DIAGNOSIS — G6289 Other specified polyneuropathies: Secondary | ICD-10-CM

## 2015-03-21 DIAGNOSIS — I6523 Occlusion and stenosis of bilateral carotid arteries: Secondary | ICD-10-CM

## 2015-03-21 DIAGNOSIS — I6529 Occlusion and stenosis of unspecified carotid artery: Secondary | ICD-10-CM | POA: Diagnosis not present

## 2015-03-21 DIAGNOSIS — G4700 Insomnia, unspecified: Secondary | ICD-10-CM | POA: Diagnosis not present

## 2015-03-21 DIAGNOSIS — K589 Irritable bowel syndrome without diarrhea: Secondary | ICD-10-CM

## 2015-03-21 DIAGNOSIS — F419 Anxiety disorder, unspecified: Secondary | ICD-10-CM | POA: Diagnosis not present

## 2015-03-21 DIAGNOSIS — Z0000 Encounter for general adult medical examination without abnormal findings: Secondary | ICD-10-CM | POA: Diagnosis not present

## 2015-03-21 NOTE — Progress Notes (Signed)
Location:  [facility]  Code Status: DNR Goals of Care: Advanced Directive information Does patient have an advance directive?: Yes, Type of Advance Directive: Healthcare Power of [place];Living will;Out of facility DNR (pink MOST or yellow form), Pre-existing out of facility DNR order (yellow form or pink MOST form): Yellow form placed in chart (order not valid for inpatient use)   Chief Complaint  Patient presents with  . Annual Exam    Comprehensive exam  . Medical Management of Chronic Issues    blood pressure, thyroid, COPD, anxiety     HPI: Patient is a 81 y.o. female seen in the office today for annual wellness exam.  She is doing well. No new complaints.   Exercise- she doesn't get regular exercise but walks a lot around the building and to eat. She has difficulty due to neuropathy. She used to enjoy walking outside but just doesn't have the energy anymore. She had one fall in May but did not sustain any injury. No recent falls.   No vision changes. She reads with large print. Is followed by opthalmologist.   She does not go to the dentist. She has dentures and cleans them herself. Dentures fit well. She is able to eat a full diet. Eats 3 meals a day.   Sleeping well except for having to get up 4-5 times per night to change her depends. She sometimes takes a nap after breakfast.  She is completely incontinent of urine during the day and night. . Has seen urologist and was told there was nothing more they could do to help her. Wears depends most of the time.   No longer gets mammograms. She has not had a bone scan and does not want to get one. She is not on a calcium or Vit D supplement and does not want to start one.    She lives in assisted living. Requires assistance with some ADL's. Mostly bathing and dressing. She is able to feed herself and ambulates with rollator walker. Weight is stable.   Anxiety and nervousness continues. She has panic attacks especially  when she is doing something new or something she doesn't understand. She does not take medication for this and does not want medication.  Has had some loose, urgent stools. This is a change for her since moving to assisted living. Denies hematochezia, melena, nausea, vomiting. Notes no aggravating or relieving factors. Also has constipation at times but refuses stool softener because it will give her diarrhea.    Review of Systems:  Review of Systems  Constitutional: Negative for fever.  HENT: Negative for hearing loss.   Eyes: Negative for blurred vision and double vision.  Respiratory: Negative for cough, sputum production and wheezing.   Cardiovascular: Negative for chest pain, palpitations and leg swelling.  Gastrointestinal: Positive for diarrhea and constipation. Negative for heartburn, nausea, vomiting, abdominal pain, blood in stool and melena.  Genitourinary: Positive for frequency. Negative for hematuria and flank pain.       Incontinent  Musculoskeletal: Positive for falls. Negative for myalgias.  Skin: Negative for rash.  Neurological: Positive for tingling. Negative for dizziness and headaches.  Psychiatric/Behavioral: Positive for memory loss. Negative for depression and suicidal ideas. The patient is nervous/anxious. The patient does not have insomnia.     Past Medical History  Diagnosis Date  . Hypertension   . Anxiety   . Abnormality of gait   . Irritable bowel syndrome 03/11/2010  . Urge urinary incontinence   . Allergic  rhinitis   . COPD (chronic obstructive pulmonary disease) (HCC)   . Diverticulosis of colon   . Peripheral vascular disease (HCC)   . Carotid stenosis, right     asymptomatic  <40%  right  ica  and right eca  . PAD (peripheral artery disease) (HCC) monitored by dr Rubye Oaks    moderate  . Claudication, intermittent (HCC)   . Internal hemorrhoids   . History of esophagitis   . History of hypothyroidism   . Arthritis   . Unspecified  hereditary and idiopathic peripheral neuropathy   . Carpal tunnel syndrome on both sides   . Fracture, calcaneus closed     Past Surgical History  Procedure Laterality Date  . Carotid endarterectomy Left 11-18-2007  . Dilation and curettage of uterus  1973  . Colonoscopy  2005    mild diverticulosis  . Cataract extraction w/ intraocular lens implant Right 11/2012  . Cystoscopy with injection N/A 09/06/2013    Procedure: CYSTOSCOPY WITH BOTOX  INJECTION;  Surgeon: Martina Sinner, MD;  Location: [facility];  Service: Urology;  Laterality: N/A;    No Known Allergies Medications: Patient's Medications  New Prescriptions   No medications on file  Previous Medications   ACETAMINOPHEN (TYLENOL) 325 MG TABLET    Take 325 mg by mouth. Take 2 tablets three times daily for pain relief. Take two tablets every 6 hours as needed for pain   ALUM & MAG HYDROXIDE-SIMETH (MYLANTA) 200-200-20 MG/5ML SUSPENSION    Take 5 mLs by mouth at bedtime. Give 150 mL qhs; take twice daily   AMLODIPINE (NORVASC) 10 MG TABLET    Take one tablet daily for blood pressure   ARNICA 20 % TINC    Apply topically as needed.   ARTIFICIAL TEAR (GENTEAL) GEL    Apply to eye 2 (two) times daily. Apply twice daily to both eyes to help dry eyes    B COMPLEX-C (SUPER B COMPLEX PO)    Take by mouth. with Vit C supplement: take 2 tablets every morning for hair, skin and nails   CLOPIDOGREL (PLAVIX) 75 MG TABLET    Take one tablet daily   DICLOFENAC SODIUM (VOLTAREN) 1 % GEL    Apply 4 g topically 4 (four) times daily. To knees   ESOMEPRAZOLE (NEXIUM) 40 MG CAPSULE    Take 40 mg by mouth 2 (two) times daily. One twice daily to reduce stomach acid    ESZOPICLONE (LUNESTA) 2 MG TABS    Take 1 tablet (2 mg total) by mouth at bedtime. Take immediately before bedtime   HYDROCORTISONE CREAM 1 %    Apply topically. Apply to affected areas twice daily as needed for itch   LORATADINE (CLARITIN) 10 MG TABLET    Take 10 mg  by mouth daily.   MUPIROCIN OINTMENT (BACTROBAN) 2 %    Place 1 application into the nose. Apply thin layer to both nasal oritius as needed for irrtation   MYRBETRIQ 50 MG TB24    Take 2 tablets by mouth daily.    NYSTATIN 1000000 UNITS CAPS    Take by mouth. Apply cream to peri-rectal area after each bowel movement   POLYETHYLENE GLYCOL POWDER (GLYCOLAX/MIRALAX) POWDER    Take 17gram daily as needed   SENNA (SENOKOT) 8.6 MG TABLET    Take 1 tablet by mouth daily. 2 tablet q hs   SODIUM CHLORIDE (OCEAN) 0.65 % SOLN NASAL SPRAY    Place 1 spray into both nostrils 2 (  two) times daily.  Modified Medications   No medications on file  Discontinued Medications   No medications on file    Physical Exam: Filed Vitals:   03/21/15 1041  BP: 142/68  Pulse: 60  Temp: 97.7 F (36.5 C)  TempSrc: Oral  Height: 5\' 5"  (1.651 m)  Weight: 171 lb (77.565 kg)  SpO2: 99%   Fall Risk  03/21/2015 03/13/2014 02/28/2013  Falls in the past year? No No Yes  Number falls in past yr: - - 1  Injury with Fall? - - Yes   Depression screen [facility] 2[facility] 03/21/2015 03/13/2014 02/28/2013  Decreased Interest 0 0 0  Down, Depressed, Hopeless 0 0 0  PHQ - 2 Score 0 0 0     Physical Exam  Constitutional: She appears well-developed and well-nourished.  HENT:  Head: Normocephalic and atraumatic.  Right Ear: External ear normal.  Left Ear: External ear normal.  Mouth/Throat: Oropharynx is clear and moist.  Eyes: EOM are normal. Pupils are equal, round, and reactive to light. Right eye exhibits no discharge. Left eye exhibits no discharge.  Neck: Normal range of motion. Neck supple. No thyromegaly present.  Cardiovascular: Normal rate, regular rhythm and intact distal pulses.   Murmur heard. Compression stockings in place  Pulmonary/Chest: Effort normal and breath sounds normal. She has no wheezes. She has no rales.  Abdominal: Soft. Bowel sounds are normal. She exhibits no distension. There is no tenderness.    Musculoskeletal: She exhibits no edema or tenderness.  Kyphosis, using a rolling walker  Neurological: She is alert. No cranial nerve deficit. Coordination normal.  Skin: Skin is warm and dry. No pallor.  Psychiatric: She has a normal mood and affect. Her behavior is normal. Judgment and thought content normal.  Vitals reviewed.     Assessment/Plan  1. Medicare annual wellness visit, subsequent Completed today. No new findings indicating further workup. She is declining recommended screening for osteoporosis Dexa scan. She also refuses calcium/Vitamin D supplements.   2. Essential hypertension Well controlled with current therapy. Continue amlopidpine 10mg  daily. Continue regular BP checks per assisted living.   3. Hypothyroidism, unspecified hypothyroidism type Last TSH was March 2015. She is having some increased fatigue, so will recheck TSH.   4. Urinary incontinence, unspecified incontinence type Chronic- refractory overactive bladder per Dr. McDiarmid, urologist. She has had botox injection and has failed most medications. She notes a little better on Myrbetriq so will continue Myrbetriq 50mg  2 tabs nightly.   5. PVD (peripheral vascular disease) (HCC) Continue compression stockings and exercise as tolerated.   6. Spastic colon Possibly causing her urgent loose stools. She declines offered guaiac to check for blood in her stool as she would not want to investigate any further due to her advanced age.   7. Other polyneuropathy (HCC) Chronic in her bilateral lower extremities. Worsening. This is interfering with her ability to walk. Will check Vitamin b12 and folate. She had PT in 2010 and was offered to be referred for PT today, however she declined as she didn't feel like it helped her much in 2010. Continue to use walker for all ambulating.  8. Anxiety Mostly related to new situations. She declines medication. She is able to recognize her anxiety and calm herself down.  9.  Carotid stenosis, unspecified laterality Chronic. Not symptomatic. Continue plavix 75mg  daily.  10. Insomnia Controlled with current medication. Continue Lunesta 2mg  at bedtime as needed.     Labs/tests ordered: CBC, BMP, TSH, B12/Folate  Next appt: 4  months for followup with Dr. Renato Gails.   Ocie Bob, RN, BSN Student- Nurse Practitioner- [employer] [facility] [address], [place] [address] Office Phone:  [phone] Office Fax:  [phone]

## 2015-03-22 DIAGNOSIS — D513 Other dietary vitamin B12 deficiency anemia: Secondary | ICD-10-CM | POA: Diagnosis not present

## 2015-03-22 DIAGNOSIS — E0843 Diabetes mellitus due to underlying condition with diabetic autonomic (poly)neuropathy: Secondary | ICD-10-CM | POA: Diagnosis not present

## 2015-03-22 DIAGNOSIS — E031 Congenital hypothyroidism without goiter: Secondary | ICD-10-CM | POA: Diagnosis not present

## 2015-03-29 DIAGNOSIS — R799 Abnormal finding of blood chemistry, unspecified: Secondary | ICD-10-CM | POA: Diagnosis not present

## 2015-04-25 ENCOUNTER — Encounter: Payer: Self-pay | Admitting: Internal Medicine

## 2015-04-25 ENCOUNTER — Non-Acute Institutional Stay: Payer: Medicare Other | Admitting: Internal Medicine

## 2015-04-25 VITALS — BP 148/68 | HR 88 | Temp 98.8°F

## 2015-04-25 DIAGNOSIS — M79662 Pain in left lower leg: Secondary | ICD-10-CM | POA: Diagnosis not present

## 2015-04-25 DIAGNOSIS — R600 Localized edema: Secondary | ICD-10-CM | POA: Diagnosis not present

## 2015-04-25 DIAGNOSIS — R0902 Hypoxemia: Secondary | ICD-10-CM

## 2015-04-25 DIAGNOSIS — R0609 Other forms of dyspnea: Secondary | ICD-10-CM | POA: Diagnosis not present

## 2015-04-25 DIAGNOSIS — I6523 Occlusion and stenosis of bilateral carotid arteries: Secondary | ICD-10-CM

## 2015-04-25 DIAGNOSIS — M25552 Pain in left hip: Secondary | ICD-10-CM | POA: Diagnosis not present

## 2015-04-25 DIAGNOSIS — M170 Bilateral primary osteoarthritis of knee: Secondary | ICD-10-CM | POA: Diagnosis not present

## 2015-04-25 DIAGNOSIS — R0600 Dyspnea, unspecified: Secondary | ICD-10-CM

## 2015-04-25 NOTE — Progress Notes (Signed)
Patient ID: Tara Walters, female   DOB: 1921/10/29, 81 y.o.   MRN: [id number]   Location:  [facility]  Code Status: DNR  Goals of Care:Advanced Directive information Does patient have an advance directive?: Yes, Type of Advance Directive: Healthcare Power of [place];Living will;Out of facility DNR (pink MOST or yellow form), Pre-existing out of facility DNR order (yellow form or pink MOST form): Yellow form placed in chart (order not valid for inpatient use), Does patient want to make changes to advanced directive?: No - Patient declined  Chief Complaint  Patient presents with  . Acute Visit    left hip pain, bilateral knee pain, no injury, did not fall    HPI: Patient is a 81 y.o. white female seen in the [facility] today for an acute visit with a few concerns:  Left hip pain, bilateral knee pain and shortness of breath  She moved rooms.  Arranged a little different than her previous room with rails and things.  Left hip region is painful.   Knees are bothersome, too.  voltaren benefit only a few mins then stops she thinks.  Uses her rollator or a wheelchair in her room.   Goes down for 3 meals a day to dining room.  Appetite good.    She is more short of breath lately.  No cough, no edema she has noted.  Is worse when she walks and O2 91 today when she got to clinic vitals room.  Legs feel tight and notes it's her neuropathy.  Cannot tolerate lyrica or gabapentin.  Takes a while for the right calf to "unkink" after walking.  This pain is in her calves, left moreso.  Review of Systems:  Review of Systems  Constitutional: Positive for malaise/fatigue. Negative for fever and chills.  HENT: Negative for congestion.   Eyes: Negative for blurred vision.  Respiratory: Positive for shortness of breath. Negative for cough, sputum production and wheezing.   Cardiovascular: Positive for leg swelling. Negative for chest pain.       Calf pain  Gastrointestinal: Negative for  abdominal pain.  Genitourinary: Positive for urgency. Negative for dysuria and frequency.       Incontinence at times  Musculoskeletal: Positive for myalgias, back pain, joint pain and neck pain. Negative for falls.  Skin: Negative for rash.  Neurological: Positive for weakness. Negative for dizziness, loss of consciousness and headaches.  Psychiatric/Behavioral: Positive for memory loss.    Past Medical History  Diagnosis Date  . Hypertension   . Anxiety   . Abnormality of gait   . Irritable bowel syndrome 03/11/2010  . Urge urinary incontinence   . Allergic rhinitis   . COPD (chronic obstructive pulmonary disease) (HCC)   . Diverticulosis of colon   . Peripheral vascular disease (HCC)   . Carotid stenosis, right     asymptomatic  <40%  right  ica  and right eca  . PAD (peripheral artery disease) (HCC) monitored by dr Rubye Oaks    moderate  . Claudication, intermittent (HCC)   . Internal hemorrhoids   . History of esophagitis   . History of hypothyroidism   . Arthritis   . Unspecified hereditary and idiopathic peripheral neuropathy   . Carpal tunnel syndrome on both sides   . Fracture, calcaneus closed     Past Surgical History  Procedure Laterality Date  . Carotid endarterectomy Left 11-18-2007  . Dilation and curettage of uterus  1973  . Colonoscopy  2005  mild diverticulosis  . Cataract extraction w/ intraocular lens implant Right 11/2012  . Cystoscopy with injection N/A 09/06/2013    Procedure: CYSTOSCOPY WITH BOTOX  INJECTION;  Surgeon: Martina Sinner, MD;  Location: [facility];  Service: Urology;  Laterality: N/A;    Social History:   reports that she quit smoking about 31 years ago. Her smoking use included Cigarettes. She has a 50 pack-year smoking history. She has never used smokeless tobacco. She reports that she does not drink alcohol or use illicit drugs.  No Known Allergies  Medications: Patient's Medications  New Prescriptions    No medications on file  Previous Medications   ACETAMINOPHEN (TYLENOL) 325 MG TABLET    Take 325 mg by mouth. Take 2 tablets three times daily for pain relief. Take two tablets every 6 hours as needed for pain   ALUM & MAG HYDROXIDE-SIMETH (MYLANTA) 200-200-20 MG/5ML SUSPENSION    Take 5 mLs by mouth at bedtime. Give 150 mL qhs; take twice daily   AMLODIPINE (NORVASC) 10 MG TABLET    Take one tablet daily for blood pressure   ARNICA 20 % TINC    Apply topically as needed.   ARTIFICIAL TEAR (GENTEAL) GEL    Apply to eye 2 (two) times daily. Apply twice daily to both eyes to help dry eyes    B COMPLEX-C (SUPER B COMPLEX PO)    Take by mouth. with Vit C supplement: take 2 tablets every morning for hair, skin and nails   CLOPIDOGREL (PLAVIX) 75 MG TABLET    Take one tablet daily   ESOMEPRAZOLE (NEXIUM) 40 MG CAPSULE    Take 40 mg by mouth 2 (two) times daily. One twice daily to reduce stomach acid    ESZOPICLONE (LUNESTA) 2 MG TABS    Take 1 tablet (2 mg total) by mouth at bedtime. Take immediately before bedtime   HYDROCORTISONE CREAM 1 %    Apply topically. Apply to affected areas twice daily as needed for itch   LORATADINE (CLARITIN) 10 MG TABLET    Take 10 mg by mouth daily.   MUPIROCIN OINTMENT (BACTROBAN) 2 %    Place 1 application into the nose. Apply thin layer to both nasal oritius as needed for irrtation   MYRBETRIQ 50 MG TB24    Take 2 tablets by mouth daily.    NYSTATIN 1000000 UNITS CAPS    Take by mouth. Apply cream to peri-rectal area after each bowel movement   POLYETHYLENE GLYCOL POWDER (GLYCOLAX/MIRALAX) POWDER    Take 17gram daily as needed   SENNA (SENOKOT) 8.6 MG TABLET    Take 1 tablet by mouth daily. 2 tablet q hs   SODIUM CHLORIDE (OCEAN) 0.65 % SOLN NASAL SPRAY    Place 1 spray into both nostrils 2 (two) times daily.  Modified Medications   No medications on file  Discontinued Medications   DICLOFENAC SODIUM (VOLTAREN) 1 % GEL    Apply 4 g topically 4 (four) times daily. To  knees     Physical Exam: Filed Vitals:   04/25/15 1618  BP: 148/68  Pulse: 88  Temp: 98.8 F (37.1 C)  TempSrc: Oral  SpO2: 91%   There is no weight on file to calculate BMI. Physical Exam  Constitutional: She is oriented to person, place, and time. She appears well-developed and well-nourished. No distress.  Cardiovascular: Normal rate, regular rhythm, normal heart sounds and intact distal pulses.   Positive Homan's on left  Pulmonary/Chest: Effort normal  and breath sounds normal. She has no wheezes. She has no rales.  hypoxia  Abdominal: Soft. Bowel sounds are normal.  Musculoskeletal: Normal range of motion. She exhibits tenderness.  Walks slowly with stooped posture using rollator walker; tenderness of left SI joint and trochanteric bursa region  Neurological: She is alert and oriented to person, place, and time. No cranial nerve deficit.  Skin: Skin is warm and dry.  Psychiatric: She has a normal mood and affect.     Labs reviewed: Basic Metabolic Panel:  Recent Labs  47/82/9501/18/16  NA 137  K 4.5  BUN 18  CREATININE 0.8   Liver Function Tests:  Recent Labs  06/12/14  AST 18  ALT 16  ALKPHOS 63   No results for input(s): LIPASE, AMYLASE in the last 8760 hours. No results for input(s): AMMONIA in the last 8760 hours. CBC:  Recent Labs  06/12/14  WBC 4.2  HGB 12.2  HCT 37  PLT 205   Lipid Panel: No results for input(s): CHOL, HDL, LDLCALC, TRIG, CHOLHDL, LDLDIRECT in the last 8760 hours. No results found for: HGBA1C   Patient Care Team: Kermit Baloiffany L Isacc Turney, DO as PCP - General (Geriatric Medicine) [facility] Anonhristina Wert, NP as Nurse Practitioner (Nurse Practitioner)  Assessment/Plan: 1. Left hip pain -seems this came on during her move from one room to another -says she did not lift a finger with the moving process, but is adjusting to things being in different places and grab bars are being installed so she has contorted  herself in new ways -agrees that voltaren is not doing enough for its cost--feels like the benefit is only a couple mins so d/c -start tramadol low dose 25mg  po tid prn pain 6-10--reminded her she must ask for it  -can continue her current tylenol for mild pain -we also discussed PT for stretching/pain relief  2. Calf pain, left -bothersome with associated increased dyspnea on exertion and hypoxia -obtain bilateral LE venous dopplers due to these concerns -breathing seems significantly worse to me, but lungs clear -could she have a chronic dvt and PE?  3. Localized edema -noted in bilateral legs when not present at baseline -does not have chf history and has not changed her diet -again, check dopplers  4. Dyspnea on exertion -noted a little in the past, but worse now and oxygen dropped to 91 today with clear lungs -?PE, ?cardiac  5. Hypoxia -as above, requested nursing to please check sats daily after she walks from her room to the nurses' station  -also vitals daily and report hypoxia to me or NP  6. Primary osteoarthritis of both knees -bilateral knees with known advanced OA, not really responding to voltaren it seems -pt asks if she can get it back if we stop it--yes of course if she discovers it really was helping her -try low dose tramadol to help pain some more -she has not tolerated hydrocodone, percocet well in the past due to sedation and confusion and she notes she was already confused in her new room yesterday  Labs/tests ordered:  No labs Next appt:  3 mos, but sooner if needed based on imaging and POX results  Panzy Bubeck L. Kindra Bickham, D.O. Geriatrics [facility] [address] [place], [place] [address] Cell Phone (Mon-Fri 8am-5pm):  [phone] On Call:  [phone] & follow prompts after 5pm & weekends Office Phone:  [phone] Office Fax:  [phone]

## 2015-04-26 DIAGNOSIS — R52 Pain, unspecified: Secondary | ICD-10-CM | POA: Diagnosis not present

## 2015-07-18 ENCOUNTER — Encounter: Payer: Self-pay | Admitting: Internal Medicine

## 2015-07-18 ENCOUNTER — Non-Acute Institutional Stay: Payer: Medicare Other | Admitting: Internal Medicine

## 2015-07-18 VITALS — BP 130/64 | HR 57 | Temp 97.9°F | Ht 65.0 in | Wt 175.0 lb

## 2015-07-18 DIAGNOSIS — R0902 Hypoxemia: Secondary | ICD-10-CM | POA: Diagnosis not present

## 2015-07-18 DIAGNOSIS — I10 Essential (primary) hypertension: Secondary | ICD-10-CM | POA: Diagnosis not present

## 2015-07-18 DIAGNOSIS — R0609 Other forms of dyspnea: Secondary | ICD-10-CM | POA: Diagnosis not present

## 2015-07-18 DIAGNOSIS — R0600 Dyspnea, unspecified: Secondary | ICD-10-CM

## 2015-07-18 DIAGNOSIS — M170 Bilateral primary osteoarthritis of knee: Secondary | ICD-10-CM

## 2015-07-18 DIAGNOSIS — R5382 Chronic fatigue, unspecified: Secondary | ICD-10-CM | POA: Diagnosis not present

## 2015-07-18 NOTE — Progress Notes (Signed)
Patient ID: Tara Walters, female   DOB: Nov 07, 1921, 82 y.o.   MRN: [id number]   Location:  [facility] Room Number: 620 Place of Service:  Clinic (12) Provider: Vaniah Chambers L. [name], D.O., C.M.D.  Code Status: DNR Goals of Care:  Advanced Directives 07/18/2015  Does patient have an advance directive? Yes  Type of Estate agent of [place];Living will;Out of facility DNR (pink MOST or yellow form)  Does patient want to make changes to advanced directive? -  Copy of advanced directive(s) in chart? Yes  Pre-existing out of facility DNR order (yellow form or pink MOST form) Yellow form placed in chart (order not valid for inpatient use)     Chief Complaint  Patient presents with  . Medical Management of Chronic Issues    medication management blood pressure, thyroid, COPD, anxiety    HPI: Patient is a 82 y.o. female seen today for medical management of chronic diseases.    She is short of breath with even minimal exertion.  Has some mild edema.  No orthopnea/PND.  She did smoke in college.  Reports she did have an echo in the past.  CXR in 2011 with COPD, stable mild cardiomegaly.  POX today normal.  Last time was low 88-91%.  Is not walking like she did.  Has had three nose bleeds since her last visit.  Is on plavix.  None in a few weeks.  Happens on left side.    Agrees to portable echocardiogram.    Past Medical History  Diagnosis Date  . Hypertension   . Anxiety   . Abnormality of gait   . Irritable bowel syndrome 03/11/2010  . Urge urinary incontinence   . Allergic rhinitis   . COPD (chronic obstructive pulmonary disease) (HCC)   . Diverticulosis of colon   . Peripheral vascular disease (HCC)   . Carotid stenosis, right     asymptomatic  <40%  right  ica  and right eca  . PAD (peripheral artery disease) (HCC) monitored by dr Rubye Oaks    moderate  . Claudication, intermittent (HCC)   . Internal hemorrhoids   . History  of esophagitis   . History of hypothyroidism   . Arthritis   . Unspecified hereditary and idiopathic peripheral neuropathy   . Carpal tunnel syndrome on both sides   . Fracture, calcaneus closed     Past Surgical History  Procedure Laterality Date  . Carotid endarterectomy Left 11-18-2007  . Dilation and curettage of uterus  1973  . Colonoscopy  2005    mild diverticulosis  . Cataract extraction w/ intraocular lens implant Right 11/2012  . Cystoscopy with injection N/A 09/06/2013    Procedure: CYSTOSCOPY WITH BOTOX  INJECTION;  Surgeon: Tara Sinner, MD;  Location: [facility];  Service: Urology;  Laterality: N/A;    No Known Allergies    Medication List       This list is accurate as of: 07/18/15 11:13 AM.  Always use your most recent med list.               acetaminophen 325 MG tablet  Commonly known as:  TYLENOL  Take 325 mg by mouth. Take 2 tablets three times daily for pain relief. Take two tablets every 6 hours as needed for pain     amLODipine 10 MG tablet  Commonly known as:  NORVASC  Take one tablet daily for blood pressure     Arnica 20 % Tinc  Apply topically as needed.     clopidogrel 75 MG tablet  Commonly known as:  PLAVIX  Take one tablet daily     esomeprazole 40 MG capsule  Commonly known as:  NEXIUM  Take 40 mg by mouth 2 (two) times daily. One twice daily to reduce stomach acid     eszopiclone 2 MG Tabs tablet  Commonly known as:  LUNESTA  Take 1 tablet (2 mg total) by mouth at bedtime. Take immediately before bedtime     GENTEAL Gel  Apply to eye 2 (two) times daily. Apply twice daily to both eyes to help dry eyes     hydrocortisone cream 1 %  Apply topically. Apply to affected areas twice daily as needed for itch     loratadine 10 MG tablet  Commonly known as:  CLARITIN  Take 10 mg by mouth daily.     mupirocin ointment 2 %  Commonly known as:  BACTROBAN  Place 1 application into the nose. Apply thin layer to  both nasal oritius as needed for irrtation     MYLANTA 200-200-20 MG/5ML suspension  Generic drug:  alum & mag hydroxide-simeth  Take 5 mLs by mouth at bedtime. Give 150 mL qhs; take twice daily     MYRBETRIQ 50 MG Tb24 tablet  Generic drug:  mirabegron ER  Take 2 tablets by mouth daily.     Nystatin 1000000 units Caps  Take by mouth. Apply cream to peri-rectal area after each bowel movement     polyethylene glycol powder powder  Commonly known as:  GLYCOLAX/MIRALAX  Take 17gram daily as needed     senna 8.6 MG tablet  Commonly known as:  SENOKOT  Take 1 tablet by mouth daily. 2 tablet q hs     sodium chloride 0.65 % Soln nasal spray  Commonly known as:  OCEAN  Place 1 spray into both nostrils 2 (two) times daily.     SUPER B COMPLEX PO  Take by mouth. with Vit C supplement: take 2 tablets every morning for hair, skin and nails        Review of Systems:  Review of Systems  Constitutional: Positive for activity change and fatigue. Negative for fever, chills, diaphoresis and appetite change.  HENT: Positive for nosebleeds and postnasal drip.   Eyes: Negative for pain.  Respiratory: Positive for shortness of breath. Negative for chest tightness and wheezing.   Cardiovascular: Negative for chest pain, palpitations and leg swelling.       No orthopnea, no PND  Gastrointestinal: Negative for abdominal pain.  Genitourinary: Negative for dysuria.  Musculoskeletal: Positive for arthralgias and gait problem.  Neurological: Positive for weakness. Negative for dizziness.  Hematological: Negative for adenopathy.  Psychiatric/Behavioral: Positive for confusion.    Health Maintenance  Topic Date Due  . TETANUS/TDAP  01/17/1943  . ZOSTAVAX  01/17/1984  . DEXA SCAN  01/16/1989  . PNA vac Low Risk Adult (2 of 2 - PPSV23) 09/12/2015  . INFLUENZA VACCINE  12/25/2015    Physical Exam: Filed Vitals:   07/18/15 1101  BP: 130/64  Pulse: 57  Temp: 97.9 F (36.6 C)  TempSrc: Oral   Height:  (1.651 m)  Weight: 175 lb (79.379 kg)  SpO2: 94%   Body mass index is 29.12 kg/(m^2). Physical Exam  Constitutional: She is oriented to person, place, and time. She appears well-developed and well-nourished. No distress.  HENT:  Head: Normocephalic and atraumatic.  Cardiovascular: Normal rate, regular rhythm, normal heart sounds  and intact distal pulses.   No murmur heard. Pulmonary/Chest: Effort normal and breath sounds normal. She has no rales.  Abdominal: Soft. Bowel sounds are normal. She exhibits no distension. There is no tenderness.  Musculoskeletal: Normal range of motion.  Weak and using wheelchair to get around majority of the time now  Neurological: She is alert and oriented to person, place, and time. No cranial nerve deficit. She exhibits normal muscle tone.  Skin: Skin is warm and dry.  Psychiatric:  Flat affect    Labs reviewed: Needs labs abstracted for the past year--none are coming up and I know she's had them   Assessment/Plan 1. Dyspnea on exertion -this is worsening over time -decided to order an echo to work this up--pt requested portable rather than going out to an appt -could be cardiac, but could also be pulmonary in etiology--she has smoked in the past  2. Hypoxia -with ambulation especially -had asked staff to provide post-ambulatory sats but I don't have these  -again, workup with echo first and if negative, consider pulmonary referral, but pt not keen on a lot of testing "at my age"  3. Chronic fatigue -suspect these are all connected as tsh has been wnl, she is sleeping with her medication, claritin is probably not helping either, but she c/o nasal symptoms even with it  4. Primary osteoarthritis of both knees -cont to try to be active to keep joints lubricated -tylenol for arthritis pain -voltaren was too expensive  5. Essential hypertension -bp at goal without meds   Labs/tests ordered:  Portable echo Next appt:   09/12/2015   Khing Belcher L. Mariyanna Mucha, D.O. Geriatrics [facility] [address], [place] [address] Cell Phone (Mon-Fri 8am-5pm):  [phone] On Call:  [phone] & follow prompts after 5pm & weekends Office Phone:  [phone] Office Fax:  [phone]

## 2015-08-01 DIAGNOSIS — H35372 Puckering of macula, left eye: Secondary | ICD-10-CM | POA: Diagnosis not present

## 2015-08-01 DIAGNOSIS — H2512 Age-related nuclear cataract, left eye: Secondary | ICD-10-CM | POA: Diagnosis not present

## 2015-08-01 DIAGNOSIS — H52203 Unspecified astigmatism, bilateral: Secondary | ICD-10-CM | POA: Diagnosis not present

## 2015-08-01 DIAGNOSIS — H26491 Other secondary cataract, right eye: Secondary | ICD-10-CM | POA: Diagnosis not present

## 2015-08-15 ENCOUNTER — Encounter: Payer: Self-pay | Admitting: Family

## 2015-08-15 ENCOUNTER — Ambulatory Visit (INDEPENDENT_AMBULATORY_CARE_PROVIDER_SITE_OTHER)
Admission: RE | Admit: 2015-08-15 | Discharge: 2015-08-15 | Disposition: A | Discharge status: Home / Self Care | Payer: Medicare Other | Source: Ambulatory Visit | Attending: Family | Admitting: Family

## 2015-08-15 ENCOUNTER — Ambulatory Visit (HOSPITAL_COMMUNITY)
Admission: RE | Admit: 2015-08-15 | Discharge: 2015-08-15 | Disposition: A | Discharge status: Home / Self Care | Payer: Medicare Other | Source: Ambulatory Visit | Attending: Family | Admitting: Family

## 2015-08-15 DIAGNOSIS — J449 Chronic obstructive pulmonary disease, unspecified: Secondary | ICD-10-CM | POA: Diagnosis not present

## 2015-08-15 DIAGNOSIS — I6523 Occlusion and stenosis of bilateral carotid arteries: Secondary | ICD-10-CM | POA: Diagnosis not present

## 2015-08-15 DIAGNOSIS — I10 Essential (primary) hypertension: Secondary | ICD-10-CM | POA: Diagnosis not present

## 2015-08-15 DIAGNOSIS — F419 Anxiety disorder, unspecified: Secondary | ICD-10-CM | POA: Diagnosis not present

## 2015-08-15 DIAGNOSIS — I739 Peripheral vascular disease, unspecified: Secondary | ICD-10-CM

## 2015-08-22 ENCOUNTER — Ambulatory Visit (INDEPENDENT_AMBULATORY_CARE_PROVIDER_SITE_OTHER): Payer: Medicare Other | Admitting: Family

## 2015-08-22 ENCOUNTER — Encounter: Payer: Self-pay | Admitting: Family

## 2015-08-22 VITALS — BP 164/73 | HR 69 | Temp 97.3°F | Resp 16 | Ht 65.0 in | Wt 160.0 lb

## 2015-08-22 DIAGNOSIS — I6523 Occlusion and stenosis of bilateral carotid arteries: Secondary | ICD-10-CM

## 2015-08-22 DIAGNOSIS — Z9889 Other specified postprocedural states: Secondary | ICD-10-CM

## 2015-08-22 DIAGNOSIS — Z48812 Encounter for surgical aftercare following surgery on the circulatory system: Secondary | ICD-10-CM | POA: Diagnosis not present

## 2015-08-22 DIAGNOSIS — I739 Peripheral vascular disease, unspecified: Secondary | ICD-10-CM | POA: Diagnosis not present

## 2015-08-22 DIAGNOSIS — I6522 Occlusion and stenosis of left carotid artery: Secondary | ICD-10-CM | POA: Diagnosis not present

## 2015-08-22 NOTE — Progress Notes (Signed)
[facility] [place] HISTORY AND PHYSICAL   MRN : [id number]  History of Present Illness:   Tara Walters is a 82 y.o. female patient of Dr. Edilia Bo who is s/p left CEA in 2009 and also is followed for moderate PAD. She returns for routine surveillance. She is a resident of [facility].   Pt denies claudication in legs with walking, she does report neuropathy in her feet, worse with walking. She no longer walks, states this is due to severe neuropathy, pt does not know reason for neuropathy, no known back problems. She has tight feeling in her calves and feet at rest, denies non healing ulcers on legs/feet.  Pt states her blood pressure later in the day is better, higher in the morning and when she is upset.  The patient denies any history of TIA or stroke symptoms. Specifically she denies a history of amaurosis fugax or monocular blindness, unilateral  facial drooping, hemiparesis, or receptive or expressive aphasia.   Pt Diabetic: No Pt smoker: former smoker, quit in the 1970's  Pt meds include: Statin :No, pt states her cholesterol is OK ASA: No Other anticoagulants/antiplatelets: Plavix    Current Outpatient Prescriptions  Medication Sig Dispense Refill  . acetaminophen (TYLENOL) 325 MG tablet Take 325 mg by mouth. Take 2 tablets three times daily for pain relief. Take two tablets every 6 hours as needed for pain    . alum & mag hydroxide-simeth (MYLANTA) 200-200-20 MG/5ML suspension Take 5 mLs by mouth at bedtime. Give 150 mL qhs; take twice daily    . amLODipine (NORVASC) 10 MG tablet Take one tablet daily for blood pressure    . Arnica 20 % TINC Apply topically as needed.    . Artificial Tear (GENTEAL) GEL Apply to eye 2 (two) times daily. Apply twice daily to both eyes to help dry eyes     . B Complex-C (SUPER B COMPLEX PO) Take by mouth. with Vit C supplement: take 2 tablets every morning for hair, skin and nails    .  clopidogrel (PLAVIX) 75 MG tablet Take one tablet daily    . esomeprazole (NEXIUM) 40 MG capsule Take 40 mg by mouth 2 (two) times daily. One twice daily to reduce stomach acid     . eszopiclone (LUNESTA) 2 MG TABS Take 1 tablet (2 mg total) by mouth at bedtime. Take immediately before bedtime    . hydrocortisone cream 1 % Apply topically. Apply to affected areas twice daily as needed for itch    . loratadine (CLARITIN) 10 MG tablet Take 10 mg by mouth daily.    . mupirocin ointment (BACTROBAN) 2 % Place 1 application into the nose. Apply thin layer to both nasal oritius as needed for irrtation    . MYRBETRIQ 50 MG TB24 Take 2 tablets by mouth daily.     Marland Kitchen Nystatin 1000000 UNITS CAPS Take by mouth. Apply cream to peri-rectal area after each bowel movement    . polyethylene glycol powder (GLYCOLAX/MIRALAX) powder Take 17gram daily as needed    . senna (SENOKOT) 8.6 MG tablet Take 1 tablet by mouth daily. 2 tablet q hs    . sodium chloride (OCEAN) 0.65 % SOLN nasal spray Place 1 spray into both nostrils 2 (two) times daily. 88 mL 0   No current facility-administered medications for this visit.    Past Medical History  Diagnosis Date  . Hypertension   . Anxiety   . Abnormality of gait   .  Irritable bowel syndrome 03/11/2010  . Urge urinary incontinence   . Allergic rhinitis   . COPD (chronic obstructive pulmonary disease) (HCC)   . Diverticulosis of colon   . Peripheral vascular disease (HCC)   . Carotid stenosis, right     asymptomatic  <40%  right  ica  and right eca  . PAD (peripheral artery disease) (HCC) monitored by dr Rubye [place]erson    moderate  . Claudication, intermittent (HCC)   . Internal hemorrhoids   . History of esophagitis   . History of hypothyroidism   . Arthritis   . Unspecified hereditary and idiopathic peripheral neuropathy   . Carpal tunnel syndrome on both sides   . Fracture, calcaneus closed     Social History Social History  Substance Use Topics  . Smoking  status: Former Smoker -- 1.00 packs/day for 50 years    Types: Cigarettes    Quit date: 05/27/1983  . Smokeless tobacco: Never Used  . Alcohol Use: No    Family History Family History  Problem Relation Age of Onset  . Heart attack Mother   . Cancer Father     Leukemia  . Diabetes Father     Surgical History Past Surgical History  Procedure Laterality Date  . Carotid endarterectomy Left 11-18-2007  . Dilation and curettage of uterus  1973  . Colonoscopy  2005    mild diverticulosis  . Cataract extraction w/ intraocular lens implant Right 11/2012  . Cystoscopy with injection N/A 09/06/2013    Procedure: CYSTOSCOPY WITH BOTOX  INJECTION;  Surgeon: Martina SinnerScott A MacDiarmid, MD;  [facility];  Service: Urology;  Laterality: N/A;    No Known Allergies  Current Outpatient Prescriptions  Medication Sig Dispense Refill  . acetaminophen (TYLENOL) 325 MG tablet Take 325 mg by mouth. Take 2 tablets three times daily for pain relief. Take two tablets every 6 hours as needed for pain    . alum & mag hydroxide-simeth (MYLANTA) 200-200-20 MG/5ML suspension Take 5 mLs by mouth at bedtime. Give 150 mL qhs; take twice daily    . amLODipine (NORVASC) 10 MG tablet Take one tablet daily for blood pressure    . Arnica 20 % TINC Apply topically as needed.    . Artificial Tear (GENTEAL) GEL Apply to eye 2 (two) times daily. Apply twice daily to both eyes to help dry eyes     . B Complex-C (SUPER B COMPLEX PO) Take by mouth. with Vit C supplement: take 2 tablets every morning for hair, skin and nails    . clopidogrel (PLAVIX) 75 MG tablet Take one tablet daily    . esomeprazole (NEXIUM) 40 MG capsule Take 40 mg by mouth 2 (two) times daily. One twice daily to reduce stomach acid     . eszopiclone (LUNESTA) 2 MG TABS Take 1 tablet (2 mg total) by mouth at bedtime. Take immediately before bedtime    . hydrocortisone cream 1 % Apply topically. Apply to affected areas twice daily as  needed for itch    . loratadine (CLARITIN) 10 MG tablet Take 10 mg by mouth daily.    . mupirocin ointment (BACTROBAN) 2 % Place 1 application into the nose. Apply thin layer to both nasal oritius as needed for irrtation    . MYRBETRIQ 50 MG TB24 Take 2 tablets by mouth daily.     Marland Kitchen. Nystatin 1000000 UNITS CAPS Take by mouth. Apply cream to peri-rectal area after each bowel movement    . polyethylene  glycol powder (GLYCOLAX/MIRALAX) powder Take 17gram daily as needed    . senna (SENOKOT) 8.6 MG tablet Take 1 tablet by mouth daily. 2 tablet q hs    . sodium chloride (OCEAN) 0.65 % SOLN nasal spray Place 1 spray into both nostrils 2 (two) times daily. 88 mL 0   No current facility-administered medications for this visit.     REVIEW OF SYSTEMS: See HPI for pertinent positives and negatives.  Physical Examination Filed Vitals:   08/22/15 1133 08/22/15 1136  BP: 140/73 164/73  Pulse: 73 69  Temp: 97.3 F (36.3 C)   Resp: 16   Height:  (1.651 m)   Weight: 160 lb (72.576 kg)   SpO2: 95%    Body mass index is 26.63 kg/(m^2).  General: WDWN in NAD Gait: seated in wheelchair HENT: WNL Eyes: Pupils equal Pulmonary: normal non-labored breathing , without Rales, rhonchi, wheezing Cardiac: RRR, no detected murmur  Abdomen: soft, NT, no masses Skin: no rashes, no ulcers, no cellulitis.   VASCULAR EXAM  Carotid Bruits Left Right   negative negative   radial pulses are 2+ palpable and = Aorta is not palpable   VASCULAR EXAM: Extremities without ischemic changes  without Gangrene; without open wounds.     LE Pulses LEFT RIGHT   FEMORAL  palpable  palpable    POPLITEAL not palpable  not palpable   POSTERIOR TIBIAL not palpable  not palpable     DORSALIS PEDIS  ANTERIOR TIBIAL not palpable  not palpable     Musculoskeletal: no muscle wasting or atrophy; no edema. Wearing knee high graduated compression hose. Neurologic: A&O X 3; Appropriate Affect;  SENSATION: normal; MOTOR FUNCTION: 5/5 Symmetric in upper extremities, 4/5 in lower extremities, CN 2-12 intact Speech is fluent/normal                Non-Invasive Vascular Imaging (08/15/15):   Carotid Duplex: 1-39% stenosis of the right ICA and no restenosis of the left ICA (CEA site). No significant change compared to exam of 08/09/14.  ABI: Right: 0.61 (0.69, 08/09/14), waveforms: bi and monophasic; Left: 0.69 (0.62)   ASSESSMENT:  SYMANTHA STEEBER is a 82 y.o. female who is s/p left CEA in 2009 and also is followed for moderate PAD.  Pt states sh no longer walks other than a few steps,  this is due to severe neuropathy, pt does not know reason for neuropathy, no known back problems. She has no signs of of ischemia in her feet/legs. She has no hx of stroke or TIA. Today's carotid duplex suggests 1-39% stenosis of the right ICA and no restenosis of the left ICA (CEA site). No significant change compared to exam of 08/09/14.  ABI's remain stable with moderate arterial occlusive disease bilaterally, bi and monophasic waveforms.    PLAN:   Daily seated active leg exercises. Based on today's exam and non-invasive vascular lab results, the patient will follow up in 1 year with the following tests: carotid duplex and ABI's. Return sooner for rest pain or signs of ischemia in feet/legs.  I discussed in depth with the patient the nature of atherosclerosis, and emphasized the importance of maximal medical management including strict control of blood pressure, blood glucose, and lipid levels, obtaining regular exercise, and cessation of smoking.  The patient is aware that without maximal medical management the underlying atherosclerotic disease process will  progress, limiting the benefit of any interventions.  The patient was given information about stroke prevention and what symptoms should  prompt the patient to seek immediate medical care.  The patient was given information about PAD including signs, symptoms, treatment, what symptoms should prompt the patient to seek immediate medical care, and risk reduction measures to take. Thank you for allowing [place] to participate in this patient's care.  Charisse March, RN, MSN, FNP-C [facility] Office: [phone]  Clinic MD: Darrick Penna on call  08/22/2015 11:37 AM

## 2015-08-22 NOTE — Patient Instructions (Signed)
Stroke Prevention Some medical conditions and behaviors are associated with an increased chance of having a stroke. You may prevent a stroke by making healthy choices and managing medical conditions. HOW CAN I REDUCE MY RISK OF HAVING A STROKE?   Stay physically active. Get at least 30 minutes of activity on most or all days.  Do not smoke. It may also be helpful to avoid exposure to secondhand smoke.  Limit alcohol use. Moderate alcohol use is considered to be:  No more than 2 drinks per day for men.  No more than 1 drink per day for nonpregnant women.  Eat healthy foods. This involves:  Eating 5 or more servings of fruits and vegetables a day.  Making dietary changes that address high blood pressure (hypertension), high cholesterol, diabetes, or obesity.  Manage your cholesterol levels.  Making food choices that are high in fiber and low in saturated fat, trans fat, and cholesterol may control cholesterol levels.  Take any prescribed medicines to control cholesterol as directed by your health care provider.  Manage your diabetes.  Controlling your carbohydrate and sugar intake is recommended to manage diabetes.  Take any prescribed medicines to control diabetes as directed by your health care provider.  Control your hypertension.  Making food choices that are low in salt (sodium), saturated fat, trans fat, and cholesterol is recommended to manage hypertension.  Ask your health care provider if you need treatment to lower your blood pressure. Take any prescribed medicines to control hypertension as directed by your health care provider.  If you are 18-39 years of age, have your blood pressure checked every 3-5 years. If you are 40 years of age or older, have your blood pressure checked every year.  Maintain a healthy weight.  Reducing calorie intake and making food choices that are low in sodium, saturated fat, trans fat, and cholesterol are recommended to manage  weight.  Stop drug abuse.  Avoid taking birth control pills.  Talk to your health care provider about the risks of taking birth control pills if you are over 35 years old, smoke, get migraines, or have ever had a blood clot.  Get evaluated for sleep disorders (sleep apnea).  Talk to your health care provider about getting a sleep evaluation if you snore a lot or have excessive sleepiness.  Take medicines only as directed by your health care provider.  For some people, aspirin or blood thinners (anticoagulants) are helpful in reducing the risk of forming abnormal blood clots that can lead to stroke. If you have the irregular heart rhythm of atrial fibrillation, you should be on a blood thinner unless there is a good reason you cannot take them.  Understand all your medicine instructions.  Make sure that other conditions (such as anemia or atherosclerosis) are addressed. SEEK IMMEDIATE MEDICAL CARE IF:   You have sudden weakness or numbness of the face, arm, or leg, especially on one side of the body.  Your face or eyelid droops to one side.  You have sudden confusion.  You have trouble speaking (aphasia) or understanding.  You have sudden trouble seeing in one or both eyes.  You have sudden trouble walking.  You have dizziness.  You have a loss of balance or coordination.  You have a sudden, severe headache with no known cause.  You have new chest pain or an irregular heartbeat. Any of these symptoms may represent a serious problem that is an emergency. Do not wait to see if the symptoms will   go away. Get medical help at once. Call your local emergency services (911 in [place] Do not drive yourself to the hospital.   This information is not intended to replace advice given to you by your health care provider. Make sure you discuss any questions you have with your health care provider.   Document Released: 06/19/2004 Document Revised: 06/02/2014 Document Reviewed:  11/12/2012 Elsevier Interactive Patient Education 2016 Elsevier Inc.    Peripheral Vascular Disease Peripheral vascular disease (PVD) is a disease of the blood vessels that are not part of your heart and brain. A simple term for PVD is poor circulation. In most cases, PVD narrows the blood vessels that carry blood from your heart to the rest of your body. This can result in a decreased supply of blood to your arms, legs, and internal organs, like your stomach or kidneys. However, it most often affects a person's lower legs and feet. There are two types of PVD.  Organic PVD. This is the more common type. It is caused by damage to the structure of blood vessels.  Functional PVD. This is caused by conditions that make blood vessels contract and tighten (spasm). Without treatment, PVD tends to get worse over time. PVD can also lead to acute ischemic limb. This is when an arm or limb suddenly has trouble getting enough blood. This is a medical emergency. CAUSES Each type of PVD has many different causes. The most common cause of PVD is buildup of a fatty material (plaque) inside of your arteries (atherosclerosis). Small amounts of plaque can break off from the walls of the blood vessels and become lodged in a smaller artery. This blocks blood flow and can cause acute ischemic limb. Other common causes of PVD include:  Blood clots that form inside of blood vessels.  Injuries to blood vessels.  Diseases that cause inflammation of blood vessels or cause blood vessel spasms.  Health behaviors and health history that increase your risk of developing PVD. RISK FACTORS  You may have a greater risk of PVD if you:  Have a family history of PVD.  Have certain medical conditions, including:  High cholesterol.  Diabetes.  High blood pressure (hypertension).  Coronary heart disease.  Past problems with blood clots.  Past injury, such as burns or a broken bone. These may have damaged blood  vessels in your limbs.  Buerger disease. This is caused by inflamed blood vessels in your hands and feet.  Some forms of arthritis.  Rare birth defects that affect the arteries in your legs.  Use tobacco.  Do not get enough exercise.  Are obese.  Are age 50 or older. SIGNS AND SYMPTOMS  PVD may cause many different symptoms. Your symptoms depend on what part of your body is not getting enough blood. Some common signs and symptoms include:  Cramps in your lower legs. This may be a symptom of poor leg circulation (claudication).  Pain and weakness in your legs while you are physically active that goes away when you rest (intermittent claudication).  Leg pain when at rest.  Leg numbness, tingling, or weakness.  Coldness in a leg or foot, especially when compared with the other leg.  Skin or hair changes. These can include:  Hair loss.  Shiny skin.  Pale or bluish skin.  Thick toenails.  Inability to get or maintain an erection (erectile dysfunction). People with PVD are more prone to developing ulcers and sores on their toes, feet, or legs. These may take longer than   normal to heal. DIAGNOSIS Your health care provider may diagnose PVD from your signs and symptoms. The health care provider will also do a physical exam. You may have tests to find out what is causing your PVD and determine its severity. Tests may include:  Blood pressure recordings from your arms and legs and measurements of the strength of your pulses (pulse volume recordings).  Imaging studies using sound waves to take pictures of the blood flow through your blood vessels (Doppler ultrasound).  Injecting a dye into your blood vessels before having imaging studies using:  X-rays (angiogram or arteriogram).  Computer-generated X-rays (CT angiogram).  A powerful electromagnetic field and a computer (magnetic resonance angiogram or MRA). TREATMENT Treatment for PVD depends on the cause of your condition  and the severity of your symptoms. It also depends on your age. Underlying causes need to be treated and controlled. These include long-lasting (chronic) conditions, such as diabetes, high cholesterol, and high blood pressure. You may need to first try making lifestyle changes and taking medicines. Surgery may be needed if these do not work. Lifestyle changes may include:  Quitting smoking.  Exercising regularly.  Following a low-fat, low-cholesterol diet. Medicines may include:  Blood thinners to prevent blood clots.  Medicines to improve blood flow.  Medicines to improve your blood cholesterol levels. Surgical procedures may include:  A procedure that uses an inflated balloon to open a blocked artery and improve blood flow (angioplasty).  A procedure to put in a tube (stent) to keep a blocked artery open (stent implant).  Surgery to reroute blood flow around a blocked artery (peripheral bypass surgery).  Surgery to remove dead tissue from an infected wound on the affected limb.  Amputation. This is surgical removal of the affected limb. This may be necessary in cases of acute ischemic limb that are not improved through medical or surgical treatments. HOME CARE INSTRUCTIONS  Take medicines only as directed by your health care provider.  Do not use any tobacco products, including cigarettes, chewing tobacco, or electronic cigarettes. If you need help quitting, ask your health care provider.  Lose weight if you are overweight, and maintain a healthy weight as directed by your health care provider.  Eat a diet that is low in fat and cholesterol. If you need help, ask your health care provider.  Exercise regularly. Ask your health care provider to suggest some good activities for you.  Use compression stockings or other mechanical devices as directed by your health care provider.  Take good care of your feet.  Wear comfortable shoes that fit well.  Check your feet often for  any cuts or sores. SEEK MEDICAL CARE IF:  You have cramps in your legs while walking.  You have leg pain when you are at rest.  You have coldness in a leg or foot.  Your skin changes.  You have erectile dysfunction.  You have cuts or sores on your feet that are not healing. SEEK IMMEDIATE MEDICAL CARE IF:  Your arm or leg turns cold and blue.  Your arms or legs become red, warm, swollen, painful, or numb.  You have chest pain or trouble breathing.  You suddenly have weakness in your face, arm, or leg.  You become very confused or lose the ability to speak.  You suddenly have a very bad headache or lose your vision.   This information is not intended to replace advice given to you by your health care provider. Make sure you discuss any questions   you have with your health care provider.   Document Released: 06/19/2004 Document Revised: 06/02/2014 Document Reviewed: 10/20/2013 Elsevier Interactive Patient Education 2016 Elsevier Inc.  

## 2015-08-24 ENCOUNTER — Encounter: Payer: Self-pay | Admitting: Internal Medicine

## 2015-09-11 ENCOUNTER — Encounter: Payer: Self-pay | Admitting: Internal Medicine

## 2015-09-12 ENCOUNTER — Encounter: Payer: Self-pay | Admitting: Internal Medicine

## 2015-09-12 ENCOUNTER — Non-Acute Institutional Stay: Payer: Medicare Other | Admitting: Internal Medicine

## 2015-09-12 VITALS — BP 140/70 | HR 78 | Temp 97.7°F

## 2015-09-12 DIAGNOSIS — Z23 Encounter for immunization: Secondary | ICD-10-CM | POA: Diagnosis not present

## 2015-09-12 DIAGNOSIS — I739 Peripheral vascular disease, unspecified: Secondary | ICD-10-CM

## 2015-09-12 DIAGNOSIS — I6523 Occlusion and stenosis of bilateral carotid arteries: Secondary | ICD-10-CM

## 2015-09-12 DIAGNOSIS — R0600 Dyspnea, unspecified: Secondary | ICD-10-CM

## 2015-09-12 DIAGNOSIS — M170 Bilateral primary osteoarthritis of knee: Secondary | ICD-10-CM | POA: Diagnosis not present

## 2015-09-12 DIAGNOSIS — R0609 Other forms of dyspnea: Secondary | ICD-10-CM

## 2015-09-12 MED ORDER — TRAMADOL HCL 50 MG PO TABS
25.0000 mg | ORAL_TABLET | Freq: Three times a day (TID) | ORAL | Status: DC | PRN
Start: 2015-09-12 — End: 2015-09-13

## 2015-09-12 NOTE — Progress Notes (Signed)
Patient ID: Tara Walters, female   DOB: 1921/09/01, 82 y.o.   MRN: [id number]   Location:  [facility] [employer] of Service:  Clinic (12)  Provider: Tamana Hatfield L. Renato Gails, D.O., C.M.D.  Code Status: DNR Goals of Care:  Advanced Directives 09/12/2015  Does patient have an advance directive? Yes  Type of Estate agent of [place];Out of facility DNR (pink MOST or yellow form)  Copy of advanced directive(s) in chart? Yes  Pre-existing out of facility DNR order (yellow form or pink MOST form) Yellow form placed in chart (order not valid for inpatient use)     Chief Complaint  Patient presents with  . Medical Management of Chronic Issues    6 mth follow-up    HPI: Patient is a 82 y.o. female seen today for medical management of chronic diseases/6 month f/u.  I've actually seen her in Feb with worsening shortness of breath.  I ordered an echo on her.  It was done portably.  It was unremarkable and suggestive that her decrease in ability to ambulate distances w/o dyspnea was due to her COPD.  She also f/u with NP Nickel at vascular and had carotid duplex and ABI.  She had a CEA in 2009 and has moderate PAD.    Says she needs to have f/u ultrasound of her eye from cataract surgery.  Spends a lot of time reading.  Doesn't go to other activities.    Left knee is sore and makes noises.  She has not been using the tramadol but agrees to try.  Past Medical History  Diagnosis Date  . Hypertension   . Anxiety   . Abnormality of gait   . Irritable bowel syndrome 03/11/2010  . Urge urinary incontinence   . Allergic rhinitis   . COPD (chronic obstructive pulmonary disease) (HCC)   . Diverticulosis of colon   . Peripheral vascular disease (HCC)   . Carotid stenosis, right     asymptomatic  <40%  right  ica  and right eca  . PAD (peripheral artery disease) (HCC) monitored by dr Rubye Oaks    moderate  . Claudication, intermittent (HCC)   . Internal  hemorrhoids   . History of esophagitis   . History of hypothyroidism   . Arthritis   . Unspecified hereditary and idiopathic peripheral neuropathy   . Carpal tunnel syndrome on both sides   . Fracture, calcaneus closed     Past Surgical History  Procedure Laterality Date  . Carotid endarterectomy Left 11-18-2007  . Dilation and curettage of uterus  1973  . Colonoscopy  2005    mild diverticulosis  . Cataract extraction w/ intraocular lens implant Right 11/2012  . Cystoscopy with injection N/A 09/06/2013    Procedure: CYSTOSCOPY WITH BOTOX  INJECTION;  Surgeon: Martina Sinner, MD;  Location: [facility];  Service: Urology;  Laterality: N/A;    No Known Allergies    Medication List       This list is accurate as of: 09/12/15  2:29 PM.  Always use your most recent med list.               acetaminophen 325 MG tablet  Commonly known as:  TYLENOL  Take 325 mg by mouth. Take 2 tablets three times daily for pain relief. Take two tablets every 6 hours as needed for pain     amLODipine 10 MG tablet  Commonly known as:  NORVASC  Take one tablet daily  for blood pressure     clopidogrel 75 MG tablet  Commonly known as:  PLAVIX  Take one tablet daily     esomeprazole 40 MG capsule  Commonly known as:  NEXIUM  Take 40 mg by mouth 2 (two) times daily. One twice daily to reduce stomach acid     eszopiclone 2 MG Tabs tablet  Commonly known as:  LUNESTA  Take 1 tablet (2 mg total) by mouth at bedtime. Take immediately before bedtime     famotidine 20 MG tablet  Commonly known as:  PEPCID     loratadine 10 MG tablet  Commonly known as:  CLARITIN  Take 10 mg by mouth daily.     MYLANTA 200-200-20 MG/5ML suspension  Generic drug:  alum & mag hydroxide-simeth  Take 5 mLs by mouth at bedtime. Give 150 mL qhs; take twice daily     MYRBETRIQ 50 MG Tb24 tablet  Generic drug:  mirabegron ER  Take 2 tablets by mouth daily.     polyethylene glycol powder powder    Commonly known as:  GLYCOLAX/MIRALAX  Take 17gram daily as needed     senna 8.6 MG tablet  Commonly known as:  SENOKOT  Take 1 tablet by mouth daily. 2 tablet q hs     sodium chloride 0.65 % Soln nasal spray  Commonly known as:  OCEAN  Place 1 spray into both nostrils 2 (two) times daily.     SUPER B COMPLEX PO  Take by mouth. with Vit C supplement: take 2 tablets every morning for hair, skin and nails     SYSTANE 0.4-0.3 % Gel ophthalmic gel  Generic drug:  Polyethyl Glycol-Propyl Glycol  Place 1 application into both eyes 4 (four) times daily -  before meals and at bedtime.        Review of Systems:  Review of Systems  Constitutional: Positive for activity change and fatigue. Negative for fever, chills, appetite change and unexpected weight change.  HENT: Negative for congestion.   Respiratory: Positive for shortness of breath. Negative for chest tightness and wheezing.   Cardiovascular: Negative for chest pain, palpitations and leg swelling.  Gastrointestinal: Negative for abdominal distention.  Genitourinary: Negative for dysuria.  Musculoskeletal: Positive for joint swelling, arthralgias and gait problem.  Skin: Positive for pallor.  Neurological: Positive for weakness. Negative for dizziness and light-headedness.  Psychiatric/Behavioral: Negative for behavioral problems. The patient is not nervous/anxious.        Denies depression    Health Maintenance  Topic Date Due  . TETANUS/TDAP  01/17/1941  . ZOSTAVAX  01/17/1984  . DEXA SCAN  01/16/1989  . PNA vac Low Risk Adult (2 of 2 - PPSV23) 09/12/2015  . INFLUENZA VACCINE  12/25/2015    Physical Exam: Filed Vitals:   09/12/15 1353  BP: 140/70  Pulse: 78  Temp: 97.7 F (36.5 C)  TempSrc: Oral  SpO2: 97%   There is no weight on file to calculate BMI. Physical Exam  Labs reviewed: Need to check chart to see if last labs were really in Nov (not abstracted apparently or they'd pull into note)  Procedures  since last visit: Echo portable 07/18/15:  Nl size LV, mild LVH, LVEF 65%, diastolic dysfunction (degree not mentioned), RV size and function nl, LA nl size, RA nl size, septum nl, moderate MR, nl aortic valve, mild TR, PA pressure nl, mild PR, nl aortic root and pericardium  Assessment/Plan 1. Dyspnea on exertion -has some LV dysfunction and moderate mitral  regurg on echo, but I don't think either one explains her dyspnea--suspect more COPD-related -if truly has not had labs since Nov '16, will need to get to complete workup  2. Primary osteoarthritis of both knees -chronic, does not want surgery, agrees to take the tramadol previously ordered to see how it works for her (has had for a couple of mos but never taken b/c it's as needed and no one asked if she needed it, by report) -tylenol not sufficient -if pain meds are not effective or does not tolerate them due to falls or increased confusion, will give her a steroid shot in knee  3. PAD (peripheral artery disease) (HCC) -noted, follows with vascular on this, but not really ambulatory anymore except to meals  4. Need for prophylactic vaccination against Streptococcus pneumoniae (pneumococcus) - Pneumococcal polysaccharide vaccine 23-valent greater than or equal to 2yo subcutaneous/IM was given today  Labs/tests ordered:   Orders Placed This Encounter  Procedures  . Pneumococcal polysaccharide vaccine 23-valent greater than or equal to 2yo subcutaneous/IM    Next appt:  3 mos med mgt  Raudel Bazen L. Yardley Lekas, D.O. Geriatrics [facility] [address] [place], [place] [address] Cell Phone (Mon-Fri 8am-5pm):  [phone] On Call:  [phone] & follow prompts after 5pm & weekends Office Phone:  [phone] Office Fax:  [phone]

## 2015-09-13 ENCOUNTER — Telehealth: Payer: Self-pay

## 2015-09-13 MED ORDER — HYDROCODONE-ACETAMINOPHEN 5-325 MG PO TABS: ORAL_TABLET | ORAL | Status: DC

## 2015-09-13 NOTE — Telephone Encounter (Signed)
Let's try hydrocodone 5/325mg  po q 6 hrs for severe pain

## 2015-09-13 NOTE — Telephone Encounter (Signed)
[facility] nurse called for patient, she took on Tramadol with breakfast and got nausea. Patient doesn't want to take it again, but would like something else for pain. Message to Dr. Renato Gailseed

## 2015-09-13 NOTE — Telephone Encounter (Signed)
Called [facility]-spoke with Beth, will d/c Tramadol and start hydrocodone 5/325 mg one po every 6 hours for severe pain. They need hard copy, will have Dr. Renato Gailseed sign Rx and fax it to them.

## 2015-09-19 ENCOUNTER — Telehealth: Payer: Self-pay | Admitting: *Deleted

## 2015-09-19 NOTE — Telephone Encounter (Signed)
I wrote orders on a phone note and will bring to [facility].

## 2015-09-19 NOTE — Telephone Encounter (Signed)
Patient has not had labwork since 03/2015, how do I handle this? Please advise

## 2015-09-20 DIAGNOSIS — G933 Postviral fatigue syndrome: Secondary | ICD-10-CM | POA: Diagnosis not present

## 2015-09-20 DIAGNOSIS — D508 Other iron deficiency anemias: Secondary | ICD-10-CM | POA: Diagnosis not present

## 2015-09-20 DIAGNOSIS — E031 Congenital hypothyroidism without goiter: Secondary | ICD-10-CM | POA: Diagnosis not present

## 2015-09-20 DIAGNOSIS — D519 Vitamin B12 deficiency anemia, unspecified: Secondary | ICD-10-CM | POA: Diagnosis not present

## 2015-09-20 DIAGNOSIS — Z79899 Other long term (current) drug therapy: Secondary | ICD-10-CM | POA: Diagnosis not present

## 2015-09-20 LAB — TSH: TSH: 3.16 u[IU]/mL (ref <=5.90)

## 2015-09-21 ENCOUNTER — Other Ambulatory Visit: Payer: Self-pay | Admitting: *Deleted

## 2015-09-21 MED ORDER — BUPRENORPHINE 10 MCG/HR TD PTWK: 10.0000 ug | MEDICATED_PATCH | TRANSDERMAL | Status: DC

## 2015-09-27 ENCOUNTER — Encounter: Payer: Self-pay | Admitting: Internal Medicine

## 2015-09-28 LAB — BASIC METABOLIC PANEL
BUN: 22 mg/dL — AB (ref 4–21)
CREATININE: 0.9 mg/dL (ref 0.5–1.1)
Glucose: 99 mg/dL
POTASSIUM: 4.1 mmol/L (ref 3.4–5.3)
SODIUM: 139 mmol/L (ref 137–147)

## 2015-09-28 LAB — HEPATIC FUNCTION PANEL
ALK PHOS: 59 U/L (ref 25–125)
ALT: 13 U/L (ref 7–35)
AST: 14 U/L (ref 13–35)
Bilirubin, Total: 0.4 mg/dL

## 2015-09-28 LAB — CBC AND DIFFERENTIAL
HCT: 36 % (ref 36–46)
Hemoglobin: 11.8 g/dL — AB (ref 12.0–16.0)
PLATELETS: 227 10*3/uL (ref 150–399)
WBC: 7.1 10*3/mL

## 2015-09-30 DIAGNOSIS — M25559 Pain in unspecified hip: Secondary | ICD-10-CM | POA: Diagnosis not present

## 2015-10-01 ENCOUNTER — Non-Acute Institutional Stay: Payer: Medicare Other | Admitting: Adult Health

## 2015-10-01 ENCOUNTER — Ambulatory Visit
Admission: RE | Admit: 2015-10-01 | Discharge: 2015-10-01 | Disposition: A | Discharge status: Home / Self Care | Payer: Medicare Other | Source: Ambulatory Visit | Attending: Internal Medicine | Admitting: Internal Medicine

## 2015-10-01 ENCOUNTER — Other Ambulatory Visit: Payer: Self-pay | Admitting: Internal Medicine

## 2015-10-01 ENCOUNTER — Encounter: Payer: Self-pay | Admitting: Adult Health

## 2015-10-01 DIAGNOSIS — Z79899 Other long term (current) drug therapy: Secondary | ICD-10-CM | POA: Diagnosis not present

## 2015-10-01 DIAGNOSIS — G894 Chronic pain syndrome: Secondary | ICD-10-CM

## 2015-10-01 DIAGNOSIS — W19XXXA Unspecified fall, initial encounter: Secondary | ICD-10-CM | POA: Diagnosis not present

## 2015-10-01 DIAGNOSIS — R531 Weakness: Secondary | ICD-10-CM | POA: Diagnosis not present

## 2015-10-01 DIAGNOSIS — F329 Major depressive disorder, single episode, unspecified: Secondary | ICD-10-CM | POA: Diagnosis not present

## 2015-10-01 DIAGNOSIS — N390 Urinary tract infection, site not specified: Secondary | ICD-10-CM | POA: Diagnosis not present

## 2015-10-01 DIAGNOSIS — F32A Depression, unspecified: Secondary | ICD-10-CM

## 2015-10-01 DIAGNOSIS — R930 Abnormal findings on diagnostic imaging of skull and head, not elsewhere classified: Secondary | ICD-10-CM | POA: Diagnosis not present

## 2015-10-01 DIAGNOSIS — M6281 Muscle weakness (generalized): Secondary | ICD-10-CM | POA: Diagnosis not present

## 2015-10-01 DIAGNOSIS — R319 Hematuria, unspecified: Secondary | ICD-10-CM | POA: Diagnosis not present

## 2015-10-01 DIAGNOSIS — R29810 Facial weakness: Secondary | ICD-10-CM

## 2015-10-01 LAB — BASIC METABOLIC PANEL
BUN: 22 mg/dL — AB (ref 4–21)
Creatinine: 0.7 mg/dL (ref 0.5–1.1)
Glucose: 95 mg/dL
Potassium: 3.8 mmol/L (ref 3.4–5.3)
Sodium: 137 mmol/L (ref 137–147)

## 2015-10-01 LAB — CBC AND DIFFERENTIAL
HCT: 38 % (ref 36–46)
Hemoglobin: 11.8 g/dL — AB (ref 12.0–16.0)
Platelets: 267 10*3/uL (ref 150–399)
WBC: 6.1 10^3/mL

## 2015-10-01 NOTE — Progress Notes (Addendum)
Patient ID: Tara Walters, Tara Walters   DOB: December 05, 1921, 82 y.o.   MRN: [id number]  Location:  [facility] [employer] of Service:  ALF (13) Provider:   Peggye Ley, ANP [facility] [phone]   REED, Elmarie Shiley, DO  Patient Care Team: [name], DO as PCP - General (Geriatric Medicine) [facility] Fletcher Anon, NP as Nurse Practitioner (Nurse Practitioner)  Extended Emergency Contact Information Primary Emergency Contact: Rogelio Seen [address] [place] Home Phone: [phone] Mobile Phone: [phone] Relation: Daughter Secondary Emergency Contact: Reed,Tilden  [place] of [place] Home Phone: [phone] Mobile Phone: [phone] Relation: Relative  Code Status:  DNR Goals of care: Advanced Directive information Advanced Directives 09/12/2015  Does patient have an advance directive? Yes  Type of Estate agent of [place];Out of facility DNR (pink MOST or yellow form)  Copy of advanced directive(s) in chart? Yes  Pre-existing out of facility DNR order (yellow form or pink MOST form) Yellow form placed in chart (order not valid for inpatient use)     Chief Complaint  Patient presents with  . Acute Visit    leaning to the left, weak    HPI:  Pt is a 82 y.o. Tara Walters seen today for an acute visit for increased weakness and assistance with ADL's.  She has a hx of chronic fatigue and chronic pain due to OA in the knees, also has a hx of PAD, and CEA done in 2009.  Staff reports increased weakness over the past week and is now not ambulatory.  They are using a stand up lift for transfers and she is incontinent (chronic issue), so there is concern that she may need skilled care.  The nurse noticed last week that she was leaning to the left in her chair. She denies any dysarthria, one sided weakness, visual changes, dizziness, etc.  She fell on 5/7 and reported increased  hip pain, right and left hip xrays were obtained which were negative for acute fracture, with arthritic changes noted.  She hit her head during the fall yesterday as well without a hematoma or further change in mental status (symptoms started before the fall).  She reports that she feels all over weak and can not stand without help. Her pain the hip is worse on the right. She was started on a butrans patch last week for chronic pain but this was stopped due to increased confusion.  For my visit she is alert and oriented, able to f/c but leans to the left in her chair. Labs were ordered over the weekend without acute findings.   Past Medical History  Diagnosis Date  . Hypertension   . Anxiety   . Abnormality of gait   . Irritable bowel syndrome 03/11/2010  . Urge urinary incontinence   . Allergic rhinitis   . COPD (chronic obstructive pulmonary disease) (HCC)   . Diverticulosis of colon   . Peripheral vascular disease (HCC)   . Carotid stenosis, right     asymptomatic  <40%  right  ica  and right eca  . PAD (peripheral artery disease) (HCC) monitored by dr Rubye Oaks    moderate  . Claudication, intermittent (HCC)   . Internal hemorrhoids   . History of esophagitis   . History of hypothyroidism   . Arthritis   . Unspecified hereditary and idiopathic peripheral neuropathy   . Carpal tunnel syndrome on both sides   .  Fracture, calcaneus closed    Past Surgical History  Procedure Laterality Date  . Carotid endarterectomy Left 11-18-2007  . Dilation and curettage of uterus  1973  . Colonoscopy  2005    mild diverticulosis  . Cataract extraction w/ intraocular lens implant Right 11/2012  . Cystoscopy with injection N/A 09/06/2013    Procedure: CYSTOSCOPY WITH BOTOX  INJECTION;  Surgeon: Martina SinnerScott A MacDiarmid, MD;  [facility];  Service: Urology;  Laterality: N/A;    Allergies  Allergen Reactions  . Tramadol Nausea Only      Medication List       This list  is accurate as of: 10/01/15 12:20 PM.  Always use your most recent med list.               acetaminophen 325 MG tablet  Commonly known as:  TYLENOL  Take 325 mg by mouth. Take 2 tablets three times daily for pain relief. Take two tablets every 6 hours as needed for pain     amLODipine 10 MG tablet  Commonly known as:  NORVASC  Take one tablet daily for blood pressure     clopidogrel 75 MG tablet  Commonly known as:  PLAVIX  Take one tablet daily     esomeprazole 40 MG capsule  Commonly known as:  NEXIUM  Take 40 mg by mouth 2 (two) times daily. One twice daily to reduce stomach acid     eszopiclone 2 MG Tabs tablet  Commonly known as:  LUNESTA  Take 1 tablet (2 mg total) by mouth at bedtime. Take immediately before bedtime     famotidine 20 MG tablet  Commonly known as:  PEPCID     HYDROcodone-acetaminophen 5-325 MG tablet  Commonly known as:  NORCO/VICODIN  Take one tablet p.o every 6 hours for sever pain     loratadine 10 MG tablet  Commonly known as:  CLARITIN  Take 10 mg by mouth daily.     MYLANTA 200-200-20 MG/5ML suspension  Generic drug:  alum & mag hydroxide-simeth  Take 5 mLs by mouth 2 (two) times daily.     MYRBETRIQ 50 MG Tb24 tablet  Generic drug:  mirabegron ER  Take 2 tablets by mouth daily.     polyethylene glycol powder powder  Commonly known as:  GLYCOLAX/MIRALAX  Take 17gram daily as needed     senna 8.6 MG tablet  Commonly known as:  SENOKOT  Take 2 tablets by mouth daily. 2 tablet q hs     sodium chloride 0.65 % Soln nasal spray  Commonly known as:  OCEAN  Place 1 spray into both nostrils 2 (two) times daily.     SUPER B COMPLEX PO  Take by mouth. with Vit C supplement: take 2 tablets every morning for hair, skin and nails     SYSTANE 0.4-0.3 % Gel ophthalmic gel  Generic drug:  Polyethyl Glycol-Propyl Glycol  Place 1 application into both eyes 4 (four) times daily -  before meals and at bedtime.        Review of Systems    Constitutional: Positive for activity change and fatigue. Negative for fever, chills, diaphoresis and appetite change.  HENT: Negative for congestion.   Eyes: Negative for visual disturbance.  Respiratory: Negative for cough and shortness of breath.   Cardiovascular: Negative for chest pain, palpitations and leg swelling.  Gastrointestinal: Negative for nausea, vomiting, abdominal pain, diarrhea, constipation and abdominal distention.  Genitourinary: Negative for dysuria and difficulty urinating.  Musculoskeletal: Positive for arthralgias and gait problem. Negative for myalgias, back pain and joint swelling.  Skin: Negative for wound.  Neurological: Positive for facial asymmetry and weakness. Negative for dizziness, tremors, seizures, syncope, speech difficulty, light-headedness, numbness and headaches.  Psychiatric/Behavioral: Positive for confusion, sleep disturbance and dysphoric mood. Negative for hallucinations, behavioral problems and agitation.    Immunization History  Administered Date(s) Administered  . Influenza-Unspecified 03/09/2013, 03/01/2014, 03/08/2015  . PPD Test 12/04/2011  . Pneumococcal Conjugate-13 09/12/2014  . Pneumococcal Polysaccharide-23 09/12/2015   Pertinent  Health Maintenance Due  Topic Date Due  . DEXA SCAN  01/16/1989  . INFLUENZA VACCINE  12/25/2015  . PNA vac Low Risk Adult  Completed   Fall Risk  09/12/2015 03/21/2015 03/13/2014 02/28/2013  Falls in the past year? No No No Yes  Number falls in past yr: - - - 1  Injury with Fall? - - - Yes   Functional Status Survey:    Filed Vitals:   10/01/15 1147  BP: 138/76  Pulse: 95  Temp: 97.6 F (36.4 C)  Resp: 20  SpO2: 92%   There is no weight on file to calculate BMI. Physical Exam  Constitutional: She is oriented to person, place, and time. No distress.  HENT:  Head: Normocephalic and atraumatic.  Right Ear: External ear normal.  Left Ear: External ear normal.  Nose: Nose normal.   Mouth/Throat: Oropharynx is clear and moist. No oropharyngeal exudate.  Eyes: Conjunctivae and EOM are normal. Pupils are equal, round, and reactive to light. Right eye exhibits no discharge. Left eye exhibits no discharge.  Neck: No JVD present.  Cardiovascular: Normal rate and regular rhythm.   No murmur heard. Pulmonary/Chest: Effort normal and breath sounds normal. No respiratory distress.  Abdominal: Soft. Bowel sounds are normal. She exhibits no distension.  Musculoskeletal: She exhibits tenderness (mild on the right hip and left knee without deformity, swelling or bruising). She exhibits no edema.  Lymphadenopathy:    She has no cervical adenopathy.  Neurological: She is alert and oriented to person, place, and time. She displays normal reflexes. A cranial nerve deficit is present. Coordination abnormal.  Slight ptosis of the left lid, slight flattening of nasolabial fold on the left  Skin: Skin is warm and dry. She is not diaphoretic.  Psychiatric:  flat    Labs reviewed:  Recent Labs  09/28/15  NA 139  K 4.1  BUN 22*  CREATININE 0.9    Recent Labs  09/28/15  AST 14  ALT 13  ALKPHOS 59    Recent Labs  09/28/15  WBC 7.1  HGB 11.8*  HCT 36  PLT 227   Lab Results  Component Value Date   TSH 3.16 09/20/2015   No results found for: HGBA1C No results found for: CHOL, HDL, LDLCALC, LDLDIRECT, TRIG, CHOLHDL  Significant Diagnostic Results in last 30 days:  No results found.  Assessment/Plan 1. Weakness -seems slightly weaker on the left with mild ptosis of the left lid.   -she is out side the window for any interventional treatment and due to her age she is not appropriate for aggressive management -Check CT of the head to rule out CVA, also hit her head during her fall yesterday while on plavix -PT and OT to eval, assess level of care  2. Depression -appears depressed during the exam with a flat affect, but screen was negative -consider Cymbalta once #1  is sorted out  3. Chronic pain -continue prn hydrocodone until acute issues are  sorted out but I suspect her symptoms were unrelated to the butrans so we may be able to start it back to help with pain -her pain is due to osteoarthritis but also appears depressed -would re xray if pain persist in the right hip but appears chronic today with out acute deformity  4. Fall -due to weakness, see above plan -neg xray of both hips, no acute deformity or worsening pain  staff Communication: discussed with staff/resident  Labs/tests ordered: CT of the head and UA C and S

## 2015-10-02 ENCOUNTER — Encounter: Payer: Self-pay | Admitting: Internal Medicine

## 2015-10-02 ENCOUNTER — Non-Acute Institutional Stay (SKILLED_NURSING_FACILITY): Payer: Medicare Other | Admitting: Internal Medicine

## 2015-10-02 DIAGNOSIS — I6529 Occlusion and stenosis of unspecified carotid artery: Secondary | ICD-10-CM

## 2015-10-02 DIAGNOSIS — E039 Hypothyroidism, unspecified: Secondary | ICD-10-CM | POA: Diagnosis not present

## 2015-10-02 DIAGNOSIS — K219 Gastro-esophageal reflux disease without esophagitis: Secondary | ICD-10-CM

## 2015-10-02 DIAGNOSIS — K5900 Constipation, unspecified: Secondary | ICD-10-CM | POA: Diagnosis not present

## 2015-10-02 DIAGNOSIS — R269 Unspecified abnormalities of gait and mobility: Secondary | ICD-10-CM | POA: Diagnosis not present

## 2015-10-02 DIAGNOSIS — R278 Other lack of coordination: Secondary | ICD-10-CM | POA: Diagnosis not present

## 2015-10-02 DIAGNOSIS — I739 Peripheral vascular disease, unspecified: Secondary | ICD-10-CM

## 2015-10-02 DIAGNOSIS — N3941 Urge incontinence: Secondary | ICD-10-CM

## 2015-10-02 DIAGNOSIS — M6281 Muscle weakness (generalized): Secondary | ICD-10-CM | POA: Diagnosis not present

## 2015-10-02 DIAGNOSIS — M25551 Pain in right hip: Secondary | ICD-10-CM | POA: Diagnosis not present

## 2015-10-02 DIAGNOSIS — R410 Disorientation, unspecified: Secondary | ICD-10-CM

## 2015-10-02 DIAGNOSIS — Z9181 History of falling: Secondary | ICD-10-CM | POA: Diagnosis not present

## 2015-10-02 DIAGNOSIS — R2689 Other abnormalities of gait and mobility: Secondary | ICD-10-CM | POA: Diagnosis not present

## 2015-10-02 DIAGNOSIS — R531 Weakness: Secondary | ICD-10-CM | POA: Diagnosis not present

## 2015-10-02 NOTE — Progress Notes (Signed)
Patient ID: Tara Walters, female   DOB: 1922-04-30, 82 y.o.   MRN: [id number]   Provider:  Gwenith Spitz. Renato Gails, D.O., C.M.D. Location:  [facility] Room Number: 620 Place of Service:  SNF (31)  PCP: Bufford Spikes, DO Patient Care Team: [name], DO as PCP - General (Geriatric Medicine) [facility] Fletcher Anon, NP as Nurse Practitioner (Nurse Practitioner)  Extended Emergency Contact Information Primary Emergency Contact: [name] [place] [address] [place] of [place] Home Phone: [phone] Mobile Phone: [phone] Relation: Daughter Secondary Emergency Contact: Anjelita Sheahan,[place]  [place] Home Phone: [phone] Mobile Phone: [phone] Relation: Relative  Code Status: DNR Goals of Care: Advanced Directive information Advanced Directives 10/02/2015  Does patient have an advance directive? Yes  Type of Advance Directive Out of facility DNR (pink MOST or yellow form);Living will;Healthcare Power of Attorney  Copy of advanced directive(s) in chart? Yes  Pre-existing out of facility DNR order (yellow form or pink MOST form) -   Chief Complaint  Patient presents with  . New Admit To SNF    rehab admit    HPI: Patient is a 82 y.o. female seen [facility].  She was sent over yesterday due to concerns about leaning to the left, lethargy, facial droop and ptosis of left eye.  She admits to overall weakness and fatigue today.  She reports having eaten well at breakfast.  She is now continuously incontinent and has been for a few months.  Staff had concerns about erythema of her periarea.  She has chronic arthritis pain and I tried her on tramadol which caused confusion and later used butrans with said confusion, as well.  Over the past several months to a year, she has had more arthritis pain and decreased ability to ambulate and perform ADLs.  She is  aware that this rehab stay may mean a transition to skilled care for her.    Of note, she had a CT brain which was negative for acute stroke--showed some atrophy and chronic small vessel disease like other 89 yos.  Her labs were also not notably revealing--slightly elevated BUN, but otherwise no explanation for her confusion.  Urine results pending.  Last night, I ordered a liter of NS at 100cc/hr after her labs were obtained, and I suspect that is what has perked her up b/c she does not drink well at all and admits to it.  She still leans to the left, but the remaining symptoms seem improved.  She will need some therapy now.  Past Medical History  Diagnosis Date  . Hypertension   . Anxiety   . Abnormality of gait   . Irritable bowel syndrome 03/11/2010  . Urge urinary incontinence   . Allergic rhinitis   . COPD (chronic obstructive pulmonary disease) (HCC)   . Diverticulosis of colon   . Peripheral vascular disease (HCC)   . Carotid stenosis, right     asymptomatic  <40%  right  ica  and right eca  . PAD (peripheral artery disease) (HCC) monitored by dr Rubye Oaks    moderate  . Claudication, intermittent (HCC)   . Internal hemorrhoids   . History of esophagitis   . History of hypothyroidism   . Arthritis   . Unspecified hereditary and idiopathic peripheral neuropathy   . Carpal tunnel syndrome on both sides   . Fracture, calcaneus closed  Past Surgical History  Procedure Laterality Date  . Carotid endarterectomy Left 11-18-2007  . Dilation and curettage of uterus  1973  . Colonoscopy  2005    mild diverticulosis  . Cataract extraction w/ intraocular lens implant Right 11/2012  . Cystoscopy with injection N/A 09/06/2013    Procedure: CYSTOSCOPY WITH BOTOX  INJECTION;  Surgeon: Martina SinnerScott A MacDiarmid, MD;  [facility];  Service: Urology;  Laterality: N/A;    reports that she quit smoking about 32 years ago. Her smoking use included Cigarettes. She has a 50  pack-year smoking history. She has never used smokeless tobacco. She reports that she does not drink alcohol or use illicit drugs. Social History   Social History  . Marital Status: Widowed    Spouse Name: N/A  . Number of Children: N/A  . Years of Education: N/A   Occupational History  . retired Production managerbuyer    Social History Main Topics  . Smoking status: Former Smoker -- 1.00 packs/day for 50 years    Types: Cigarettes    Quit date: 05/27/1983  . Smokeless tobacco: Never Used  . Alcohol Use: No  . Drug Use: No  . Sexual Activity: No   Other Topics Concern  . Not on file   Social History Narrative   Widowed, Retired Production managerbuyer.  Patient lives in  [facility] since [facility],[place] [facility] [place] [facility].     Stop smoking 1985, previously smoked one pack a day for about 50 years   . Minimal  Alcohol history   Patient has  Advanced planning documents: Living Will, DNR, POA   No brothers or sisters. No children, five stepchildren.    Walks with walker                Functional Status Survey: Is the patient deaf or have difficulty hearing?: No Does the patient have difficulty seeing, even when wearing glasses/contacts?: Yes Does the patient have difficulty concentrating, remembering, or making decisions?: No Does the patient have difficulty walking or climbing stairs?: Yes Does the patient have difficulty dressing or bathing?: Yes Does the patient have difficulty doing errands alone such as visiting a doctor's office or shopping?: Yes  Family History  Problem Relation Age of Onset  . Heart attack Mother   . Cancer Father     Leukemia  . Diabetes Father     Health Maintenance  Topic Date Due  . Keerstin BerlinETANUS/TDAP  01/17/1943  . ZOSTAVAX  01/17/1984  . DEXA SCAN  01/16/1989  . INFLUENZA VACCINE  12/25/2015  . PNA vac Low Risk Adult  Completed    Allergies  Allergen Reactions  . Tramadol Nausea Only        Medication List       This list is accurate as of: 10/02/15  5:02 PM.  Always use your most recent med list.               acetaminophen 325 MG tablet  Commonly known as:  TYLENOL  Take 325 mg by mouth. Take 2 tablets three times daily for pain relief. Take two tablets every 6 hours as needed for pain     amLODipine 10 MG tablet  Commonly known as:  NORVASC  Take one tablet daily for blood pressure     clopidogrel 75 MG tablet  Commonly known as:  PLAVIX  Take one tablet daily     esomeprazole 40 MG capsule  Commonly known as:  NEXIUM  Take 40 mg by mouth 2 (two) times daily. One twice daily to reduce stomach acid     eszopiclone 2 MG Tabs tablet  Commonly known as:  LUNESTA  Take 1 tablet (2 mg total) by mouth at bedtime. Take immediately before bedtime     famotidine 20 MG tablet  Commonly known as:  PEPCID     loratadine 10 MG tablet  Commonly known as:  CLARITIN  Take 10 mg by mouth daily.     MYLANTA 200-200-20 MG/5ML suspension  Generic drug:  alum & mag hydroxide-simeth  Take 5 mLs by mouth 2 (two) times daily.     MYRBETRIQ 50 MG Tb24 tablet  Generic drug:  mirabegron ER  Take 2 tablets by mouth daily.     polyethylene glycol powder powder  Commonly known as:  GLYCOLAX/MIRALAX  Take 17gram daily as needed     senna 8.6 MG tablet  Commonly known as:  SENOKOT  Take 2 tablets by mouth daily. 2 tablet q hs     sodium chloride 0.65 % Soln nasal spray  Commonly known as:  OCEAN  Place 1 spray into both nostrils 2 (two) times daily.     SUPER B COMPLEX PO  Take by mouth. with Vit C supplement: take 2 tablets every morning for hair, skin and nails     SYSTANE 0.4-0.3 % Gel ophthalmic gel  Generic drug:  Polyethyl Glycol-Propyl Glycol  Place 1 application into both eyes 4 (four) times daily -  before meals and at bedtime.        Review of Systems  Constitutional: Positive for fever and activity change. Negative for chills and appetite change.        Had fevers yesterday and last night and elevated bp, but resolved  HENT: Negative for hearing loss.   Eyes: Negative for discharge.  Respiratory: Negative for chest tightness and shortness of breath.   Cardiovascular: Negative for chest pain and leg swelling.  Gastrointestinal: Negative for abdominal pain.  Genitourinary: Positive for urgency and frequency. Negative for dysuria, flank pain and decreased urine volume.       Urinary incontinence  Musculoskeletal:       Leans left due to hip pain/scoliosis  Neurological: Positive for weakness. Negative for dizziness, facial asymmetry, light-headedness and headaches.  Hematological: Negative for adenopathy.  Psychiatric/Behavioral: Positive for confusion. Negative for behavioral problems and agitation.       Not today, but yesterday    Filed Vitals:   10/02/15 0953  BP: 146/67  Pulse: 90  Temp: 98.9 F (37.2 C)  TempSrc: Oral  Resp: 20  Weight: 176 lb (79.833 kg)  SpO2: 94%   Body mass index is 29.29 kg/(m^2). Physical Exam  Constitutional: She is oriented to person, place, and time. She appears well-developed and well-nourished.  HENT:  Head: Normocephalic and atraumatic.  Right Ear: External ear normal.  Left Ear: External ear normal.  Nose: Nose normal.  Mouth/Throat: Oropharynx is clear and moist.  Eyes: Conjunctivae are normal. Pupils are equal, round, and reactive to light.  Neck: Normal range of motion. Neck supple.  Cardiovascular: Normal rate, regular rhythm, normal heart sounds and intact distal pulses.   Pulmonary/Chest: Effort normal and breath sounds normal. She has no wheezes. She has no rales.  Abdominal: Soft. Bowel sounds are normal. She exhibits no distension and no mass. There is no tenderness. There is no rebound and no guarding.  Musculoskeletal: She exhibits no edema or  tenderness.  Does lean left  Lymphadenopathy:    She has no cervical adenopathy.  Neurological: She is alert and oriented to person,  place, and time. No cranial nerve deficit. She exhibits normal muscle tone. Coordination normal.  Skin: Skin is warm. There is erythema.  Of periarea, moist (had just had incontinence of urine)  Psychiatric: Her behavior is normal. Judgment and thought content normal.  Somewhat flat affect, answers appropriately    Labs reviewed: Basic Metabolic Panel:  Recent Labs  16/10/96 10/01/15 1959  NA 139 137  K 4.1 3.8  BUN 22* 22*  CREATININE 0.9 0.7   Liver Function Tests:  Recent Labs  09/28/15  AST 14  ALT 13  ALKPHOS 59   No results for input(s): LIPASE, AMYLASE in the last 8760 hours. No results for input(s): AMMONIA in the last 8760 hours. CBC:  Recent Labs  09/28/15 10/01/15 1959  WBC 7.1 6.1  HGB 11.8* 11.8*  HCT 36 38  PLT 227 267   Cardiac Enzymes: No results for input(s): CKTOTAL, CKMB, CKMBINDEX, TROPONINI in the last 8760 hours. BNP: Invalid input(s): POCBNP No results found for: HGBA1C Lab Results  Component Value Date   TSH 3.16 09/20/2015   No results found for: VITAMINB12 No results found for: FOLATE No results found for: IRON, TIBC, FERRITIN  Imaging and Procedures obtained prior to SNF admission: Ct Head Wo Contrast  10/02/2015  CLINICAL DATA:  Weakness for 1 month with lethargy. EXAM: CT HEAD WITHOUT CONTRAST TECHNIQUE: Contiguous axial images were obtained from the base of the skull through the vertex without intravenous contrast. COMPARISON:  CT head 11/11/2008. FINDINGS: No evidence for acute infarction, hemorrhage, mass lesion, or extra-axial fluid. Global atrophy with hydrocephalus ex vacuo. Extensive confluent white matter hypoattenuation favored to represent chronic microvascular ischemic change. Calvarium intact. Vascular calcification. No sinus or mastoid disease. RIGHT cataract extraction. IMPRESSION: Progressive atrophy and white matter disease since 2010. No acute intracranial findings. Electronically Signed   By: Elsie Stain M.D.   On:  10/02/2015 07:41    Assessment/Plan 1. Acute delirium -seems better today unless I was just seeing a good part of the waxing and waning -she feels better than she did, but still not herself -no stroke identified, but does have known partially occluded carotid and some chronic small vessel ischemic disease/white matter changes and atrophy -no severe dehydration or electrolyte abn or anemia to explain symptoms -d/c IV and monitor intake  2. Urge incontinence of urine -await UA c+s results -suspect UTI as nothing else has been a definitive explanation -she has overall been declining and the incontinence seems to be mostly functional at this point due to immobility  3. Carotid stenosis, unspecified laterality -prior left cea -1-39% right ICA stenosis 08/15/15 being monitored -CT negative for acute stroke  4. PVD (peripheral vascular disease) with claudication (HCC) -with ABIs 08/15/15 with right 0.61 and left 0.69 -not very mobile anymore, but mostly due to joint pain  5. Hypothyroidism, unspecified hypothyroidism type -cont current synthroid and monitor  6. Gastroesophageal reflux disease without esophagitis -cont pepcid for this -need to change nexium to protonix with her plavix  7. Constipation, unspecified constipation type -cont miralax, increase hydration  8. Gait disorder -will need PT, OT  Family/ staff Communication: pt's family has said they do not want her sent to the hospital (and this has been patient's wish during our prior discussions, as well)  Labs/tests ordered:  Await UA c+s

## 2015-10-03 DIAGNOSIS — M25551 Pain in right hip: Secondary | ICD-10-CM | POA: Diagnosis not present

## 2015-10-03 DIAGNOSIS — G3184 Mild cognitive impairment, so stated: Secondary | ICD-10-CM | POA: Diagnosis not present

## 2015-10-03 DIAGNOSIS — G6289 Other specified polyneuropathies: Secondary | ICD-10-CM | POA: Diagnosis not present

## 2015-10-03 DIAGNOSIS — M6281 Muscle weakness (generalized): Secondary | ICD-10-CM | POA: Diagnosis not present

## 2015-10-03 DIAGNOSIS — R2681 Unsteadiness on feet: Secondary | ICD-10-CM | POA: Diagnosis not present

## 2015-10-03 DIAGNOSIS — R278 Other lack of coordination: Secondary | ICD-10-CM | POA: Diagnosis not present

## 2015-10-03 DIAGNOSIS — R2689 Other abnormalities of gait and mobility: Secondary | ICD-10-CM | POA: Diagnosis not present

## 2015-10-03 DIAGNOSIS — R1312 Dysphagia, oropharyngeal phase: Secondary | ICD-10-CM | POA: Diagnosis not present

## 2015-10-03 DIAGNOSIS — R29810 Facial weakness: Secondary | ICD-10-CM | POA: Diagnosis not present

## 2015-10-03 DIAGNOSIS — R471 Dysarthria and anarthria: Secondary | ICD-10-CM | POA: Diagnosis not present

## 2015-10-03 DIAGNOSIS — M170 Bilateral primary osteoarthritis of knee: Secondary | ICD-10-CM | POA: Diagnosis not present

## 2015-10-03 DIAGNOSIS — R488 Other symbolic dysfunctions: Secondary | ICD-10-CM | POA: Diagnosis not present

## 2015-10-03 DIAGNOSIS — R531 Weakness: Secondary | ICD-10-CM | POA: Diagnosis not present

## 2015-10-03 DIAGNOSIS — Z9181 History of falling: Secondary | ICD-10-CM | POA: Diagnosis not present

## 2015-10-04 DIAGNOSIS — G3184 Mild cognitive impairment, so stated: Secondary | ICD-10-CM | POA: Diagnosis not present

## 2015-10-04 DIAGNOSIS — G6289 Other specified polyneuropathies: Secondary | ICD-10-CM | POA: Diagnosis not present

## 2015-10-04 DIAGNOSIS — R278 Other lack of coordination: Secondary | ICD-10-CM | POA: Diagnosis not present

## 2015-10-04 DIAGNOSIS — R488 Other symbolic dysfunctions: Secondary | ICD-10-CM | POA: Diagnosis not present

## 2015-10-04 DIAGNOSIS — R471 Dysarthria and anarthria: Secondary | ICD-10-CM | POA: Diagnosis not present

## 2015-10-04 DIAGNOSIS — M6281 Muscle weakness (generalized): Secondary | ICD-10-CM | POA: Diagnosis not present

## 2015-10-05 DIAGNOSIS — G3184 Mild cognitive impairment, so stated: Secondary | ICD-10-CM | POA: Diagnosis not present

## 2015-10-05 DIAGNOSIS — R278 Other lack of coordination: Secondary | ICD-10-CM | POA: Diagnosis not present

## 2015-10-05 DIAGNOSIS — M6281 Muscle weakness (generalized): Secondary | ICD-10-CM | POA: Diagnosis not present

## 2015-10-05 DIAGNOSIS — R471 Dysarthria and anarthria: Secondary | ICD-10-CM | POA: Diagnosis not present

## 2015-10-05 DIAGNOSIS — G6289 Other specified polyneuropathies: Secondary | ICD-10-CM | POA: Diagnosis not present

## 2015-10-05 DIAGNOSIS — R488 Other symbolic dysfunctions: Secondary | ICD-10-CM | POA: Diagnosis not present

## 2015-10-08 DIAGNOSIS — R278 Other lack of coordination: Secondary | ICD-10-CM | POA: Diagnosis not present

## 2015-10-08 DIAGNOSIS — G3184 Mild cognitive impairment, so stated: Secondary | ICD-10-CM | POA: Diagnosis not present

## 2015-10-08 DIAGNOSIS — M6281 Muscle weakness (generalized): Secondary | ICD-10-CM | POA: Diagnosis not present

## 2015-10-08 DIAGNOSIS — R488 Other symbolic dysfunctions: Secondary | ICD-10-CM | POA: Diagnosis not present

## 2015-10-08 DIAGNOSIS — G6289 Other specified polyneuropathies: Secondary | ICD-10-CM | POA: Diagnosis not present

## 2015-10-08 DIAGNOSIS — R471 Dysarthria and anarthria: Secondary | ICD-10-CM | POA: Diagnosis not present

## 2015-10-09 DIAGNOSIS — R471 Dysarthria and anarthria: Secondary | ICD-10-CM | POA: Diagnosis not present

## 2015-10-09 DIAGNOSIS — G6289 Other specified polyneuropathies: Secondary | ICD-10-CM | POA: Diagnosis not present

## 2015-10-09 DIAGNOSIS — M6281 Muscle weakness (generalized): Secondary | ICD-10-CM | POA: Diagnosis not present

## 2015-10-09 DIAGNOSIS — R488 Other symbolic dysfunctions: Secondary | ICD-10-CM | POA: Diagnosis not present

## 2015-10-09 DIAGNOSIS — G3184 Mild cognitive impairment, so stated: Secondary | ICD-10-CM | POA: Diagnosis not present

## 2015-10-09 DIAGNOSIS — R278 Other lack of coordination: Secondary | ICD-10-CM | POA: Diagnosis not present

## 2015-10-10 DIAGNOSIS — G3184 Mild cognitive impairment, so stated: Secondary | ICD-10-CM | POA: Diagnosis not present

## 2015-10-10 DIAGNOSIS — R471 Dysarthria and anarthria: Secondary | ICD-10-CM | POA: Diagnosis not present

## 2015-10-10 DIAGNOSIS — G6289 Other specified polyneuropathies: Secondary | ICD-10-CM | POA: Diagnosis not present

## 2015-10-10 DIAGNOSIS — M6281 Muscle weakness (generalized): Secondary | ICD-10-CM | POA: Diagnosis not present

## 2015-10-10 DIAGNOSIS — R488 Other symbolic dysfunctions: Secondary | ICD-10-CM | POA: Diagnosis not present

## 2015-10-10 DIAGNOSIS — R278 Other lack of coordination: Secondary | ICD-10-CM | POA: Diagnosis not present

## 2015-10-11 DIAGNOSIS — G3184 Mild cognitive impairment, so stated: Secondary | ICD-10-CM | POA: Diagnosis not present

## 2015-10-11 DIAGNOSIS — R488 Other symbolic dysfunctions: Secondary | ICD-10-CM | POA: Diagnosis not present

## 2015-10-11 DIAGNOSIS — R278 Other lack of coordination: Secondary | ICD-10-CM | POA: Diagnosis not present

## 2015-10-11 DIAGNOSIS — G6289 Other specified polyneuropathies: Secondary | ICD-10-CM | POA: Diagnosis not present

## 2015-10-11 DIAGNOSIS — R471 Dysarthria and anarthria: Secondary | ICD-10-CM | POA: Diagnosis not present

## 2015-10-11 DIAGNOSIS — M6281 Muscle weakness (generalized): Secondary | ICD-10-CM | POA: Diagnosis not present

## 2015-10-12 DIAGNOSIS — R488 Other symbolic dysfunctions: Secondary | ICD-10-CM | POA: Diagnosis not present

## 2015-10-12 DIAGNOSIS — R278 Other lack of coordination: Secondary | ICD-10-CM | POA: Diagnosis not present

## 2015-10-12 DIAGNOSIS — M6281 Muscle weakness (generalized): Secondary | ICD-10-CM | POA: Diagnosis not present

## 2015-10-12 DIAGNOSIS — G6289 Other specified polyneuropathies: Secondary | ICD-10-CM | POA: Diagnosis not present

## 2015-10-12 DIAGNOSIS — R471 Dysarthria and anarthria: Secondary | ICD-10-CM | POA: Diagnosis not present

## 2015-10-12 DIAGNOSIS — G3184 Mild cognitive impairment, so stated: Secondary | ICD-10-CM | POA: Diagnosis not present

## 2015-10-13 ENCOUNTER — Observation Stay (HOSPITAL_COMMUNITY): Payer: Medicare Other

## 2015-10-13 ENCOUNTER — Emergency Department (HOSPITAL_COMMUNITY): Payer: Medicare Other

## 2015-10-13 ENCOUNTER — Encounter (HOSPITAL_COMMUNITY): Payer: Self-pay | Admitting: Emergency Medicine

## 2015-10-13 ENCOUNTER — Inpatient Hospital Stay (HOSPITAL_COMMUNITY)
Admission: EM | Admit: 2015-10-13 | Discharge: 2015-10-14 | DRG: 066 | Disposition: A | Discharge status: Skilled Nursing Facility | Payer: Medicare Other | Attending: Family Medicine | Admitting: Family Medicine

## 2015-10-13 DIAGNOSIS — J449 Chronic obstructive pulmonary disease, unspecified: Secondary | ICD-10-CM | POA: Diagnosis not present

## 2015-10-13 DIAGNOSIS — I6789 Other cerebrovascular disease: Secondary | ICD-10-CM | POA: Diagnosis not present

## 2015-10-13 DIAGNOSIS — G5600 Carpal tunnel syndrome, unspecified upper limb: Secondary | ICD-10-CM | POA: Diagnosis present

## 2015-10-13 DIAGNOSIS — Z87891 Personal history of nicotine dependence: Secondary | ICD-10-CM

## 2015-10-13 DIAGNOSIS — Z7902 Long term (current) use of antithrombotics/antiplatelets: Secondary | ICD-10-CM

## 2015-10-13 DIAGNOSIS — H0520 Unspecified exophthalmos: Secondary | ICD-10-CM | POA: Diagnosis not present

## 2015-10-13 DIAGNOSIS — R29810 Facial weakness: Secondary | ICD-10-CM | POA: Diagnosis present

## 2015-10-13 DIAGNOSIS — E785 Hyperlipidemia, unspecified: Secondary | ICD-10-CM | POA: Insufficient documentation

## 2015-10-13 DIAGNOSIS — K5900 Constipation, unspecified: Secondary | ICD-10-CM | POA: Diagnosis not present

## 2015-10-13 DIAGNOSIS — K219 Gastro-esophageal reflux disease without esophagitis: Secondary | ICD-10-CM | POA: Diagnosis present

## 2015-10-13 DIAGNOSIS — R0602 Shortness of breath: Secondary | ICD-10-CM | POA: Diagnosis not present

## 2015-10-13 DIAGNOSIS — I639 Cerebral infarction, unspecified: Secondary | ICD-10-CM | POA: Diagnosis not present

## 2015-10-13 DIAGNOSIS — I739 Peripheral vascular disease, unspecified: Secondary | ICD-10-CM | POA: Diagnosis present

## 2015-10-13 DIAGNOSIS — I10 Essential (primary) hypertension: Secondary | ICD-10-CM | POA: Diagnosis not present

## 2015-10-13 DIAGNOSIS — Z79899 Other long term (current) drug therapy: Secondary | ICD-10-CM

## 2015-10-13 DIAGNOSIS — Z888 Allergy status to other drugs, medicaments and biological substances status: Secondary | ICD-10-CM

## 2015-10-13 DIAGNOSIS — E039 Hypothyroidism, unspecified: Secondary | ICD-10-CM | POA: Diagnosis present

## 2015-10-13 DIAGNOSIS — I63311 Cerebral infarction due to thrombosis of right middle cerebral artery: Secondary | ICD-10-CM | POA: Diagnosis present

## 2015-10-13 DIAGNOSIS — G4700 Insomnia, unspecified: Secondary | ICD-10-CM | POA: Diagnosis present

## 2015-10-13 DIAGNOSIS — G459 Transient cerebral ischemic attack, unspecified: Secondary | ICD-10-CM | POA: Diagnosis not present

## 2015-10-13 DIAGNOSIS — I63511 Cerebral infarction due to unspecified occlusion or stenosis of right middle cerebral artery: Secondary | ICD-10-CM | POA: Diagnosis not present

## 2015-10-13 DIAGNOSIS — Z66 Do not resuscitate: Secondary | ICD-10-CM | POA: Diagnosis present

## 2015-10-13 DIAGNOSIS — F419 Anxiety disorder, unspecified: Secondary | ICD-10-CM | POA: Diagnosis present

## 2015-10-13 DIAGNOSIS — I679 Cerebrovascular disease, unspecified: Secondary | ICD-10-CM | POA: Insufficient documentation

## 2015-10-13 LAB — DIFFERENTIAL
BASOS ABS: 0 10*3/uL (ref 0.0–0.1)
Basophils Relative: 0 %
EOS PCT: 1 %
Eosinophils Absolute: 0.1 10*3/uL (ref 0.0–0.7)
LYMPHS ABS: 0.9 10*3/uL (ref 0.7–4.0)
LYMPHS PCT: 15 %
MONO ABS: 0.4 10*3/uL (ref 0.1–1.0)
MONOS PCT: 6 %
NEUTROS PCT: 78 %
Neutro Abs: 4.7 10*3/uL (ref 1.7–7.7)

## 2015-10-13 LAB — COMPREHENSIVE METABOLIC PANEL
ALBUMIN: 3.7 g/dL (ref 3.5–5.0)
ALK PHOS: 60 U/L (ref 38–126)
ALT: 18 U/L (ref 14–54)
ANION GAP: 10 (ref 5–15)
AST: 16 U/L (ref 15–41)
BILIRUBIN TOTAL: 0.6 mg/dL (ref 0.3–1.2)
BUN: 17 mg/dL (ref 6–20)
CALCIUM: 9.4 mg/dL (ref 8.9–10.3)
CO2: 27 mmol/L (ref 22–32)
Chloride: 102 mmol/L (ref 101–111)
Creatinine, Ser: 0.82 mg/dL (ref 0.44–1.00)
GLUCOSE: 112 mg/dL — AB (ref 65–99)
Potassium: 4 mmol/L (ref 3.5–5.1)
Sodium: 139 mmol/L (ref 135–145)
TOTAL PROTEIN: 6.8 g/dL (ref 6.5–8.1)

## 2015-10-13 LAB — CBC
HCT: 40.3 % (ref 36.0–46.0)
HEMOGLOBIN: 13 g/dL (ref 12.0–15.0)
MCH: 27.5 pg (ref 26.0–34.0)
MCHC: 32.3 g/dL (ref 30.0–36.0)
MCV: 85.4 fL (ref 78.0–100.0)
PLATELETS: 298 10*3/uL (ref 150–400)
RBC: 4.72 MIL/uL (ref 3.87–5.11)
RDW: 14.9 % (ref 11.5–15.5)
WBC: 6.1 10*3/uL (ref 4.0–10.5)

## 2015-10-13 LAB — PROTIME-INR
INR: 1.06 (ref 0.00–1.49)
PROTHROMBIN TIME: 14 s (ref 11.6–15.2)

## 2015-10-13 LAB — I-STAT CHEM 8, ED
BUN: 19 mg/dL (ref 6–20)
CALCIUM ION: 1.17 mmol/L (ref 1.13–1.30)
Chloride: 100 mmol/L — ABNORMAL LOW (ref 101–111)
Creatinine, Ser: 0.8 mg/dL (ref 0.44–1.00)
Glucose, Bld: 107 mg/dL — ABNORMAL HIGH (ref 65–99)
HCT: 44 % (ref 36.0–46.0)
Hemoglobin: 15 g/dL (ref 12.0–15.0)
Potassium: 3.9 mmol/L (ref 3.5–5.1)
SODIUM: 140 mmol/L (ref 135–145)
TCO2: 26 mmol/L (ref 0–100)

## 2015-10-13 LAB — APTT: APTT: 30 s (ref 24–37)

## 2015-10-13 LAB — I-STAT TROPONIN, ED: TROPONIN I, POC: 0.01 ng/mL (ref 0.00–0.08)

## 2015-10-13 MED ORDER — ZOLPIDEM TARTRATE 5 MG PO TABS: 5.0000 mg | ORAL_TABLET | Freq: Every evening | ORAL | Status: DC | PRN

## 2015-10-13 MED ORDER — PANTOPRAZOLE SODIUM 40 MG PO TBEC
40.0000 mg | DELAYED_RELEASE_TABLET | Freq: Every day | ORAL | Status: DC
  Administered 2015-10-13 – 2015-10-14 (×2): 40 mg via ORAL
  Filled 2015-10-13 (×2): qty 1

## 2015-10-13 MED ORDER — POLYETHYLENE GLYCOL 3350 17 G PO PACK: 17.0000 g | PACK | Freq: Every day | ORAL | Status: DC | PRN

## 2015-10-13 MED ORDER — ENOXAPARIN SODIUM 40 MG/0.4ML ~~LOC~~ SOLN
40.0000 mg | SUBCUTANEOUS | Status: DC
  Administered 2015-10-13: 40 mg via SUBCUTANEOUS
  Filled 2015-10-13 (×2): qty 0.4

## 2015-10-13 MED ORDER — CLOPIDOGREL BISULFATE 75 MG PO TABS
75.0000 mg | ORAL_TABLET | Freq: Every day | ORAL | Status: DC
  Administered 2015-10-13 – 2015-10-14 (×2): 75 mg via ORAL
  Filled 2015-10-13 (×2): qty 1

## 2015-10-13 MED ORDER — SODIUM CHLORIDE 0.9 % IV SOLN
INTRAVENOUS | Status: AC
  Administered 2015-10-13: 22:00:00 via INTRAVENOUS

## 2015-10-13 MED ORDER — POLYVINYL ALCOHOL 1.4 % OP SOLN
1.0000 [drp] | Freq: Four times a day (QID) | OPHTHALMIC | Status: DC
  Administered 2015-10-13 – 2015-10-14 (×4): 1 [drp] via OPHTHALMIC
  Filled 2015-10-13: qty 15

## 2015-10-13 MED ORDER — STROKE: EARLY STAGES OF RECOVERY BOOK
Freq: Once | Status: AC
  Administered 2015-10-13: 18:00:00
  Filled 2015-10-13: qty 1

## 2015-10-13 MED ORDER — MIRABEGRON ER 50 MG PO TB24
100.0000 mg | ORAL_TABLET | Freq: Every day | ORAL | Status: DC
  Administered 2015-10-13: 100 mg via ORAL
  Filled 2015-10-13 (×2): qty 2

## 2015-10-13 MED ORDER — SENNOSIDES-DOCUSATE SODIUM 8.6-50 MG PO TABS: 1.0000 | ORAL_TABLET | Freq: Every evening | ORAL | Status: DC | PRN

## 2015-10-13 MED ORDER — SENNOSIDES-DOCUSATE SODIUM 8.6-50 MG PO TABS
2.0000 | ORAL_TABLET | Freq: Every day | ORAL | Status: DC
  Administered 2015-10-13: 2 via ORAL
  Filled 2015-10-13: qty 2

## 2015-10-13 MED ORDER — FAMOTIDINE 20 MG PO TABS: 20.0000 mg | ORAL_TABLET | Freq: Two times a day (BID) | ORAL | Status: DC

## 2015-10-13 NOTE — ED Provider Notes (Signed)
  Face-to-face evaluation   History: Tara Walters is a 82 y.o. female here from her nursing care facility for evaluation of decreased ability to ambulate and left-sided numbness. Numbness is located in her left hand, fingers 2 through 5. She denies recent illnesses.  Physical exam: Alert, elderly female in mild distress. Decreased sensation, fingers 2 through 5 left hand. No facial asymmetry. No dysarthria, aphasia or nystagmus. Normal grip strength, both hands.  Medical screening examination/treatment/procedure(s) were conducted as a shared visit with non-physician practitioner(s) and myself.  I personally evaluated the patient during the encounter  Mancel BaleElliott Blenda Wisecup, MD 10/14/15 [phone]

## 2015-10-13 NOTE — ED Provider Notes (Signed)
CSN: [id number]     Arrival date & time 10/13/15  [id number] History   First MD Initiated Contact with Patient 10/13/15 [phone]     Chief Complaint  Patient presents with  . Numbness     (Consider location/radiation/quality/duration/timing/severity/associated sxs/prior Treatment) HPI Tara Walters is a 82 y.o. female presents to emergency department from skilled nursing facility with complaint of left hand numbness. Patient states she noted that her left when numb around 10 PM last night. Patient states she did not sleep well last night and reports that numbness lasted all through the night and still there this morning. She denies any weakness in her hand. Staff at the nursing home did note the patient was leaning to the left and had some facial drooping to them. Patient denies history of CVA. She has had recent evaluation for increased weakness and leaning to the left by her family doctor at the skilled nursing facility. Her CT at that time was negative. Patient does have known less than 40% right carotid stenosis. Patient denies any pain. No headache or extremity pain.   Past Medical History  Diagnosis Date  . Hypertension   . Anxiety   . Abnormality of gait   . Irritable bowel syndrome 03/11/2010  . Urge urinary incontinence   . Allergic rhinitis   . COPD (chronic obstructive pulmonary disease) (HCC)   . Diverticulosis of colon   . Peripheral vascular disease (HCC)   . Carotid stenosis, right     asymptomatic  <40%  right  ica  and right eca  . PAD (peripheral artery disease) (HCC) monitored by dr Rubye Oaksdickerson    moderate  . Claudication, intermittent (HCC)   . Internal hemorrhoids   . History of esophagitis   . History of hypothyroidism   . Arthritis   . Unspecified hereditary and idiopathic peripheral neuropathy   . Carpal tunnel syndrome on both sides   . Fracture, calcaneus closed    Past Surgical History  Procedure Laterality Date  . Carotid endarterectomy Left 11-18-2007  .  Dilation and curettage of uterus  1973  . Colonoscopy  2005    mild diverticulosis  . Cataract extraction w/ intraocular lens implant Right 11/2012  . Cystoscopy with injection N/A 09/06/2013    Procedure: CYSTOSCOPY WITH BOTOX  INJECTION;  Surgeon: Martina SinnerScott A MacDiarmid, MD;  [facility];  Service: Urology;  Laterality: N/A;   Family History  Problem Relation Age of Onset  . Heart attack Mother   . Cancer Father     Leukemia  . Diabetes Father    Social History  Substance Use Topics  . Smoking status: Former Smoker -- 1.00 packs/day for 50 years    Types: Cigarettes    Quit date: 05/27/1983  . Smokeless tobacco: Never Used  . Alcohol Use: No   OB History    No data available     Review of Systems  Constitutional: Positive for fatigue. Negative for fever and chills.  Respiratory: Negative for cough, chest tightness and shortness of breath.   Cardiovascular: Negative for chest pain, palpitations and leg swelling.  Gastrointestinal: Negative for nausea, vomiting, abdominal pain and diarrhea.  Genitourinary: Negative for dysuria and flank pain.  Musculoskeletal: Negative for myalgias, arthralgias, neck pain and neck stiffness.  Skin: Negative for rash.  Neurological: Positive for weakness and numbness. Negative for dizziness and headaches.  All other systems reviewed and are negative.     Allergies  Tramadol  Home Medications   Prior  to Admission medications   Medication Sig Start Date End Date Taking? Authorizing Provider  acetaminophen (TYLENOL) 325 MG tablet Take 325 mg by mouth. Take 2 tablets three times daily for pain relief. Take two tablets every 6 hours as needed for pain    Historical Provider, MD  alum & mag hydroxide-simeth (MYLANTA) 200-200-20 MG/5ML suspension Take 5 mLs by mouth 2 (two) times daily.  10/01/15   Historical Provider, MD  amLODipine (NORVASC) 10 MG tablet Take one tablet daily for blood pressure 06/15/14   Historical  Provider, MD  B Complex-C (SUPER B COMPLEX PO) Take by mouth. with Vit C supplement: take 2 tablets every morning for hair, skin and nails    Historical Provider, MD  clopidogrel (PLAVIX) 75 MG tablet Take one tablet daily 11/28/13   Historical Provider, MD  esomeprazole (NEXIUM) 40 MG capsule Take 40 mg by mouth 2 (two) times daily. One twice daily to reduce stomach acid     Historical Provider, MD  eszopiclone (LUNESTA) 2 MG TABS Take 1 tablet (2 mg total) by mouth at bedtime. Take immediately before bedtime 12/08/12   Toribio Harbour, NP  famotidine (PEPCID) 20 MG tablet  09/03/15   Historical Provider, MD  loratadine (CLARITIN) 10 MG tablet Take 10 mg by mouth daily.    Historical Provider, MD  MYRBETRIQ 50 MG TB24 Take 2 tablets by mouth daily.  06/15/12   Historical Provider, MD  Polyethyl Glycol-Propyl Glycol (SYSTANE) 0.4-0.3 % GEL ophthalmic gel Place 1 application into both eyes 4 (four) times daily -  before meals and at bedtime.    Historical Provider, MD  polyethylene glycol powder (GLYCOLAX/MIRALAX) powder Take 17gram daily as needed 10/13/13   Historical Provider, MD  senna (SENOKOT) 8.6 MG tablet Take 2 tablets by mouth daily. 2 tablet q hs    Historical Provider, MD  sodium chloride (OCEAN) 0.65 % SOLN nasal spray Place 1 spray into both nostrils 2 (two) times daily. 12/13/14   Tiffany L Reed, DO   BP 155/67 mmHg  Pulse 81  Temp(Src) 98.4 F (36.9 C) (Oral)  Resp 24  SpO2 99% Physical Exam  Constitutional: She is oriented to person, place, and time. She appears well-developed and well-nourished. No distress.  HENT:  Head: Normocephalic.  Eyes: Conjunctivae are normal.  Neck: Neck supple.  Cardiovascular: Normal rate and regular rhythm.   Murmur heard. Pulmonary/Chest: Effort normal and breath sounds normal. No respiratory distress. She has no wheezes. She has no rales.  Abdominal: Soft. Bowel sounds are normal. She exhibits no distension. There is no tenderness. There is no  rebound.  Musculoskeletal: She exhibits no edema.  Neurological: She is alert and oriented to person, place, and time. No cranial nerve deficit. Coordination normal.  Skin: Skin is warm and dry.  Psychiatric: She has a normal mood and affect. Her behavior is normal.  Nursing note and vitals reviewed.   ED Course  Procedures (including critical care time) Labs Review Labs Reviewed  PROTIME-INR  APTT  CBC  DIFFERENTIAL  COMPREHENSIVE METABOLIC PANEL  I-STAT TROPOININ, ED  I-STAT CHEM 8, ED  CBG MONITORING, ED    Imaging Review No results found. I have personally reviewed and evaluated these images and lab results as part of my medical decision-making.   EKG Interpretation   Date/Time:  Saturday Oct 13 2015 08:41:22 EDT Ventricular Rate:  82 PR Interval:  238 QRS Duration: 107 QT Interval:  376 QTC Calculation: 439 R Axis:   70 Text Interpretation:  Sinus rhythm Prolonged PR interval Since last  tracing anterior Q waves resolved Confirmed by [facility]  MD, ELLIOTT [phone])  on 10/13/2015 8:49:13 AM      MDM   Final diagnoses:  Acute CVA (cerebrovascular accident) [facility])   Patient with left hand numbness onset yesterday. Numbness persisted through the night into today. No weakness on exam. She is alert and oriented. She does have some left eye drooping which apparently is not new. Also noticed that she is leaning to the left which is also not new. Discussed with Dr. Effie Shy, will get labs, will do MRI of the brain to rule out acute CVA. Vital signs are normal other than mild hypertension.     12:25 PM Discussed pt with neurology. Recommended admission for obs on telemetry.  I spoke with pt and her daughter on the phone who is her POA, April Reed [phone]) and discussed the plan.  She agrees.     Jaynie Crumble, PA-C 10/14/15 1100  Mancel Bale, MD 10/14/15 1559

## 2015-10-13 NOTE — ED Notes (Signed)
Patient transported to MRI 

## 2015-10-13 NOTE — ED Notes (Signed)
From [facility] via EMS: She noted left hand numbness last night about 2230. This morning staff noted a lean to the left and some left facial droop. She states the left hand numbness has improved, but still does not feel normal.

## 2015-10-13 NOTE — H&P (Signed)
History and Physical    Tara Walters ZOX:[id number] DOB: 07-Dec-1921 DOA: 10/13/2015  PCP: Bufford Spikes, DO  Patient coming from:   Nursing Home - [facility]  Chief Complaint:  left hand numbness  HPI: Tara Walters is a 82 y.o. female with medical history significant for, but not necessarily limited to, COPD, hypothyroidism and peripheral vascular disease. Patient was seen by PCP May 9th regarding  weakness with fall as well as new onset left lean in sitting position. Left proptosis noted on exam . Head CTscan did show any acute changes.  Last night around 10 PM patient noticed that her left hand was numb . She did not sleep well despite taking a sleeping pill . This morning symptoms still present. Her right third, fourth and fifth fingers are numb. This has never happened to her before.   ED Course: Afebrile, hemodynamically stable. MRI of the brain shows punctate areas of acute nonhemorrhagic infarction in the posterior right frontal lobe as well as remote lacunar infarcts of the basal ganglia and brainstem. EKG: Sinus rhythm, Prolonged PR interval of 238  Review of Systems: As per HPI, otherwise 10 point review of systems negative.   Past Medical History  Diagnosis Date  . Hypertension   . Anxiety   . Abnormality of gait   . Irritable bowel syndrome 03/11/2010  . Urge urinary incontinence   . Allergic rhinitis   . COPD (chronic obstructive pulmonary disease) (HCC)   . Diverticulosis of colon   . Peripheral vascular disease (HCC)   . Carotid stenosis, right     asymptomatic  <40%  right  ica  and right eca  . PAD (peripheral artery disease) (HCC) monitored by dr Rubye Oaks    moderate  . Claudication, intermittent (HCC)   . Internal hemorrhoids   . History of esophagitis   . History of hypothyroidism   . Arthritis   . Unspecified hereditary and idiopathic peripheral neuropathy   . Carpal tunnel syndrome on both sides   . Fracture, calcaneus closed     Past  Surgical History  Procedure Laterality Date  . Carotid endarterectomy Left 11-18-2007  . Dilation and curettage of uterus  1973  . Colonoscopy  2005    mild diverticulosis  . Cataract extraction w/ intraocular lens implant Right 11/2012  . Cystoscopy with injection N/A 09/06/2013    Procedure: CYSTOSCOPY WITH BOTOX  INJECTION;  Surgeon: Martina Sinner, MD;  Location: [facility];  Service: Urology;  Laterality: N/A;   Ambulates with a cane.  Lives in assisted living.   reports that she quit smoking about 32 years ago. Her smoking use included Cigarettes. She has a 50 pack-year smoking history. She has never used smokeless tobacco. She reports that she does not drink alcohol or use illicit drugs.  Allergies  Allergen Reactions  . Tramadol Nausea Only    Family History  Problem Relation Age of Onset  . Heart attack Mother   . Cancer Father     Leukemia  . Diabetes Father      Prior to Admission medications   Medication Sig Start Date End Date Taking? Authorizing Provider  acetaminophen (TYLENOL) 325 MG tablet Take 650 mg by mouth See admin instructions. Take 2 tablets (650 mg) by mouth 3 times daily, may also take 2 tablets (650 mg) every 6 hours as needed for pain   Yes Historical Provider, MD  alum & mag hydroxide-simeth (MINTOX) 200-200-20 MG/5ML suspension Take 15 mLs by mouth  2 (two) times daily.   Yes Historical Provider, MD  amLODipine (NORVASC) 10 MG tablet Take 10 mg by mouth daily. for blood pressure 06/15/14  Yes Historical Provider, MD  clopidogrel (PLAVIX) 75 MG tablet Take 75 mg by mouth daily.  11/28/13  Yes Historical Provider, MD  eszopiclone (LUNESTA) 2 MG TABS Take 1 tablet (2 mg total) by mouth at bedtime. Take immediately before bedtime 12/08/12  Yes Claudette T Nils Flack, NP  famotidine (PEPCID) 20 MG tablet Take 20 mg by mouth 2 (two) times daily.  09/03/15  Yes Historical Provider, MD  loratadine (CLARITIN) 10 MG tablet Take 10 mg by mouth daily.   Yes  Historical Provider, MD  mirabegron ER (MYRBETRIQ) 50 MG TB24 tablet Take 100 mg by mouth at bedtime.   Yes Historical Provider, MD  nystatin cream (MYCOSTATIN) Apply 1 application topically 2 (two) times daily. Apply to per area twice daily until erythema resolved 10/07/15  Yes Historical Provider, MD  pantoprazole (PROTONIX) 40 MG tablet Take 40 mg by mouth daily.   Yes Historical Provider, MD  Polyethyl Glycol-Propyl Glycol (SYSTANE) 0.4-0.3 % GEL ophthalmic gel Place 1 application into both eyes 4 (four) times daily.   Yes Historical Provider, MD  polyethylene glycol powder (GLYCOLAX/MIRALAX) powder Take 17 g by mouth daily as needed (constipation). Mix in 8 oz liquid and drink 10/13/13  Yes Historical Provider, MD  senna-docusate (SENNA S) 8.6-50 MG tablet Take 2 tablets by mouth at bedtime.   Yes Historical Provider, MD  sodium chloride (OCEAN) 0.65 % SOLN nasal spray Place 1 spray into both nostrils 2 (two) times daily.   Yes Historical Provider, MD  SUPER B COMPLEX/C PO Take 2 tablets by mouth daily.   Yes Historical Provider, MD    Physical Exam: Filed Vitals:   10/13/15 0844 10/13/15 0900 10/13/15 0930 10/13/15 1207  BP: 155/67 142/55 125/42 156/62  Pulse: 81 79 76 86  Temp: 98.4 F (36.9 C)     TempSrc: Oral     Resp: 24 17 16 16   SpO2: 99% 94% 93% 94%    Constitutional:  Pleasant 82 year old female in NAD, calm, comfortable Filed Vitals:   10/13/15 0844 10/13/15 0900 10/13/15 0930 10/13/15 1207  BP: 155/67 142/55 125/42 156/62  Pulse: 81 79 76 86  Temp: 98.4 F (36.9 C)     TempSrc: Oral     Resp: 24 17 16 16   SpO2: 99% 94% 93% 94%   Eyes: PERRL, lids and conjunctivae normal ENMT: Mucous membranes are moist. Posterior pharynx clear of any exudate or lesions.Normal dentition.  Neck: normal, supple, no masses Respiratory: clear to auscultation bilaterally, no wheezing, no crackles. Normal respiratory effort. No accessory muscle use.  Cardiovascular: Regular rate and  rhythm, no murmurs / rubs / gallops. No extremity edema. 1+ pedal pulses.  Abdomen: no tenderness, no masses palpated. No hepatomegaly. Bowel sounds positive.  Musculoskeletal: no clubbing / cyanosis. No joint deformity upper and lower extremities. Good ROM, no contractures. Normal muscle tone.  Skin: no rashes, lesions, ulcers. No induration Neurologic: CN 2-12 grossly intact. Sensation intact, Strength 5/5 in all extremities except LUE which is 4/5. Left eye ptosis, facial droop not appreciated Psychiatric: Normal judgment and insight. Alert and oriented x 3. Normal mood.   Labs on Admission: I have personally reviewed following labs and imaging studies  CBC:  Recent Labs Lab 10/13/15 0851 10/13/15 0914  WBC 6.1  --   NEUTROABS 4.7  --   HGB 13.0 15.0  HCT  40.3 44.0  MCV 85.4  --   PLT 298  --    Basic Metabolic Panel:  Recent Labs Lab 10/13/15 0851 10/13/15 0914  NA 139 140  K 4.0 3.9  CL 102 100*  CO2 27  --   GLUCOSE 112* 107*  BUN 17 19  CREATININE 0.82 0.80  CALCIUM 9.4  --    GFR: Estimated Creatinine Clearance: 47.8 mL/min (by C-G formula based on Cr of 0.8). Liver Function Tests:  Recent Labs Lab 10/13/15 0851  AST 16  ALT 18  ALKPHOS 60  BILITOT 0.6  PROT 6.8  ALBUMIN 3.7  Radiological Exams on Admission: Mr Brain Wo Contrast  10/13/2015  CLINICAL DATA:  Left hand numbness beginning at 10:30 p.m. yesterday. Left facial droop in leaning to the left notice today. Left hand numbness has improved. EXAM: MRI HEAD WITHOUT CONTRAST TECHNIQUE: Multiplanar, multiecho pulse sequences of the brain and surrounding structures were obtained without intravenous contrast. COMPARISON:  CT head without contrast 10/01/2015. FINDINGS: 2 punctate foci of acute nonhemorrhagic infarction are present in the posterior right frontal lobe along the precentral gyrus. No other areas of acute or subacute infarction are present. No acute hemorrhage or mass lesion is evident. Advanced  atrophy and diffuse white matter disease is present bilaterally. Remote cortical infarcts are present in the right pre frontal gyrus. The ventricles are proportionate to the degree of atrophy. Multiple remote lacunar infarcts are present in the basal ganglia bilaterally. A remote right paramedian pontine infarct is present. White matter changes extend into the central pons. The cerebellum is normal. Flow is present in the major intracranial arteries. A right lens replacement is noted. The globes and orbits are otherwise intact. The paranasal sinuses and mastoid air cells are clear. The skullbase is normal.  Midline sagittal images are unremarkable. IMPRESSION: 1. Two punctate acute nonhemorrhagic infarcts within the posterior right frontal lobe along the precentral gyrus. These may be related to acute motor symptoms. 2. Advanced atrophy and diffuse white matter disease compatible with chronic microvascular ischemia. 3. Remote lacunar infarcts of the basal ganglia and brainstem. Electronically Signed   By: Marin Roberts M.D.   On: 10/13/2015 11:51    EKG: Independently reviewed.   EKG Interpretation  Date/Time:  Saturday Oct 13 2015 08:41:22 EDT Ventricular Rate:  82 PR Interval:  238 QRS Duration: 107 QT Interval:  376 QTC Calculation: 439 R Axis:   70 Text Interpretation:  Sinus rhythm Prolonged PR interval Since last tracing anterior Q waves resolved Confirmed by Effie Shy  MD, Mechele Collin ([id number]) on 10/13/2015 8:49:13 AM Also confirmed by Effie Shy  MD, ELLIOTT [phone]), editor [employer], Cala Bradford [phone])  on 10/13/2015 12:09:03 PM      Echocardiogram February 2017 shows mild left ventricular hypertrophy. Left ventricular EF estimated to be 65%. Abnormal diastolic dysfunction consistent with impaired relaxation.   Carotid Dopplers March 2017 - Right internal carotid artery stenosis present in 40% range, right external carotid artery stenosis present. Left internal artery was patent with history of carotid  endarterectomy.  Assessment/Plan   CVA, acute - on chronic Plavix -admit to Medical bed - telemetry.  -CVA order set utilized.  -EDP has consulted Neurology. Dr. Lavon Paganini to see -MRA head ordered.  -She had outpatient carotid dopplers done late last month - no significant blockage, see above- -Echo not repeated -last one Feb 2017 . See above.  -Follow-up on lipid panel and A1c -PT, OT evaluation -RN swallow screen  PVD, left CEA 2009. See recent carotid doppler  studies above. -On Plavix, presumably for PVD  Constipation, managed with MiraLAX and senna -Continue MiraLAX and senna  Hypertension. Controlled in the emergency department -Hold home blood pressure medications for permissive hypertension given CVA  COPD, per PMH. Patient denies history.   GERD. On PPI and H2 blocker -continue home PPI  Insomnia. On Lunesta at home which is not on formulary here.  -Will substitute with 5 mg of Ambien if needed  DVT prophylaxis:   Lovenox Code Status:   DNR  Family Communication: None  Disposition Plan: * Discharge back to assisted living in 24-48 hours         Consults called: EDP consult Neurology, Dr. Lavon PaganiniNandigam to see\ Admission status:  Observation -  telemetry    Willette ClusterPaula Guenther NP [facility] Pager [phone]  If 7PM-7AM, please contact night-coverage [email or web address] Password [facility]  10/13/2015, 12:35 PM

## 2015-10-13 NOTE — ED Notes (Signed)
Attempted report 

## 2015-10-13 NOTE — Consult Note (Signed)
Referring Physician: Dr Selena Batten    Chief Complaint: Left hand weakness and left facial droop  HPI: Tara Walters is a 82 y.o. female with a history of hypertension, anxiety, gait abnormality, COPD, vascular disease, carotid artery disease, peripheral arterial disease, claudication, and peripheral neuropathy who resides at [facility]. This morning the staff noted that the patient had a left facial droop. The patient was also having some numbness of her left hand - possible carpal tunnel syndrome. She was brought to the emergency department for further evaluation. She has been on Plavix 75 mg daily for a previous carotid endarterectomy. Her medications are given to her by the staff. She denies any history of previous stroke or TIA although she had a CT of the head on 10/02/2015 which was performed weakness and lethargy. It was negative for stroke at that time. The history was obtained from the patient. There are no family members present at this time. Deficits have essentially resolved. NIH 0  Date last known well: 10/13/2015 Time last known well: Unable to determine tPA Given: No: Minimal deficits  Past Medical History  Diagnosis Date  . Hypertension   . Anxiety   . Abnormality of gait   . Irritable bowel syndrome 03/11/2010  . Urge urinary incontinence   . Allergic rhinitis   . COPD (chronic obstructive pulmonary disease) (HCC)   . Diverticulosis of colon   . Peripheral vascular disease (HCC)   . Carotid stenosis, right     asymptomatic  <40%  right  ica  and right eca  . PAD (peripheral artery disease) (HCC) monitored by dr Rubye Oaks    moderate  . Claudication, intermittent (HCC)   . Internal hemorrhoids   . History of esophagitis   . History of hypothyroidism   . Arthritis   . Unspecified hereditary and idiopathic peripheral neuropathy   . Carpal tunnel syndrome on both sides   . Fracture, calcaneus closed     Past Surgical History  Procedure Laterality Date  . Carotid  endarterectomy Left 11-18-2007  . Dilation and curettage of uterus  1973  . Colonoscopy  2005    mild diverticulosis  . Cataract extraction w/ intraocular lens implant Right 11/2012  . Cystoscopy with injection N/A 09/06/2013    Procedure: CYSTOSCOPY WITH BOTOX  INJECTION;  Surgeon: Martina Sinner, MD;  Location: [facility];  Service: Urology;  Laterality: N/A;    Family History  Problem Relation Age of Onset  . Heart attack Mother   . Cancer Father     Leukemia  . Diabetes Father    Social History:  reports that she quit smoking about 32 years ago. Her smoking use included Cigarettes. She has a 50 pack-year smoking history. She has never used smokeless tobacco. She reports that she does not drink alcohol or use illicit drugs.  Allergies:  Allergies  Allergen Reactions  . Tramadol Nausea Only    Medications:  Scheduled: .  stroke: mapping our early stages of recovery book   Does not apply Once  . sodium chloride   Intravenous STAT  . clopidogrel  75 mg Oral Daily  . enoxaparin (LOVENOX) injection  40 mg Subcutaneous Q24H  . mirabegron ER  100 mg Oral QHS  . pantoprazole  40 mg Oral Daily  . polyvinyl alcohol  1 drop Both Eyes QID  . senna-docusate  2 tablet Oral QHS    ROS: History obtained from the patient  General ROS: negative for - chills, fatigue, fever, night  sweats, weight gain or weight loss Psychological ROS: negative for - behavioral disorder, hallucinations, memory difficulties, mood swings or suicidal ideation Ophthalmic ROS: negative for - blurry vision, double vision, eye pain or loss of vision ENT ROS: negative for - epistaxis, nasal discharge, oral lesions, sore throat, tinnitus or vertigo Allergy and Immunology ROS: negative for - hives or itchy/watery eyes Hematological and Lymphatic ROS: negative for - bleeding problems, bruising or swollen lymph nodes Endocrine ROS: negative for - galactorrhea, hair pattern changes,  polydipsia/polyuria or temperature intolerance Respiratory ROS: negative for - cough, hemoptysis, shortness of breath or wheezing Cardiovascular ROS: negative for - chest pain, dyspnea on exertion, edema or irregular heartbeat Gastrointestinal ROS: negative for - abdominal pain, diarrhea, hematemesis, nausea/vomiting or stool incontinence Genito-Urinary ROS: negative for - dysuria, hematuria, incontinence or urinary frequency/urgency Musculoskeletal ROS: negative for - joint swelling or muscular weakness Neurological ROS: as noted in HPI Dermatological ROS: negative for rash and skin lesion changes   Physical Examination: Blood pressure 151/69, pulse 86, temperature 98.1 F (36.7 C), temperature source Oral, resp. rate 15, SpO2 98 %.  General - 82 year old female with a flat affect but in no acute distress Heart - Regular rate and rhythm - somewhat distant heart sounds. Lungs - Clear but decreased anteriorly Abdomen - soft with mild diffuse tenderness Extremities - Distal pulses could not be palpated - no edema Skin - Warm and dry  Mental Status: Alert, oriented, except for the year - thought content appropriate.  Speech fluent without evidence of aphasia.  Able to follow 3 step commands without difficulty. Cranial Nerves: II: Discs not visualized; Visual fields grossly normal, pupils equal, round, reactive to light and accommodation III,IV, VI: ptosis not present, extra-ocular motions intact bilaterally V,VII: smile symmetric, facial light touch sensation normal bilaterally VIII: hearing normal bilaterally IX,X: gag reflex present XI: bilateral shoulder shrug XII: midline tongue extension Motor: Motor strength approximately 3-4/5 throughout - symmetrical Tone and bulk:normal tone throughout; no atrophy noted Sensory: Pinprick and light touch intact throughout, bilaterally Deep Tendon Reflexes: 2+ and symmetric throughout Plantars: Right: downgoing   Left:  downgoing Cerebellar: normal finger-to-nose, normal rapid alternating movements. Gait: Not tested  Laboratory Studies:  Basic Metabolic Panel:  Recent Labs Lab 10/13/15 0851 10/13/15 0914  NA 139 140  K 4.0 3.9  CL 102 100*  CO2 27  --   GLUCOSE 112* 107*  BUN 17 19  CREATININE 0.82 0.80  CALCIUM 9.4  --     Liver Function Tests:  Recent Labs Lab 10/13/15 0851  AST 16  ALT 18  ALKPHOS 60  BILITOT 0.6  PROT 6.8  ALBUMIN 3.7   No results for input(s): LIPASE, AMYLASE in the last 168 hours. No results for input(s): AMMONIA in the last 168 hours.  CBC:  Recent Labs Lab 10/13/15 0851 10/13/15 0914  WBC 6.1  --   NEUTROABS 4.7  --   HGB 13.0 15.0  HCT 40.3 44.0  MCV 85.4  --   PLT 298  --     Cardiac Enzymes: No results for input(s): CKTOTAL, CKMB, CKMBINDEX, TROPONINI in the last 168 hours.  BNP: Invalid input(s): POCBNP  CBG: No results for input(s): GLUCAP in the last 168 hours.  Microbiology: Results for orders placed or performed during the hospital encounter of 11/11/08  Urine culture     Status: None   Collection Time: 11/11/08  8:54 PM  Result Value Ref Range Status   Specimen Description URINE, RANDOM  Final  Special Requests Anxiety  Final   Colony Count 50,000 COLONIES/ML  Final   Culture ESCHERICHIA COLI  Final   Report Status 11/14/2008 FINAL  Final   Organism ID, Bacteria ESCHERICHIA COLI  Final      Susceptibility   Escherichia coli - MIC    AMPICILLIN <=2 Sensitive     CEFAZOLIN <=4 Sensitive     CEFTRIAXONE <=1 Sensitive     CIPROFLOXACIN <=0.25 Sensitive     GENTAMICIN <=1 Sensitive     LEVOFLOXACIN <=0.12 Sensitive     NITROFURANTOIN <=16 Sensitive     TOBRAMYCIN <=1 Sensitive     TRIMETH/SULFA <=20 Sensitive     Coagulation Studies:  Recent Labs  10/13/15 0851  LABPROT 14.0  INR 1.06    Urinalysis: No results for input(s): COLORURINE, LABSPEC, PHURINE, GLUCOSEU, HGBUR, BILIRUBINUR, KETONESUR, PROTEINUR,  UROBILINOGEN, NITRITE, LEUKOCYTESUR in the last 168 hours.  Invalid input(s): APPERANCEUR  Lipid Panel: No results found for: CHOL, TRIG, HDL, CHOLHDL, VLDL, LDLCALC  HgbA1C: No results found for: HGBA1C  Urine Drug Screen:  No results found for: LABOPIA, COCAINSCRNUR, LABBENZ, AMPHETMU, THCU, LABBARB  Alcohol Level: No results for input(s): ETH in the last 168 hours.  Other results: ZOX:WRUEA rhythm rate 82 bpm. Please see the formal cardiology reading for complete details.  Imaging:   Dg Chest 2 View 10/13/2015   No active cardiopulmonary disease.   Mr Brain Wo Contrast 10/13/2015   1. Two punctate acute nonhemorrhagic infarcts within the posterior right frontal lobe along the precentral gyrus. These may be related to acute motor symptoms.  2. Advanced atrophy and diffuse white matter disease compatible with chronic microvascular ischemia.  3. Remote lacunar infarcts of the basal ganglia and brainstem.   MRA Brain   10/13/2015 1. High-grade stenosis of the distal right M1 segment or proximal posterior right M2 segment. 2. Marked attenuation of the anterior superior right M2 branch. 3. Mild narrowing of the proximal right M1 segment, less than 50%. 4. The left MCA bifurcation is intact. 5. Atherosclerotic changes within a hypoplastic left vertebral artery which terminates at the left PICA with a severe stenosis at the origin of the left PICA. 6. 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 7. High-grade stenosis of the proximal left posterior cerebral artery.    Assessment: 82 y.o. female resident at [facility] with no previous history of stroke or TIA who developed left hand numbness and left facial droop this morning. Deficits have since resolved. She has been on Plavix for a history of carotid endarterectomy.  Stroke Risk Factors - hypertension, remote tobacco use, and diffuse vascular disease.  Plan: 1. HgbA1c, fasting lipid panel 2. MRI, MRA  of the brain without  contrast 3. PT consult, OT consult, Speech consult 4. Echocardiogram 5. Carotid dopplers 6. Prophylactic therapy-Antiplatelet med: Continue Plavix - dose 75 mg daily 7. NPO until RN stroke swallow screen 8. Telemetry monitoring 9. Frequent neuro checks  Further assessment and plan to follow per Dr. Lavon Paganini.   Delton See PA-C [facility] Pager [phone] 10/13/2015, 5:25 PM

## 2015-10-13 NOTE — ED Notes (Signed)
Patient transported to X-ray 

## 2015-10-14 ENCOUNTER — Other Ambulatory Visit: Payer: Self-pay | Admitting: Neurology

## 2015-10-14 DIAGNOSIS — Z87891 Personal history of nicotine dependence: Secondary | ICD-10-CM | POA: Diagnosis not present

## 2015-10-14 DIAGNOSIS — K219 Gastro-esophageal reflux disease without esophagitis: Secondary | ICD-10-CM | POA: Diagnosis present

## 2015-10-14 DIAGNOSIS — E039 Hypothyroidism, unspecified: Secondary | ICD-10-CM | POA: Diagnosis present

## 2015-10-14 DIAGNOSIS — G4700 Insomnia, unspecified: Secondary | ICD-10-CM | POA: Diagnosis not present

## 2015-10-14 DIAGNOSIS — Z66 Do not resuscitate: Secondary | ICD-10-CM | POA: Diagnosis present

## 2015-10-14 DIAGNOSIS — Z7902 Long term (current) use of antithrombotics/antiplatelets: Secondary | ICD-10-CM | POA: Diagnosis not present

## 2015-10-14 DIAGNOSIS — I6789 Other cerebrovascular disease: Secondary | ICD-10-CM | POA: Diagnosis not present

## 2015-10-14 DIAGNOSIS — K5900 Constipation, unspecified: Secondary | ICD-10-CM | POA: Diagnosis present

## 2015-10-14 DIAGNOSIS — J449 Chronic obstructive pulmonary disease, unspecified: Secondary | ICD-10-CM | POA: Diagnosis present

## 2015-10-14 DIAGNOSIS — H0520 Unspecified exophthalmos: Secondary | ICD-10-CM | POA: Diagnosis present

## 2015-10-14 DIAGNOSIS — E785 Hyperlipidemia, unspecified: Secondary | ICD-10-CM | POA: Insufficient documentation

## 2015-10-14 DIAGNOSIS — I639 Cerebral infarction, unspecified: Secondary | ICD-10-CM | POA: Diagnosis not present

## 2015-10-14 DIAGNOSIS — Z79899 Other long term (current) drug therapy: Secondary | ICD-10-CM | POA: Diagnosis not present

## 2015-10-14 DIAGNOSIS — G5600 Carpal tunnel syndrome, unspecified upper limb: Secondary | ICD-10-CM | POA: Diagnosis present

## 2015-10-14 DIAGNOSIS — F419 Anxiety disorder, unspecified: Secondary | ICD-10-CM | POA: Diagnosis present

## 2015-10-14 DIAGNOSIS — Z888 Allergy status to other drugs, medicaments and biological substances status: Secondary | ICD-10-CM | POA: Diagnosis not present

## 2015-10-14 DIAGNOSIS — R29810 Facial weakness: Secondary | ICD-10-CM | POA: Diagnosis present

## 2015-10-14 DIAGNOSIS — I679 Cerebrovascular disease, unspecified: Secondary | ICD-10-CM | POA: Insufficient documentation

## 2015-10-14 DIAGNOSIS — I739 Peripheral vascular disease, unspecified: Secondary | ICD-10-CM | POA: Diagnosis present

## 2015-10-14 DIAGNOSIS — I10 Essential (primary) hypertension: Secondary | ICD-10-CM | POA: Diagnosis not present

## 2015-10-14 LAB — LIPID PANEL
Cholesterol: 181 mg/dL (ref 0–200)
HDL: 57 mg/dL (ref >40)
LDL CALC: 108 mg/dL — AB (ref 0–99)
Total CHOL/HDL Ratio: 3.2 RATIO
Triglycerides: 79 mg/dL (ref <150)
VLDL: 16 mg/dL (ref 0–40)

## 2015-10-14 MED ORDER — ASPIRIN EC 81 MG PO TBEC: 81.0000 mg | DELAYED_RELEASE_TABLET | Freq: Every day | ORAL | Status: DC

## 2015-10-14 MED ORDER — ATORVASTATIN CALCIUM 10 MG PO TABS: 20.0000 mg | ORAL_TABLET | Freq: Every day | ORAL | Status: DC

## 2015-10-14 MED ORDER — ATORVASTATIN CALCIUM 20 MG PO TABS: 20.0000 mg | ORAL_TABLET | Freq: Every day | ORAL | Status: DC

## 2015-10-14 NOTE — Progress Notes (Signed)
STROKE TEAM PROGRESS NOTE   HISTORY OF PRESENT ILLNESS (per record) Tara Walters is a 82 y.o. female with a history of hypertension, anxiety, gait abnormality, COPD, vascular disease, carotid artery disease, peripheral arterial disease, claudication, and peripheral neuropathy who resides at [facility]. This morning the staff noted that the patient had a left facial droop. The patient was also having some numbness of her left hand - (possible carpal tunnel syndrome). She was brought to the emergency department for further evaluation. She has been on Plavix 75 mg daily for a previous carotid endarterectomy. Her medications are given to her by the staff. She denies any history of previous stroke or TIA although she had a CT of the head on 10/02/2015 which was performed for evaluation of weakness and lethargy. It was negative for stroke at that time. The history was obtained from the patient. There are no family members present. Her deficits have essentially resolved. NIH 0  Date last known well: 10/13/2015 Time last known well: Unable to determine tPA Given: No: Minimal deficits   SUBJECTIVE (INTERVAL HISTORY) Her RN is at the bedside.  Overall she feels her condition is completely resolved. She has no complains and just asking for go back to [facility].    OBJECTIVE Temp:  [97.5 F (36.4 C)-98.9 F (37.2 C)] 98.9 F (37.2 C) (05/21 0519) Pulse Rate:  [74-89] 89 (05/21 0519) Cardiac Rhythm:  [-] Heart block (05/20 1900) Resp:  [13-24] 16 (05/21 0519) BP: (115-160)/(42-94) 151/57 mmHg (05/21 0519) SpO2:  [91 %-99 %] 93 % (05/21 0519) Weight:  [74.6 kg (164 lb 7.4 oz)] 74.6 kg (164 lb 7.4 oz) (05/21 0519)  CBC:  Recent Labs Lab 10/13/15 0851 10/13/15 0914  WBC 6.1  --   NEUTROABS 4.7  --   HGB 13.0 15.0  HCT 40.3 44.0  MCV 85.4  --   PLT 298  --     Basic Metabolic Panel:  Recent Labs Lab 10/13/15 0851 10/13/15 0914  NA 139 140  K 4.0 3.9  CL 102 100*  CO2 27  --    GLUCOSE 112* 107*  BUN 17 19  CREATININE 0.82 0.80  CALCIUM 9.4  --     Lipid Panel:    Component Value Date/Time   CHOL 181 10/14/2015 0620   TRIG 79 10/14/2015 0620   HDL 57 10/14/2015 0620   CHOLHDL 3.2 10/14/2015 0620   VLDL 16 10/14/2015 0620   LDLCALC 108* 10/14/2015 0620   HgbA1c: No results found for: HGBA1C Urine Drug Screen: No results found for: LABOPIA, COCAINSCRNUR, LABBENZ, AMPHETMU, THCU, LABBARB    IMAGING  Dg Chest 2 View 10/13/2015   No active cardiopulmonary disease.   Mr Tara GlennMra Head Wo Contrast 10/13/2015   1. High-grade stenosis of the distal right M1 segment or proximal posterior right M2 segment.  2. Marked attenuation of the anterior superior right M2 branch.  3. Mild narrowing of the proximal right M1 segment, less than 50%.  4. The left MCA bifurcation is intact.  5. Atherosclerotic changes within a hypoplastic left vertebral artery which terminates at the left PICA with a severe stenosis at the origin of the left PICA.  6. 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  7. High-grade stenosis of the proximal left posterior cerebral artery.   Mr Brain Wo Contrast 10/13/2015   1. Two punctate acute nonhemorrhagic infarcts within the posterior right frontal lobe along the precentral gyrus. These may be related to acute motor symptoms.  2. Advanced atrophy and  diffuse white matter disease compatible with chronic microvascular ischemia.  3. Remote lacunar infarcts of the basal ganglia and brainstem.   CUS 07/2015 - left ICA patent s/p CEA, right ICA 1-39% stenosis  TTE 06/2015 - unremarkable EF 65%   PHYSICAL EXAM  Temp:  [97.5 F (36.4 C)-98.9 F (37.2 C)] 98.3 F (36.8 C) (05/21 1422) Pulse Rate:  [81-93] 93 (05/21 1422) Resp:  [16] 16 (05/21 1422) BP: (138-153)/(51-70) 150/57 mmHg (05/21 1422) SpO2:  [93 %-96 %] 94 % (05/21 1422) Weight:  [164 lb 7.4 oz (74.6 kg)] 164 lb 7.4 oz (74.6 kg) (05/21 0519)  General - Well nourished, well  developed, in no apparent distress.  Ophthalmologic - Fundi not visualized due to noncooperation.  Cardiovascular - Regular rate and rhythm.  Mental Status -  Level of arousal and orientation to place, and person were intact, but not orientated to time. Language including expression, naming, repetition, comprehension was assessed and found intact. Fund of Knowledge was assessed and was impaired.  Cranial Nerves II - XII - II - Visual field intact OU. III, IV, VI - Extraocular movements intact. V - Facial sensation intact bilaterally. VII - Facial movement intact bilaterally. VIII - Hearing & vestibular intact bilaterally. X - Palate elevates symmetrically. XI - Chin turning & shoulder shrug intact bilaterally. XII - Tongue protrusion intact.  Motor Strength - The patient's strength was normal in all extremities and pronator drift was absent.  Bulk was normal and fasciculations were absent.   Motor Tone - Muscle tone was assessed at the neck and appendages and was normal.  Reflexes - The patient's reflexes were 1+ in all extremities and she had no pathological reflexes.  Sensory - Light touch, temperature/pinprick were assessed and were symmetrical.    Coordination - The patient had normal movements in the hands with no ataxia or dysmetria.  Tremor was absent.  Gait and Station - deferred due to safety concerns.   ASSESSMENT/PLAN [name] is a 82 y.o. female with history of hypertension, anxiety, gait abnormality, COPD, vascular disease, carpal tunnel syndrome, and previous left carotid endarterectomy presenting with hand numbness and drooping left eye. She did not receive IV t-PA due to late presentation and minimal deficits.  Stroke:  Right CR/SO punctate infarcts, possibly due to large vessel disease given her severe multivessel athero (see below).  Resultant  Deficit resolved  MRI - two punctate acute infarcts within the posterior right frontal lobe along the  precentral gyrus. Remote lacunar infarcts b/l BG and brainstem.  MRA  diffuse high grade stenosis in multiple large vessels including right M1/M2, left P1, left PICA, right ICA siphon and right P2  Carotid Doppler - 08/15/2015 - unremarkable  2D Echo - 07/18/2015 - EF 65%  LDL 108  HgbA1c pending  VTE prophylaxis - Lovenox  Diet Heart Room service appropriate?: Yes; Fluid consistency:: Thin  clopidogrel 75 mg daily prior to admission, now on clopidogrel 75 mg daily. Due to intracranial stenosis, recommend DAPT for 3 months and then plavix alone.   Patient counseled to be compliant with her antithrombotic medications  Ongoing aggressive stroke risk factor management  Therapy recommendations: SNF  Disposition:  Pending  Hypertension  Stable  Permissive hypertension (OK if < 220/120) but gradually normalize in 5-7 days  Hyperlipidemia  Home meds:  No lipid lowering medications prior to admission  LDL 108, goal < 70  Add Lipitor 20 mg daily  Continue statin at discharge  Other Stroke Risk Factors  Advanced age  Cigarette smoker - quit 32 years ago  Hx stroke/TIA - by MRI  S/p left CEA  Other Active Problems  Lives in [facility] day [facility]  Neurology will sign off. Please call with questions. Pt will follow up with Darrol Angel NP at [facility] in about 2 months. Thanks for the consult.  Marvel Plan, MD PhD Stroke Neurology 10/14/2015 7:52 PM   To contact Stroke Continuity provider, please refer to [email or web address]. After hours, contact General Neurology

## 2015-10-14 NOTE — Discharge Summary (Signed)
Physician Discharge Summary  Tara Walters ZOX:[id number] DOB: [birth year] DOA: 10/13/2015  PCP: Bufford Spikes, DO  Admit date: 10/13/2015 Discharge date: 10/14/2015  Time spent: 20* minutes  Recommendations for Outpatient Follow-up:  1. Follow-up neurology in 2 months 2. Follow-up patient in 2 weeks 3. Patient to return to skilled nursing facility for short-term rehabilitation   Discharge Diagnoses:  Active Problems:   HTN (hypertension)   Insomnia   Constipation   CVA (cerebral infarction)   Stroke [facility]   Acute CVA (cerebrovascular accident) [facility])   Discharge Condition: Stable  Diet recommendation: Heart healthy diet  Filed Weights   10/14/15 0519  Weight: 74.6 kg (164 lb 7.4 oz)    History of present illness:  82 y.o. female with medical history significant for, but not necessarily limited to, COPD, hypothyroidism and peripheral vascular disease. Patient was seen by PCP May 9th regarding weakness with fall as well as new onset left lean in sitting position. Left proptosis noted on exam . Head CTscan did show any acute changes.  Last night around 10 PM patient noticed that her left hand was numb . She did not sleep well despite taking a sleeping pill . This morning symptoms still present. Her right third, fourth and fifth fingers are numb. This has never happened to her before.   ED Course: Afebrile, hemodynamically stable. MRI of the brain shows punctate areas of acute nonhemorrhagic infarction in the posterior right frontal lobe as well as remote lacunar infarcts of the basal ganglia and brainstem. EKG: Sinus rhythm, Prolonged PR interval of 238  Hospital Course:  Patient was admitted for acute normal neurologic function the posterior right frontal lobe, she had an echo and carotid duplex done in March 2017. These tests were not repeated. Patient has been started on Lipitor 20 mg by mouth daily. Patient does not have any significant neurologic deficit. Patient will go  back to skilled facility for short-term rehabilitation I discussed with neurologist Dr. Roda Shutters, who recommends discharge patient back to skilled facility on both aspirin and Plavix for 3 months. After 3 months discontinue aspirin and continue with Plavix only   Procedures:  None  Consultations:  Neurology  Discharge Exam: Filed Vitals:   10/14/15 0929 10/14/15 1422  BP: 152/51 150/57  Pulse: 87 93  Temp: 98.8 F (37.1 C) 98.3 F (36.8 C)  Resp: 16 16    General: Appears in no acute distress Cardiovascular: S1-S2 regular Respiratory: Clear to auscultation bilaterally  Discharge Instructions   Discharge Instructions    Diet - low sodium heart healthy    Complete by:  As directed      Increase activity slowly    Complete by:  As directed           Current Discharge Medication List    START taking these medications   Details  aspirin EC 81 MG tablet Take 1 tablet (81 mg total) by mouth daily. Qty: 30 tablet, Refills: 2    atorvastatin (LIPITOR) 20 MG tablet Take 1 tablet (20 mg total) by mouth daily at 6 PM. Qty: 30 tablet, Refills: 2      CONTINUE these medications which have NOT CHANGED   Details  acetaminophen (TYLENOL) 325 MG tablet Take 650 mg by mouth See admin instructions. Take 2 tablets (650 mg) by mouth 3 times daily, may also take 2 tablets (650 mg) every 6 hours as needed for pain    alum & mag hydroxide-simeth (MINTOX) 200-200-20 MG/5ML suspension Take 15 mLs by  mouth 2 (two) times daily.    amLODipine (NORVASC) 10 MG tablet Take 10 mg by mouth daily. for blood pressure    clopidogrel (PLAVIX) 75 MG tablet Take 75 mg by mouth daily.     eszopiclone (LUNESTA) 2 MG TABS Take 1 tablet (2 mg total) by mouth at bedtime. Take immediately before bedtime   Associated Diagnoses: Insomnia    famotidine (PEPCID) 20 MG tablet Take 20 mg by mouth 2 (two) times daily.     loratadine (CLARITIN) 10 MG tablet Take 10 mg by mouth daily.    mirabegron ER  (MYRBETRIQ) 50 MG TB24 tablet Take 100 mg by mouth at bedtime.    nystatin cream (MYCOSTATIN) Apply 1 application topically 2 (two) times daily. Apply to per area twice daily until erythema resolved    pantoprazole (PROTONIX) 40 MG tablet Take 40 mg by mouth daily.    Polyethyl Glycol-Propyl Glycol (SYSTANE) 0.4-0.3 % GEL ophthalmic gel Place 1 application into both eyes 4 (four) times daily.    polyethylene glycol powder (GLYCOLAX/MIRALAX) powder Take 17 g by mouth daily as needed (constipation). Mix in 8 oz liquid and drink    senna-docusate (SENNA S) 8.6-50 MG tablet Take 2 tablets by mouth at bedtime.    sodium chloride (OCEAN) 0.65 % SOLN nasal spray Place 1 spray into both nostrils 2 (two) times daily.    SUPER B COMPLEX/C PO Take 2 tablets by mouth daily.       Allergies  Allergen Reactions  . Tramadol Nausea Only      The results of significant diagnostics from this hospitalization (including imaging, microbiology, ancillary and laboratory) are listed below for reference.    Significant Diagnostic Studies: Dg Chest 2 View  10/13/2015  CLINICAL DATA:  Shortness of breath EXAM: CHEST  2 VIEW COMPARISON:  May 31, 2009 FINDINGS: Cardiomegaly persists. A calcified nodule in the right lung base is again identified. The hila and mediastinum are unchanged. No pneumothorax. No suspicious pulmonary nodules, masses, or focal infiltrates. IMPRESSION: No active cardiopulmonary disease. Electronically Signed   By: Gerome Samavid  Williams III M.D   On: 10/13/2015 14:26   Ct Head Wo Contrast  10/02/2015  CLINICAL DATA:  Weakness for 1 month with lethargy. EXAM: CT HEAD WITHOUT CONTRAST TECHNIQUE: Contiguous axial images were obtained from the base of the skull through the vertex without intravenous contrast. COMPARISON:  CT head 11/11/2008. FINDINGS: No evidence for acute infarction, hemorrhage, mass lesion, or extra-axial fluid. Global atrophy with hydrocephalus ex vacuo. Extensive confluent white  matter hypoattenuation favored to represent chronic microvascular ischemic change. Calvarium intact. Vascular calcification. No sinus or mastoid disease. RIGHT cataract extraction. IMPRESSION: Progressive atrophy and white matter disease since 2010. No acute intracranial findings. Electronically Signed   By: Elsie StainJohn T Curnes M.D.   On: 10/02/2015 07:41   Mr Maxine GlennMra Head Wo Contrast  10/13/2015  CLINICAL DATA:  Punctate right MCA territory infarcts. EXAM: MRA HEAD WITHOUT CONTRAST TECHNIQUE: Angiographic images of the Circle of Willis were obtained using MRA technique without intravenous contrast. COMPARISON:  MRI brain from the same day. FINDINGS: Minimal atherosclerotic changes are present within the cavernous internal carotid arteries bilaterally. The ICA termini are intact. There is mild narrowing of the proximal right M1 segment, less than 50%. A high-grade stenosis is present in the distal right M1 segment or proximal M2 segment just be on a smaller anterior branch. The left MCA bifurcation is intact. No definite anterior communicating artery is seen. Distal posterior right MCA branch vessels  are visualized beyond a high-grade stenosis. The anterior superior MCA branch vessels are markedly diminished. The right vertebral artery is the dominant vessel. The right AICA is dominant. The left vertebral artery terminates at the PICA. Moderate to severe stenosis is present at the left PICA origin. The vertebrobasilar or junction is fenestrated. The basilar artery is otherwise normal. A high-grade stenosis is present in the proximal left posterior cerebral artery. Moderate distal stenosis is present in the right PCA. IMPRESSION: 1. High-grade stenosis of the distal right M1 segment or proximal posterior right M2 segment. 2. Marked attenuation of the anterior superior right M2 branch. 3. Mild narrowing of the proximal right M1 segment, less than 50%. 4. The left MCA bifurcation is intact. 5. Atherosclerotic changes within a  hypoplastic left vertebral artery which terminates at the left PICA with a severe stenosis at the origin of the left PICA. 6. 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 7. High-grade stenosis of the proximal left posterior cerebral artery. Electronically Signed   By: Marin Roberts M.D.   On: 10/13/2015 17:11   Mr Brain Wo Contrast  10/13/2015  CLINICAL DATA:  Left hand numbness beginning at 10:30 p.m. yesterday. Left facial droop in leaning to the left notice today. Left hand numbness has improved. EXAM: MRI HEAD WITHOUT CONTRAST TECHNIQUE: Multiplanar, multiecho pulse sequences of the brain and surrounding structures were obtained without intravenous contrast. COMPARISON:  CT head without contrast 10/01/2015. FINDINGS: 2 punctate foci of acute nonhemorrhagic infarction are present in the posterior right frontal lobe along the precentral gyrus. No other areas of acute or subacute infarction are present. No acute hemorrhage or mass lesion is evident. Advanced atrophy and diffuse white matter disease is present bilaterally. Remote cortical infarcts are present in the right pre frontal gyrus. The ventricles are proportionate to the degree of atrophy. Multiple remote lacunar infarcts are present in the basal ganglia bilaterally. A remote right paramedian pontine infarct is present. White matter changes extend into the central pons. The cerebellum is normal. Flow is present in the major intracranial arteries. A right lens replacement is noted. The globes and orbits are otherwise intact. The paranasal sinuses and mastoid air cells are clear. The skullbase is normal.  Midline sagittal images are unremarkable. IMPRESSION: 1. Two punctate acute nonhemorrhagic infarcts within the posterior right frontal lobe along the precentral gyrus. These may be related to acute motor symptoms. 2. Advanced atrophy and diffuse white matter disease compatible with chronic microvascular ischemia. 3. Remote lacunar infarcts of the  basal ganglia and brainstem. Electronically Signed   By: Marin Roberts M.D.   On: 10/13/2015 11:51    Microbiology: No results found for this or any previous visit (from the past 240 hour(s)).   Labs: Basic Metabolic Panel:  Recent Labs Lab 10/13/15 0851 10/13/15 0914  NA 139 140  K 4.0 3.9  CL 102 100*  CO2 27  --   GLUCOSE 112* 107*  BUN 17 19  CREATININE 0.82 0.80  CALCIUM 9.4  --    Liver Function Tests:  Recent Labs Lab 10/13/15 0851  AST 16  ALT 18  ALKPHOS 60  BILITOT 0.6  PROT 6.8  ALBUMIN 3.7   No results for input(s): LIPASE, AMYLASE in the last 168 hours. No results for input(s): AMMONIA in the last 168 hours. CBC:  Recent Labs Lab 10/13/15 0851 10/13/15 0914  WBC 6.1  --   NEUTROABS 4.7  --   HGB 13.0 15.0  HCT 40.3 44.0  MCV 85.4  --  PLT 298  --        Signed:  Meredeth Ide MD.  [facility] 10/14/2015, 3:53 PM

## 2015-10-14 NOTE — Evaluation (Signed)
Physical Therapy Evaluation Patient Details Name: Tara Walters MRN: [id number] DOB: [birth year] Today's Date: 10/14/2015   History of Present Illness  82 y.o. female with medical history significant for, but not necessarily limited to, COPD, hypothyroidism and peripheral vascular disease. Presented to the ED on 5/20 with L facial droop and L hand numbness. MRI on 5/20 + for posterior right frontal lobe infarct as well as remote lacunar infarcts of the basal ganglia and brainstem   Clinical Impression  Pt demo reliance on assist and external support for all mobility.  She did not appear to fully participate in therapy today as she rolled her eyes upon request to engage in mobility tasks in therapy. Per pt report, she had limited mobility prior to this admission.  Therapy will continue to follow to assist with discharge planning and follow up recommendations.  Will continue to follow patient while on this venue of care to progress mobility. Mobility most limited today by pain in her knees and limited standing tolerance.    Follow Up Recommendations SNF;Supervision/Assistance - 24 hour    Equipment Recommendations  None recommended by PT    Recommendations for Other Services       Precautions / Restrictions Precautions Precautions: Fall Restrictions Weight Bearing Restrictions: No      Mobility  Bed Mobility Overal bed mobility: Needs Assistance;+2 for physical assistance Bed Mobility: Supine to Sit     Supine to sit: Mod assist;+2 for physical assistance     General bed mobility comments: mod cues for seqeucning and hand placement, demo decr problem solving, raised HOB and use of rail  Transfers Overall transfer level: Needs assistance Equipment used: Rolling walker (2 wheeled) Transfers: Sit to/from [employer] Sit to Stand: Mod assist;+2 physical assistance Stand pivot transfers: Mod assist;+2 physical assistance       General transfer comment: mod cues  for hand placement (limited standing tolerance at RW)  Ambulation/Gait                Stairs            Wheelchair Mobility    Modified Rankin (Stroke Patients Only) Modified Rankin (Stroke Patients Only) Pre-Morbid Rankin Score: Moderate disability Modified Rankin: Moderately severe disability     Balance Overall balance assessment: Needs assistance Sitting-balance support: Bilateral upper extremity supported;Feet supported Sitting balance-Leahy Scale: Poor Sitting balance - Comments: posterior LOB, postural fatigue Postural control: Posterior lean (while nursing placed eye drops) Standing balance support: Bilateral upper extremity supported Standing balance-Leahy Scale: Poor Standing balance comment: standing tolerance ~1 min while OT performed hygeine task                             Pertinent Vitals/Pain Pain Assessment: 0-10 Pain Score: 6  Pain Location: bil knees Pain Descriptors / Indicators: Discomfort;Sore;Constant Pain Intervention(s): Limited activity within patient's tolerance;Monitored during session;Repositioned    Home Living Family/patient expects to be discharged to:: Skilled nursing facility                      Prior Function Level of Independence: Needs assistance   Gait / Transfers Assistance Needed: Utilizes RW with assist in room, w/c for access to dining room           Hand Dominance        Extremity/Trunk Assessment   Upper Extremity Assessment: Defer to OT evaluation  Lower Extremity Assessment: Generalized weakness      Cervical / Trunk Assessment: Kyphotic  Communication   Communication: No difficulties  Cognition Arousal/Alertness: Awake/alert Behavior During Therapy: Flat affect Overall Cognitive Status: No family/caregiver present to determine baseline cognitive functioning                      General Comments General comments (skin integrity, edema, etc.): upon  entering room, gown and bedding wet with urine, pt needed assist for hygeine and gown change    Exercises        Assessment/Plan    PT Assessment Patient needs continued PT services  PT Diagnosis Difficulty walking;Abnormality of gait;Generalized weakness   PT Problem List Decreased strength;Decreased activity tolerance;Decreased balance;Decreased mobility;Decreased knowledge of use of DME;Decreased safety awareness;Decreased knowledge of precautions;Pain  PT Treatment Interventions DME instruction;Gait training;Functional mobility training;Therapeutic activities;Therapeutic exercise;Balance training;Neuromuscular re-education;Patient/family education   PT Goals (Current goals can be found in the Care Plan section) Acute Rehab PT Goals Patient Stated Goal: knees not hurt PT Goal Formulation: With patient Time For Goal Achievement: 10/26/15 Potential to Achieve Goals: Fair    Frequency Min 3X/week   Barriers to discharge Decreased caregiver support was living in ALF leel of care prior to hospitalization    Co-evaluation PT/OT/SLP Co-Evaluation/Treatment: Yes Reason for Co-Treatment: Complexity of the patient's impairments (multi-system involvement);For patient/therapist safety PT goals addressed during session: Mobility/safety with mobility;Proper use of DME         End of Session Equipment Utilized During Treatment: Gait belt Activity Tolerance: Patient limited by pain Patient left: in chair;with call bell/phone within reach Nurse Communication: Mobility status         Time: [id number] PT Time Calculation (min) (ACUTE ONLY): 20 min   Charges:   PT Evaluation $PT Eval Moderate Complexity: 1 Procedure     PT G CodesNestor Lewandowsky:       Deyvi Bonanno M Egbert Seidel, PT [phone]  Shaune Westfall 10/14/2015, 5:09 PM

## 2015-10-14 NOTE — Clinical Social Work Note (Signed)
Patient to be d/c'ed today back to [facility] to continue with her short term rehab.  Patient and family agreeable to plans will transport via ems RN to call report to [phone].  CSW contacted patient's daughter April at [phone] and left message on voice mail that patient will be returning back to SNF today.  Windell MouldingEric Mykael Trott, MSW, Theresia MajorsLCSWA [phone]

## 2015-10-14 NOTE — Clinical Social Work Note (Signed)
Clinical Social Work Assessment  Patient Details  Name: Emilia BeckJanet F Koo MRN: [id number] Date of Birth: 27-Jun-1921  Date of referral:  10/14/15               Reason for consult:  Facility Placement                Permission sought to share information with:  Facility Medical sales representativeContact Representative, Family Supports Permission granted to share information::  Yes, Verbal Permission Granted  Name::     Reed,April Daughter [phone] or [phone]  Agency::  SNF admissions  Relationship::     Contact Information:     Housing/Transportation Living arrangements for the past 2 months:  Skilled Nursing Facility Source of Information:  Patient Patient Interpreter Needed:  None Criminal Activity/Legal Involvement Pertinent to Current Situation/Hospitalization:  No - Comment as needed Significant Relationships:  Adult Children, Other Family Members Lives with:  Facility Resident Do you feel safe going back to the place where you live?  Yes Need for family participation in patient care:  No (Coment)  Care giving concerns: Patient did not express any concerns to returning back to SNF.  Social Worker assessment / plan: Patient is a 82 year old female who is from ALF at [facility], but she is currently in the SNF rehab section of [facility].  Patient expressed she has been in the rehab for a few days, and is pleased with the care that is being provided.  Patient stated she is looking forward to returning back to SNF and then eventually back to her ALF.  Patient was explained role of CSW and informed of the discharge process.  CSW spoke to patient and informed her that the physician will be discharging her today.  Patient expressed gratitude that she is leaving today, patient plans to return to SNF.  Updated clinicals sent to [facility] along with discharge summary.   Employment status:  Retired Database administratornsurance information:  Managed Medicare PT Recommendations:  Skilled Nursing Facility Information /  Referral to community resources:  Skilled Nursing Facility  Patient/Family's Response to care:  Patient in agreement to returning back to SNF.  Patient/Family's Understanding of and Emotional Response to Diagnosis, Current Treatment, and Prognosis:  Patient aware of diagnosis and current treatment plan.  Emotional Assessment Appearance:    Attitude/Demeanor/Rapport:    Affect (typically observed):  Calm, Pleasant, Appropriate Orientation:  Oriented to Self, Oriented to Place, Oriented to  Time, Oriented to Situation Alcohol / Substance use:  Not Applicable Psych involvement (Current and /or in the community):  No (Comment)  Discharge Needs  Concerns to be addressed:  No discharge needs identified Readmission within the last 30 days:  No Current discharge risk:  None Barriers to Discharge:  No Barriers Identified   Darleene Cleavernterhaus, Novi Calia R, LCSWA 10/14/2015, 4:36 PM

## 2015-10-14 NOTE — Evaluation (Signed)
Occupational Therapy Evaluation Patient Details Name: Tara BeckJanet F Walters MRN: [id number] DOB: 20-Sep-1921 Today's Date: 10/14/2015    History of Present Illness 82 y.o. female with medical history significant for, but not necessarily limited to, COPD, hypothyroidism and peripheral vascular disease. Presented to the ED on 5/20 with L facial droop and L hand numbness. MRI on 5/20 + for posterior right frontal lobe infarct as well as remote lacunar infarcts of the basal ganglia and brainstem    Clinical Impression   Pt reports she required assist from staff at SNF for bathing and dressing PTA. Currently pt requires mod assist +2 for stand pivot transfer, min assist for seated ADLs, and max-total assist for LB ADLs. Pt from [facility], plan is to return to SNF upon d/c; agree with SNF for follow up. Pt would benefit from continued skilled OT to address established goals.    Follow Up Recommendations  SNF;Supervision/Assistance - 24 hour    Equipment Recommendations  None recommended by OT    Recommendations for Other Services       Precautions / Restrictions Precautions Precautions: Fall Restrictions Weight Bearing Restrictions: No      Mobility Bed Mobility Overal bed mobility: Needs Assistance;+2 for physical assistance Bed Mobility: Supine to Sit     Supine to sit: Max assist     General bed mobility comments: Assist for elevating trunk from supine to sit. VCs for hand placement and sequencing.  Transfers Overall transfer level: Needs assistance Equipment used: Rolling walker (2 wheeled) Transfers: Sit to/from [employer]GI CorporationStand;Stand Pivot Transfers Sit to Stand: Mod assist;+2 physical assistance Stand pivot transfers: Mod assist;+2 physical assistance       General transfer comment: VCs for hand placement. Pt c/o bil knee pain with sitting EOB and standing.    Balance Overall balance assessment: Needs assistance Sitting-balance support: Feet supported;Bilateral upper  extremity supported Sitting balance-Leahy Scale: Poor     Standing balance support: Bilateral upper extremity supported;During functional activity Standing balance-Leahy Scale: Poor Standing balance comment: RW for support                            ADL Overall ADL's : Needs assistance/impaired Eating/Feeding: Set up;Sitting   Grooming: Minimal assistance;Sitting   Upper Body Bathing: Minimal assitance;Sitting   Lower Body Bathing: Maximal assistance;Sit to/from stand;+2 for physical assistance   Upper Body Dressing : Minimal assistance;Sitting Upper Body Dressing Details (indicate cue type and reason): to doff/don hospital gown Lower Body Dressing: Total assistance;Sit to/from stand;+2 for physical assistance   Toilet Transfer: Moderate assistance;+2 for physical assistance;Stand-pivot;BSC;RW Toilet Transfer Details (indicate cue type and reason): Simulated by stand pivot from EOB to chair. Toileting- Clothing Manipulation and Hygiene: Total assistance;Sit to/from stand;+2 for physical assistance       Functional mobility during ADLs: Moderate assistance;+2 for physical assistance;Rolling walker (for stand pivot only)       Vision Vision Assessment?: Vision impaired- to be further tested in functional context   Perception     Praxis      Pertinent Vitals/Pain Pain Assessment: Faces Faces Pain Scale: Hurts even more Pain Location: bil knees Pain Descriptors / Indicators: Grimacing;Guarding Pain Intervention(s): Limited activity within patient's tolerance;Monitored during session;Repositioned     Hand Dominance     Extremity/Trunk Assessment Upper Extremity Assessment Upper Extremity Assessment: Generalized weakness   Lower Extremity Assessment Lower Extremity Assessment: Defer to PT evaluation   Cervical / Trunk Assessment Cervical / Trunk Assessment: Kyphotic   Communication  Communication Communication: No difficulties   Cognition  Arousal/Alertness: Awake/alert Behavior During Therapy: Flat affect Overall Cognitive Status: No family/caregiver present to determine baseline cognitive functioning                     General Comments       Exercises       Shoulder Instructions      Home Living Family/patient expects to be discharged to:: Skilled nursing facility                                        Prior Functioning/Environment Level of Independence: Needs assistance  Gait / Transfers Assistance Needed: w/c for mobility outside of room. RW with assist inside room. ADL's / Homemaking Assistance Needed: Staff assist with bathing and dressing. Pt incontinent.        OT Diagnosis: Generalized weakness;Cognitive deficits;Acute pain   OT Problem List: Decreased strength;Decreased activity tolerance;Impaired balance (sitting and/or standing);Decreased cognition;Decreased safety awareness;Decreased knowledge of use of DME or AE;Decreased knowledge of precautions;Pain   OT Treatment/Interventions: Self-care/ADL training;Therapeutic exercise;Energy conservation;DME and/or AE instruction;Therapeutic activities;Patient/family education;Balance training    OT Goals(Current goals can be found in the care plan section) Acute Rehab OT Goals Patient Stated Goal: none stated OT Goal Formulation: With patient Time For Goal Achievement: 10/28/15 Potential to Achieve Goals: Good ADL Goals Pt Will Perform Grooming: with supervision;sitting Pt Will Perform Upper Body Bathing: with supervision;sitting Pt Will Perform Lower Body Bathing: with min assist;sit to/from stand Pt Will Transfer to Toilet: bedside commode;stand pivot transfer;with min assist Pt Will Perform Toileting - Clothing Manipulation and hygiene: with min assist;sit to/from stand  OT Frequency: Min 2X/week   Barriers to D/C:            Co-evaluation              End of Session Equipment Utilized During Treatment: Gait  belt;Rolling walker Nurse Communication: Mobility status  Activity Tolerance: Patient limited by pain Patient left: in chair;with call bell/phone within reach;with chair alarm set   Time: 1610-9604 OT Time Calculation (min): 21 min Charges:  OT General Charges $OT Visit: 1 Procedure OT Evaluation $OT Eval Moderate Complexity: 1 Procedure G-Codes: OT G-codes **NOT FOR INPATIENT CLASS** Functional Assessment Tool Used: Clinical judgement Functional Limitation: Self care Self Care Current Status (V4098): At least 40 percent but less than 60 percent impaired, limited or restricted Self Care Goal Status (J1914): At least 20 percent but less than 40 percent impaired, limited or restricted   Gaye Alken M.S., OTR/L Pager: [phone]  10/14/2015, 1:18 PM

## 2015-10-14 NOTE — NC FL2 (Signed)
[place] MEDICAID FL2 LEVEL OF CARE SCREENING TOOL     IDENTIFICATION  Patient Name: Tara Walters Birthdate: [birth year] Sex: female Admission Date (Current Location): 10/13/2015  [facility] and [place] Number:  Producer, television/film/video and Address:  [facility] [place], [address], [place], [place] [address]      Provider Number: [id number]  Attending Physician Name and Address:  Meredeth Ide, MD  Relative Name and Phone Number:  Reed,[phone]    Current Level of Care: Hospital Recommended Level of Care: Skilled Nursing Facility Prior Approval Number:    Date Approved/Denied:   PASRR Number: [id number]  Discharge Plan: SNF    Current Diagnoses: Patient Active Problem List   Diagnosis Date Noted  . CVA (cerebral infarction) 10/13/2015  . Stroke (HCC) 10/13/2015  . Acute CVA (cerebrovascular accident) (HCC)   . Medicare annual wellness visit, subsequent 03/21/2015  . Occlusion and stenosis of carotid artery without mention of cerebral infarction 08/03/2013  . PVD (peripheral vascular disease) with claudication (HCC) 08/03/2013  . Edema 02/28/2013  . Vaginal atrophy 12/12/2012  . Hair thinning 12/12/2012  . Carpal tunnel syndrome 12/09/2012  . Cataract cortical, senile 12/09/2012  . Spastic colon 03/11/2010  . PVD (peripheral vascular disease) (HCC) 08/28/2008  . Hemorrhoids, internal 08/28/2008  . HTN (hypertension) 05/22/2008  . Neuropathy, peripheral (HCC) 02/14/2008  . Carotid stenosis 11/19/2007  . Vitamin D deficiency 02/01/2007  . Anxiety 12/16/2006  . Gait disorder 12/02/2006  . Insomnia 08/31/2006  . Constipation 05/20/2006  . Urinary incontinence 01/05/2006  . Urinary frequency 01/05/2006  . Hypothyroidism 09/22/2005  . GERD (gastroesophageal reflux disease) 09/08/2005    Orientation RESPIRATION BLADDER Height & Weight     Self, Time, Situation, Place  Normal Incontinent Weight: 164 lb 7.4 oz (74.6  kg) Height:  5' 5.5" (166.4 cm)  BEHAVIORAL SYMPTOMS/MOOD NEUROLOGICAL BOWEL NUTRITION STATUS      Continent Diet (Regular)  AMBULATORY STATUS COMMUNICATION OF NEEDS Skin   Limited Assist Verbally Normal                       Personal Care Assistance Level of Assistance  Bathing, Dressing Bathing Assistance: Limited assistance   Dressing Assistance: Limited assistance     Functional Limitations Info             SPECIAL CARE FACTORS FREQUENCY   PT OT  5x a week  5x a week                 Contractures      Additional Factors Info  Code Status  Allergies Code Status Info: DNR   Tramadol            Current Medications (10/14/2015):  This is the current hospital active medication list Current Facility-Administered Medications  Medication Dose Route Frequency Provider Last Rate Last Dose  . atorvastatin (LIPITOR) tablet 20 mg  20 mg Oral q1800 David L Rinehuls, PA-C      . clopidogrel (PLAVIX) tablet 75 mg  75 mg Oral Daily Meredith Pel, NP   75 mg at 10/14/15 0953  . enoxaparin (LOVENOX) injection 40 mg  40 mg Subcutaneous Q24H Meredith Pel, NP   40 mg at 10/13/15 1800  . mirabegron ER (MYRBETRIQ) tablet 100 mg  100 mg Oral QHS Meredith Pel, NP   100 mg at 10/13/15 2317  . pantoprazole (PROTONIX) EC tablet 40 mg  40 mg Oral Daily Meredith Pel,  NP   40 mg at 10/14/15 0953  . polyethylene glycol (MIRALAX / GLYCOLAX) packet 17 g  17 g Oral Daily PRN Meredith PelPaula M Guenther, NP      . polyvinyl alcohol (LIQUIFILM TEARS) 1.4 % ophthalmic solution 1 drop  1 drop Both Eyes QID Meredith PelPaula M Guenther, NP   1 drop at 10/14/15 1358  . senna-docusate (Senokot-S) tablet 2 tablet  2 tablet Oral QHS Meredith PelPaula M Guenther, NP   2 tablet at 10/13/15 2317  . zolpidem (AMBIEN) tablet 5 mg  5 mg Oral QHS PRN Meredith PelPaula M Guenther, NP         Discharge Medications: Please see discharge summary for a list of discharge medications.  Relevant Imaging Results:  Relevant Lab  Results:   Additional Information SSN [id number]  Darleene Cleavernterhaus, Yamaira Spinner R, [place]

## 2015-10-14 NOTE — Progress Notes (Signed)
Pt discharged from hospital per orders from MD. [facility]. Pt verbalized understanding of situation. IV's were removed from pt before discharge. Daughter [phone] also informed of pt's return to [facility]. Pt exited hospital via transport.

## 2015-10-15 LAB — HEMOGLOBIN A1C
Hgb A1c MFr Bld: 6 % — ABNORMAL HIGH (ref 4.8–5.6)
Mean Plasma Glucose: 126 mg/dL

## 2015-10-16 ENCOUNTER — Non-Acute Institutional Stay (SKILLED_NURSING_FACILITY): Payer: Medicare Other | Admitting: Internal Medicine

## 2015-10-16 ENCOUNTER — Encounter: Payer: Self-pay | Admitting: Internal Medicine

## 2015-10-16 DIAGNOSIS — I739 Peripheral vascular disease, unspecified: Secondary | ICD-10-CM

## 2015-10-16 DIAGNOSIS — R269 Unspecified abnormalities of gait and mobility: Secondary | ICD-10-CM | POA: Diagnosis not present

## 2015-10-16 DIAGNOSIS — I6529 Occlusion and stenosis of unspecified carotid artery: Secondary | ICD-10-CM

## 2015-10-16 DIAGNOSIS — I639 Cerebral infarction, unspecified: Secondary | ICD-10-CM | POA: Diagnosis not present

## 2015-10-16 DIAGNOSIS — E039 Hypothyroidism, unspecified: Secondary | ICD-10-CM | POA: Diagnosis not present

## 2015-10-16 DIAGNOSIS — K5900 Constipation, unspecified: Secondary | ICD-10-CM

## 2015-10-16 DIAGNOSIS — G6289 Other specified polyneuropathies: Secondary | ICD-10-CM | POA: Diagnosis not present

## 2015-10-16 DIAGNOSIS — N3941 Urge incontinence: Secondary | ICD-10-CM | POA: Diagnosis not present

## 2015-10-16 NOTE — Progress Notes (Signed)
Patient ID: Tara Walters, female   DOB: 09-Aug-1921, 82 y.o.   MRN: [id number]   Provider:  Gwenith Spitz. Renato Gails, D.O., C.M.D. Location:  [facility] Room Number: 141 rehab Place of Service:  SNF (31)  PCP: Bufford Spikes, DO Patient Care Team: [name], DO as PCP - General (Geriatric Medicine) [facility] Fletcher Anon, NP as Nurse Practitioner (Nurse Practitioner)  Extended Emergency Contact Information Primary Emergency Contact: [name] [address] [place] Home Phone: [phone] Mobile Phone: [phone] Relation: Daughter Secondary Emergency Contact: Ayano Douthitt,[place]  [place] Home Phone: [phone] Mobile Phone: [phone] Relation: Relative  Code Status: DNR Goals of Care: Advanced Directive information Advanced Directives 10/16/2015  Does patient have an advance directive? Yes  Type of Estate agent of [place];Living will  Does patient want to make changes to advanced directive? -  Copy of advanced directive(s) in chart? Yes  Pre-existing out of facility DNR order (yellow form or pink MOST form) Yellow form placed in chart (order not valid for inpatient use)   Chief Complaint  Patient presents with  . Readmit to Rehab    rehab admit   HPI: Patient is a 82 y.o. female seen today for readmission to rehab 10/14/15 s/p hospitalization for left hand numbness 5/19.  She apparently reported numbness of her left 3rd, 4th and 5th fingers which persisted.  Her MRI revealed an acute posterior right frontal lobe acute nonhemorrhagic infarct and remote lacunar infarcts of basal ganglia and brainstem.  Some of the notes say her symptoms are related to the acute stroke and some say it's a peripheral compression syndrome.  In her case, she's been weak and leaning left for several weeks suggesting that one of the old infarcts did that.  She was placed on a 3 month  course of baby asa along with her lifelong plavix.  She was also started back on a statin which she never tolerated in the past (lipitor 20mg  with her evening meal).  She is also 89 and wants to be kept comfortable.  For some reason, she was discharged on 100mg  myrbetriq which has been reduced to the normal 50mg .  She is getting PT, OT, ST and f/u with Dr. Roda Shutters in 2 mos.  She's on a heart healthy chopped diet with thin liquids.  She's ben having urinary incontinence with irritation of her perineum.  Past Medical History  Diagnosis Date  . Hypertension   . Anxiety   . Abnormality of gait   . Irritable bowel syndrome 03/11/2010  . Urge urinary incontinence   . Allergic rhinitis   . COPD (chronic obstructive pulmonary disease) (HCC)   . Diverticulosis of colon   . Peripheral vascular disease (HCC)   . Carotid stenosis, right     asymptomatic  <40%  right  ica  and right eca  . PAD (peripheral artery disease) (HCC) monitored by dr Rubye Oaks    moderate  . Claudication, intermittent (HCC)   . Internal hemorrhoids   . History of esophagitis   . History of hypothyroidism   . Arthritis   . Unspecified hereditary and idiopathic peripheral neuropathy   . Carpal tunnel syndrome on both sides   . Fracture, calcaneus closed    Past Surgical History  Procedure Laterality Date  . Carotid endarterectomy Left 11-18-2007  . Dilation and curettage of uterus  1973  . Colonoscopy  2005  mild diverticulosis  . Cataract extraction w/ intraocular lens implant Right 11/2012  . Cystoscopy with injection N/A 09/06/2013    Procedure: CYSTOSCOPY WITH BOTOX  INJECTION;  Surgeon: Martina Sinner, MD;  Location: [facility] [place];  Service: Urology;  Laterality: N/A;    reports that she quit smoking about 32 years ago. Her smoking use included Cigarettes. She has a 50 pack-year smoking history. She has never used smokeless tobacco. She reports that she does not drink alcohol or use illicit  drugs. Social History   Social History  . Marital Status: Widowed    Spouse Name: N/A  . Number of Children: N/A  . Years of Education: N/A   Occupational History  . retired Production manager    Social History Main Topics  . Smoking status: Former Smoker -- 1.00 packs/day for 50 years    Types: Cigarettes    Quit date: 05/27/1983  . Smokeless tobacco: Never Used  . Alcohol Use: No  . Drug Use: No  . Sexual Activity: No   Other Topics Concern  . Not on file   Social History Narrative   Widowed, Retired Production manager.  Patient lives in  Assisted Living  section at [facility] since [facility],[place] apt[place] [place] [facility].     Stop smoking 1985, previously smoked one pack a day for about 50 years   . Minimal  Alcohol history   Patient has  Advanced planning documents: Living Will, DNR, POA   No brothers or sisters. No children, five stepchildren.    Walks with walker                Functional Status Survey: Is the patient deaf or have difficulty hearing?: Yes Does the patient have difficulty seeing, even when wearing glasses/contacts?: Yes Does the patient have difficulty concentrating, remembering, or making decisions?: Yes Does the patient have difficulty walking or climbing stairs?: Yes Does the patient have difficulty dressing or bathing?: Yes Does the patient have difficulty doing errands alone such as visiting a doctor's office or shopping?: Yes  Family History  Problem Relation Age of Onset  . Heart attack Mother   . Cancer Father     Leukemia  . Diabetes Father     Health Maintenance  Topic Date Due  . Rakayla Berlin  01/17/1941  . ZOSTAVAX  01/17/1984  . DEXA SCAN  01/16/1989  . INFLUENZA VACCINE  12/25/2015  . PNA vac Low Risk Adult  Completed    Allergies  Allergen Reactions  . Tramadol Nausea Only      Medication List       This list is accurate as of: 10/16/15 11:59 PM.  Always use your most recent med list.                acetaminophen 325 MG tablet  Commonly known as:  TYLENOL  Take 650 mg by mouth See admin instructions. Take 2 tablets (650 mg) by mouth 3 times daily, may also take 2 tablets (650 mg) every 6 hours as needed for pain     amLODipine 10 MG tablet  Commonly known as:  NORVASC  Take 10 mg by mouth daily. for blood pressure     aspirin EC 81 MG tablet  Take 1 tablet (81 mg total) by mouth daily.     atorvastatin 20 MG tablet  Commonly known as:  LIPITOR  Take 1 tablet (20 mg total) by mouth daily at 6 PM.  clopidogrel 75 MG tablet  Commonly known as:  PLAVIX  Take 75 mg by mouth daily.     eszopiclone 2 MG Tabs tablet  Commonly known as:  LUNESTA  Take 1 tablet (2 mg total) by mouth at bedtime. Take immediately before bedtime     famotidine 20 MG tablet  Commonly known as:  PEPCID  Take 20 mg by mouth 2 (two) times daily.     loratadine 10 MG tablet  Commonly known as:  CLARITIN  Take 10 mg by mouth daily.     MINTOX 200-200-20 MG/5ML suspension  Generic drug:  alum & mag hydroxide-simeth  Take 15 mLs by mouth 2 (two) times daily as needed for indigestion or heartburn.     MYRBETRIQ 50 MG Tb24 tablet  Generic drug:  mirabegron ER  Take 100 mg by mouth at bedtime.     nystatin cream  Commonly known as:  MYCOSTATIN  Apply 1 application topically 2 (two) times daily. Apply to per area twice daily until erythema resolved     pantoprazole 40 MG tablet  Commonly known as:  PROTONIX  Take 40 mg by mouth daily.     polyethylene glycol powder powder  Commonly known as:  GLYCOLAX/MIRALAX  Take 17 g by mouth daily as needed (constipation). Mix in 8 oz liquid and drink     SENNA S 8.6-50 MG tablet  Generic drug:  senna-docusate  Take 2 tablets by mouth at bedtime.     sodium chloride 0.65 % Soln nasal spray  Commonly known as:  OCEAN  Place 1 spray into both nostrils 2 (two) times daily.     SUPER B COMPLEX/C PO  Take 2 tablets by mouth daily.     SYSTANE  0.4-0.3 % Gel ophthalmic gel  Generic drug:  Polyethyl Glycol-Propyl Glycol  Place 1 application into both eyes 4 (four) times daily.        Review of Systems  Constitutional: Positive for activity change, appetite change and fatigue. Negative for fever and chills.  HENT: Positive for hearing loss. Negative for congestion and sore throat.   Eyes: Negative for visual disturbance.       Vision declining  Respiratory: Negative for cough and shortness of breath.   Cardiovascular: Negative for chest pain, palpitations and leg swelling.  Gastrointestinal: Positive for constipation. Negative for nausea, vomiting, abdominal pain, diarrhea, blood in stool, abdominal distention and anal bleeding.  Genitourinary: Positive for urgency and frequency. Negative for dysuria and difficulty urinating.       Chronic OAB  Musculoskeletal: Positive for arthralgias.  Neurological: Positive for weakness. Negative for dizziness, tremors, light-headedness and headaches.  Hematological: Negative for adenopathy. Bruises/bleeds easily.  Psychiatric/Behavioral: Positive for confusion. Negative for agitation.    Filed Vitals:   10/16/15 1057  BP: 144/75  Pulse: 84  Temp: 98.7 F (37.1 C)  TempSrc: Oral  Resp: 18  Weight: 171 lb (77.565 kg)  SpO2: 93%   Body mass index is 28.01 kg/(m^2). Physical Exam  Constitutional: She is oriented to person, place, and time. She appears well-developed and well-nourished. No distress.  HENT:  Head: Normocephalic and atraumatic.  Right Ear: External ear normal.  Left Ear: External ear normal.  Mouth/Throat: Oropharynx is clear and moist.  Eyes: Conjunctivae are normal. Pupils are equal, round, and reactive to light.  Neck: Neck supple. No JVD present.  Cardiovascular: Normal rate, regular rhythm, normal heart sounds and intact distal pulses.   Pulmonary/Chest: Effort normal and breath sounds normal. No  respiratory distress. She has no wheezes. She has no rales.   Abdominal: Soft. Bowel sounds are normal. She exhibits no distension and no mass. There is no tenderness. There is no rebound and no guarding.  Musculoskeletal: Normal range of motion. She exhibits tenderness.  Does lean left; tenderness of knees, fingers  Lymphadenopathy:    She has no cervical adenopathy.  Neurological: She is alert and oriented to person, place, and time.  Skin: Skin is warm and dry. There is erythema.  Of perineal area   Psychiatric: She has a normal mood and affect.  Has chronically somewhat flat affect    Labs reviewed: Basic Metabolic Panel:  Recent Labs  16/02/9604/08/17 1959 10/13/15 0851 10/13/15 0914  NA 137 139 140  K 3.8 4.0 3.9  CL  --  102 100*  CO2  --  27  --   GLUCOSE  --  112* 107*  BUN 22* 17 19  CREATININE 0.7 0.82 0.80  CALCIUM  --  9.4  --    Liver Function Tests:  Recent Labs  09/28/15 10/13/15 0851  AST 14 16  ALT 13 18  ALKPHOS 59 60  BILITOT  --  0.6  PROT  --  6.8  ALBUMIN  --  3.7   No results for input(s): LIPASE, AMYLASE in the last 8760 hours. No results for input(s): AMMONIA in the last 8760 hours. CBC:  Recent Labs  09/28/15 10/01/15 1959 10/13/15 0851 10/13/15 0914  WBC 7.1 6.1 6.1  --   NEUTROABS  --   --  4.7  --   HGB 11.8* 11.8* 13.0 15.0  HCT 36 38 40.3 44.0  MCV  --   --  85.4  --   PLT 227 267 298  --    Cardiac Enzymes: No results for input(s): CKTOTAL, CKMB, CKMBINDEX, TROPONINI in the last 8760 hours. BNP: Invalid input(s): POCBNP Lab Results  Component Value Date   HGBA1C 6.0* 10/14/2015   Lab Results  Component Value Date   TSH 3.16 09/20/2015   No results found for: VITAMINB12 No results found for: FOLATE No results found for: IRON, TIBC, FERRITIN  Imaging and Procedures obtained prior to SNF admission: Dg Chest 2 View  10/13/2015  CLINICAL DATA:  Shortness of breath EXAM: CHEST  2 VIEW COMPARISON:  May 31, 2009 FINDINGS: Cardiomegaly persists. A calcified nodule in the right lung  base is again identified. The hila and mediastinum are unchanged. No pneumothorax. No suspicious pulmonary nodules, masses, or focal infiltrates. IMPRESSION: No active cardiopulmonary disease. Electronically Signed   By: Gerome Samavid  Williams III M.D   On: 10/13/2015 14:26   Mr Maxine GlennMra Head Wo Contrast  10/13/2015  CLINICAL DATA:  Punctate right MCA territory infarcts. EXAM: MRA HEAD WITHOUT CONTRAST TECHNIQUE: Angiographic images of the Circle of Willis were obtained using MRA technique without intravenous contrast. COMPARISON:  MRI brain from the same day. FINDINGS: Minimal atherosclerotic changes are present within the cavernous internal carotid arteries bilaterally. The ICA termini are intact. There is mild narrowing of the proximal right M1 segment, less than 50%. A high-grade stenosis is present in the distal right M1 segment or proximal M2 segment just be on a smaller anterior branch. The left MCA bifurcation is intact. No definite anterior communicating artery is seen. Distal posterior right MCA branch vessels are visualized beyond a high-grade stenosis. The anterior superior MCA branch vessels are markedly diminished. The right vertebral artery is the dominant vessel. The right AICA is dominant. The left  vertebral artery terminates at the PICA. Moderate to severe stenosis is present at the left PICA origin. The vertebrobasilar or junction is fenestrated. The basilar artery is otherwise normal. A high-grade stenosis is present in the proximal left posterior cerebral artery. Moderate distal stenosis is present in the right PCA. IMPRESSION: 1. High-grade stenosis of the distal right M1 segment or proximal posterior right M2 segment. 2. Marked attenuation of the anterior superior right M2 branch. 3. Mild narrowing of the proximal right M1 segment, less than 50%. 4. The left MCA bifurcation is intact. 5. Atherosclerotic changes within a hypoplastic left vertebral artery which terminates at the left PICA with a severe  stenosis at the origin of the left PICA. 6. 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 7. High-grade stenosis of the proximal left posterior cerebral artery. Electronically Signed   By: Marin Roberts M.D.   On: 10/13/2015 17:11   Mr Brain Wo Contrast  10/13/2015  CLINICAL DATA:  Left hand numbness beginning at 10:30 p.m. yesterday. Left facial droop in leaning to the left notice today. Left hand numbness has improved. EXAM: MRI HEAD WITHOUT CONTRAST TECHNIQUE: Multiplanar, multiecho pulse sequences of the brain and surrounding structures were obtained without intravenous contrast. COMPARISON:  CT head without contrast 10/01/2015. FINDINGS: 2 punctate foci of acute nonhemorrhagic infarction are present in the posterior right frontal lobe along the precentral gyrus. No other areas of acute or subacute infarction are present. No acute hemorrhage or mass lesion is evident. Advanced atrophy and diffuse white matter disease is present bilaterally. Remote cortical infarcts are present in the right pre frontal gyrus. The ventricles are proportionate to the degree of atrophy. Multiple remote lacunar infarcts are present in the basal ganglia bilaterally. A remote right paramedian pontine infarct is present. White matter changes extend into the central pons. The cerebellum is normal. Flow is present in the major intracranial arteries. A right lens replacement is noted. The globes and orbits are otherwise intact. The paranasal sinuses and mastoid air cells are clear. The skullbase is normal.  Midline sagittal images are unremarkable. IMPRESSION: 1. Two punctate acute nonhemorrhagic infarcts within the posterior right frontal lobe along the precentral gyrus. These may be related to acute motor symptoms. 2. Advanced atrophy and diffuse white matter disease compatible with chronic microvascular ischemia. 3. Remote lacunar infarcts of the basal ganglia and brainstem. Electronically Signed   By: Marin Roberts  M.D.   On: 10/13/2015 11:51    Assessment/Plan 1. Acute CVA (cerebrovascular accident) (HCC) -per some of the notes, but has been having symptom of leaning left for several weeks--pretty much since she moved rooms in assisted living -cont secondary prevention as she tolerates - this has led to a recognition of her functional decline and she will now move to SNF upon discharge from rehab -denies left hand numbness now and has h/o CTS diagnosis  2. Carotid stenosis, unspecified laterality -was known and pt wanted nothing done for any of her problems and wanted to be left alone -cont secondary stroke prevention  3. PVD (peripheral vascular disease) (HCC) -also known, again, cont secondary prevention  4. Constipation, unspecified constipation type -cont bowel regimen, encourage hydration which has been a longstanding problem for her and encourage activity  5. Hypothyroidism, unspecified hypothyroidism type -subclinical and not on meds, monitor  6. Other polyneuropathy (HCC) -had previously tried her on meds for this including gabapentin and lyrica which made her too sleepy and she could not tolerate them  7. Gait disorder -cont PT, OT  -  will need SNF care now as incontinence and ability to get to restroom has declined  8. Urge incontinence of urine -has been ongoing (not a UTI) -cont to hydrate and take frequent trips to restroom -cont myrbetriq which helped some initially when she was ambulatory--might d/c if moves to skilled due to lack of benefit there  Family/ staff Communication: discussed with rehab nursing  Labs/tests ordered:  F/u with neuro Dr. Roda Shutters in 2 mos; NOTE THAT BABY ASA addition is for 3 wks, not permanent and will see if she can tolerate the lipitor now (if problems with cramps and myalgias, d/c)--she is 89 and has not wanted aggressive treatments.

## 2015-10-17 ENCOUNTER — Encounter: Payer: Self-pay | Admitting: Internal Medicine

## 2015-10-17 ENCOUNTER — Encounter: Payer: Medicare Other | Admitting: Internal Medicine

## 2015-10-17 DIAGNOSIS — R278 Other lack of coordination: Secondary | ICD-10-CM | POA: Diagnosis not present

## 2015-10-17 DIAGNOSIS — I638 Other cerebral infarction: Secondary | ICD-10-CM | POA: Diagnosis not present

## 2015-10-17 DIAGNOSIS — M6281 Muscle weakness (generalized): Secondary | ICD-10-CM | POA: Diagnosis not present

## 2015-10-17 DIAGNOSIS — J449 Chronic obstructive pulmonary disease, unspecified: Secondary | ICD-10-CM | POA: Diagnosis not present

## 2015-10-17 DIAGNOSIS — Z9181 History of falling: Secondary | ICD-10-CM | POA: Diagnosis not present

## 2015-10-17 DIAGNOSIS — G609 Hereditary and idiopathic neuropathy, unspecified: Secondary | ICD-10-CM | POA: Diagnosis not present

## 2015-10-17 DIAGNOSIS — R488 Other symbolic dysfunctions: Secondary | ICD-10-CM | POA: Diagnosis not present

## 2015-10-17 DIAGNOSIS — R2689 Other abnormalities of gait and mobility: Secondary | ICD-10-CM | POA: Diagnosis not present

## 2015-10-17 DIAGNOSIS — I69391 Dysphagia following cerebral infarction: Secondary | ICD-10-CM | POA: Diagnosis not present

## 2015-10-17 DIAGNOSIS — I69311 Memory deficit following cerebral infarction: Secondary | ICD-10-CM | POA: Diagnosis not present

## 2015-10-17 DIAGNOSIS — M170 Bilateral primary osteoarthritis of knee: Secondary | ICD-10-CM | POA: Diagnosis not present

## 2015-10-18 DIAGNOSIS — G609 Hereditary and idiopathic neuropathy, unspecified: Secondary | ICD-10-CM | POA: Diagnosis not present

## 2015-10-18 DIAGNOSIS — M6281 Muscle weakness (generalized): Secondary | ICD-10-CM | POA: Diagnosis not present

## 2015-10-18 DIAGNOSIS — R2689 Other abnormalities of gait and mobility: Secondary | ICD-10-CM | POA: Diagnosis not present

## 2015-10-18 DIAGNOSIS — I69311 Memory deficit following cerebral infarction: Secondary | ICD-10-CM | POA: Diagnosis not present

## 2015-10-18 DIAGNOSIS — R278 Other lack of coordination: Secondary | ICD-10-CM | POA: Diagnosis not present

## 2015-10-18 DIAGNOSIS — R488 Other symbolic dysfunctions: Secondary | ICD-10-CM | POA: Diagnosis not present

## 2015-10-19 DIAGNOSIS — R488 Other symbolic dysfunctions: Secondary | ICD-10-CM | POA: Diagnosis not present

## 2015-10-19 DIAGNOSIS — R278 Other lack of coordination: Secondary | ICD-10-CM | POA: Diagnosis not present

## 2015-10-19 DIAGNOSIS — M6281 Muscle weakness (generalized): Secondary | ICD-10-CM | POA: Diagnosis not present

## 2015-10-19 DIAGNOSIS — I69311 Memory deficit following cerebral infarction: Secondary | ICD-10-CM | POA: Diagnosis not present

## 2015-10-19 DIAGNOSIS — R2689 Other abnormalities of gait and mobility: Secondary | ICD-10-CM | POA: Diagnosis not present

## 2015-10-19 DIAGNOSIS — G609 Hereditary and idiopathic neuropathy, unspecified: Secondary | ICD-10-CM | POA: Diagnosis not present

## 2015-10-22 DIAGNOSIS — M6281 Muscle weakness (generalized): Secondary | ICD-10-CM | POA: Diagnosis not present

## 2015-10-22 DIAGNOSIS — I69311 Memory deficit following cerebral infarction: Secondary | ICD-10-CM | POA: Diagnosis not present

## 2015-10-22 DIAGNOSIS — R2689 Other abnormalities of gait and mobility: Secondary | ICD-10-CM | POA: Diagnosis not present

## 2015-10-22 DIAGNOSIS — R488 Other symbolic dysfunctions: Secondary | ICD-10-CM | POA: Diagnosis not present

## 2015-10-22 DIAGNOSIS — G609 Hereditary and idiopathic neuropathy, unspecified: Secondary | ICD-10-CM | POA: Diagnosis not present

## 2015-10-22 DIAGNOSIS — R278 Other lack of coordination: Secondary | ICD-10-CM | POA: Diagnosis not present

## 2015-10-23 DIAGNOSIS — G609 Hereditary and idiopathic neuropathy, unspecified: Secondary | ICD-10-CM | POA: Diagnosis not present

## 2015-10-23 DIAGNOSIS — M6281 Muscle weakness (generalized): Secondary | ICD-10-CM | POA: Diagnosis not present

## 2015-10-23 DIAGNOSIS — I69311 Memory deficit following cerebral infarction: Secondary | ICD-10-CM | POA: Diagnosis not present

## 2015-10-23 DIAGNOSIS — R278 Other lack of coordination: Secondary | ICD-10-CM | POA: Diagnosis not present

## 2015-10-23 DIAGNOSIS — R488 Other symbolic dysfunctions: Secondary | ICD-10-CM | POA: Diagnosis not present

## 2015-10-23 DIAGNOSIS — R2689 Other abnormalities of gait and mobility: Secondary | ICD-10-CM | POA: Diagnosis not present

## 2015-10-24 DIAGNOSIS — G609 Hereditary and idiopathic neuropathy, unspecified: Secondary | ICD-10-CM | POA: Diagnosis not present

## 2015-10-24 DIAGNOSIS — R278 Other lack of coordination: Secondary | ICD-10-CM | POA: Diagnosis not present

## 2015-10-24 DIAGNOSIS — M6281 Muscle weakness (generalized): Secondary | ICD-10-CM | POA: Diagnosis not present

## 2015-10-24 DIAGNOSIS — R2689 Other abnormalities of gait and mobility: Secondary | ICD-10-CM | POA: Diagnosis not present

## 2015-10-24 DIAGNOSIS — I69311 Memory deficit following cerebral infarction: Secondary | ICD-10-CM | POA: Diagnosis not present

## 2015-10-24 DIAGNOSIS — R488 Other symbolic dysfunctions: Secondary | ICD-10-CM | POA: Diagnosis not present

## 2015-10-25 DIAGNOSIS — R2689 Other abnormalities of gait and mobility: Secondary | ICD-10-CM | POA: Diagnosis not present

## 2015-10-25 DIAGNOSIS — Z9181 History of falling: Secondary | ICD-10-CM | POA: Diagnosis not present

## 2015-10-25 DIAGNOSIS — G609 Hereditary and idiopathic neuropathy, unspecified: Secondary | ICD-10-CM | POA: Diagnosis not present

## 2015-10-25 DIAGNOSIS — I638 Other cerebral infarction: Secondary | ICD-10-CM | POA: Diagnosis not present

## 2015-10-25 DIAGNOSIS — M170 Bilateral primary osteoarthritis of knee: Secondary | ICD-10-CM | POA: Diagnosis not present

## 2015-10-25 DIAGNOSIS — I69311 Memory deficit following cerebral infarction: Secondary | ICD-10-CM | POA: Diagnosis not present

## 2015-10-25 DIAGNOSIS — R278 Other lack of coordination: Secondary | ICD-10-CM | POA: Diagnosis not present

## 2015-10-25 DIAGNOSIS — J449 Chronic obstructive pulmonary disease, unspecified: Secondary | ICD-10-CM | POA: Diagnosis not present

## 2015-10-25 DIAGNOSIS — R488 Other symbolic dysfunctions: Secondary | ICD-10-CM | POA: Diagnosis not present

## 2015-10-25 DIAGNOSIS — M6281 Muscle weakness (generalized): Secondary | ICD-10-CM | POA: Diagnosis not present

## 2015-10-25 DIAGNOSIS — I69391 Dysphagia following cerebral infarction: Secondary | ICD-10-CM | POA: Diagnosis not present

## 2015-10-26 DIAGNOSIS — R488 Other symbolic dysfunctions: Secondary | ICD-10-CM | POA: Diagnosis not present

## 2015-10-26 DIAGNOSIS — G609 Hereditary and idiopathic neuropathy, unspecified: Secondary | ICD-10-CM | POA: Diagnosis not present

## 2015-10-26 DIAGNOSIS — R2689 Other abnormalities of gait and mobility: Secondary | ICD-10-CM | POA: Diagnosis not present

## 2015-10-26 DIAGNOSIS — I69311 Memory deficit following cerebral infarction: Secondary | ICD-10-CM | POA: Diagnosis not present

## 2015-10-26 DIAGNOSIS — M6281 Muscle weakness (generalized): Secondary | ICD-10-CM | POA: Diagnosis not present

## 2015-10-26 DIAGNOSIS — R278 Other lack of coordination: Secondary | ICD-10-CM | POA: Diagnosis not present

## 2015-10-29 DIAGNOSIS — G609 Hereditary and idiopathic neuropathy, unspecified: Secondary | ICD-10-CM | POA: Diagnosis not present

## 2015-10-29 DIAGNOSIS — R278 Other lack of coordination: Secondary | ICD-10-CM | POA: Diagnosis not present

## 2015-10-29 DIAGNOSIS — R488 Other symbolic dysfunctions: Secondary | ICD-10-CM | POA: Diagnosis not present

## 2015-10-29 DIAGNOSIS — R2689 Other abnormalities of gait and mobility: Secondary | ICD-10-CM | POA: Diagnosis not present

## 2015-10-29 DIAGNOSIS — I69311 Memory deficit following cerebral infarction: Secondary | ICD-10-CM | POA: Diagnosis not present

## 2015-10-29 DIAGNOSIS — M6281 Muscle weakness (generalized): Secondary | ICD-10-CM | POA: Diagnosis not present

## 2015-10-30 DIAGNOSIS — G609 Hereditary and idiopathic neuropathy, unspecified: Secondary | ICD-10-CM | POA: Diagnosis not present

## 2015-10-30 DIAGNOSIS — M6281 Muscle weakness (generalized): Secondary | ICD-10-CM | POA: Diagnosis not present

## 2015-10-30 DIAGNOSIS — R488 Other symbolic dysfunctions: Secondary | ICD-10-CM | POA: Diagnosis not present

## 2015-10-30 DIAGNOSIS — R278 Other lack of coordination: Secondary | ICD-10-CM | POA: Diagnosis not present

## 2015-10-30 DIAGNOSIS — I69311 Memory deficit following cerebral infarction: Secondary | ICD-10-CM | POA: Diagnosis not present

## 2015-10-30 DIAGNOSIS — R2689 Other abnormalities of gait and mobility: Secondary | ICD-10-CM | POA: Diagnosis not present

## 2015-10-31 DIAGNOSIS — I69311 Memory deficit following cerebral infarction: Secondary | ICD-10-CM | POA: Diagnosis not present

## 2015-10-31 DIAGNOSIS — R2689 Other abnormalities of gait and mobility: Secondary | ICD-10-CM | POA: Diagnosis not present

## 2015-10-31 DIAGNOSIS — R488 Other symbolic dysfunctions: Secondary | ICD-10-CM | POA: Diagnosis not present

## 2015-10-31 DIAGNOSIS — M6281 Muscle weakness (generalized): Secondary | ICD-10-CM | POA: Diagnosis not present

## 2015-10-31 DIAGNOSIS — R278 Other lack of coordination: Secondary | ICD-10-CM | POA: Diagnosis not present

## 2015-10-31 DIAGNOSIS — G609 Hereditary and idiopathic neuropathy, unspecified: Secondary | ICD-10-CM | POA: Diagnosis not present

## 2015-11-01 DIAGNOSIS — R2689 Other abnormalities of gait and mobility: Secondary | ICD-10-CM | POA: Diagnosis not present

## 2015-11-01 DIAGNOSIS — R278 Other lack of coordination: Secondary | ICD-10-CM | POA: Diagnosis not present

## 2015-11-01 DIAGNOSIS — G609 Hereditary and idiopathic neuropathy, unspecified: Secondary | ICD-10-CM | POA: Diagnosis not present

## 2015-11-01 DIAGNOSIS — I69311 Memory deficit following cerebral infarction: Secondary | ICD-10-CM | POA: Diagnosis not present

## 2015-11-01 DIAGNOSIS — M6281 Muscle weakness (generalized): Secondary | ICD-10-CM | POA: Diagnosis not present

## 2015-11-01 DIAGNOSIS — R488 Other symbolic dysfunctions: Secondary | ICD-10-CM | POA: Diagnosis not present

## 2015-11-02 DIAGNOSIS — R278 Other lack of coordination: Secondary | ICD-10-CM | POA: Diagnosis not present

## 2015-11-02 DIAGNOSIS — M6281 Muscle weakness (generalized): Secondary | ICD-10-CM | POA: Diagnosis not present

## 2015-11-02 DIAGNOSIS — R488 Other symbolic dysfunctions: Secondary | ICD-10-CM | POA: Diagnosis not present

## 2015-11-02 DIAGNOSIS — G609 Hereditary and idiopathic neuropathy, unspecified: Secondary | ICD-10-CM | POA: Diagnosis not present

## 2015-11-02 DIAGNOSIS — I69311 Memory deficit following cerebral infarction: Secondary | ICD-10-CM | POA: Diagnosis not present

## 2015-11-02 DIAGNOSIS — R2689 Other abnormalities of gait and mobility: Secondary | ICD-10-CM | POA: Diagnosis not present

## 2015-11-05 DIAGNOSIS — G609 Hereditary and idiopathic neuropathy, unspecified: Secondary | ICD-10-CM | POA: Diagnosis not present

## 2015-11-05 DIAGNOSIS — R2689 Other abnormalities of gait and mobility: Secondary | ICD-10-CM | POA: Diagnosis not present

## 2015-11-05 DIAGNOSIS — R488 Other symbolic dysfunctions: Secondary | ICD-10-CM | POA: Diagnosis not present

## 2015-11-05 DIAGNOSIS — M6281 Muscle weakness (generalized): Secondary | ICD-10-CM | POA: Diagnosis not present

## 2015-11-05 DIAGNOSIS — I69311 Memory deficit following cerebral infarction: Secondary | ICD-10-CM | POA: Diagnosis not present

## 2015-11-05 DIAGNOSIS — R278 Other lack of coordination: Secondary | ICD-10-CM | POA: Diagnosis not present

## 2015-11-06 DIAGNOSIS — R488 Other symbolic dysfunctions: Secondary | ICD-10-CM | POA: Diagnosis not present

## 2015-11-06 DIAGNOSIS — R278 Other lack of coordination: Secondary | ICD-10-CM | POA: Diagnosis not present

## 2015-11-06 DIAGNOSIS — G609 Hereditary and idiopathic neuropathy, unspecified: Secondary | ICD-10-CM | POA: Diagnosis not present

## 2015-11-06 DIAGNOSIS — I69311 Memory deficit following cerebral infarction: Secondary | ICD-10-CM | POA: Diagnosis not present

## 2015-11-06 DIAGNOSIS — M6281 Muscle weakness (generalized): Secondary | ICD-10-CM | POA: Diagnosis not present

## 2015-11-06 DIAGNOSIS — R2689 Other abnormalities of gait and mobility: Secondary | ICD-10-CM | POA: Diagnosis not present

## 2015-11-07 DIAGNOSIS — R278 Other lack of coordination: Secondary | ICD-10-CM | POA: Diagnosis not present

## 2015-11-07 DIAGNOSIS — R488 Other symbolic dysfunctions: Secondary | ICD-10-CM | POA: Diagnosis not present

## 2015-11-07 DIAGNOSIS — R2689 Other abnormalities of gait and mobility: Secondary | ICD-10-CM | POA: Diagnosis not present

## 2015-11-07 DIAGNOSIS — M6281 Muscle weakness (generalized): Secondary | ICD-10-CM | POA: Diagnosis not present

## 2015-11-07 DIAGNOSIS — G609 Hereditary and idiopathic neuropathy, unspecified: Secondary | ICD-10-CM | POA: Diagnosis not present

## 2015-11-07 DIAGNOSIS — I69311 Memory deficit following cerebral infarction: Secondary | ICD-10-CM | POA: Diagnosis not present

## 2015-11-08 DIAGNOSIS — I69311 Memory deficit following cerebral infarction: Secondary | ICD-10-CM | POA: Diagnosis not present

## 2015-11-08 DIAGNOSIS — I69391 Dysphagia following cerebral infarction: Secondary | ICD-10-CM | POA: Diagnosis not present

## 2015-11-08 DIAGNOSIS — I638 Other cerebral infarction: Secondary | ICD-10-CM | POA: Diagnosis not present

## 2015-11-08 DIAGNOSIS — R488 Other symbolic dysfunctions: Secondary | ICD-10-CM | POA: Diagnosis not present

## 2015-11-12 ENCOUNTER — Encounter: Payer: Self-pay | Admitting: Adult Health

## 2015-11-12 ENCOUNTER — Non-Acute Institutional Stay (SKILLED_NURSING_FACILITY): Payer: Medicare Other | Admitting: Adult Health

## 2015-11-12 DIAGNOSIS — M1712 Unilateral primary osteoarthritis, left knee: Secondary | ICD-10-CM | POA: Diagnosis not present

## 2015-11-12 DIAGNOSIS — I679 Cerebrovascular disease, unspecified: Secondary | ICD-10-CM

## 2015-11-12 DIAGNOSIS — G6289 Other specified polyneuropathies: Secondary | ICD-10-CM | POA: Diagnosis not present

## 2015-11-12 DIAGNOSIS — K5900 Constipation, unspecified: Secondary | ICD-10-CM | POA: Diagnosis not present

## 2015-11-12 DIAGNOSIS — I10 Essential (primary) hypertension: Secondary | ICD-10-CM | POA: Diagnosis not present

## 2015-11-12 DIAGNOSIS — E785 Hyperlipidemia, unspecified: Secondary | ICD-10-CM | POA: Diagnosis not present

## 2015-11-12 NOTE — Progress Notes (Signed)
Patient ID: Tara Walters, female   DOB: 06-27-1921, 82 y.o.   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 Service:    SNF Provider:   Peggye Ley, ANP [facility] [phone]   Bufford Spikes, DO  Patient Care Team: [name], DO as PCP - General (Geriatric Medicine) [facility] Fletcher Anon, NP as Nurse Practitioner (Nurse Practitioner)  Extended Emergency Contact Information Primary Emergency Contact: Rogelio Seen [address] [address] of [place] Home Phone: [phone] Mobile Phone: [phone] Relation: Daughter Secondary Emergency Contact: Reed,Tilden  [address] Home Phone: [phone] Mobile Phone: [phone] Relation: Relative   Code Status:  DNR Goals of care: Advanced Directive information Advanced Directives 10/16/2015  Does patient have an advance directive? Yes  Type of Estate agent of [place];Living will  Does patient want to make changes to advanced directive? -  Copy of advanced directive(s) in chart? Yes  Pre-existing out of facility DNR order (yellow form or pink MOST form) Yellow form placed in chart (order not valid for inpatient use)     Chief Complaint  Patient presents with  . Medical Management of Chronic Issues    HPI:  Pt is a 82 y.o. female seen today for medical management of chronic diseases.  She was admitted to skilled care on 6/16 due to an overall functional decline.  She was admitted to the hospital in May due to left hand numbness and was leaning to the left.  There was some concern for peripheral nerve compression syndrome as the cause but in addition she was diagnosed with an acute CVA with MRI showing MRI of the brain shows punctate areas of acute nonhemorrhagic infarction in the posterior right frontal lobe as well as remote lacunar infarcts of the basal ganglia and brainstem. She has denied any further  hand numbness. She continues with mild weakness to the left hand and left leg. She now requires assistance for ambulation, bathing, dressing, etc.  She was started on lipitor during her hospitalization and denies any muscle pain today.   She continues to aspirin and plavix with no overt bleeding noted. Weight fluctuates, currently at 161 lbs. Has a hx of neuropathy but denies any pain to her legs today. Reports some neck pain and states this is long standing due to OA.  No claudication noted but does ambulate much any more.  Past Medical History  Diagnosis Date  . Hypertension   . Anxiety   . Abnormality of gait   . Irritable bowel syndrome 03/11/2010  . Urge urinary incontinence   . Allergic rhinitis   . COPD (chronic obstructive pulmonary disease) (HCC)   . Diverticulosis of colon   . Peripheral vascular disease (HCC)   . Carotid stenosis, right     asymptomatic  <40%  right  ica  and right eca  . PAD (peripheral artery disease) (HCC) monitored by dr Rubye Oaks    moderate  . Claudication, intermittent (HCC)   . Internal hemorrhoids   . History of esophagitis   . History of hypothyroidism   . Arthritis   . Unspecified hereditary and idiopathic peripheral neuropathy   . Carpal tunnel syndrome on both sides   . Fracture, calcaneus closed    Past Surgical History  Procedure Laterality Date  . Carotid endarterectomy Left 11-18-2007  . Dilation and curettage of uterus  1973  . Colonoscopy  2005    mild diverticulosis  . Cataract extraction w/ intraocular lens implant Right 11/2012  . Cystoscopy with injection N/A 09/06/2013    Procedure: CYSTOSCOPY WITH BOTOX  INJECTION;  Surgeon: Martina Sinner, MD;  Location: [facility];  Service: Urology;  Laterality: N/A;    Allergies  Allergen Reactions  . Tramadol Nausea Only      Medication List       This list is accurate as of: 11/12/15  4:43 PM.  Always use your most recent med list.                acetaminophen 325 MG tablet  Commonly known as:  TYLENOL  Take 650 mg by mouth See admin instructions. Take 2 tablets (650 mg) by mouth 3 times daily, may also take 2 tablets (650 mg) every 6 hours as needed for pain     amLODipine 10 MG tablet  Commonly known as:  NORVASC  Take 10 mg by mouth daily. for blood pressure     aspirin EC 81 MG tablet  Take 1 tablet (81 mg total) by mouth daily.     atorvastatin 20 MG tablet  Commonly known as:  LIPITOR  Take 1 tablet (20 mg total) by mouth daily at 6 PM.     clopidogrel 75 MG tablet  Commonly known as:  PLAVIX  Take 75 mg by mouth daily.     eszopiclone 2 MG Tabs tablet  Commonly known as:  LUNESTA  Take 1 tablet (2 mg total) by mouth at bedtime. Take immediately before bedtime     famotidine 20 MG tablet  Commonly known as:  PEPCID  Take 20 mg by mouth 2 (two) times daily.     loratadine 10 MG tablet  Commonly known as:  CLARITIN  Take 10 mg by mouth daily.     MINTOX 200-200-20 MG/5ML suspension  Generic drug:  alum & mag hydroxide-simeth  Take 15 mLs by mouth 2 (two) times daily as needed for indigestion or heartburn.     MYRBETRIQ 50 MG Tb24 tablet  Generic drug:  mirabegron ER  Take 100 mg by mouth at bedtime.     nystatin cream  Commonly known as:  MYCOSTATIN  Apply 1 application topically 2 (two) times daily. Apply to per area twice daily until erythema resolved     pantoprazole 40 MG tablet  Commonly known as:  PROTONIX  Take 40 mg by mouth daily.     polyethylene glycol powder powder  Commonly known as:  GLYCOLAX/MIRALAX  Take 17 g by mouth daily as needed (constipation). Mix in 8 oz liquid and drink     SENNA S 8.6-50 MG tablet  Generic drug:  senna-docusate  Take 2 tablets by mouth at bedtime.     sodium chloride 0.65 % Soln nasal spray  Commonly known as:  OCEAN  Place 1 spray into both nostrils 2 (two) times daily.     SUPER B COMPLEX/C PO  Take 2 tablets by mouth daily.     SYSTANE 0.4-0.3 % Gel  ophthalmic gel  Generic drug:  Polyethyl Glycol-Propyl Glycol  Place 1 application into both eyes 4 (four) times daily.        Review of Systems  Constitutional: Positive for activity change. Negative for fever, chills, diaphoresis, appetite change, fatigue and unexpected weight change.  HENT: Negative for congestion.   Respiratory: Negative for cough, shortness of breath and wheezing.   Cardiovascular: Negative for chest pain, palpitations  and leg swelling.  Gastrointestinal: Negative for abdominal pain, diarrhea, constipation and abdominal distention.  Genitourinary: Negative for dysuria and difficulty urinating.  Musculoskeletal: Positive for arthralgias and gait problem. Negative for myalgias, back pain and joint swelling.  Neurological: Positive for weakness. Negative for dizziness, tremors, seizures, syncope, facial asymmetry, speech difficulty, light-headedness, numbness and headaches.  Psychiatric/Behavioral: Positive for confusion. Negative for behavioral problems and agitation.    Immunization History  Administered Date(s) Administered  . Influenza-Unspecified 03/09/2013, 03/01/2014, 03/08/2015  . PPD Test 12/04/2011  . Pneumococcal Conjugate-13 09/12/2014  . Pneumococcal Polysaccharide-23 09/12/2015   Pertinent  Health Maintenance Due  Topic Date Due  . INFLUENZA VACCINE  12/25/2015  . PNA vac Low Risk Adult  Completed   Fall Risk  09/12/2015 03/21/2015 03/13/2014 02/28/2013  Falls in the past year? No No No Yes  Number falls in past yr: - - - 1  Injury with Fall? - - - Yes   Functional Status Survey:   Wt Readings from Last 3 Encounters:  11/12/15 161 lb 12.8 oz (73.392 kg)  10/16/15 171 lb (77.565 kg)  10/14/15 164 lb 7.4 oz (74.6 kg)    Physical Exam  Constitutional: She is oriented to person, place, and time. No distress.  HENT:  Head: Normocephalic and atraumatic.  Neck: No JVD present.  Cardiovascular: Normal rate and regular rhythm.   No murmur  heard. No edema  Pulmonary/Chest: Effort normal and breath sounds normal. No respiratory distress. She has no wheezes.  Bronchial BS throughout  Abdominal: Soft. Bowel sounds are normal.  Musculoskeletal: She exhibits no edema or tenderness.  LUE 4/5, LLE 3/5.  RUE 5/5 RLE 5/5.  Crepitus noted to the left knee  Neurological: She is alert and oriented to person, place, and time.  Skin: Skin is warm and dry. She is not diaphoretic.  Psychiatric: She has a normal mood and affect.  Nursing note and vitals reviewed.   Labs reviewed:  Recent Labs  10/01/15 1959 10/13/15 0851 10/13/15 0914  NA 137 139 140  K 3.8 4.0 3.9  CL  --  102 100*  CO2  --  27  --   GLUCOSE  --  112* 107*  BUN 22* 17 19  CREATININE 0.7 0.82 0.80  CALCIUM  --  9.4  --     Recent Labs  09/28/15 10/13/15 0851  AST 14 16  ALT 13 18  ALKPHOS 59 60  BILITOT  --  0.6  PROT  --  6.8  ALBUMIN  --  3.7    Recent Labs  09/28/15 10/01/15 1959 10/13/15 0851 10/13/15 0914  WBC 7.1 6.1 6.1  --   NEUTROABS  --   --  4.7  --   HGB 11.8* 11.8* 13.0 15.0  HCT 36 38 40.3 44.0  MCV  --   --  85.4  --   PLT 227 267 298  --    Lab Results  Component Value Date   TSH 3.16 09/20/2015   Lab Results  Component Value Date   HGBA1C 6.0* 10/14/2015   Lab Results  Component Value Date   CHOL 181 10/14/2015   HDL 57 10/14/2015   LDLCALC 108* 10/14/2015   TRIG 79 10/14/2015   CHOLHDL 3.2 10/14/2015    Significant Diagnostic Results in last 30 days:  No results found.  Assessment/Plan 1. Cerebrovascular disease Continues with mild left sided weakness, overall improved from admission to rehab Continue plavix Continue ASA to complete 3 months of therapy  2. Other polyneuropathy (HCC) Denies pain, has not tolerated meds for this in the past  3. Essential hypertension controlled  4. Constipation, unspecified constipation type Continue senna s  5. HLD (hyperlipidemia) Continue lipitor but would d/c  if myalgias present  6. Primary osteoarthritis of left knee Stable Continue tylenol  She seems to be adjusting well to her new room in skilled care.  Family/ staff Communication: discussed with staff, resident  Labs/tests ordered:  NA  Peggye Leyhristy Coran Dipaola, ANP [facility] [phone]

## 2015-11-13 DIAGNOSIS — I638 Other cerebral infarction: Secondary | ICD-10-CM | POA: Diagnosis not present

## 2015-11-13 DIAGNOSIS — I69311 Memory deficit following cerebral infarction: Secondary | ICD-10-CM | POA: Diagnosis not present

## 2015-11-13 DIAGNOSIS — I69391 Dysphagia following cerebral infarction: Secondary | ICD-10-CM | POA: Diagnosis not present

## 2015-11-13 DIAGNOSIS — R488 Other symbolic dysfunctions: Secondary | ICD-10-CM | POA: Diagnosis not present

## 2015-11-14 DIAGNOSIS — I69311 Memory deficit following cerebral infarction: Secondary | ICD-10-CM | POA: Diagnosis not present

## 2015-11-14 DIAGNOSIS — R488 Other symbolic dysfunctions: Secondary | ICD-10-CM | POA: Diagnosis not present

## 2015-11-14 DIAGNOSIS — I638 Other cerebral infarction: Secondary | ICD-10-CM | POA: Diagnosis not present

## 2015-11-14 DIAGNOSIS — I69391 Dysphagia following cerebral infarction: Secondary | ICD-10-CM | POA: Diagnosis not present

## 2015-11-26 ENCOUNTER — Other Ambulatory Visit: Payer: Self-pay

## 2015-11-26 DIAGNOSIS — G4700 Insomnia, unspecified: Secondary | ICD-10-CM

## 2015-11-26 MED ORDER — ESZOPICLONE 2 MG PO TABS: 2.0000 mg | ORAL_TABLET | Freq: Every day | ORAL | Status: DC

## 2015-11-26 NOTE — Telephone Encounter (Signed)
Faxed to [facility] Fax Number: [phone], Phone Number [phone]  

## 2015-12-10 ENCOUNTER — Non-Acute Institutional Stay (SKILLED_NURSING_FACILITY): Payer: Medicare Other | Admitting: Adult Health

## 2015-12-10 ENCOUNTER — Encounter: Payer: Self-pay | Admitting: Adult Health

## 2015-12-10 DIAGNOSIS — I10 Essential (primary) hypertension: Secondary | ICD-10-CM | POA: Diagnosis not present

## 2015-12-10 DIAGNOSIS — J309 Allergic rhinitis, unspecified: Secondary | ICD-10-CM

## 2015-12-10 DIAGNOSIS — M150 Primary generalized (osteo)arthritis: Secondary | ICD-10-CM | POA: Diagnosis not present

## 2015-12-10 DIAGNOSIS — M159 Polyosteoarthritis, unspecified: Secondary | ICD-10-CM

## 2015-12-10 DIAGNOSIS — K219 Gastro-esophageal reflux disease without esophagitis: Secondary | ICD-10-CM

## 2015-12-10 DIAGNOSIS — K5901 Slow transit constipation: Secondary | ICD-10-CM

## 2015-12-10 NOTE — Progress Notes (Signed)
Patient ID: Tara Walters, female   DOB: January 28, 1922, 82 y.o.   MRN: [id number]   [facility]:   [facility] [employer] teacher of Service:  SNF (31)  Provider:   Peggye Ley, ANP [facility] [phone]   REED, Elmarie Shiley, DO  Patient Care Team: [name], DO as PCP - General (Geriatric Medicine) [facility] Fletcher Anon, NP as Nurse Practitioner (Nurse Practitioner)  Extended Emergency Contact Information Primary Emergency Contact: Rogelio Seen [address] [address] of [place] Home Phone: [phone] Mobile Phone: [phone] Relation: Daughter Secondary Emergency Contact: Reed,Tilden  [address] of [place] Home Phone: [phone] Mobile Phone: [phone] Relation: Relative   Code Status:  DNR Goals of care: Advanced Directive information Advanced Directives 10/16/2015  Does patient have an advance directive? Yes  Type of Estate agent of [place];Living will  Does patient want to make changes to advanced directive? -  Copy of advanced directive(s) in chart? Yes  Pre-existing out of facility DNR order (yellow form or pink MOST form) Yellow form placed in chart (order not valid for inpatient use)     Chief Complaint  Patient presents with  . Medical Management of Chronic Issues    HPI:  Pt is a 82 y.o. female seen today for medical management of chronic diseases.  She was admitted to skilled care on 6/16 due to an overall functional decline.  She was admitted to the hospital in May due to left hand numbness and was leaning to the left.  There was some concern for peripheral nerve compression syndrome as the cause but in addition she was diagnosed with an acute CVA with MRI showing MRI of the brain shows punctate areas of acute nonhemorrhagic infarction in the posterior right frontal lobe as well as remote lacunar infarcts of the basal ganglia and brainstem. She feels she is adjusting  well to skilled care.  Reports some pain in her neck and knees which is chronic. BP is well controlled on norvasc. Has periodic constipation and uses senokot and miralax prn.  Has significant reflux and is currently on protonix and pepcid, as well as mintox at supper.   Past Medical History  Diagnosis Date  . Hypertension   . Anxiety   . Abnormality of gait   . Irritable bowel syndrome 03/11/2010  . Urge urinary incontinence   . Allergic rhinitis   . COPD (chronic obstructive pulmonary disease) (HCC)   . Diverticulosis of colon   . Peripheral vascular disease (HCC)   . Carotid stenosis, right     asymptomatic  <40%  right  ica  and right eca  . PAD (peripheral artery disease) (HCC) monitored by dr Rubye Oaks    moderate  . Claudication, intermittent (HCC)   . Internal hemorrhoids   . History of esophagitis   . History of hypothyroidism   . Arthritis   . Unspecified hereditary and idiopathic peripheral neuropathy   . Carpal tunnel syndrome on both sides   . Fracture, calcaneus closed    Past Surgical History  Procedure Laterality Date  . Carotid endarterectomy Left 11-18-2007  . Dilation and curettage of uterus  1973  . Colonoscopy  2005    mild diverticulosis  . Cataract extraction w/ intraocular lens implant Right 11/2012  . Cystoscopy with injection N/A 09/06/2013    Procedure: CYSTOSCOPY WITH BOTOX  INJECTION;  Surgeon: Martina Sinner, MD;  Location: [facility];  Service: Urology;  Laterality: N/A;    Allergies  Allergen Reactions  . Tramadol Nausea Only      Medication List       This list is accurate as of: 12/10/15  4:24 PM.  Always use your most recent med list.               acetaminophen 325 MG tablet  Commonly known as:  TYLENOL  Take 650 mg by mouth See admin instructions. Take 2 tablets (650 mg) by mouth 3 times daily, may also take 2 tablets (650 mg) every 6 hours as needed for pain     amLODipine 10 MG tablet  Commonly known  as:  NORVASC  Take 10 mg by mouth daily. for blood pressure     aspirin EC 81 MG tablet  Take 1 tablet (81 mg total) by mouth daily.     atorvastatin 20 MG tablet  Commonly known as:  LIPITOR  Take 1 tablet (20 mg total) by mouth daily at 6 PM.     clopidogrel 75 MG tablet  Commonly known as:  PLAVIX  Take 75 mg by mouth daily.     eszopiclone 2 MG Tabs tablet  Commonly known as:  LUNESTA  Take 1 tablet (2 mg total) by mouth at bedtime. Take immediately before bedtime     eszopiclone 2 MG Tabs tablet  Commonly known as:  LUNESTA  Take 1 tablet (2 mg total) by mouth at bedtime. Take immediately before bedtime     famotidine 20 MG tablet  Commonly known as:  PEPCID  Take 20 mg by mouth 2 (two) times daily.     loratadine 10 MG tablet  Commonly known as:  CLARITIN  Take 10 mg by mouth daily.     MINTOX 200-200-20 MG/5ML suspension  Generic drug:  alum & mag hydroxide-simeth  Take 15 mLs by mouth 2 (two) times daily as needed for indigestion or heartburn.     MYRBETRIQ 50 MG Tb24 tablet  Generic drug:  mirabegron ER  Take 100 mg by mouth at bedtime.     nystatin cream  Commonly known as:  MYCOSTATIN  Apply 1 application topically 2 (two) times daily. Apply to per area twice daily until erythema resolved     pantoprazole 40 MG tablet  Commonly known as:  PROTONIX  Take 40 mg by mouth daily.     polyethylene glycol powder powder  Commonly known as:  GLYCOLAX/MIRALAX  Take 17 g by mouth daily as needed (constipation). Mix in 8 oz liquid and drink     SENNA S 8.6-50 MG tablet  Generic drug:  senna-docusate  Take 2 tablets by mouth at bedtime.     sodium chloride 0.65 % Soln nasal spray  Commonly known as:  OCEAN  Place 1 spray into both nostrils 2 (two) times daily.     SUPER B COMPLEX/C PO  Take 2 tablets by mouth daily.     SYSTANE 0.4-0.3 % Gel ophthalmic gel  Generic drug:  Polyethyl Glycol-Propyl Glycol  Place 1 application into both eyes 4 (four) times  daily.        Review of Systems  Constitutional: Negative for fever, chills, diaphoresis, activity change, appetite change, fatigue and unexpected weight change.  HENT: Negative for congestion.   Respiratory: Negative for cough, shortness of breath and wheezing.   Cardiovascular: Negative for chest pain, palpitations and leg swelling.  Gastrointestinal: Negative for nausea, vomiting, abdominal pain, diarrhea, constipation and abdominal distention.  Occasional reflux  Genitourinary: Negative for dysuria and difficulty urinating.  Musculoskeletal: Positive for arthralgias and gait problem. Negative for myalgias, back pain and joint swelling.  Neurological: Positive for weakness. Negative for dizziness, tremors, seizures, syncope, facial asymmetry, speech difficulty, light-headedness, numbness and headaches.  Psychiatric/Behavioral: Positive for confusion. Negative for behavioral problems and agitation.    Immunization History  Administered Date(s) Administered  . Influenza-Unspecified 03/09/2013, 03/01/2014, 03/08/2015  . PPD Test 12/04/2011  . Pneumococcal Conjugate-13 09/12/2014  . Pneumococcal Polysaccharide-23 09/12/2015   Pertinent  Health Maintenance Due  Topic Date Due  . INFLUENZA VACCINE  12/25/2015  . PNA vac Low Risk Adult  Completed   Fall Risk  09/12/2015 03/21/2015 03/13/2014 02/28/2013  Falls in the past year? No No No Yes  Number falls in past yr: - - - 1  Injury with Fall? - - - Yes   Functional Status Survey:   Wt Readings from Last 3 Encounters:  12/10/15 161 lb 4.8 oz (73.165 kg)  11/12/15 161 lb 12.8 oz (73.392 kg)  10/16/15 171 lb (77.565 kg)    Physical Exam  Constitutional: She is oriented to person, place, and time. No distress.  HENT:  Head: Normocephalic and atraumatic.  Neck: No JVD present.  Cardiovascular: Normal rate and regular rhythm.   No murmur heard. No edema. No carotid bruit.  Pulmonary/Chest: Effort normal. No respiratory  distress. She has wheezes (bilat mild exp).  Abdominal: Soft. Bowel sounds are normal.  Musculoskeletal: She exhibits no edema or tenderness.  LUE 4/5, LLE 3/5.  RUE 5/5 RLE 5/5.  Crepitus noted to both knees. Decrease ROM to neck. Kyphosis noted.   Neurological: She is alert and oriented to person, place, and time.  Skin: Skin is warm and dry. She is not diaphoretic.  Psychiatric: She has a normal mood and affect.  Nursing note and vitals reviewed.   Labs reviewed:  Recent Labs  10/01/15 1959 10/13/15 0851 10/13/15 0914  NA 137 139 140  K 3.8 4.0 3.9  CL  --  102 100*  CO2  --  27  --   GLUCOSE  --  112* 107*  BUN 22* 17 19  CREATININE 0.7 0.82 0.80  CALCIUM  --  9.4  --     Recent Labs  09/28/15 10/13/15 0851  AST 14 16  ALT 13 18  ALKPHOS 59 60  BILITOT  --  0.6  PROT  --  6.8  ALBUMIN  --  3.7    Recent Labs  09/28/15 10/01/15 1959 10/13/15 0851 10/13/15 0914  WBC 7.1 6.1 6.1  --   NEUTROABS  --   --  4.7  --   HGB 11.8* 11.8* 13.0 15.0  HCT 36 38 40.3 44.0  MCV  --   --  85.4  --   PLT 227 267 298  --    Lab Results  Component Value Date   TSH 3.16 09/20/2015   Lab Results  Component Value Date   HGBA1C 6.0* 10/14/2015   Lab Results  Component Value Date   CHOL 181 10/14/2015   HDL 57 10/14/2015   LDLCALC 108* 10/14/2015   TRIG 79 10/14/2015   CHOLHDL 3.2 10/14/2015    Significant Diagnostic Results in last 30 days:  No results found.  Assessment/Plan  1) Osteoarthritis -Continue scheduled Tylenol -add biofreeze BID and BID prn to affected joints  2) Allergic rhinitis -has been on claritin for quite some and would like to trial off of this -d/c  claritin  3)HTN Controlled  Continue norvasc  4) GERD -controlled with mintox, protonix and pepcid  5) Constipation -Controlled -Continue senkot at bedtime and miralax prn   Family/ staff Communication: discussed with resident  Labs/tests ordered:  NA  Peggye Ley,  ANP [facility] [phone]

## 2015-12-12 ENCOUNTER — Encounter: Payer: Self-pay | Admitting: Internal Medicine

## 2016-01-01 ENCOUNTER — Encounter: Payer: Self-pay | Admitting: Internal Medicine

## 2016-01-01 ENCOUNTER — Non-Acute Institutional Stay (SKILLED_NURSING_FACILITY): Payer: Medicare Other | Admitting: Internal Medicine

## 2016-01-01 DIAGNOSIS — M545 Low back pain, unspecified: Secondary | ICD-10-CM

## 2016-01-01 DIAGNOSIS — R269 Unspecified abnormalities of gait and mobility: Secondary | ICD-10-CM

## 2016-01-01 DIAGNOSIS — N3281 Overactive bladder: Secondary | ICD-10-CM | POA: Diagnosis not present

## 2016-01-01 DIAGNOSIS — G4700 Insomnia, unspecified: Secondary | ICD-10-CM

## 2016-01-01 DIAGNOSIS — I10 Essential (primary) hypertension: Secondary | ICD-10-CM | POA: Diagnosis not present

## 2016-01-01 DIAGNOSIS — K5901 Slow transit constipation: Secondary | ICD-10-CM | POA: Diagnosis not present

## 2016-01-01 NOTE — Progress Notes (Signed)
Patient ID: Tara Walters, female   DOB: 1921/08/30, 82 y.o.   MRN: [id number]  Location:   [facility] Room Number: 120 Place of Service:  SNF (31) Provider: Lautaro Koral L. Renato Gails, D.O., C.M.D.  Bufford Spikes, DO  Patient Care Team: [name], DO as PCP - General (Geriatric Medicine) [facility] Fletcher Anon, NP as Nurse Practitioner (Nurse Practitioner)  Extended Emergency Contact Information Primary Emergency Contact: Rogelio Seen [address] [address] [place] Home Phone: [phone] Mobile Phone: [phone] Relation: Daughter Secondary Emergency Contact: Diamonds Lippard,[place]  [place] Home Phone: [phone] Mobile Phone: [phone] Relation: Relative  Code Status:  DNR Goals of care: Advanced Directive information Advanced Directives 01/01/2016  Does patient have an advance directive? Yes  Type of Advance Directive Out of facility DNR (pink MOST or yellow form);Healthcare Power of [place];Living will  Does patient want to make changes to advanced directive? -  Copy of advanced directive(s) in chart? Yes  Pre-existing out of facility DNR order (yellow form or pink MOST form) Yellow form placed in chart (order not valid for inpatient use)     Chief Complaint  Patient presents with  . Medical Management of Chronic Issues    routine visit    HPI:  Tara Walters is a 82 y.o. female seen today for medical management of chronic diseases.  She c/o more problems with her back pain especially overnight and she's unable to change positions easily in her bed due to weakness.  She has not tried any heat.  Recent stroke vs. Nerve compression:  Sensory losses resolved.  Had some chronic changes also.  On asa, plavix, lipitor.  She seems to be doing better since her move to SNF.    OAB on myrbetriq 50mg  daily  Constipation:  Continues on senna s and miralax.  OA:  Uses biofreeze, tylenol for pain.    HTN:  BP today  slightly elevated with prior strokes.  Remains on amlodipine 10mg  only.  Does not like to have her bp checked so being done weekly.  Her goals are comfort.  Insomnia:  Using lunesta.    Past Medical History:  Diagnosis Date  . Abnormality of gait   . Allergic rhinitis   . Anxiety   . Arthritis   . Carotid stenosis, right    asymptomatic  <40%  right  ica  and right eca  . Carpal tunnel syndrome on both sides   . Claudication, intermittent (HCC)   . COPD (chronic obstructive pulmonary disease) (HCC)   . Diverticulosis of colon   . Fracture, calcaneus closed   . History of esophagitis   . History of hypothyroidism   . Hypertension   . Internal hemorrhoids   . Irritable bowel syndrome 03/11/2010  . PAD (peripheral artery disease) (HCC) monitored by dr Rubye Oaks   moderate  . Peripheral vascular disease (HCC)   . Unspecified hereditary and idiopathic peripheral neuropathy   . Urge urinary incontinence    Past Surgical History:  Procedure Laterality Date  . CAROTID ENDARTERECTOMY Left 11-18-2007  . CATARACT EXTRACTION W/ INTRAOCULAR LENS IMPLANT Right 11/2012  . COLONOSCOPY  2005   mild diverticulosis  . CYSTOSCOPY WITH INJECTION N/A 09/06/2013   Procedure: CYSTOSCOPY WITH BOTOX  INJECTION;  Surgeon: Martina Sinner, MD;  Location: [facility];  Service: Urology;  Laterality: N/A;  . DILATION AND CURETTAGE OF UTERUS  1973    Allergies  Allergen Reactions  . Tramadol Nausea Only      Medication List       Accurate as of 01/01/16 10:58 AM. Always use your most recent med list.          acetaminophen 325 MG tablet Commonly known as:  TYLENOL Take 650 mg by mouth See admin instructions. Take 2 tablets (650 mg) by mouth 3 times daily, may also take 2 tablets (650 mg) every 6 hours as needed for pain   amLODipine 10 MG tablet Commonly known as:  NORVASC Take 10 mg by mouth daily. for blood pressure   aspirin EC 81 MG tablet Take 1 tablet (81 mg total)  by mouth daily.   atorvastatin 20 MG tablet Commonly known as:  LIPITOR Take 1 tablet (20 mg total) by mouth daily at 6 PM.   BIOFREEZE EX Apply 1 application topically 2 (two) times daily. To neck and knees and twice daily as needed to any affected joint.   clopidogrel 75 MG tablet Commonly known as:  PLAVIX Take 75 mg by mouth daily.   eszopiclone 2 MG Tabs tablet Commonly known as:  LUNESTA Take 1 tablet (2 mg total) by mouth at bedtime. Take immediately before bedtime   loratadine 10 MG tablet Commonly known as:  CLARITIN Take 10 mg by mouth daily.   MINTOX 200-200-20 MG/5ML suspension Generic drug:  alum & mag hydroxide-simeth Take 15 mLs by mouth at bedtime. And prn indigestion   MYRBETRIQ 50 MG Tb24 tablet Generic drug:  mirabegron ER Take 100 mg by mouth at bedtime.   nystatin cream Commonly known as:  MYCOSTATIN Apply 1 application topically 2 (two) times daily. Apply to per area twice daily until erythema resolved   pantoprazole 40 MG tablet Commonly known as:  PROTONIX Take 40 mg by mouth daily.   polyethylene glycol powder powder Commonly known as:  GLYCOLAX/MIRALAX Take 17 g by mouth daily as needed (constipation). Mix in 8 oz liquid and drink   SENNA S 8.6-50 MG tablet Generic drug:  senna-docusate Take 2 tablets by mouth at bedtime.   sodium chloride 0.65 % Soln nasal spray Commonly known as:  OCEAN Place 1 spray into both nostrils 2 (two) times daily.   SUPER B COMPLEX/C PO Take 2 tablets by mouth daily.   SYSTANE 0.4-0.3 % Gel ophthalmic gel Generic drug:  Polyethyl Glycol-Propyl Glycol Place 1 application into both eyes 4 (four) times daily.       Review of Systems  Constitutional: Positive for activity change. Negative for appetite change.       She seems to be less active in activities, but is content  HENT: Negative for congestion.   Eyes: Negative for pain.  Respiratory: Negative for shortness of breath.   Cardiovascular: Negative  for chest pain, palpitations and leg swelling.  Gastrointestinal: Negative for abdominal pain.  Genitourinary: Negative for dysuria and frequency.  Musculoskeletal: Positive for arthralgias, back pain and gait problem.  Skin: Negative for color change.  Neurological: Positive for weakness. Negative for dizziness.  Hematological: Negative for adenopathy.  Psychiatric/Behavioral: Negative for confusion.       Mentation seems improved again     Immunization History  Administered Date(s) Administered  . Influenza-Unspecified 03/09/2013, 03/01/2014, 03/08/2015  . PPD Test 12/04/2011  . Pneumococcal Conjugate-13 09/12/2014  . Pneumococcal Polysaccharide-23 09/12/2015   Pertinent  Health Maintenance Due  Topic Date Due  . INFLUENZA VACCINE  12/25/2015  . PNA vac Low Risk Adult  Completed  Fall Risk  09/12/2015 03/21/2015 03/13/2014 02/28/2013  Falls in the past year? No No No Yes  Number falls in past yr: - - - 1  Injury with Fall? - - - Yes   Functional Status Survey:    Vitals:   01/01/16 1047  BP: (!) 141/76  Pulse: 65  Resp: 16  Temp: 97.4 F (36.3 C)  TempSrc: Oral  SpO2: 96%  Weight: 163 lb (73.9 kg)   Body mass index is 26.71 kg/m. Physical Exam  Constitutional: She appears well-developed and well-nourished. No distress.  Cardiovascular: Normal rate.   Pulmonary/Chest: Effort normal and breath sounds normal.  Abdominal: Soft. Bowel sounds are normal.  Musculoskeletal: Normal range of motion. She exhibits tenderness.  Of lumbar region of back  Neurological: She is alert.  Skin: Skin is warm and dry.  Psychiatric: She has a normal mood and affect.    Labs reviewed:  Recent Labs  10/01/15 1959 10/13/15 0851 10/13/15 0914  NA 137 139 140  K 3.8 4.0 3.9  CL  --  102 100*  CO2  --  27  --   GLUCOSE  --  112* 107*  BUN 22* 17 19  CREATININE 0.7 0.82 0.80  CALCIUM  --  9.4  --     Recent Labs  09/28/15 10/13/15 0851  AST 14 16  ALT 13 18  ALKPHOS  59 60  BILITOT  --  0.6  PROT  --  6.8  ALBUMIN  --  3.7    Recent Labs  09/28/15 10/01/15 1959 10/13/15 0851 10/13/15 0914  WBC 7.1 6.1 6.1  --   NEUTROABS  --   --  4.7  --   HGB 11.8* 11.8* 13.0 15.0  HCT 36 38 40.3 44.0  MCV  --   --  85.4  --   PLT 227 267 298  --    Lab Results  Component Value Date   TSH 3.16 09/20/2015   Lab Results  Component Value Date   HGBA1C 6.0 (H) 10/14/2015   Lab Results  Component Value Date   CHOL 181 10/14/2015   HDL 57 10/14/2015   LDLCALC 108 (H) 10/14/2015   TRIG 79 10/14/2015   CHOLHDL 3.2 10/14/2015   Assessment/Plan 1. Midline low back pain without sciatica -recommended she use a heating pad on medium setting for 20 mins before bed and possibly another time while in the chair during the day to see how she does  2. OAB (overactive bladder) -improved with myrbetriq so cont same  3. Insomnia -doing fine with lunesta--cont same  4. Gait disorder -no longer walking--using wheelchair and chair in room as she has become increasingly frail from her OA and the question of stroke she may have had  5. Essential hypertension -cont weekly bp checks -if readings are over 140/90 consistently, will need med adjustments  6. Slow transit constipation -stable with current regimen, no changes  Family/ staff Communication: discussed with SNF nursing  Labs/tests ordered:  No new

## 2016-01-15 ENCOUNTER — Encounter: Payer: Self-pay | Admitting: Neurology

## 2016-01-15 ENCOUNTER — Ambulatory Visit (INDEPENDENT_AMBULATORY_CARE_PROVIDER_SITE_OTHER): Payer: Medicare Other | Admitting: Neurology

## 2016-01-15 VITALS — BP 132/63 | HR 60 | Ht 65.0 in

## 2016-01-15 DIAGNOSIS — I10 Essential (primary) hypertension: Secondary | ICD-10-CM | POA: Diagnosis not present

## 2016-01-15 DIAGNOSIS — I739 Peripheral vascular disease, unspecified: Secondary | ICD-10-CM | POA: Diagnosis not present

## 2016-01-15 DIAGNOSIS — Z9889 Other specified postprocedural states: Secondary | ICD-10-CM

## 2016-01-15 DIAGNOSIS — I679 Cerebrovascular disease, unspecified: Secondary | ICD-10-CM

## 2016-01-15 DIAGNOSIS — I639 Cerebral infarction, unspecified: Secondary | ICD-10-CM

## 2016-01-15 DIAGNOSIS — I63311 Cerebral infarction due to thrombosis of right middle cerebral artery: Secondary | ICD-10-CM

## 2016-01-15 DIAGNOSIS — E785 Hyperlipidemia, unspecified: Secondary | ICD-10-CM

## 2016-01-15 DIAGNOSIS — R269 Unspecified abnormalities of gait and mobility: Secondary | ICD-10-CM

## 2016-01-15 NOTE — Progress Notes (Signed)
STROKE NEUROLOGY FOLLOW UP NOTE  NAME: Tara Walters DOB: 12-14-1921  REASON FOR VISIT: stroke follow up HISTORY FROM: pt and chart  Today we had the pleasure of seeing Tara Walters in follow-up at our Neurology Clinic. Pt was accompanied by nursing staff from [facility].   History Summary Tara Walters is a 82 y.o. female with history of hypertension, gait abnormality, COPD, PVD, carpal tunnel syndrome, and previous left CEA was admitted on 10/13/15 for left hand numbness and drooping left eye. MRI found to have right two CR/SO punctate infarcts and remote lacunar infarcts b/l BG and brainstem. MRA showed diffuse high grade stenosis in multiple large vessels including right M1/M2, left P1, left PICA, right ICA siphon and right P2. CUS 07/2015 unremarkable, and TTE in 06/2015 EF 65%. LDL 108 and A1C 6.0. She was put on DAPT and lipitor 20 and discharged back to [facility].   Interval History During the interval time, the patient has been doing well. Symptoms all resolved. She has old BLE weakness, L>R and not able to ambulate at baseline before the stroke which has not been changed. BP 132/63. Still on DAPT and no side effect. Otherwise, no complains.     REVIEW OF SYSTEMS: Full 14 system review of systems performed and notable only for those listed below and in HPI above, all others are negative:  Constitutional:   Cardiovascular:  Ear/Nose/Throat:   Skin:  Eyes:   Respiratory:  cough Gastroitestinal:  constipation Genitourinary: frequency of urination Hematology/Lymphatic:   Endocrine:  Musculoskeletal:  Back pain, neck pain Allergy/Immunology:   Neurological:   Psychiatric: agitation, nervous Sleep: insomnia, daytime sleepiness, snoring  The following represents the patient's updated allergies and side effects list: Allergies  Allergen Reactions  . Tramadol Nausea Only    The neurologically relevant items on the patient's problem list were reviewed on today's  visit.  Neurologic Examination  A problem focused neurological exam (12 or more points of the single system neurologic examination, vital signs counts as 1 point, cranial nerves count for 8 points) was performed.  Blood pressure 132/63, pulse 60, height 5\' 5"  (1.651 m).  General - Well nourished, well developed, in no apparent distress.  Ophthalmologic - Fundi not visualized due to noncooperation.  Cardiovascular - Regular rate and rhythm.  Mental Status -  Level of arousal and orientation to place, and person were intact, but not orientated to time. Language including expression, naming, repetition, comprehension was assessed and found intact. Fund of Knowledge was assessed and was impaired.  Cranial Nerves II - XII - II - Visual field intact OU. III, IV, VI - Extraocular movements intact. V - Facial sensation intact bilaterally. VII - Facial movement intact bilaterally. VIII - Hearing & vestibular intact bilaterally. X - Palate elevates symmetrically. XI - Chin turning & shoulder shrug intact bilaterally. XII - Tongue protrusion intact.  Motor Strength - The patient's strength was 5/5 BUEs and 3/5 right LE proximal (chronic) and 4+/5 left LE and pronator drift was absent.  Bulk was normal and fasciculations were absent.   Motor Tone - Muscle tone was assessed at the neck and appendages and was normal.  Reflexes - The patient's reflexes were 1+ in all extremities and she had no pathological reflexes.  Sensory - Light touch, temperature/pinprick were assessed and were symmetrical.    Coordination - The patient had normal movements in the hands with no ataxia or dysmetria.  Tremor was absent.  Gait and Station - in wheelchair,  not tested.   Functional score  mRS = 4 (chronic)   0 - No symptoms.   1 - No significant disability. Able to carry out all usual activities, despite some symptoms.   2 - Slight disability. Able to look after own affairs without  assistance, but unable to carry out all previous activities.   3 - Moderate disability. Requires some help, but able to walk unassisted.   4 - Moderately severe disability. Unable to attend to own bodily needs without assistance, and unable to walk unassisted.   5 - Severe disability. Requires constant nursing care and attention, bedridden, incontinent.   6 - Dead.   NIH Stroke Scale   Level Of Consciousness 0=Alert; keenly responsive 1=Not alert, but arousable by minor stimulation 2=Not alert, requires repeated stimulation 3=Responds only with reflex movements 0  LOC Questions to Month and Age 79=Answers both questions correctly 1=Answers one question correctly 2=Answers neither question correctly 0  LOC Commands      -Open/Close eyes     -Open/close grip 0=Performs both tasks correctly 1=Performs one task correctly 2=Performs neighter task correctly 0  Best Gaze 0=Normal 1=Partial gaze palsy 2=Forced deviation, or total gaze paresis 0  Visual 0=No visual loss 1=Partial hemianopia 2=Complete hemianopia 3=Bilateral hemianopia (blind including cortical blindness) 0  Facial Palsy 0=Normal symmetrical movement 1=Minor paralysis (asymmetry) 2=Partial paralysis (lower face) 3=Complete paralysis (upper and lower face) 0  Motor  0=No drift, limb holds posture for full 10 seconds 1=Drift, limb holds posture, no drift to bed 2=Some antigravity effort, cannot maintain posture, drifts to bed 3=No effort against gravity, limb falls 4=No movement Right Arm 0     Leg 1    Left Arm 0     Leg 2  Limb Ataxia 0=Absent 1=Present in one limb 2=Present in two limbs 0  Sensory 0=Normal 1=Mild to moderate sensory loss 2=Severe to total sensory loss 0  Best Language 0=No aphasia, normal 1=Mild to moderate aphasia 2=Mute, global aphasia 3=Mute, global aphasia 0  Dysarthria 0=Normal 1=Mild to moderate 2=Severe, unintelligible or mute/anarthric 0  Extinction/Neglect 0=No  abnormality 1=Extinction to bilateral simultaneous stimulation 2=Profound neglect 0  Total   3     Data reviewed: I personally reviewed the images and agree with the radiology interpretations.  Dg Chest 2 View 10/13/2015   No active cardiopulmonary disease.   Mr Maxine Glenn Head Wo Contrast 10/13/2015   1. High-grade stenosis of the distal right M1 segment or proximal posterior right M2 segment.  2. Marked attenuation of the anterior superior right M2 branch.  3. Mild narrowing of the proximal right M1 segment, less than 50%.  4. The left MCA bifurcation is intact.  5. Atherosclerotic changes within a hypoplastic left vertebral artery which terminates at the left PICA with a severe stenosis at the origin of the left PICA.  6. 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  7. High-grade stenosis of the proximal left posterior cerebral artery.   Mr Brain Wo Contrast 10/13/2015   1. Two punctate acute nonhemorrhagic infarcts within the posterior right frontal lobe along the precentral gyrus. These may be related to acute motor symptoms.  2. Advanced atrophy and diffuse white matter disease compatible with chronic microvascular ischemia.  3. Remote lacunar infarcts of the basal ganglia and brainstem.   CUS 07/2015 - left ICA patent s/p CEA, right ICA 1-39% stenosis  TTE 06/2015 - unremarkable EF 65%  Component     Latest Ref Rng & Units 09/20/2015 10/14/2015  Cholesterol  0 - 200 mg/dL  086181  Triglycerides     <150 mg/dL  79  HDL Cholesterol     >40 mg/dL  57  Total CHOL/HDL Ratio     RATIO  3.2  VLDL     0 - 40 mg/dL  16  LDL (calc)     0 - 99 mg/dL  578108 (H)  Hemoglobin I6NA1C     4.8 - 5.6 %  6.0 (H)  Mean Plasma Glucose     mg/dL  629126  TSH     5.280.41 - 4.135.90 uIU/mL 3.16     Assessment: As you may recall, she is a 82 y.o. Caucasian female with PMH of hypertension, baseline gait abnormality in wheelchair, COPD, PVD, carpal tunnel syndrome, and previous left CEA was admitted on  10/13/15 for right two CR/SO punctate infarcts and remote lacunar infarcts b/l BG and brainstem. MRA showed diffuse high grade stenosis in multiple large vessels including right M1/M2, left P1, left PICA, right ICA siphon and right P2. CUS 07/2015 unremarkable, and TTE in 06/2015 EF 65%. LDL 108 and A1C 6.0. She was put on DAPT and lipitor 20 and discharged back to [facility]. During the interval time, the patient has been doing well. Symptoms all resolved. Finished course of DAPT and will continue plavix alone.  Plan:  - continue plavix and lipitor for stroke prevention - discontinue ASA - check BP at facility every day and record - Follow up with your primary care physician for stroke risk factor modification. Recommend maintain blood pressure goal <130/80, diabetes with hemoglobin A1c goal below 7.0% and lipids with LDL cholesterol goal below 70 mg/dL.  - follow up in 3 months.   I spent more than 25 minutes of face to face time with the patient. Greater than 50% of time was spent in counseling and coordination of care. We discussed DAPT treatment course, BP monitoring and continued home exercise.    No orders of the defined types were placed in this encounter.   Meds ordered this encounter  Medications  . famotidine (PEPCID) 20 MG tablet  . clotrimazole-betamethasone (LOTRISONE) cream    Patient Instructions  - continue plavix and lipitor for stroke prevention - discontinue ASA - check BP at facility every day and record - Follow up with your primary care physician for stroke risk factor modification. Recommend maintain blood pressure goal <130/80, diabetes with hemoglobin A1c goal below 7.0% and lipids with LDL cholesterol goal below 70 mg/dL.  - follow up in 3 months.    Marvel PlanJindong Eddye Broxterman, MD PhD [facility] [address], [address] [place], [place] [address] [phone]

## 2016-01-15 NOTE — Patient Instructions (Signed)
-   continue plavix and lipitor for stroke prevention - discontinue ASA - check BP at facility every day and record - Follow up with your primary care physician for stroke risk factor modification. Recommend maintain blood pressure goal <130/80, diabetes with hemoglobin A1c goal below 7.0% and lipids with LDL cholesterol goal below 70 mg/dL.  - follow up in 3 months.

## 2016-01-16 ENCOUNTER — Encounter: Payer: Self-pay | Admitting: Neurology

## 2016-01-16 DIAGNOSIS — Z9889 Other specified postprocedural states: Secondary | ICD-10-CM | POA: Insufficient documentation

## 2016-02-18 ENCOUNTER — Non-Acute Institutional Stay (SKILLED_NURSING_FACILITY): Payer: Medicare Other | Admitting: Adult Health

## 2016-02-18 ENCOUNTER — Encounter: Payer: Self-pay | Admitting: Adult Health

## 2016-02-18 DIAGNOSIS — F0280 Dementia in other diseases classified elsewhere without behavioral disturbance: Secondary | ICD-10-CM

## 2016-02-18 DIAGNOSIS — R600 Localized edema: Secondary | ICD-10-CM | POA: Diagnosis not present

## 2016-02-18 DIAGNOSIS — I6522 Occlusion and stenosis of left carotid artery: Secondary | ICD-10-CM

## 2016-02-18 DIAGNOSIS — F0150 Vascular dementia without behavioral disturbance: Secondary | ICD-10-CM | POA: Diagnosis not present

## 2016-02-18 DIAGNOSIS — I10 Essential (primary) hypertension: Secondary | ICD-10-CM | POA: Diagnosis not present

## 2016-02-18 DIAGNOSIS — G309 Alzheimer's disease, unspecified: Secondary | ICD-10-CM

## 2016-02-18 DIAGNOSIS — I679 Cerebrovascular disease, unspecified: Secondary | ICD-10-CM

## 2016-02-18 DIAGNOSIS — N3281 Overactive bladder: Secondary | ICD-10-CM | POA: Diagnosis not present

## 2016-02-18 DIAGNOSIS — K219 Gastro-esophageal reflux disease without esophagitis: Secondary | ICD-10-CM

## 2016-02-18 DIAGNOSIS — K5901 Slow transit constipation: Secondary | ICD-10-CM | POA: Diagnosis not present

## 2016-02-18 NOTE — Progress Notes (Signed)
Patient ID: Tara Walters, female   DOB: [birth year], 82 y.o.   MRN: [id number]   [facility]   [facility] teacher of Service:  SNF (31)  Provider:   Peggye Ley, ANP [facility] [phone]   REED, Elmarie Shiley, DO  Patient Care Team: [name], DO as PCP - General (Geriatric Medicine) [facility] Fletcher Anon, NP as Nurse Practitioner (Nurse Practitioner)  Extended Emergency Contact Information Primary Emergency Contact: Rogelio Seen [address] [address] of [place] Home Phone: [phone] Mobile Phone: [phone] Relation: Daughter Secondary Emergency Contact: Reed,Tilden  [address] of [place] Home Phone: [phone] Mobile Phone: [phone] Relation: Relative   Code Status:  DNR Goals of care: Advanced Directive information Advanced Directives 02/18/2016  Does patient have an advance directive? -  Type of Advance Directive Out of facility DNR (pink MOST or yellow form);Healthcare Power of [place];Living will  Does patient want to make changes to advanced directive? -  Copy of advanced directive(s) in chart? -  Pre-existing out of facility DNR order (yellow form or pink MOST form) Yellow form placed in chart (order not valid for inpatient use)     Chief Complaint  Patient presents with  . Medical Management of Chronic Issues    HPI:  Pt is a 82 y.o. female seen today for medical management of chronic diseases.  She resides in skilled care due to decreased mobility due to CVA with left leg weakness.  She is able to walk with a walker and assistance. She has adjusted well to skilled care and has no complaints today.  LDL 108 on Lipitor 20 mg A1C 6.0 without meds Has overactive bladder and currently on myrbetriq with minimal symptoms Sleeps well with Lunesta 2 mg qhs Currently on plavix, has completed aspirin therapy. Using ocean spray to prevent nose bleeds and has not had any in the  past two months. Has significant reflux and uses protonix 40mg  qd and mintox qhs Bp controlled with Norsvasc 10 mg qd Wears compression hose with trace edema MMSE (needs to be performed and place in the chart)  Past Medical History:  Diagnosis Date  . Abnormality of gait   . Allergic rhinitis   . Anxiety   . Arthritis   . Carotid stenosis, right    asymptomatic  <40%  right  ica  and right eca  . Carpal tunnel syndrome on both sides   . Claudication, intermittent (HCC)   . COPD (chronic obstructive pulmonary disease) (HCC)   . Diverticulosis of colon   . Fracture, calcaneus closed   . History of esophagitis   . History of hypothyroidism   . Hypertension   . Internal hemorrhoids   . Irritable bowel syndrome 03/11/2010  . PAD (peripheral artery disease) (HCC) monitored by dr Rubye Oaks   moderate  . Peripheral vascular disease (HCC)   . Stroke (HCC)   . Unspecified hereditary and idiopathic peripheral neuropathy   . Urge urinary incontinence    Past Surgical History:  Procedure Laterality Date  . CAROTID ENDARTERECTOMY Left 11-18-2007  . CATARACT EXTRACTION W/ INTRAOCULAR LENS IMPLANT Right 11/2012  . COLONOSCOPY  2005   mild diverticulosis  . CYSTOSCOPY WITH INJECTION N/A 09/06/2013   Procedure: CYSTOSCOPY WITH BOTOX  INJECTION;  Surgeon: Martina Sinner, MD;  Location: [facility] [place];  Service: Urology;  Laterality: N/A;  . DILATION AND CURETTAGE OF UTERUS  1973  Allergies  Allergen Reactions  . Tramadol Nausea Only      Medication List       Accurate as of 02/18/16  4:37 PM. Always use your most recent med list.          acetaminophen 325 MG tablet Commonly known as:  TYLENOL Take 650 mg by mouth See admin instructions. Take 2 tablets (650 mg) by mouth 3 times daily, may also take 2 tablets (650 mg) every 6 hours as needed for pain   amLODipine 10 MG tablet Commonly known as:  NORVASC Take 10 mg by mouth daily. for blood pressure   aspirin  EC 81 MG tablet Take 1 tablet (81 mg total) by mouth daily.   atorvastatin 20 MG tablet Commonly known as:  LIPITOR Take 1 tablet (20 mg total) by mouth daily at 6 PM.   BIOFREEZE EX Apply 1 application topically as needed. To neck and knees and twice daily as needed to any affected joint.   clopidogrel 75 MG tablet Commonly known as:  PLAVIX Take 75 mg by mouth daily.   clotrimazole-betamethasone cream Commonly known as:  LOTRISONE   eszopiclone 2 MG Tabs tablet Commonly known as:  LUNESTA Take 1 tablet (2 mg total) by mouth at bedtime. Take immediately before bedtime   loratadine 10 MG tablet Commonly known as:  CLARITIN Take 10 mg by mouth daily.   MINTOX 200-200-20 MG/5ML suspension Generic drug:  alum & mag hydroxide-simeth Take 15 mLs by mouth at bedtime. And prn indigestion   MYRBETRIQ 50 MG Tb24 tablet Generic drug:  mirabegron ER Take 100 mg by mouth at bedtime.   nystatin cream Commonly known as:  MYCOSTATIN Apply 1 application topically 2 (two) times daily. Apply to per area twice daily until erythema resolved   pantoprazole 40 MG tablet Commonly known as:  PROTONIX Take 40 mg by mouth daily.   polyethylene glycol powder powder Commonly known as:  GLYCOLAX/MIRALAX Take 17 g by mouth daily as needed (constipation). Mix in 8 oz liquid and drink   SENNA S 8.6-50 MG tablet Generic drug:  senna-docusate Take 2 tablets by mouth at bedtime.   sodium chloride 0.65 % Soln nasal spray Commonly known as:  OCEAN Place 1 spray into both nostrils 2 (two) times daily.   SUPER B COMPLEX/C PO Take 2 tablets by mouth daily.   SYSTANE 0.4-0.3 % Gel ophthalmic gel Generic drug:  Polyethyl Glycol-Propyl Glycol Place 1 application into both eyes 4 (four) times daily.       Review of Systems  Constitutional: Negative for activity change, appetite change, chills, diaphoresis, fatigue, fever and unexpected weight change.  HENT: Negative for congestion.     Respiratory: Negative for cough, shortness of breath and wheezing.   Cardiovascular: Negative for chest pain, palpitations and leg swelling.  Gastrointestinal: Negative for abdominal distention, abdominal pain, constipation, diarrhea, nausea and vomiting.       Occasional reflux  Genitourinary: Negative for difficulty urinating and dysuria.  Musculoskeletal: Positive for arthralgias and gait problem. Negative for back pain, joint swelling and myalgias.  Neurological: Positive for weakness. Negative for dizziness, tremors, seizures, syncope, facial asymmetry, speech difficulty, light-headedness, numbness and headaches.  Psychiatric/Behavioral: Positive for confusion. Negative for agitation and behavioral problems.    Immunization History  Administered Date(s) Administered  . Influenza-Unspecified 03/09/2013, 03/01/2014, 03/08/2015  . PPD Test 12/04/2011  . Pneumococcal Conjugate-13 09/12/2014  . Pneumococcal Polysaccharide-23 09/12/2015   Pertinent  Health Maintenance Due  Topic Date Due  .  INFLUENZA VACCINE  12/25/2015  . PNA vac Low Risk Adult  Completed   Fall Risk  09/12/2015 03/21/2015 03/13/2014 02/28/2013  Falls in the past year? No No No Yes  Number falls in past yr: - - - 1  Injury with Fall? - - - Yes   Functional Status Survey:   Wt Readings from Last 3 Encounters:  02/18/16 161 lb 8 oz (73.3 kg)  01/01/16 163 lb (73.9 kg)  12/10/15 161 lb 4.8 oz (73.2 kg)   Wt Readings from Last 3 Encounters:  02/18/16 161 lb 8 oz (73.3 kg)  01/01/16 163 lb (73.9 kg)  12/10/15 161 lb 4.8 oz (73.2 kg)   Temp Readings from Last 3 Encounters:  02/18/16 97.5 F (36.4 C)  01/01/16 97.4 F (36.3 C) (Oral)  12/10/15 97.6 F (36.4 C)   BP Readings from Last 3 Encounters:  02/18/16 136/76  01/15/16 132/63  01/01/16 (!) 141/76   Pulse Readings from Last 3 Encounters:  02/18/16 65  01/15/16 60  01/01/16 65    Physical Exam  Constitutional: She is oriented to person, place,  and time. No distress.  HENT:  Head: Normocephalic and atraumatic.  Neck: No JVD present.  Cardiovascular: Normal rate and regular rhythm.   No murmur heard. No edema. No carotid bruit.  Pulmonary/Chest: Effort normal. No respiratory distress. She has wheezes (bilat mild exp).  Abdominal: Soft. Bowel sounds are normal.  Musculoskeletal: She exhibits no edema or tenderness.  LUE 4/5, LLE 3/5.  RUE 5/5 RLE 5/5.  Crepitus noted to both knees. Decrease ROM to neck. Kyphosis noted.   Neurological: She is alert and oriented to person, place, and time.  Skin: Skin is warm and dry. She is not diaphoretic.  Psychiatric: She has a normal mood and affect.  Nursing note and vitals reviewed.   Labs reviewed: Lab Results  Component Value Date   HGBA1C 6.0 (H) 10/14/2015     Recent Labs  10/01/15 1959 10/13/15 0851 10/13/15 0914  NA 137 139 140  K 3.8 4.0 3.9  CL  --  102 100*  CO2  --  27  --   GLUCOSE  --  112* 107*  BUN 22* 17 19  CREATININE 0.7 0.82 0.80  CALCIUM  --  9.4  --     Recent Labs  09/28/15 10/13/15 0851  AST 14 16  ALT 13 18  ALKPHOS 59 60  BILITOT  --  0.6  PROT  --  6.8  ALBUMIN  --  3.7    Recent Labs  09/28/15 10/01/15 1959 10/13/15 0851 10/13/15 0914  WBC 7.1 6.1 6.1  --   NEUTROABS  --   --  4.7  --   HGB 11.8* 11.8* 13.0 15.0  HCT 36 38 40.3 44.0  MCV  --   --  85.4  --   PLT 227 267 298  --    Lab Results  Component Value Date   TSH 3.16 09/20/2015   Lab Results  Component Value Date   HGBA1C 6.0 (H) 10/14/2015   Lab Results  Component Value Date   CHOL 181 10/14/2015   HDL 57 10/14/2015   LDLCALC 108 (H) 10/14/2015   TRIG 79 10/14/2015   CHOLHDL 3.2 10/14/2015    Significant Diagnostic Results in last 30 days:  No results found.  Assessment/Plan 1. Essential hypertension Controlled Fall risk so will need to be careful with BP control Continue Norvasc 10 mg qd  2. Cerebrovascular disease See #  1 Followed by  neuro Continue Lipitor 20 mg qd Completed ASA therapy, now on Plavix 75 mg qd  3. Gastroesophageal reflux disease without esophagitis Controlled Continue Protonix 40 mg QD  4. Slow transit constipation Continue Miralax 17 gm qd Senna s two qhs  5. OAB (overactive bladder) Improved overall  Continue myrbetriq 50 mg qd  6. Localized edema Stable Compression hose on in the am and off in the pm  7. Mixed vascular dementia Noted mild memory loss  Needs MMSE   Family/ staff Communication: discussed with resident  Labs/tests ordered:  NA  Peggye Leyhristy Leonilda Cozby, ANP [facility] [phone]

## 2016-03-13 DIAGNOSIS — Z23 Encounter for immunization: Secondary | ICD-10-CM | POA: Diagnosis not present

## 2016-03-20 ENCOUNTER — Encounter: Payer: Self-pay | Admitting: Adult Health

## 2016-03-20 ENCOUNTER — Non-Acute Institutional Stay (SKILLED_NURSING_FACILITY): Payer: Medicare Other | Admitting: Adult Health

## 2016-03-20 DIAGNOSIS — R1310 Dysphagia, unspecified: Secondary | ICD-10-CM | POA: Insufficient documentation

## 2016-03-20 DIAGNOSIS — F0280 Dementia in other diseases classified elsewhere without behavioral disturbance: Secondary | ICD-10-CM | POA: Diagnosis not present

## 2016-03-20 DIAGNOSIS — K219 Gastro-esophageal reflux disease without esophagitis: Secondary | ICD-10-CM | POA: Diagnosis not present

## 2016-03-20 DIAGNOSIS — R269 Unspecified abnormalities of gait and mobility: Secondary | ICD-10-CM

## 2016-03-20 DIAGNOSIS — I10 Essential (primary) hypertension: Secondary | ICD-10-CM | POA: Diagnosis not present

## 2016-03-20 DIAGNOSIS — R1312 Dysphagia, oropharyngeal phase: Secondary | ICD-10-CM

## 2016-03-20 DIAGNOSIS — G309 Alzheimer's disease, unspecified: Secondary | ICD-10-CM | POA: Diagnosis not present

## 2016-03-20 DIAGNOSIS — I679 Cerebrovascular disease, unspecified: Secondary | ICD-10-CM | POA: Diagnosis not present

## 2016-03-20 DIAGNOSIS — M1712 Unilateral primary osteoarthritis, left knee: Secondary | ICD-10-CM | POA: Diagnosis not present

## 2016-03-20 DIAGNOSIS — F0150 Vascular dementia without behavioral disturbance: Secondary | ICD-10-CM | POA: Diagnosis not present

## 2016-03-20 NOTE — Progress Notes (Signed)
Patient ID: Tara Walters, female   DOB: 1921/10/22, 82 y.o.   MRN: [id number]   [facility] [employer] teacher of Service:  SNF (31)  Provider:   Peggye Ley, ANP [facility] [phone]   REED, Elmarie Shiley, DO  Patient Care Team: [name], DO as PCP - General (Geriatric Medicine) [facility] Fletcher Anon, NP as Nurse Practitioner (Nurse Practitioner)  Extended Emergency Contact Information Primary Emergency Contact: Rogelio Seen [address] [address] of [place] Home Phone: [phone] Mobile Phone: [phone] Relation: Daughter Secondary Emergency Contact: Reed,Tilden  [address] [place] of [place] Home Phone: [phone] Mobile Phone: [phone] Relation: Relative   Code Status:  DNR Goals of care: Advanced Directive information Advanced Directives 03/20/2016  Does patient have an advance directive? Yes  Type of Advance Directive Out of facility DNR (pink MOST or yellow form);Healthcare Power of [place];Living will  Does patient want to make changes to advanced directive? Yes - information given  Copy of advanced directive(s) in chart? -  Pre-existing out of facility DNR order (yellow form or pink MOST form) Yellow form placed in chart (order not valid for inpatient use)     Chief Complaint  Patient presents with  . Medical Management of Chronic Issues    HPI:  Pt is a 82 y.o. female seen today for medical management of chronic diseases.  She resides in skilled care due to decreased mobility due to CVA with left leg weakness.  She is able to walk with a walker and assistance. No complaints today. Chronic OA to knees, neck, hands, shoulders. Uses scheduled Tylenol, reports that her pain is "tolerable" and has not changed over the past year. Has not needed prn Norco. Sleeps well with Lunesta 2 mg qhs Currently on plavix, for CVA prevention Has significant reflux and uses protonix 40mg  qd  and mintox qhs Bp controlled with Norsvasc 10 mg qd Wears compression hose with trace edema MMSE (02/18/16) 24/30, issues with orientation and recall, failed clock  Recent fall on 10/21 due to weakness without injury. Now using the wheelchair to go to the bathroom.  Has a chronic dry cough, occurs at meals and at bedtime. Hx of gerd on protonix and mintox.  Hx of CVA with dysphagia, has worked with speech who recommended a regular diet with thin liquids with technique in the dining room to prevent asp.  No bouts of pna have occurred.   Past Medical History:  Diagnosis Date  . Abnormality of gait   . Allergic rhinitis   . Anxiety   . Arthritis   . Carotid stenosis, right    asymptomatic  <40%  right  ica  and right eca  . Carpal tunnel syndrome on both sides   . Claudication, intermittent (HCC)   . COPD (chronic obstructive pulmonary disease) (HCC)   . Diverticulosis of colon   . Fracture, calcaneus closed   . History of esophagitis   . History of hypothyroidism   . Hypertension   . Internal hemorrhoids   . Irritable bowel syndrome 03/11/2010  . PAD (peripheral artery disease) (HCC) monitored by dr Rubye Oaks   moderate  . Peripheral vascular disease (HCC)   . Stroke (HCC)   . Unspecified hereditary and idiopathic peripheral neuropathy   . Urge urinary incontinence    Past Surgical History:  Procedure Laterality Date  . CAROTID ENDARTERECTOMY Left 11-18-2007  . CATARACT EXTRACTION W/ INTRAOCULAR LENS  IMPLANT Right 11/2012  . COLONOSCOPY  2005   mild diverticulosis  . CYSTOSCOPY WITH INJECTION N/A 09/06/2013   Procedure: CYSTOSCOPY WITH BOTOX  INJECTION;  Surgeon: Martina Sinner, MD;  Location: [facility];  Service: Urology;  Laterality: N/A;  . DILATION AND CURETTAGE OF UTERUS  1973    Allergies  Allergen Reactions  . Tramadol Nausea Only      Medication List       Accurate as of 03/20/16 12:10 PM. Always use your most recent med list.            acetaminophen 325 MG tablet Commonly known as:  TYLENOL Take 650 mg by mouth See admin instructions. Take 2 tablets (650 mg) by mouth 3 times daily, may also take 2 tablets (650 mg) every 6 hours as needed for pain   amLODipine 10 MG tablet Commonly known as:  NORVASC Take 10 mg by mouth daily. for blood pressure   aspirin EC 81 MG tablet Take 1 tablet (81 mg total) by mouth daily.   atorvastatin 20 MG tablet Commonly known as:  LIPITOR Take 1 tablet (20 mg total) by mouth daily at 6 PM.   BIOFREEZE EX Apply 1 application topically as needed. To neck and knees and twice daily as needed to any affected joint.   clopidogrel 75 MG tablet Commonly known as:  PLAVIX Take 75 mg by mouth daily.   clotrimazole-betamethasone cream Commonly known as:  LOTRISONE   eszopiclone 2 MG Tabs tablet Commonly known as:  LUNESTA Take 1 tablet (2 mg total) by mouth at bedtime. Take immediately before bedtime   HYDROcodone-acetaminophen 5-325 MG tablet Commonly known as:  NORCO/VICODIN Take 1 tablet by mouth every 6 (six) hours as needed for moderate pain.   loratadine 10 MG tablet Commonly known as:  CLARITIN Take 10 mg by mouth daily.   MINTOX 200-200-20 MG/5ML suspension Generic drug:  alum & mag hydroxide-simeth Take 15 mLs by mouth at bedtime. And prn indigestion   MYRBETRIQ 50 MG Tb24 tablet Generic drug:  mirabegron ER Take 100 mg by mouth at bedtime.   nystatin cream Commonly known as:  MYCOSTATIN Apply 1 application topically 2 (two) times daily. Apply to per area twice daily until erythema resolved   pantoprazole 40 MG tablet Commonly known as:  PROTONIX Take 40 mg by mouth daily.   polyethylene glycol powder powder Commonly known as:  GLYCOLAX/MIRALAX Take 17 g by mouth daily as needed (constipation). Mix in 8 oz liquid and drink   SENNA S 8.6-50 MG tablet Generic drug:  senna-docusate Take 2 tablets by mouth at bedtime.   SUPER B COMPLEX/C PO Take 2 tablets by  mouth daily.   SYSTANE 0.4-0.3 % Gel ophthalmic gel Generic drug:  Polyethyl Glycol-Propyl Glycol Place 1 application into both eyes 4 (four) times daily.       Review of Systems  Constitutional: Negative for activity change, appetite change, chills, diaphoresis, fatigue, fever and unexpected weight change.  HENT: Negative for congestion.   Respiratory: Negative for cough, shortness of breath and wheezing.   Cardiovascular: Negative for chest pain, palpitations and leg swelling.  Gastrointestinal: Negative for abdominal distention, abdominal pain, constipation, diarrhea, nausea and vomiting.       Occasional reflux  Genitourinary: Negative for difficulty urinating and dysuria.  Musculoskeletal: Positive for arthralgias and gait problem. Negative for back pain, joint swelling and myalgias.  Neurological: Positive for weakness. Negative for dizziness, tremors, seizures, syncope, facial asymmetry, speech difficulty, light-headedness,  numbness and headaches.  Psychiatric/Behavioral: Positive for confusion. Negative for agitation and behavioral problems.    Immunization History  Administered Date(s) Administered  . Influenza-Unspecified 03/09/2013, 03/01/2014, 03/08/2015  . PPD Test 12/04/2011  . Pneumococcal Conjugate-13 09/12/2014  . Pneumococcal Polysaccharide-23 09/12/2015   Pertinent  Health Maintenance Due  Topic Date Due  . INFLUENZA VACCINE  12/25/2015  . PNA vac Low Risk Adult  Completed   Fall Risk  02/18/2016 09/12/2015 03/21/2015 03/13/2014 02/28/2013  Falls in the past year? No No No No Yes  Number falls in past yr: - - - - 1  Injury with Fall? - - - - Yes   Functional Status Survey:   Wt Readings from Last 3 Encounters:  03/20/16 161 lb 8 oz (73.3 kg)  02/18/16 161 lb 8 oz (73.3 kg)  01/01/16 163 lb (73.9 kg)    Temp Readings from Last 3 Encounters:  03/20/16 97.4 F (36.3 C)  02/18/16 97.5 F (36.4 C)  01/01/16 97.4 F (36.3 C) (Oral)   BP Readings from  Last 3 Encounters:  03/20/16 125/62  02/18/16 136/76  01/15/16 132/63   Pulse Readings from Last 3 Encounters:  03/20/16 60  02/18/16 65  01/15/16 60    Physical Exam  Constitutional: She is oriented to person, place, and time. No distress.  HENT:  Head: Normocephalic and atraumatic.  Neck: No JVD present.  Cardiovascular: Normal rate and regular rhythm.   No murmur heard. No edema. No carotid bruit.  Pulmonary/Chest: Effort normal. No respiratory distress. She has no wheezes.  Clear bilat  Abdominal: Soft. Bowel sounds are normal.  Musculoskeletal: She exhibits no edema or tenderness.  LUE 4/5, LLE 3/5.  RUE 5/5 RLE 5/5.  Crepitus noted to both knees. Decrease ROM to neck. Kyphosis noted.   Neurological: She is alert and oriented to person, place, and time.  Skin: Skin is warm and dry. She is not diaphoretic.  Psychiatric: She has a normal mood and affect.  Nursing note and vitals reviewed.   Labs reviewed: Lab Results  Component Value Date   HGBA1C 6.0 (H) 10/14/2015     Recent Labs  10/01/15 1959 10/13/15 0851 10/13/15 0914  NA 137 139 140  K 3.8 4.0 3.9  CL  --  102 100*  CO2  --  27  --   GLUCOSE  --  112* 107*  BUN 22* 17 19  CREATININE 0.7 0.82 0.80  CALCIUM  --  9.4  --     Recent Labs  09/28/15 10/13/15 0851  AST 14 16  ALT 13 18  ALKPHOS 59 60  BILITOT  --  0.6  PROT  --  6.8  ALBUMIN  --  3.7    Recent Labs  09/28/15 10/01/15 1959 10/13/15 0851 10/13/15 0914  WBC 7.1 6.1 6.1  --   NEUTROABS  --   --  4.7  --   HGB 11.8* 11.8* 13.0 15.0  HCT 36 38 40.3 44.0  MCV  --   --  85.4  --   PLT 227 267 298  --    Lab Results  Component Value Date   TSH 3.16 09/20/2015   Lab Results  Component Value Date   HGBA1C 6.0 (H) 10/14/2015   Lab Results  Component Value Date   CHOL 181 10/14/2015   HDL 57 10/14/2015   LDLCALC 108 (H) 10/14/2015   TRIG 79 10/14/2015   CHOLHDL 3.2 10/14/2015    Significant Diagnostic Results in last  30 days:  No results found.  Assessment/Plan  1. Oropharyngeal dysphagia Continue regular diet with thin liquids with asp prec  2. Gastroesophageal reflux disease without esophagitis Stable Continue protonix 40 mg qd and mintox qhs  3. Cerebrovascular disease No new events, remains on Plavix Monitor LDL and BP, which are at goal at this time Continue Lipitor 20 mg qhs  4. Mixed Alzheimer's and vascular dementia Stable Not currently on memory meds, would not add at this point  5. Primary osteoarthritis of left knee Stable Continue scheduled tylenol and prn Norco  6. Essential hypertension Controlled  7. Gait disorder Due to debility and hx of CVA Uses walker but should use WC for longer distances   Family/ staff Communication: discussed with resident  Labs/tests ordered:  NA  Peggye Leyhristy Nicolette Gieske, ANP [facility] [phone]

## 2016-03-29 ENCOUNTER — Encounter: Payer: Self-pay | Admitting: Adult Health

## 2016-04-14 ENCOUNTER — Non-Acute Institutional Stay (SKILLED_NURSING_FACILITY): Payer: Medicare Other | Admitting: Adult Health

## 2016-04-14 DIAGNOSIS — D519 Vitamin B12 deficiency anemia, unspecified: Secondary | ICD-10-CM | POA: Diagnosis not present

## 2016-04-14 DIAGNOSIS — G2581 Restless legs syndrome: Secondary | ICD-10-CM | POA: Diagnosis not present

## 2016-04-14 DIAGNOSIS — R251 Tremor, unspecified: Secondary | ICD-10-CM | POA: Diagnosis not present

## 2016-04-14 DIAGNOSIS — G44229 Chronic tension-type headache, not intractable: Secondary | ICD-10-CM

## 2016-04-14 DIAGNOSIS — R29898 Other symptoms and signs involving the musculoskeletal system: Secondary | ICD-10-CM | POA: Diagnosis not present

## 2016-04-14 DIAGNOSIS — M6281 Muscle weakness (generalized): Secondary | ICD-10-CM | POA: Diagnosis not present

## 2016-04-14 LAB — BASIC METABOLIC PANEL
BUN: 25 mg/dL — AB (ref 4–21)
CREATININE: 0.6 mg/dL (ref 0.5–1.1)
Glucose: 102 mg/dL
POTASSIUM: 4.3 mmol/L (ref 3.4–5.3)
SODIUM: 140 mmol/L (ref 137–147)

## 2016-04-14 LAB — HEPATIC FUNCTION PANEL
ALK PHOS: 89 U/L (ref 25–125)
ALT: 17 U/L (ref 7–35)
AST: 17 U/L (ref 13–35)
Bilirubin, Total: 0.3 mg/dL

## 2016-04-14 LAB — CBC AND DIFFERENTIAL
HEMATOCRIT: 40 % (ref 36–46)
Hemoglobin: 13 g/dL (ref 12.0–16.0)
Platelets: 181 10*3/uL (ref 150–399)
WBC: 6.5 10^3/mL

## 2016-04-14 NOTE — Progress Notes (Signed)
Patient ID: Tara Walters, female   DOB: Jul 22, 1921, 82 y.o.   MRN: [id number]   Location:   [facility]   Place of Service:  SNF (31)  Provider:   Peggye Leyhristy Walters, ANP [facility] [phone]   Walters, Tara ShileyIFFANY, DO  Patient Care Team: Kermit Baloiffany L Reed, DO as PCP - General (Geriatric Medicine) [facility] Fletcher Anonhristina Wert, NP as Nurse Practitioner (Nurse Practitioner)  Extended Emergency Contact Information Primary Emergency Contact: Tara Walters,Tara Walters          [place] [id number] [place] Home Phone: [phone] Mobile Phone: [phone] Relation: Daughter Secondary Emergency Contact: Walters,Tara Walters  [place] Home Phone: [phone] Mobile Phone: [phone] Relation: Relative   Code Status:  DNR Goals of care: Advanced Directive information Advanced Directives 03/20/2016  Does patient have an advance directive? Yes  Type of Advance Directive Out of facility DNR (pink MOST or yellow form);Healthcare Power of [place];Living will  Does patient want to make changes to advanced directive? Yes - information given  Copy of advanced directive(s) in chart? -  Pre-existing out of facility DNR order (yellow form or pink MOST form) Yellow form placed in chart (order not valid for inpatient use)     Chief Complaint  Patient presents with  . Acute Visit    shaking, not feeling well    HPI:  Pt is a 82 y.o. female seen today for weakness, headache and shaking. Staff reports she has increased weakness when standing and was shaking while sitting in her chair. Recent fall without injury due to weakness on 10/21, she did not hit her head. Now using the wheelchair to go to the bathroom. Staff has observed shakiness when pt transfers from wheelchair to commode. She states her legs fell weak when she tries to stand up and she has pain in her L knee due to arthritis. She has some involuntary movement to legs while they are  elevated in recliner. She reports that she has an intermittent headache along the sides and back of her head. She takes scheduled Tylenol, but refused prn Tylenol. She has a slight dry chronic cough. She denies other symptoms. She has not had a fever. Currently on plavix, for CVA prevention. She has had no recent changes in her mental status.   Past Medical History:  Diagnosis Date  . Abnormality of gait   . Allergic rhinitis   . Anxiety   . Arthritis   . Carotid stenosis, right    asymptomatic  <40%  right  ica  and right eca  . Carpal tunnel syndrome on both sides   . Claudication, intermittent (HCC)   . COPD (chronic obstructive pulmonary disease) (HCC)   . Diverticulosis of colon   . Fracture, calcaneus closed   . History of esophagitis   . History of hypothyroidism   . Hypertension   . Internal hemorrhoids   . Irritable bowel syndrome 03/11/2010  . PAD (peripheral artery disease) (HCC) monitored by dr Rubye Oaksdickerson   moderate  . Peripheral vascular disease (HCC)   . Stroke (HCC)   . Unspecified hereditary and idiopathic peripheral neuropathy   . Urge urinary incontinence    Past Surgical History:  Procedure Laterality Date  . CAROTID ENDARTERECTOMY Left 11-18-2007  . CATARACT EXTRACTION W/ INTRAOCULAR LENS IMPLANT Right 11/2012  . COLONOSCOPY  2005   mild diverticulosis  . CYSTOSCOPY WITH INJECTION N/A 09/06/2013   Procedure: CYSTOSCOPY WITH BOTOX  INJECTION;  Surgeon: Jonna CoupScott A MacDiarmid,  MD;  Location: [facility];  Service: Urology;  Laterality: N/A;  . DILATION AND CURETTAGE OF UTERUS  1973    Allergies  Allergen Reactions  . Tramadol Nausea Only      Medication List       Accurate as of 04/14/16  3:36 PM. Always use your most recent med list.          acetaminophen 325 MG tablet Commonly known as:  TYLENOL Take 650 mg by mouth See admin instructions. Take 2 tablets (650 mg) by mouth 3 times daily, may also take 2 tablets (650 mg) every 6 hours  as needed for pain   amLODipine 10 MG tablet Commonly known as:  NORVASC Take 10 mg by mouth daily. for blood pressure   atorvastatin 20 MG tablet Commonly known as:  LIPITOR Take 1 tablet (20 mg total) by mouth daily at 6 PM.   BIOFREEZE EX Apply 1 application topically as needed. To neck and knees and twice daily as needed to any affected joint.   clopidogrel 75 MG tablet Commonly known as:  PLAVIX Take 75 mg by mouth daily.   eszopiclone 1 MG Tabs tablet Commonly known as:  LUNESTA Take 1 mg by mouth at bedtime. Take immediately before bedtime   HYDROcodone-acetaminophen 5-325 MG tablet Commonly known as:  NORCO/VICODIN Take 1 tablet by mouth every 6 (six) hours as needed for moderate pain.   MINTOX 200-200-20 MG/5ML suspension Generic drug:  alum & mag hydroxide-simeth Take 15 mLs by mouth at bedtime. And prn indigestion   MYRBETRIQ 50 MG Tb24 tablet Generic drug:  mirabegron ER Take 100 mg by mouth at bedtime.   pantoprazole 40 MG tablet Commonly known as:  PROTONIX Take 40 mg by mouth daily.   polyethylene glycol powder powder Commonly known as:  GLYCOLAX/MIRALAX Take 17 g by mouth daily as needed (constipation). Mix in 8 oz liquid and drink   SENNA S 8.6-50 MG tablet Generic drug:  senna-docusate Take 2 tablets by mouth at bedtime.   SUPER B COMPLEX/C PO Take 2 tablets by mouth daily.   SYSTANE 0.4-0.3 % Gel ophthalmic gel Generic drug:  Polyethyl Glycol-Propyl Glycol Place 1 application into both eyes 4 (four) times daily.       Review of Systems  Constitutional: Negative for activity change, appetite change, chills, diaphoresis, fatigue, fever and unexpected weight change.  HENT: Negative for congestion, ear pain, rhinorrhea, sinus pain, sore throat, trouble swallowing and voice change.   Respiratory: Positive for cough (chronic non-productive intermittent cough). Negative for choking, chest tightness, shortness of breath and wheezing.     Cardiovascular: Negative for chest pain, palpitations and leg swelling.  Gastrointestinal: Negative for abdominal distention, abdominal pain, constipation, diarrhea, nausea and vomiting.       Occasional reflux  Genitourinary: Negative for difficulty urinating and dysuria.  Musculoskeletal: Positive for arthralgias (arthritis in neck) and gait problem. Negative for back pain, joint swelling and myalgias.  Neurological: Positive for weakness and headaches. Negative for dizziness, tremors, seizures, syncope, facial asymmetry, speech difficulty, light-headedness and numbness.  Psychiatric/Behavioral: Positive for sleep disturbance. Negative for agitation and behavioral problems. The patient is not nervous/anxious.     Immunization History  Administered Date(s) Administered  . Influenza Inj Mdck Quad Pf 03/13/2016  . Influenza-Unspecified 03/09/2013, 03/01/2014, 03/08/2015  . PPD Test 12/04/2011  . Pneumococcal Conjugate-13 09/12/2014  . Pneumococcal Polysaccharide-23 09/12/2015   Pertinent  Health Maintenance Due  Topic Date Due  . INFLUENZA VACCINE  Completed  .  PNA vac Low Risk Adult  Completed   Fall Risk  02/18/2016 09/12/2015 03/21/2015 03/13/2014 02/28/2013  Falls in the past year? No No No No Yes  Number falls in past yr: - - - - 1  Injury with Fall? - - - - Yes   Functional Status Survey:   Wt Readings from Last 3 Encounters:  03/20/16 161 lb 8 oz (73.3 kg)  02/18/16 161 lb 8 oz (73.3 kg)  01/01/16 163 lb (73.9 kg)    Temp Readings from Last 3 Encounters:  04/14/16 97.4 F (36.3 C)  03/20/16 97.4 F (36.3 C)  02/18/16 97.5 F (36.4 C)   BP Readings from Last 3 Encounters:  04/14/16 (!) 118/58  03/20/16 125/62  02/18/16 136/76   Pulse Readings from Last 3 Encounters:  04/14/16 67  03/20/16 60  02/18/16 65    Physical Exam  Constitutional: She is oriented to person, place, and time. She appears well-developed and well-nourished. No distress.  HENT:  Head:  Normocephalic and atraumatic.  Right Ear: External ear normal.  Left Ear: External ear normal.  Nose: Nose normal.  Mouth/Throat: Oropharynx is clear and moist. No oropharyngeal exudate.  Eyes: Conjunctivae are normal. Pupils are equal, round, and reactive to light.  Neck: No JVD present.  Cardiovascular: Normal rate, regular rhythm and intact distal pulses.  Exam reveals no gallop and no friction rub.   No murmur heard. No edema.  Pulmonary/Chest: Effort normal and breath sounds normal. No respiratory distress. She has no wheezes. She has no rales. She exhibits no tenderness.  Clear bilat  Abdominal: Soft. Bowel sounds are normal. She exhibits no distension. There is no tenderness.  Musculoskeletal: She exhibits no edema or tenderness.  LUE 4/5, LLE 3/5.  RUE 5/5 RLE 5/5.  Crepitus noted to both knees. Decrease ROM to neck. Kyphosis noted.   Lymphadenopathy:    She has no cervical adenopathy.  Neurological: She is alert and oriented to person, place, and time. No cranial nerve deficit.  Alert to self, place and situation. No resting tremor or bradykinesia noted  Restless legs when elevated  Skin: Skin is warm and dry. She is not diaphoretic.  Psychiatric: She has a normal mood and affect.  Nursing note and vitals reviewed.   Labs reviewed: Lab Results  Component Value Date   HGBA1C 6.0 (H) 10/14/2015     Recent Labs  10/01/15 1959 10/13/15 0851 10/13/15 0914  NA 137 139 140  K 3.8 4.0 3.9  CL  --  102 100*  CO2  --  27  --   GLUCOSE  --  112* 107*  BUN 22* 17 19  CREATININE 0.7 0.82 0.80  CALCIUM  --  9.4  --     Recent Labs  09/28/15 10/13/15 0851  AST 14 16  ALT 13 18  ALKPHOS 59 60  BILITOT  --  0.6  PROT  --  6.8  ALBUMIN  --  3.7    Recent Labs  09/28/15 10/01/15 1959 10/13/15 0851 10/13/15 0914  WBC 7.1 6.1 6.1  --   NEUTROABS  --   --  4.7  --   HGB 11.8* 11.8* 13.0 15.0  HCT 36 38 40.3 44.0  MCV  --   --  85.4  --   PLT 227 267 298  --     Lab Results  Component Value Date   TSH 3.16 09/20/2015   Lab Results  Component Value Date   HGBA1C 6.0 (H) 10/14/2015  Lab Results  Component Value Date   CHOL 181 10/14/2015   HDL 57 10/14/2015   LDLCALC 108 (H) 10/14/2015   TRIG 79 10/14/2015   CHOLHDL 3.2 10/14/2015    Significant Diagnostic Results in last 30 days:  No results found.  Assessment/Plan 1. Weakness of both lower extremities -Likely due to dysmobility and worsening arthritis -Discussed possibility of PT with pt and she refused PT at this time -CBC, BMP, B12, Magnesium level ordered -Will continue to monitor  2. Nonintractable tension headaches -Continue Tylenol as scheduled for headache (this may be coming from chronic neck pain from OA) -Will continue to monitor  3. Episode of shaking -No obvious shaking during examination -Shaking episodes are likely due to weakness when standing -Will continue to monitor  4. Restless legs -B12, Mag, and CBC ordered -Pt has not complained of restless legs keeping her awake at night   Family/ staff Communication: discussed with resident  Labs/tests ordered: CBC, BMP, B12, Magnesium  Tara Walters, ANP [facility] [phone]

## 2016-04-21 ENCOUNTER — Ambulatory Visit (INDEPENDENT_AMBULATORY_CARE_PROVIDER_SITE_OTHER): Payer: Medicare Other | Admitting: Neurology

## 2016-04-21 ENCOUNTER — Encounter: Payer: Self-pay | Admitting: Neurology

## 2016-04-21 VITALS — BP 134/61 | HR 66 | Ht 65.0 in

## 2016-04-21 DIAGNOSIS — E785 Hyperlipidemia, unspecified: Secondary | ICD-10-CM | POA: Diagnosis not present

## 2016-04-21 DIAGNOSIS — I639 Cerebral infarction, unspecified: Secondary | ICD-10-CM | POA: Diagnosis not present

## 2016-04-21 DIAGNOSIS — I679 Cerebrovascular disease, unspecified: Secondary | ICD-10-CM | POA: Diagnosis not present

## 2016-04-21 DIAGNOSIS — I10 Essential (primary) hypertension: Secondary | ICD-10-CM

## 2016-04-21 DIAGNOSIS — Z9889 Other specified postprocedural states: Secondary | ICD-10-CM

## 2016-04-21 DIAGNOSIS — I739 Peripheral vascular disease, unspecified: Secondary | ICD-10-CM | POA: Diagnosis not present

## 2016-04-21 NOTE — Patient Instructions (Signed)
-   continue plavix and lipitor for stroke prevention - self exercise at facility, avoid fall.  - check BP at facility every day and record - Follow up with your primary care physician for stroke risk factor modification. Recommend maintain blood pressure goal <130/80, diabetes with hemoglobin A1c goal below 7.0% and lipids with LDL cholesterol goal below 70 mg/dL.  - follow up in 6 months.

## 2016-04-21 NOTE — Progress Notes (Signed)
STROKE NEUROLOGY FOLLOW UP NOTE  NAME: Tara Walters DOB: 1922-01-06  REASON FOR VISIT: stroke follow up HISTORY FROM: pt and chart  Today we had the pleasure of seeing Tara Walters in follow-up at our Neurology Clinic. Pt was accompanied by nursing staff from [facility].   History Summary [name] is a 82 y.o. female with history of hypertension, gait abnormality, COPD, PVD, carpal tunnel syndrome, and previous left CEA was admitted on 10/13/15 for left hand numbness and drooping left eye. MRI found to have right two CR/SO punctate infarcts and remote lacunar infarcts b/l BG and brainstem. MRA showed diffuse high grade stenosis in multiple large vessels including right M1/M2, left P1, left PICA, right ICA siphon and right P2. CUS 07/2015 unremarkable, and TTE in 06/2015 EF 65%. LDL 108 and A1C 6.0. She was put on DAPT and lipitor 20 and discharged back to [facility].   Follow up 01/15/16 - the patient has been doing well. Symptoms all resolved. She has old BLE weakness, L>R and not able to ambulate at baseline before the stroke which has not been changed. BP 132/63. Still on DAPT and no side effect. Otherwise, no complains.   Interval History During the interval time, pt stated that she is doing well and no complains. She lives in [facility] and mostly wheelchair bound which is her baseline. She did not have PT/OT after discharge and she declined further PT/OT at this time. She is on plavix and lipitor. Bp today 134/61.   REVIEW OF SYSTEMS: Full 14 system review of systems performed and notable only for those listed below and in HPI above, all others are negative:  Constitutional:   Cardiovascular:  Ear/Nose/Throat:   Skin:  Eyes:   Respiratory:  cough Gastroitestinal:  constipation Genitourinary: frequency of urination, incontinence of bladder Hematology/Lymphatic:   Endocrine:  Musculoskeletal:  neck pain Allergy/Immunology:   Neurological:   Psychiatric:  Sleep:  snoring  The following represents the patient's updated allergies and side effects list: Allergies  Allergen Reactions  . Tramadol Nausea Only    The neurologically relevant items on the patient's problem list were reviewed on today's visit.  Neurologic Examination  A problem focused neurological exam (12 or more points of the single system neurologic examination, vital signs counts as 1 point, cranial nerves count for 8 points) was performed.  Blood pressure 134/61, pulse 66, height 5\' 5"  (1.651 m).  General - Well nourished, well developed, in no apparent distress.  Ophthalmologic - Fundi not visualized due to noncooperation.  Cardiovascular - Regular rate and rhythm.  Mental Status -  Level of arousal and orientation to place, and person were intact, but not orientated to time. Language including expression, naming, repetition, comprehension was assessed and found intact. Fund of Knowledge was assessed and was impaired.  Cranial Nerves II - XII - II - Visual field intact OU. III, IV, VI - Extraocular movements intact. V - Facial sensation intact bilaterally. VII - Facial movement intact bilaterally. VIII - Hearing & vestibular intact bilaterally. X - Palate elevates symmetrically. XI - Chin turning & shoulder shrug intact bilaterally. XII - Tongue protrusion intact.  Motor Strength - The patient's strength was 5/5 BUEs and 4/5 right LE proximal (chronic) and 4/5 left LE proximal, BLE distally 5/5 and pronator drift was absent.  Bulk was normal and fasciculations were absent.   Motor Tone - Muscle tone was assessed at the neck and appendages and was normal.  Reflexes - The patient's reflexes  were 1+ in all extremities and she had no pathological reflexes.  Sensory - Light touch, temperature/pinprick were assessed and were symmetrical.    Coordination - The patient had normal movements in the hands with no ataxia or dysmetria.  Tremor was absent.  Gait and  Station - in wheelchair, not tested.   Data reviewed: I personally reviewed the images and agree with the radiology interpretations.  Dg Chest 2 View 10/13/2015   No active cardiopulmonary disease.   Mr Tara GlennMra Head Wo Contrast 10/13/2015   1. High-grade stenosis of the distal right M1 segment or proximal posterior right M2 segment.  2. Marked attenuation of the anterior superior right M2 branch.  3. Mild narrowing of the proximal right M1 segment, less than 50%.  4. The left MCA bifurcation is intact.  5. Atherosclerotic changes within a hypoplastic left vertebral artery which terminates at the left PICA with a severe stenosis at the origin of the left PICA.  6. 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  7. High-grade stenosis of the proximal left posterior cerebral artery.   Mr Brain Wo Contrast 10/13/2015   1. Two punctate acute nonhemorrhagic infarcts within the posterior right frontal lobe along the precentral gyrus. These may be related to acute motor symptoms.  2. Advanced atrophy and diffuse white matter disease compatible with chronic microvascular ischemia.  3. Remote lacunar infarcts of the basal ganglia and brainstem.   CUS 07/2015 - left ICA patent s/p CEA, right ICA 1-39% stenosis  TTE 06/2015 - unremarkable EF 65%  Component     Latest Ref Rng & Units 09/20/2015 10/14/2015  Cholesterol     0 - 200 mg/dL  161181  Triglycerides     <150 mg/dL  79  HDL Cholesterol     >40 mg/dL  57  Total CHOL/HDL Ratio     RATIO  3.2  VLDL     0 - 40 mg/dL  16  LDL (calc)     0 - 99 mg/dL  096108 (H)  Hemoglobin E4VA1C     4.8 - 5.6 %  6.0 (H)  Mean Plasma Glucose     mg/dL  409126  TSH     8.110.41 - 9.145.90 uIU/mL 3.16     Assessment: As you may recall, she is a 82 y.o. Caucasian female with PMH of hypertension, baseline gait abnormality in wheelchair, COPD, PVD, carpal tunnel syndrome, and previous left CEA was admitted on 10/13/15 for right two CR/SO punctate infarcts and remote lacunar  infarcts b/l BG and brainstem. MRA showed diffuse high grade stenosis in multiple large vessels including right M1/M2, left P1, left PICA, right ICA siphon and right P2. CUS 07/2015 unremarkable, and TTE in 06/2015 EF 65%. LDL 108 and A1C 6.0. She was put on DAPT and lipitor 20 and discharged back to [facility]. During the interval time, the patient has been doing well. Symptoms all resolved, still at baseline with wheelchair. Finished course of DAPT and on plavix alone. BP stable and no complains  Plan:  - continue plavix and lipitor for stroke prevention - self exercise at facility, avoid fall.  - check BP at facility every day and record - Follow up with your primary care physician for stroke risk factor modification. Recommend maintain blood pressure goal <130/80, diabetes with hemoglobin A1c goal below 7.0% and lipids with LDL cholesterol goal below 70 mg/dL.  - follow up in 6 months.  No orders of the defined types were placed in this encounter.   Meds  ordered this encounter  Medications  . DISCONTD: eszopiclone (LUNESTA) 2 MG TABS tablet    Patient Instructions  - continue plavix and lipitor for stroke prevention - self exercise at facility, avoid fall.  - check BP at facility every day and record - Follow up with your primary care physician for stroke risk factor modification. Recommend maintain blood pressure goal <130/80, diabetes with hemoglobin A1c goal below 7.0% and lipids with LDL cholesterol goal below 70 mg/dL.  - follow up in 6 months.   Marvel PlanJindong Mendy Lapinsky, MD PhD [facility] [address], [address] [place], [place] [id number] [phone]

## 2016-04-24 ENCOUNTER — Non-Acute Institutional Stay (SKILLED_NURSING_FACILITY): Payer: Medicare Other | Admitting: Adult Health

## 2016-04-24 ENCOUNTER — Encounter: Payer: Self-pay | Admitting: Adult Health

## 2016-04-24 DIAGNOSIS — G8929 Other chronic pain: Secondary | ICD-10-CM | POA: Diagnosis not present

## 2016-04-24 DIAGNOSIS — M545 Low back pain: Secondary | ICD-10-CM | POA: Diagnosis not present

## 2016-04-24 DIAGNOSIS — I739 Peripheral vascular disease, unspecified: Secondary | ICD-10-CM

## 2016-04-24 DIAGNOSIS — F5101 Primary insomnia: Secondary | ICD-10-CM | POA: Diagnosis not present

## 2016-04-24 DIAGNOSIS — I10 Essential (primary) hypertension: Secondary | ICD-10-CM

## 2016-04-24 NOTE — Progress Notes (Signed)
Patient ID: Tara Walters, female   DOB: [birth year], 82 y.o.   MRN: [id number]   Location:   [facility] of Service:  SNF (31)  Provider:   Peggye Ley, ANP [facility] [phone]   REED, Elmarie Shiley, DO  Patient Care Team: [name], DO as PCP - General (Geriatric Medicine) [facility] Fletcher Anon, NP as Nurse Practitioner (Nurse Practitioner)  Extended Emergency Contact Information Primary Emergency Contact: Rogelio Seen [address] [address] of [place] Home Phone: [phone] Mobile Phone: [phone] Relation: Daughter Secondary Emergency Contact: Reed,Tilden  [place] of [place] Home Phone: [phone] Mobile Phone: [phone] Relation: Relative   Code Status:  DNR Goals of care: Advanced Directive information Advanced Directives 04/24/2016  Does Patient Have a Medical Advance Directive? Yes  Type of Advance Directive Out of facility DNR (pink MOST or yellow form);Healthcare Power of [place];Living will  Does patient want to make changes to medical advance directive? -  Copy of Healthcare Power of Attorney in Chart? -  Pre-existing out of facility DNR order (yellow form or pink MOST form) Yellow form placed in chart (order not valid for inpatient use)     Chief Complaint  Patient presents with  . Medical Management of Chronic Issues    HPI:  82 y.o.female  residing at [facility], skilled care. I am here to review her chronic medical issues.  VS have been stable over the past month. Weight has remained stable at 161.5 lbs.   She has chronic pain to her low back and neck area that she reports is from OA.  This has been worse over the past few months despite scheduled tylenol. She just bought a new mattress and is hoping this helps. I do not see any recent MRI or CT scan of the spine.  She has progressive weakness and is now walking very little, only to  the BR.  She has a hx of CVA with residual left leg weakness. Has some leg twitching that does not seem to bother her.  B12 and MG were checked and WNL. She was recently seen by neurology in follow up due to her cerebrovascular dz. She remains on lipitor and Norvasc for BP control.  She has a hx of PVD but denies lower ext leg pain or numbness.  Has carotid dopplers and ABI's due in April of 2018.      Past Medical History:  Diagnosis Date  . Abnormality of gait   . Allergic rhinitis   . Anxiety   . Arthritis   . Carotid stenosis, right    asymptomatic  <40%  right  ica  and right eca  . Carpal tunnel syndrome on both sides   . Claudication, intermittent (HCC)   . COPD (chronic obstructive pulmonary disease) (HCC)   . Diverticulosis of colon   . Fracture, calcaneus closed   . History of esophagitis   . History of hypothyroidism   . Hypertension   . Internal hemorrhoids   . Irritable bowel syndrome 03/11/2010  . PAD (peripheral artery disease) (HCC) monitored by dr Rubye Oaks   moderate  . Peripheral vascular disease (HCC)   . Stroke (HCC)   . Unspecified hereditary and idiopathic peripheral neuropathy   . Urge urinary incontinence    Past Surgical History:  Procedure Laterality Date  . CAROTID ENDARTERECTOMY Left 11-18-2007  . CATARACT EXTRACTION W/ INTRAOCULAR LENS IMPLANT Right 11/2012  .  COLONOSCOPY  2005   mild diverticulosis  . CYSTOSCOPY WITH INJECTION N/A 09/06/2013   Procedure: CYSTOSCOPY WITH BOTOX  INJECTION;  Surgeon: Martina Sinner, MD;  Location: [facility];  Service: Urology;  Laterality: N/A;  . DILATION AND CURETTAGE OF UTERUS  1973    Allergies  Allergen Reactions  . Tramadol Nausea Only      Medication List       Accurate as of 04/24/16  2:59 PM. Always use your most recent med list.          acetaminophen 325 MG tablet Commonly known as:  TYLENOL Take 650 mg by mouth See admin instructions. Take 2 tablets (650 mg) by mouth  3 times daily, may also take 2 tablets (650 mg) every 6 hours as needed for pain   amLODipine 10 MG tablet Commonly known as:  NORVASC Take 10 mg by mouth daily. for blood pressure   atorvastatin 20 MG tablet Commonly known as:  LIPITOR Take 1 tablet (20 mg total) by mouth daily at 6 PM.   BIOFREEZE EX Apply 1 application topically as needed. To neck and knees and twice daily as needed to any affected joint.   clopidogrel 75 MG tablet Commonly known as:  PLAVIX Take 75 mg by mouth daily.   eszopiclone 1 MG Tabs tablet Commonly known as:  LUNESTA Take 1 mg by mouth at bedtime. Take immediately before bedtime   HYDROcodone-acetaminophen 5-325 MG tablet Commonly known as:  NORCO/VICODIN Take 1 tablet by mouth every 6 (six) hours as needed for moderate pain.   MINTOX 200-200-20 MG/5ML suspension Generic drug:  alum & mag hydroxide-simeth Take 15 mLs by mouth at bedtime. And prn indigestion   MYRBETRIQ 50 MG Tb24 tablet Generic drug:  mirabegron ER Take 100 mg by mouth at bedtime.   pantoprazole 40 MG tablet Commonly known as:  PROTONIX Take 40 mg by mouth daily.   polyethylene glycol powder powder Commonly known as:  GLYCOLAX/MIRALAX Take 17 g by mouth daily as needed (constipation). Mix in 8 oz liquid and drink   SENNA S 8.6-50 MG tablet Generic drug:  senna-docusate Take 2 tablets by mouth at bedtime.   SUPER B COMPLEX/C PO Take 2 tablets by mouth daily.   SYSTANE 0.4-0.3 % Gel ophthalmic gel Generic drug:  Polyethyl Glycol-Propyl Glycol Place 1 application into both eyes 4 (four) times daily.       Review of Systems  Constitutional: Negative for activity change, appetite change, chills, diaphoresis, fatigue, fever and unexpected weight change.  HENT: Negative for congestion, ear pain, rhinorrhea, sinus pain, sore throat, trouble swallowing and voice change.   Respiratory: Positive for cough (chronic non-productive intermittent cough). Negative for choking,  chest tightness, shortness of breath and wheezing.   Cardiovascular: Negative for chest pain, palpitations and leg swelling.  Gastrointestinal: Negative for abdominal distention, abdominal pain, constipation, diarrhea, nausea and vomiting.       Occasional reflux  Genitourinary: Negative for difficulty urinating and dysuria.  Musculoskeletal: Positive for arthralgias (arthritis in neck), back pain and gait problem. Negative for joint swelling and myalgias.  Neurological: Positive for weakness and headaches. Negative for dizziness, tremors, seizures, syncope, facial asymmetry, speech difficulty, light-headedness and numbness.  Psychiatric/Behavioral: Positive for sleep disturbance. Negative for agitation and behavioral problems. The patient is not nervous/anxious.     Immunization History  Administered Date(s) Administered  . Influenza Inj Mdck Quad Pf 03/13/2016  . Influenza-Unspecified 03/09/2013, 03/01/2014, 03/08/2015  . PPD Test 12/04/2011  .  Pneumococcal Conjugate-13 09/12/2014  . Pneumococcal Polysaccharide-23 09/12/2015   Pertinent  Health Maintenance Due  Topic Date Due  . INFLUENZA VACCINE  Completed  . PNA vac Low Risk Adult  Completed   Fall Risk  02/18/2016 09/12/2015 03/21/2015 03/13/2014 02/28/2013  Falls in the past year? No No No No Yes  Number falls in past yr: - - - - 1  Injury with Fall? - - - - Yes   Functional Status Survey:   Wt Readings from Last 3 Encounters:  04/24/16 161 lb 8 oz (73.3 kg)  03/20/16 161 lb 8 oz (73.3 kg)  02/18/16 161 lb 8 oz (73.3 kg)    Temp Readings from Last 3 Encounters:  04/24/16 97.5 F (36.4 C)  04/14/16 97.4 F (36.3 C)  03/20/16 97.4 F (36.3 C)   BP Readings from Last 3 Encounters:  04/24/16 122/68  04/21/16 134/61  04/14/16 (!) 118/58   Pulse Readings from Last 3 Encounters:  04/24/16 (!) 59  04/21/16 66  04/14/16 67    Physical Exam  Constitutional: She is oriented to person, place, and time. She appears  well-developed and well-nourished. No distress.  HENT:  Head: Normocephalic and atraumatic.  Eyes: Conjunctivae are normal. Pupils are equal, round, and reactive to light.  Neck: No JVD present.  Cardiovascular: Normal rate, regular rhythm and intact distal pulses.  Exam reveals no gallop and no friction rub.   No murmur heard. No edema.  Pulmonary/Chest: Effort normal and breath sounds normal. No respiratory distress. She has no wheezes. She has no rales. She exhibits no tenderness.  Clear bilat  Abdominal: Soft. Bowel sounds are normal. She exhibits no distension. There is no tenderness.  Musculoskeletal: She exhibits no edema or tenderness.  LUE 4/5, LLE 3/5.  RUE 5/5 RLE 5/5.  Crepitus noted to both knees. Decrease ROM to neck. Kyphosis noted.   Neurological: She is alert and oriented to person, place, and time. No cranial nerve deficit.  Alert to self, place and situation. No resting tremor or bradykinesia noted  Restless legs when elevated  Skin: Skin is warm and dry. She is not diaphoretic.  Psychiatric: She has a normal mood and affect.  Nursing note and vitals reviewed.   Labs reviewed: Lab Results  Component Value Date   HGBA1C 6.0 (H) 10/14/2015     Recent Labs  10/01/15 1959 10/13/15 0851 10/13/15 0914  NA 137 139 140  K 3.8 4.0 3.9  CL  --  102 100*  CO2  --  27  --   GLUCOSE  --  112* 107*  BUN 22* 17 19  CREATININE 0.7 0.82 0.80  CALCIUM  --  9.4  --     Recent Labs  09/28/15 10/13/15 0851  AST 14 16  ALT 13 18  ALKPHOS 59 60  BILITOT  --  0.6  PROT  --  6.8  ALBUMIN  --  3.7    Recent Labs  09/28/15 10/01/15 1959 10/13/15 0851 10/13/15 0914  WBC 7.1 6.1 6.1  --   NEUTROABS  --   --  4.7  --   HGB 11.8* 11.8* 13.0 15.0  HCT 36 38 40.3 44.0  MCV  --   --  85.4  --   PLT 227 267 298  --    Lab Results  Component Value Date   TSH 3.16 09/20/2015   Lab Results  Component Value Date   HGBA1C 6.0 (H) 10/14/2015   Lab Results  Component Value Date   CHOL 181 10/14/2015   HDL 57 10/14/2015   LDLCALC 108 (H) 10/14/2015   TRIG 79 10/14/2015   CHOLHDL 3.2 10/14/2015    Significant Diagnostic Results in last 30 days:  No results found.  Assessment/Plan  1. Chronic midline low back pain without sciatica Long term issue, pain is mild, worse at night which may be due to OA Schedule 1 dose of norco prior to bed Continue scheduled tylenol ?? If there is any spinal stenosis given her left leg weakness (which may be related to her CVA) but given her age and debility would not pursue additional work up unless pain is worsening/not improving No dexa scan given the fact that she does not bear weight regularly  2. Essential hypertension Controlled Continue norvasc 10 mgqd  3. PVD (peripheral vascular disease) (HCC) No symptoms at this time, no wounds F/U ABI due with vascular in April LDL 108, above goal but given her advanced age and goals of care would not be aggressive Will recheck in annually and continue to monitor  4. Primary insomnia Stable Continue lunesta 1 mg qd    Family/ staff Communication: discussed with resident  Labs/tests ordered: NA  Peggye Leyhristy Darrly Loberg, ANP [facility] [phone]

## 2016-05-05 ENCOUNTER — Encounter: Payer: Self-pay | Admitting: Adult Health

## 2016-05-05 ENCOUNTER — Non-Acute Institutional Stay (SKILLED_NURSING_FACILITY): Payer: Medicare Other | Admitting: Adult Health

## 2016-05-05 DIAGNOSIS — I739 Peripheral vascular disease, unspecified: Secondary | ICD-10-CM | POA: Diagnosis not present

## 2016-05-05 DIAGNOSIS — M25572 Pain in left ankle and joints of left foot: Secondary | ICD-10-CM

## 2016-05-05 DIAGNOSIS — M79672 Pain in left foot: Secondary | ICD-10-CM | POA: Diagnosis not present

## 2016-05-05 NOTE — Progress Notes (Signed)
Location:  Medical illustrator of Service:  SNF (31) Provider:   Peggye Ley, ANP [facility] [phone]   REED, Elmarie Shiley, DO  Patient Care Team: [name], DO as PCP - General (Geriatric Medicine) Well [facility] Fletcher Anon, NP as Nurse Practitioner (Nurse Practitioner)  Extended Emergency Contact Information Primary Emergency Contact: Rogelio Seen [address] [address] of [place] Home Phone: [phone] Mobile Phone: [phone] Relation: Daughter Secondary Emergency Contact: Reed,Tilden  [place] of [place] Home Phone: [phone] Mobile Phone: [phone] Relation: Relative  Code Status:  DNR Goals of care: Advanced Directive information Advanced Directives 04/24/2016  Does Patient Have a Medical Advance Directive? Yes  Type of Advance Directive Out of facility DNR (pink MOST or yellow form);Healthcare Power of [place];Living will  Does patient want to make changes to medical advance directive? -  Copy of Healthcare Power of Attorney in Chart? -  Pre-existing out of facility DNR order (yellow form or pink MOST form) Yellow form placed in chart (order not valid for inpatient use)     Chief Complaint  Patient presents with  . Acute Visit    left ankle pain    HPI:  Pt is a 82 y.o. female seen today for an acute visit for left ankle pain present for two days.  No trauma, swelling, or bruising. Hx of PVD with moderate occlusive dz in both legs.  Pain does not worsen with weight bearing.  Also has restless legs and hx of neuropathy. Denies numbness, tingling.   Past Medical History:  Diagnosis Date  . Abnormality of gait   . Allergic rhinitis   . Anxiety   . Arthritis   . Carotid stenosis, right    asymptomatic  <40%  right  ica  and right eca  . Carpal tunnel syndrome on both sides   . Claudication, intermittent (HCC)   . COPD (chronic obstructive pulmonary disease) (HCC)     . Diverticulosis of colon   . Fracture, calcaneus closed   . History of esophagitis   . History of hypothyroidism   . Hypertension   . Internal hemorrhoids   . Irritable bowel syndrome 03/11/2010  . PAD (peripheral artery disease) (HCC) monitored by dr Rubye Oaks   moderate  . Peripheral vascular disease (HCC)   . Stroke (HCC)   . Unspecified hereditary and idiopathic peripheral neuropathy   . Urge urinary incontinence    Past Surgical History:  Procedure Laterality Date  . CAROTID ENDARTERECTOMY Left 11-18-2007  . CATARACT EXTRACTION W/ INTRAOCULAR LENS IMPLANT Right 11/2012  . COLONOSCOPY  2005   mild diverticulosis  . CYSTOSCOPY WITH INJECTION N/A 09/06/2013   Procedure: CYSTOSCOPY WITH BOTOX  INJECTION;  Surgeon: Martina Sinner, MD;  Location: [facility];  Service: Urology;  Laterality: N/A;  . DILATION AND CURETTAGE OF UTERUS  1973    Allergies  Allergen Reactions  . Tramadol Nausea Only      Medication List       Accurate as of 05/05/16  3:38 PM. Always use your most recent med list.          acetaminophen 325 MG tablet Commonly known as:  TYLENOL Take 650 mg by mouth See admin instructions. Take 2 tablets (650 mg) by mouth 3 times daily, may also take 2 tablets (650 mg) every 6 hours as needed for pain   amLODipine 10 MG tablet Commonly known as:  NORVASC Take 10 mg by mouth daily. for blood pressure   atorvastatin 20 MG tablet Commonly known as:  LIPITOR Take 1 tablet (20 mg total) by mouth daily at 6 PM.   BIOFREEZE EX Apply 1 application topically as needed. To neck and knees and twice daily as needed to any affected joint.   clopidogrel 75 MG tablet Commonly known as:  PLAVIX Take 75 mg by mouth daily.   eszopiclone 1 MG Tabs tablet Commonly known as:  LUNESTA Take 1 mg by mouth at bedtime. Take immediately before bedtime   HYDROcodone-acetaminophen 5-325 MG tablet Commonly known as:  NORCO/VICODIN Take 1 tablet by mouth  every 6 (six) hours as needed for moderate pain.   MINTOX 200-200-20 MG/5ML suspension Generic drug:  alum & mag hydroxide-simeth Take 15 mLs by mouth at bedtime. And prn indigestion   MYRBETRIQ 50 MG Tb24 tablet Generic drug:  mirabegron ER Take 100 mg by mouth at bedtime.   pantoprazole 40 MG tablet Commonly known as:  PROTONIX Take 40 mg by mouth daily.   polyethylene glycol powder powder Commonly known as:  GLYCOLAX/MIRALAX Take 17 g by mouth daily as needed (constipation). Mix in 8 oz liquid and drink   SENNA S 8.6-50 MG tablet Generic drug:  senna-docusate Take 2 tablets by mouth at bedtime.   SUPER B COMPLEX/C PO Take 2 tablets by mouth daily.   SYSTANE 0.4-0.3 % Gel ophthalmic gel Generic drug:  Polyethyl Glycol-Propyl Glycol Place 1 application into both eyes 4 (four) times daily.       Review of Systems  Constitutional: Negative for activity change, appetite change and fever.  Respiratory: Negative for shortness of breath.   Cardiovascular: Negative for chest pain and leg swelling.  Musculoskeletal: Positive for arthralgias, back pain, gait problem and neck pain. Negative for joint swelling and myalgias.  Neurological: Negative for dizziness and numbness.       Restless legs    Immunization History  Administered Date(s) Administered  . Influenza Inj Mdck Quad Pf 03/13/2016  . Influenza-Unspecified 03/09/2013, 03/01/2014, 03/08/2015  . PPD Test 12/04/2011  . Pneumococcal Conjugate-13 09/12/2014  . Pneumococcal Polysaccharide-23 09/12/2015   Pertinent  Health Maintenance Due  Topic Date Due  . INFLUENZA VACCINE  Completed  . PNA vac Low Risk Adult  Completed   Fall Risk  02/18/2016 09/12/2015 03/21/2015 03/13/2014 02/28/2013  Falls in the past year? No No No No Yes  Number falls in past yr: - - - - 1  Injury with Fall? - - - - Yes   Functional Status Survey:    Vitals:   05/05/16 1536  BP: 118/78  Pulse: (!) 58  Resp: 18  Temp: 97.2 F (36.2 C)   SpO2: 96%   There is no height or weight on file to calculate BMI. Physical Exam  Constitutional: She is oriented to person, place, and time. No distress.  Cardiovascular: Normal rate and regular rhythm.   No murmur heard. Faint pedal pulse to bilat pedal area.    Pulmonary/Chest: Effort normal and breath sounds normal.  Abdominal: Soft. Bowel sounds are normal.  Musculoskeletal: She exhibits no edema. Tenderness: palpation of lateral and medial malleolus.  Normal sensation to left foot  Neurological: She is alert and oriented to person, place, and time.  Skin: Skin is warm and dry. She is not diaphoretic. No erythema.  Psychiatric: She has a normal mood and affect.  Nursing note and vitals reviewed.   Labs reviewed:  Recent Labs  10/01/15  1959 10/13/15 0851 10/13/15 0914  NA 137 139 140  K 3.8 4.0 3.9  CL  --  102 100*  CO2  --  27  --   GLUCOSE  --  112* 107*  BUN 22* 17 19  CREATININE 0.7 0.82 0.80  CALCIUM  --  9.4  --     Recent Labs  09/28/15 10/13/15 0851  AST 14 16  ALT 13 18  ALKPHOS 59 60  BILITOT  --  0.6  PROT  --  6.8  ALBUMIN  --  3.7    Recent Labs  09/28/15 10/01/15 1959 10/13/15 0851 10/13/15 0914  WBC 7.1 6.1 6.1  --   NEUTROABS  --   --  4.7  --   HGB 11.8* 11.8* 13.0 15.0  HCT 36 38 40.3 44.0  MCV  --   --  85.4  --   PLT 227 267 298  --    Lab Results  Component Value Date   TSH 3.16 09/20/2015   Lab Results  Component Value Date   HGBA1C 6.0 (H) 10/14/2015   Lab Results  Component Value Date   CHOL 181 10/14/2015   HDL 57 10/14/2015   LDLCALC 108 (H) 10/14/2015   TRIG 79 10/14/2015   CHOLHDL 3.2 10/14/2015    Significant Diagnostic Results in last 30 days:  No results found.  Assessment/Plan  1) Left ankle pain -new onset no reported trauma -pain with ROM, will check xray -exam unrevealing -also has hx of neuropathy and restless legs -Neurontin 100 mg TID  2) PVD -has a hx of moderate occlusive disease  which could be contributing to her symptoms -follow up ABI in April -no wounds -continue to monitor    Family/ staff Communication: discussed with staff  Labs/tests ordered:  Xray left ankle

## 2016-05-12 ENCOUNTER — Encounter: Payer: Self-pay | Admitting: Adult Health

## 2016-05-12 ENCOUNTER — Non-Acute Institutional Stay (SKILLED_NURSING_FACILITY): Payer: Medicare Other | Admitting: Adult Health

## 2016-05-12 DIAGNOSIS — R4182 Altered mental status, unspecified: Secondary | ICD-10-CM | POA: Diagnosis not present

## 2016-05-12 DIAGNOSIS — N390 Urinary tract infection, site not specified: Secondary | ICD-10-CM | POA: Diagnosis not present

## 2016-05-12 DIAGNOSIS — Z79899 Other long term (current) drug therapy: Secondary | ICD-10-CM | POA: Diagnosis not present

## 2016-05-12 DIAGNOSIS — N3000 Acute cystitis without hematuria: Secondary | ICD-10-CM | POA: Diagnosis not present

## 2016-05-12 DIAGNOSIS — R531 Weakness: Secondary | ICD-10-CM | POA: Diagnosis not present

## 2016-05-12 DIAGNOSIS — R5381 Other malaise: Secondary | ICD-10-CM | POA: Diagnosis not present

## 2016-05-12 NOTE — Progress Notes (Signed)
[facility]   Place of Service:  SNF (31) Provider:   Peggye Leyhristy Eeshan Verbrugge, ANP [facility] [phone]   Tara Walters, Tara ShileyIFFANY, DO  Patient Care Team: Kermit Baloiffany L Reed, DO as PCP - General (Geriatric Medicine) [facility] [place] Anonhristina Chandni Gagan, NP as Nurse Practitioner (Nurse Practitioner)  Extended Emergency Contact Information Primary Emergency Contact: [name],April          [place] [id number] [place] Home Phone: [phone] Mobile Phone: [phone] Relation: Daughter Secondary Emergency Contact: Tara Walters,[place]  [place] Home Phone: [phone] Mobile Phone: [phone] Relation: Relative  Code Status:  DNR Goals of care: Advanced Directive information Advanced Directives 04/24/2016  Does Patient Have a Medical Advance Directive? Yes  Type of Advance Directive Out of facility DNR (pink MOST or yellow form);Healthcare Power of [place];Living will  Does patient want to make changes to medical advance directive? -  Copy of Healthcare Power of Attorney in Chart? -  Pre-existing out of facility DNR order (yellow form or pink MOST form) Yellow form placed in chart (order not valid for inpatient use)     Chief Complaint  Patient presents with  . Acute Visit    weak, dysuria    HPI:  Pt is a 82 y.o. female seen today for an acute visit for dysuria and weakness.  Staff have reported that she had tremors/shakiness upon standing over the past few days.  The resident reports dysuria and frequency.  She is more confused today and requiring the stand up lift.  She has a slight cough present for one day. NO fever present. She was started on Neurontin last week for foot pain but this was discontinued on 12/15 due to the tremulous episodes.  These episodes were present prior to starting the Neurontin and were only present when standing up and therefore they were attributed to weakness/deconditioning. No reports  of focal deficit. UA was obtained showing bacteria too numerous to count but as a clean catch.   Past Medical History:  Diagnosis Date  . Abnormality of gait   . Allergic rhinitis   . Anxiety   . Arthritis   . Carotid stenosis, right    asymptomatic  <40%  right  ica  and right eca  . Carpal tunnel syndrome on both sides   . Claudication, intermittent (HCC)   . COPD (chronic obstructive pulmonary disease) (HCC)   . Diverticulosis of colon   . Fracture, calcaneus closed   . History of esophagitis   . History of hypothyroidism   . Hypertension   . Internal hemorrhoids   . Irritable bowel syndrome 03/11/2010  . PAD (peripheral artery disease) (HCC) monitored by dr Rubye Oak[place]ickerson   moderate  . Peripheral vascular disease (HCC)   . Stroke (HCC)   . Unspecified hereditary and idiopathic peripheral neuropathy   . Urge urinary incontinence    Past Surgical History:  Procedure Laterality Date  . CAROTID ENDARTERECTOMY Left 11-18-2007  . CATARACT EXTRACTION W/ INTRAOCULAR LENS IMPLANT Right 11/2012  . COLONOSCOPY  2005   mild diverticulosis  . CYSTOSCOPY WITH INJECTION N/A 09/06/2013   Procedure: CYSTOSCOPY WITH BOTOX  INJECTION;  Surgeon: Martina SinnerScott A MacDiarmid, MD;  [facility];  Service: Urology;  Laterality: N/A;  . DILATION AND CURETTAGE OF UTERUS  1973    Allergies  Allergen Reactions  . Tramadol Nausea Only    Allergies as of 05/12/2016      Reactions   Tramadol Nausea Only  Medication List       Accurate as of 05/12/16  9:40 AM. Always use your most recent med list.          acetaminophen 325 MG tablet Commonly known as:  TYLENOL Take 650 mg by mouth See admin instructions. Take 2 tablets (650 mg) by mouth 3 times daily, may also take 2 tablets (650 mg) every 6 hours as needed for pain   amLODipine 10 MG tablet Commonly known as:  NORVASC Take 10 mg by mouth daily. for blood pressure   atorvastatin 20 MG tablet Commonly known as:   LIPITOR Take 1 tablet (20 mg total) by mouth daily at 6 PM.   BIOFREEZE EX Apply 1 application topically as needed. To neck and knees and twice daily as needed to any affected joint.   clopidogrel 75 MG tablet Commonly known as:  PLAVIX Take 75 mg by mouth daily.   eszopiclone 1 MG Tabs tablet Commonly known as:  LUNESTA Take 1 mg by mouth at bedtime. Take immediately before bedtime   HYDROcodone-acetaminophen 5-325 MG tablet Commonly known as:  NORCO/VICODIN Take 1 tablet by mouth every 6 (six) hours as needed for moderate pain.   MINTOX 200-200-20 MG/5ML suspension Generic drug:  alum & mag hydroxide-simeth Take 15 mLs by mouth at bedtime. And prn indigestion   MYRBETRIQ 50 MG Tb24 tablet Generic drug:  mirabegron ER Take 100 mg by mouth at bedtime.   pantoprazole 40 MG tablet Commonly known as:  PROTONIX Take 40 mg by mouth daily.   polyethylene glycol powder powder Commonly known as:  GLYCOLAX/MIRALAX Take 17 g by mouth daily as needed (constipation). Mix in 8 oz liquid and drink   SENNA S 8.6-50 MG tablet Generic drug:  senna-docusate Take 2 tablets by mouth at bedtime.   SUPER B COMPLEX/C PO Take 2 tablets by mouth daily.   SYSTANE 0.4-0.3 % Gel ophthalmic gel Generic drug:  Polyethyl Glycol-Propyl Glycol Place 1 application into both eyes 4 (four) times daily.       Review of Systems  Constitutional: Positive for activity change and fatigue. Negative for appetite change, chills, diaphoresis and fever.  HENT: Negative for congestion.   Respiratory: Positive for cough. Negative for shortness of breath.   Cardiovascular: Negative for chest pain and leg swelling.  Gastrointestinal: Negative for abdominal distention, constipation, diarrhea, nausea and vomiting.  Genitourinary: Positive for dysuria and frequency. Negative for difficulty urinating and flank pain.  Musculoskeletal: Positive for arthralgias, back pain, gait problem and neck pain. Negative for  joint swelling and myalgias.  Neurological: Positive for tremors and weakness. Negative for dizziness and numbness.       Restless legs  Psychiatric/Behavioral: Positive for confusion.    Immunization History  Administered Date(s) Administered  . Influenza Inj Mdck Quad Pf 03/13/2016  . Influenza-Unspecified 03/09/2013, 03/01/2014, 03/08/2015  . PPD Test 12/04/2011  . Pneumococcal Conjugate-13 09/12/2014  . Pneumococcal Polysaccharide-23 09/12/2015   Pertinent  Health Maintenance Due  Topic Date Due  . INFLUENZA VACCINE  Completed  . PNA vac Low Risk Adult  Completed   Fall Risk  02/18/2016 09/12/2015 03/21/2015 03/13/2014 02/28/2013  Falls in the past year? No No No No Yes  Number falls in past yr: - - - - 1  Injury with Fall? - - - - Yes   Functional Status Survey:    Vitals:   05/12/16 0938  BP: 130/78  Pulse: 82  Resp: 18  Temp: 98.8 F (37.1 C)  SpO2: 95%  There is no height or weight on file to calculate BMI. Physical Exam  Constitutional: No distress.  HENT:  Head: Normocephalic and atraumatic.  Eyes: Pupils are equal, round, and reactive to light.  Neck: No JVD present.  Cardiovascular: Normal rate and regular rhythm.   No murmur heard. Faint pedal pulse to bilat pedal area.    Pulmonary/Chest: Effort normal. No respiratory distress. She has no wheezes. She has no rales.  Rhonchi to left upper lobes, cleared on its own  Abdominal: Soft. Bowel sounds are normal. She exhibits no distension.  No cva and s/p tenderness  Musculoskeletal: She exhibits no tenderness.  Neurological: She is alert. No cranial nerve deficit.  Oriented to self, place, and situation, not time  Skin: Skin is warm and dry. She is not diaphoretic. No erythema.  Psychiatric: She has a normal mood and affect.  Nursing note and vitals reviewed.   Labs reviewed:  Recent Labs  10/01/15 1959 10/13/15 0851 10/13/15 0914  NA 137 139 140  K 3.8 4.0 3.9  CL  --  102 100*  CO2  --  27   --   GLUCOSE  --  112* 107*  BUN 22* 17 19  CREATININE 0.7 0.82 0.80  CALCIUM  --  9.4  --     Recent Labs  09/28/15 10/13/15 0851  AST 14 16  ALT 13 18  ALKPHOS 59 60  BILITOT  --  0.6  PROT  --  6.8  ALBUMIN  --  3.7    Recent Labs  09/28/15 10/01/15 1959 10/13/15 0851 10/13/15 0914  WBC 7.1 6.1 6.1  --   NEUTROABS  --   --  4.7  --   HGB 11.8* 11.8* 13.0 15.0  HCT 36 38 40.3 44.0  MCV  --   --  85.4  --   PLT 227 267 298  --    Lab Results  Component Value Date   TSH 3.16 09/20/2015   Lab Results  Component Value Date   HGBA1C 6.0 (H) 10/14/2015   Lab Results  Component Value Date   CHOL 181 10/14/2015   HDL 57 10/14/2015   LDLCALC 108 (H) 10/14/2015   TRIG 79 10/14/2015   CHOLHDL 3.2 10/14/2015    Significant Diagnostic Results in last 30 days:  No results found.  Assessment/Plan  1) UTI Ceftin 250 mg BID with florastor BID for 7 days  2) Weakness Due to general function decline and confounding UTI   Peggye Leyhristy Azlan Hanway, ANP [facility] [phone]

## 2016-06-03 ENCOUNTER — Non-Acute Institutional Stay (SKILLED_NURSING_FACILITY): Payer: Medicare Other | Admitting: Internal Medicine

## 2016-06-03 ENCOUNTER — Encounter: Payer: Self-pay | Admitting: Internal Medicine

## 2016-06-03 DIAGNOSIS — M5030 Other cervical disc degeneration, unspecified cervical region: Secondary | ICD-10-CM

## 2016-06-03 DIAGNOSIS — I739 Peripheral vascular disease, unspecified: Secondary | ICD-10-CM | POA: Diagnosis not present

## 2016-06-03 DIAGNOSIS — M159 Polyosteoarthritis, unspecified: Secondary | ICD-10-CM | POA: Diagnosis not present

## 2016-06-03 DIAGNOSIS — I679 Cerebrovascular disease, unspecified: Secondary | ICD-10-CM | POA: Diagnosis not present

## 2016-06-03 DIAGNOSIS — F5101 Primary insomnia: Secondary | ICD-10-CM

## 2016-06-03 DIAGNOSIS — G6289 Other specified polyneuropathies: Secondary | ICD-10-CM | POA: Diagnosis not present

## 2016-06-03 NOTE — Progress Notes (Signed)
Patient ID: Tara BeckJanet F Proehl, female   DOB: Sep 03, 1921, 83 y.o.   MRN: [id number]  Location:  [facility] Room Number: 120 Place of Service:  SNF (31) Provider:  Elwyn Klosinski L. Renato Gailseed, D.O., C.M.D.  Bufford SpikesEED, Gabriella Woodhead, DO  Patient Care Team: Kermit Baloiffany L Marilea Gwynne, DO as PCP - General (Geriatric Medicine) [facility] [place] Anonhristina Wert, NP as Nurse Practitioner (Nurse Practitioner)  Extended Emergency Contact Information Primary Emergency Contact: [name],April          [place] [id number] [place] Home Phone: [phone] Mobile Phone: [phone] Relation: Daughter Secondary Emergency Contact: Melea Prezioso,[place]  [place] Home Phone: [phone] Mobile Phone: [phone] Relation: Relative  Code Status:  DNR Goals of care: Advanced Directive information Advanced Directives 06/03/2016  Does Patient Have a Medical Advance Directive? -  Type of Advance Directive Out of facility DNR (pink MOST or yellow form);Living will;Healthcare Power of Attorney  Does patient want to make changes to medical advance directive? -  Copy of Healthcare Power of Attorney in Chart? Yes  Pre-existing out of facility DNR order (yellow form or pink MOST form) Yellow form placed in chart (order not valid for inpatient use)     Chief Complaint  Patient presents with  . Medical Management of Chronic Issues    routine visit    HPI:  Pt is a 83 y.o. female seen today for medical management of chronic diseases.    She is now adjusted to SNF level care.  She seems happier again.    She has chronic arthritis pains, but is tolerating them. She now has hydrocodone not just at bedtime, but q6h prn.  She may have more advanced cervical DDD than in 2012 and some stenosis, but it's not going to alter treatment if we get an MRI b/c she has never wanted surgery or really medications for her problems when we've discussed it in detail in clinic over the past 2  years.    She recovered from a UTI last month txed with ceftin and florastor.  Her strength has gradually returned again.    She was started on gabapentin for her left ankle pain which was felt to possibly be more neuropathy and RLS.  She previously did not tolerate gabapentin due to excessive sleepiness but seems to be doing better this time. F/u ABI was also planned for April due to PAD.    Past Medical History:  Diagnosis Date  . Abnormality of gait   . Allergic rhinitis   . Anxiety   . Arthritis   . Carotid stenosis, right    asymptomatic  <40%  right  ica  and right eca  . Carpal tunnel syndrome on both sides   . Claudication, intermittent (HCC)   . COPD (chronic obstructive pulmonary disease) (HCC)   . Diverticulosis of colon   . Fracture, calcaneus closed   . History of esophagitis   . History of hypothyroidism   . Hypertension   . Internal hemorrhoids   . Irritable bowel syndrome 03/11/2010  . PAD (peripheral artery disease) (HCC) monitored by dr Rubye [place]   moderate  . Peripheral vascular disease (HCC)   . Stroke (HCC)   . Unspecified hereditary and idiopathic peripheral neuropathy   . Urge urinary incontinence    Past Surgical History:  Procedure Laterality Date  . CAROTID ENDARTERECTOMY Left 11-18-2007  . CATARACT EXTRACTION W/ INTRAOCULAR LENS IMPLANT Right 11/2012  . COLONOSCOPY  2005   mild diverticulosis  .  CYSTOSCOPY WITH INJECTION N/A 09/06/2013   Procedure: CYSTOSCOPY WITH BOTOX  INJECTION;  Surgeon: Martina Sinner, MD;  Location: [facility];  Service: Urology;  Laterality: N/A;  . DILATION AND CURETTAGE OF UTERUS  1973    Allergies  Allergen Reactions  . Tramadol Nausea Only    Allergies as of 06/03/2016      Reactions   Tramadol Nausea Only      Medication List       Accurate as of 06/03/16  2:12 PM. Always use your most recent med list.          acetaminophen 325 MG tablet Commonly known as:  TYLENOL Take 650 mg by  mouth See admin instructions. Take 2 tablets (650 mg) by mouth 3 times daily, may also take 2 tablets (650 mg) every 6 hours as needed for pain   amLODipine 10 MG tablet Commonly known as:  NORVASC Take 10 mg by mouth daily. for blood pressure   atorvastatin 20 MG tablet Commonly known as:  LIPITOR Take 1 tablet (20 mg total) by mouth daily at 6 PM.   BIOFREEZE EX Apply 1 application topically as needed. To neck and knees and twice daily as needed to any affected joint.   clopidogrel 75 MG tablet Commonly known as:  PLAVIX Take 75 mg by mouth daily.   eszopiclone 1 MG Tabs tablet Commonly known as:  LUNESTA Take 1 mg by mouth at bedtime. Take immediately before bedtime   HYDROcodone-acetaminophen 5-325 MG tablet Commonly known as:  NORCO/VICODIN Take 1 tablet by mouth every 6 (six) hours as needed for moderate pain.   MINTOX 200-200-20 MG/5ML suspension Generic drug:  alum & mag hydroxide-simeth Take 15 mLs by mouth at bedtime. And prn indigestion   MYRBETRIQ 50 MG Tb24 tablet Generic drug:  mirabegron ER Take 100 mg by mouth at bedtime.   pantoprazole 40 MG tablet Commonly known as:  PROTONIX Take 40 mg by mouth daily.   polyethylene glycol powder powder Commonly known as:  GLYCOLAX/MIRALAX Take 17 g by mouth daily as needed (constipation). Mix in 8 oz liquid and drink   SENNA S 8.6-50 MG tablet Generic drug:  senna-docusate Take 2 tablets by mouth at bedtime.   SUPER B COMPLEX/C PO Take 2 tablets by mouth daily.   SYSTANE 0.4-0.3 % Gel ophthalmic gel Generic drug:  Polyethyl Glycol-Propyl Glycol Place 1 application into both eyes 4 (four) times daily.       Review of Systems  Constitutional: Negative for activity change, appetite change, chills, fatigue, fever and unexpected weight change.  HENT: Negative for congestion.   Respiratory: Negative for chest tightness and shortness of breath.   Cardiovascular: Negative for chest pain, palpitations and leg  swelling.  Gastrointestinal: Negative for abdominal pain.  Genitourinary: Negative for dysuria.       Has h/o OAB so constantly has some frequency and urgency and incontinence  Musculoskeletal: Positive for arthralgias, back pain, gait problem, neck pain and neck stiffness. Negative for joint swelling and myalgias.  Skin: Negative for color change.  Neurological: Positive for weakness. Negative for dizziness and speech difficulty.  Hematological: Bruises/bleeds easily.  Psychiatric/Behavioral: Positive for confusion.       Some confusion off and on    Immunization History  Administered Date(s) Administered  . Influenza Inj Mdck Quad Pf 03/13/2016  . Influenza-Unspecified 03/09/2013, 03/01/2014, 03/08/2015  . PPD Test 12/04/2011  . Pneumococcal Conjugate-13 09/12/2014  . Pneumococcal Polysaccharide-23 09/12/2015   Pertinent  Health  Maintenance Due  Topic Date Due  . INFLUENZA VACCINE  Completed  . PNA vac Low Risk Adult  Completed   Fall Risk  02/18/2016 09/12/2015 03/21/2015 03/13/2014 02/28/2013  Falls in the past year? No No No No Yes  Number falls in past yr: - - - - 1  Injury with Fall? - - - - Yes   Functional Status Survey:    Vitals:   06/03/16 1356  BP: 109/64  Pulse: 67  Resp: 12  Temp: 97.4 F (36.3 C)  TempSrc: Oral  SpO2: 96%  Weight: 167 lb (75.8 kg)   Body mass index is 27.79 kg/m. Physical Exam  Constitutional: She is oriented to person, place, and time. She appears well-developed and well-nourished. No distress.  Cardiovascular: Normal rate, regular rhythm and normal heart sounds.   Pulmonary/Chest: Effort normal and breath sounds normal. No respiratory distress. She has no wheezes. She has no rales.  Abdominal: Soft. Bowel sounds are normal. She exhibits no distension. There is no tenderness.  Musculoskeletal: Normal range of motion.  kyphosis  Neurological: She is alert and oriented to person, place, and time.  Skin: Skin is warm and dry.    Psychiatric: She has a normal mood and affect.    Labs reviewed:  Recent Labs  10/01/15 1959 10/13/15 0851 10/13/15 0914  NA 137 139 140  K 3.8 4.0 3.9  CL  --  102 100*  CO2  --  27  --   GLUCOSE  --  112* 107*  BUN 22* 17 19  CREATININE 0.7 0.82 0.80  CALCIUM  --  9.4  --     Recent Labs  09/28/15 10/13/15 0851  AST 14 16  ALT 13 18  ALKPHOS 59 60  BILITOT  --  0.6  PROT  --  6.8  ALBUMIN  --  3.7    Recent Labs  09/28/15 10/01/15 1959 10/13/15 0851 10/13/15 0914  WBC 7.1 6.1 6.1  --   NEUTROABS  --   --  4.7  --   HGB 11.8* 11.8* 13.0 15.0  HCT 36 38 40.3 44.0  MCV  --   --  85.4  --   PLT 227 267 298  --    Lab Results  Component Value Date   TSH 3.16 09/20/2015   Lab Results  Component Value Date   HGBA1C 6.0 (H) 10/14/2015   Lab Results  Component Value Date   CHOL 181 10/14/2015   HDL 57 10/14/2015   LDLCALC 108 (H) 10/14/2015   TRIG 79 10/14/2015   CHOLHDL 3.2 10/14/2015   Prior c spine xrays from 2012 reviewed with some cervical DDD then  Assessment/Plan 1. DDD (degenerative disc disease), cervical -see above -continues on hydrocodone now q6h -would like to avoid too many narcotics as she does not tolerate them well -prefer using the gabapentin more -MRI will not change mgt in her case--she has always refused interventions, workups etc since I've known her -she wants her symptoms treated and to be left alone  2. Generalized osteoarthritis of multiple sites -see #1 -used to have scheduled tylenol which helped -does not tolerate tramadol and gets very confused with narcotics   3. PVD (peripheral vascular disease) (HCC) -due for recheck of abis in april  4. Other polyneuropathy (HCC) -could be due in some part to cervical stenosis -titrate gabapentin to relief and renal tolerability  5. Primary insomnia -continues on lunesta--I've been unable to get her off a sleeping pill--been trying for  2 years  6. Cerebrovascular  disease -prior to her possible stroke, her cognition was quite good, so suspect her "dementia" is primarily vascular and worsened by lunesta and narcotics -continues on plavix, bp control,   Family/ staff Communication: discussed with snf nurse  Labs/tests ordered:  No new

## 2016-06-09 ENCOUNTER — Other Ambulatory Visit: Payer: Self-pay | Admitting: *Deleted

## 2016-06-09 MED ORDER — HYDROCODONE-ACETAMINOPHEN 5-325 MG PO TABS: ORAL_TABLET | ORAL | 0 refills | Status: DC

## 2016-06-09 NOTE — Telephone Encounter (Signed)
[facility] #[phone] Fax: [phone]  

## 2016-06-14 DIAGNOSIS — M5030 Other cervical disc degeneration, unspecified cervical region: Secondary | ICD-10-CM | POA: Insufficient documentation

## 2016-06-14 DIAGNOSIS — M159 Polyosteoarthritis, unspecified: Secondary | ICD-10-CM | POA: Insufficient documentation

## 2016-07-10 ENCOUNTER — Non-Acute Institutional Stay (SKILLED_NURSING_FACILITY): Payer: Medicare Other | Admitting: Adult Health

## 2016-07-10 ENCOUNTER — Encounter: Payer: Self-pay | Admitting: Adult Health

## 2016-07-10 DIAGNOSIS — I10 Essential (primary) hypertension: Secondary | ICD-10-CM | POA: Diagnosis not present

## 2016-07-10 DIAGNOSIS — N3281 Overactive bladder: Secondary | ICD-10-CM | POA: Diagnosis not present

## 2016-07-10 DIAGNOSIS — F0280 Dementia in other diseases classified elsewhere without behavioral disturbance: Secondary | ICD-10-CM | POA: Diagnosis not present

## 2016-07-10 DIAGNOSIS — F0150 Vascular dementia without behavioral disturbance: Secondary | ICD-10-CM | POA: Diagnosis not present

## 2016-07-10 DIAGNOSIS — G309 Alzheimer's disease, unspecified: Secondary | ICD-10-CM

## 2016-07-10 DIAGNOSIS — R635 Abnormal weight gain: Secondary | ICD-10-CM | POA: Diagnosis not present

## 2016-07-10 DIAGNOSIS — F5101 Primary insomnia: Secondary | ICD-10-CM

## 2016-07-10 DIAGNOSIS — M159 Polyosteoarthritis, unspecified: Secondary | ICD-10-CM | POA: Diagnosis not present

## 2016-07-10 NOTE — Progress Notes (Signed)
Patient ID: Tara Walters, female   DOB: June 18, 1921, 83 y.o.   MRN: [id number]   Location:   [facility] teacher of Service:  SNF (31)  Provider:   Peggye Ley, ANP [facility] [phone]   REED, Elmarie Shiley, DO  Patient Care Team: [name], DO as PCP - General (Geriatric Medicine) Well [facility] Fletcher Anon, NP as Nurse Practitioner (Nurse Practitioner)  Extended Emergency Contact Information Primary Emergency Contact: Rogelio Seen [address] [address] of [place] Home Phone: [phone] Mobile Phone: [phone] Relation: Daughter Secondary Emergency Contact: Reed,Tilden  [place] of [place] Home Phone: [phone] Mobile Phone: [phone] Relation: Relative   Code Status:  DNR Goals of care: Advanced Directive information Advanced Directives 07/10/2016  Does Patient Have a Medical Advance Directive? Yes  Type of Advance Directive Out of facility DNR (pink MOST or yellow form);Living will;Healthcare Power of Attorney  Does patient want to make changes to medical advance directive? -  Copy of Healthcare Power of Attorney in Chart? Yes  Pre-existing out of facility DNR order (yellow form or pink MOST form) Yellow form placed in chart (order not valid for inpatient use)     Chief Complaint  Patient presents with  . Medical Management of Chronic Issues    HPI:  83 y.o.female  residing at [facility], skilled care. I am here to review her chronic medical issues.  VS have been stable over the past month. Weight has been trending upward, 10 lbs in the past few months.    She has chronic pain to her low back and neck area that she reports is from OA.    She has a hx of CVA with residual left leg weakness. Has some leg twitching that does not seem to bother her.  B12 and MG were checked and WNL.She remains on lipitor and Norvasc for BP control.  She has a hx of PVD but denies  lower ext leg pain or numbness.  Has carotid dopplers and ABI's due in April of 2018. 02/18/16 MMSE 24/30 poor recall She reports she is sleeping well and takes lunesta nightly.   Has urinary incontinence and uses myrbetriq.  She reports frequent urination as a baseline but that it does not keep her up at night.    Past Medical History:  Diagnosis Date  . Abnormality of gait   . Allergic rhinitis   . Anxiety   . Arthritis   . Carotid stenosis, right    asymptomatic  <40%  right  ica  and right eca  . Carpal tunnel syndrome on both sides   . Claudication, intermittent (HCC)   . COPD (chronic obstructive pulmonary disease) (HCC)   . Diverticulosis of colon   . Fracture, calcaneus closed   . History of esophagitis   . History of hypothyroidism   . Hypertension   . Internal hemorrhoids   . Irritable bowel syndrome 03/11/2010  . PAD (peripheral artery disease) (HCC) monitored by dr Rubye Oaks   moderate  . Peripheral vascular disease (HCC)   . Stroke (HCC)   . Unspecified hereditary and idiopathic peripheral neuropathy   . Urge urinary incontinence    Past Surgical History:  Procedure Laterality Date  . CAROTID ENDARTERECTOMY Left 11-18-2007  . CATARACT EXTRACTION W/ INTRAOCULAR LENS IMPLANT Right 11/2012  . COLONOSCOPY  2005   mild diverticulosis  . CYSTOSCOPY WITH INJECTION N/A 09/06/2013   Procedure: CYSTOSCOPY  WITH BOTOX  INJECTION;  Surgeon: Martina SinnerScott A MacDiarmid, MD;  [facility];  Service: Urology;  Laterality: N/A;  . DILATION AND CURETTAGE OF UTERUS  1973    Allergies  Allergen Reactions  . Tramadol Nausea Only    Allergies as of 07/10/2016      Reactions   Tramadol Nausea Only      Medication List       Accurate as of 07/10/16  2:31 PM. Always use your most recent med list.          acetaminophen 500 MG tablet Commonly known as:  TYLENOL Take 500 mg by mouth 2 (two) times daily.   amLODipine 10 MG tablet Commonly known as:   NORVASC Take 10 mg by mouth daily. for blood pressure   atorvastatin 20 MG tablet Commonly known as:  LIPITOR Take 1 tablet (20 mg total) by mouth daily at 6 PM.   BIOFREEZE EX Apply 1 application topically as needed. To neck and knees and twice daily as needed to any affected joint.   clopidogrel 75 MG tablet Commonly known as:  PLAVIX Take 75 mg by mouth daily.   eszopiclone 1 MG Tabs tablet Commonly known as:  LUNESTA Take 1 mg by mouth at bedtime. Take immediately before bedtime   HYDROcodone-acetaminophen 5-325 MG tablet Commonly known as:  NORCO/VICODIN Take one tablet by mouth nightly at 10pm continue as needed dosing not to exceed 3 grams of Tylenol/24 hours (control)   MINTOX 200-200-20 MG/5ML suspension Generic drug:  alum & mag hydroxide-simeth Take 15 mLs by mouth at bedtime. And prn indigestion   MYRBETRIQ 50 MG Tb24 tablet Generic drug:  mirabegron ER Take 100 mg by mouth at bedtime.   pantoprazole 40 MG tablet Commonly known as:  PROTONIX Take 40 mg by mouth daily.   polyethylene glycol powder powder Commonly known as:  GLYCOLAX/MIRALAX Take 17 g by mouth daily as needed (constipation). Mix in 8 oz liquid and drink   SENNA S 8.6-50 MG tablet Generic drug:  senna-docusate Take 2 tablets by mouth at bedtime.   SUPER B COMPLEX/C PO Take 2 tablets by mouth daily.   SYSTANE 0.4-0.3 % Gel ophthalmic gel Generic drug:  Polyethyl Glycol-Propyl Glycol Place 1 application into both eyes 4 (four) times daily.       Review of Systems  Constitutional: Positive for unexpected weight change. Negative for activity change, appetite change, chills, diaphoresis, fatigue and fever.  HENT: Negative for congestion, ear pain, rhinorrhea, sinus pain, sore throat, trouble swallowing and voice change.   Respiratory: Negative for apnea, cough (chronic non-productive intermittent cough), choking, chest tightness, shortness of breath and wheezing.   Cardiovascular: Negative  for chest pain, palpitations and leg swelling.  Gastrointestinal: Negative for abdominal distention, abdominal pain, constipation, diarrhea, nausea and vomiting.       Occasional reflux  Genitourinary: Negative for difficulty urinating and dysuria.  Musculoskeletal: Positive for arthralgias (arthritis in neck), back pain and gait problem. Negative for joint swelling and myalgias.  Neurological: Positive for weakness. Negative for dizziness, tremors, seizures, syncope, facial asymmetry, speech difficulty, light-headedness, numbness and headaches.  Psychiatric/Behavioral: Negative for agitation, behavioral problems and sleep disturbance. The patient is not nervous/anxious.        Memory loss    Immunization History  Administered Date(s) Administered  . DTaP 06/03/2016  . Influenza Inj Mdck Quad Pf 03/13/2016  . Influenza-Unspecified 03/09/2013, 03/01/2014, 03/08/2015  . PPD Test 12/04/2011  . Pneumococcal Conjugate-13 09/12/2014  . Pneumococcal  Polysaccharide-23 09/12/2015   Pertinent  Health Maintenance Due  Topic Date Due  . INFLUENZA VACCINE  Completed  . PNA vac Low Risk Adult  Completed   Fall Risk  02/18/2016 09/12/2015 03/21/2015 03/13/2014 02/28/2013  Falls in the past year? No No No No Yes  Number falls in past yr: - - - - 1  Injury with Fall? - - - - Yes   Functional Status Survey:   Wt Readings from Last 3 Encounters:  07/10/16 171 lb (77.6 kg)  06/03/16 167 lb (75.8 kg)  04/24/16 161 lb 8 oz (73.3 kg)    Temp Readings from Last 3 Encounters:  07/10/16 97.4 F (36.3 C)  06/03/16 97.4 F (36.3 C) (Oral)  05/12/16 98.8 F (37.1 C)   BP Readings from Last 3 Encounters:  07/10/16 120/71  06/03/16 109/64  05/12/16 130/78   Pulse Readings from Last 3 Encounters:  07/10/16 62  06/03/16 67  05/12/16 82    Physical Exam  Constitutional: She is oriented to person, place, and time. She appears well-developed and well-nourished. No distress.  HENT:  Head:  Normocephalic and atraumatic.  Eyes: Conjunctivae are normal. Pupils are equal, round, and reactive to light.  Neck: No JVD present.  Cardiovascular: Normal rate and regular rhythm.  Exam reveals no gallop and no friction rub.   No murmur heard. No edema.  Pulmonary/Chest: Effort normal. No respiratory distress. She has no wheezes. She has rales (fine bibasliar). She exhibits no tenderness.  Abdominal: Soft. Bowel sounds are normal. She exhibits no distension. There is no tenderness.  Musculoskeletal: She exhibits no edema or tenderness.  LUE 4/5, LLE 3/5.  RUE 5/5 RLE 5/5.  Crepitus noted to both knees. Decrease ROM to neck. Kyphosis noted.   Neurological: She is alert and oriented to person, place, and time. No cranial nerve deficit.  Alert to self, place and situation. No resting tremor or bradykinesia noted  Restless legs when elevated  Skin: Skin is warm and dry. She is not diaphoretic.  Psychiatric: She has a normal mood and affect.  Nursing note and vitals reviewed.   Labs reviewed: Lab Results  Component Value Date   HGBA1C 6.0 (H) 10/14/2015     Recent Labs  10/13/15 0851 10/13/15 0914 04/14/16  NA 139 140 140  K 4.0 3.9 4.3  CL 102 100*  --   CO2 27  --   --   GLUCOSE 112* 107*  --   BUN 17 19 25*  CREATININE 0.82 0.80 0.6  CALCIUM 9.4  --   --     Recent Labs  09/28/15 10/13/15 0851 04/14/16  AST 14 16 17   ALT 13 18 17   ALKPHOS 59 60 89  BILITOT  --  0.6  --   PROT  --  6.8  --   ALBUMIN  --  3.7  --     Recent Labs  10/01/15 1959 10/13/15 0851 10/13/15 0914 04/14/16  WBC 6.1 6.1  --  6.5  NEUTROABS  --  4.7  --   --   HGB 11.8* 13.0 15.0 13.0  HCT 38 40.3 44.0 40  MCV  --  85.4  --   --   PLT 267 298  --  181   Lab Results  Component Value Date   TSH 3.16 09/20/2015   Lab Results  Component Value Date   HGBA1C 6.0 (H) 10/14/2015   Lab Results  Component Value Date   CHOL 181 10/14/2015  HDL 57 10/14/2015   LDLCALC 108 (H)  10/14/2015   TRIG 79 10/14/2015   CHOLHDL 3.2 10/14/2015    Significant Diagnostic Results in last 30 days:  No results found.  Assessment/Plan  1. Essential hypertension Controlled Continue Norvasc 10 mg qd  2. Mixed Alzheimer's and vascular dementia Stable cognition, progressively weaker over time Would not initiate meds for memory at this time  3. OAB (overactive bladder) Controlled Continue myrbetriq 50 mg qd  4. Primary insomnia Continue lunesta 1 mg qhs  5. Generalized osteoarthritis of multiple sites Has pain periodically to hips, knees, neck She is content with her current regimen Continue scheduled tylenol and norco 1 tab qhs and prn  6. Weight gain Recommend smaller portions, currently on regular heart healthy diet   Family/ staff Communication: discussed with resident  Labs/tests ordered: NA  Peggye Ley, ANP [facility] [phone]

## 2016-08-15 ENCOUNTER — Encounter: Payer: Self-pay | Admitting: Family

## 2016-08-18 ENCOUNTER — Non-Acute Institutional Stay (SKILLED_NURSING_FACILITY): Payer: Medicare Other | Admitting: Adult Health

## 2016-08-18 ENCOUNTER — Encounter: Payer: Self-pay | Admitting: Adult Health

## 2016-08-18 DIAGNOSIS — K5901 Slow transit constipation: Secondary | ICD-10-CM | POA: Diagnosis not present

## 2016-08-18 DIAGNOSIS — I679 Cerebrovascular disease, unspecified: Secondary | ICD-10-CM

## 2016-08-18 DIAGNOSIS — I10 Essential (primary) hypertension: Secondary | ICD-10-CM | POA: Diagnosis not present

## 2016-08-18 DIAGNOSIS — G8929 Other chronic pain: Secondary | ICD-10-CM | POA: Diagnosis not present

## 2016-08-18 DIAGNOSIS — M545 Low back pain: Secondary | ICD-10-CM

## 2016-08-18 NOTE — Addendum Note (Signed)
Addended by: Burton ApleyPETTY, Eleny Cortez A on: 08/18/2016 04:51 PM   Modules accepted: Orders

## 2016-08-18 NOTE — Progress Notes (Signed)
Patient ID: Tara Walters, female   DOB: [birth year], 83 y.o.   MRN: [id number]   [facility] [employer] [facility] teacher of Service:  SNF (31)  Provider:   Peggye Ley, ANP [facility] [phone]   REED, Elmarie Shiley, DO  Patient Care Team: [name], DO as PCP - General (Geriatric Medicine) Well [facility] Fletcher Anon, NP as Nurse Practitioner (Nurse Practitioner)  Extended Emergency Contact Information Primary Emergency Contact: Rogelio Seen [address] [address] of [place] Home Phone: [phone] Mobile Phone: [phone] Relation: Daughter Secondary Emergency Contact: Reed,Tilden  [place] of [place] Home Phone: [phone] Mobile Phone: [phone] Relation: Relative   Code Status:  DNR Goals of care: Advanced Directive information Advanced Directives 07/10/2016  Does Patient Have a Medical Advance Directive? Yes  Type of Advance Directive Out of facility DNR (pink MOST or yellow form);Living will;Healthcare Power of Attorney  Does patient want to make changes to medical advance directive? -  Copy of Healthcare Power of Attorney in Chart? Yes  Pre-existing out of facility DNR order (yellow form or pink MOST form) Yellow form placed in chart (order not valid for inpatient use)     Chief Complaint  Patient presents with  . Medical Management of Chronic Issues    HPI:  83 y.o.female  residing at [facility], skilled care. I am here to review her chronic medical issues.  VS have been stable over the past month. Weight had been trending upward, now down 2 lbs with smaller portions ordered on her tray.    She has chronic pain to her low back and neck area that she reports is from OA.  She uses one norco at night and 4 x in the past two weeks she has woken up in the middle of night and needed to take a second dose due to pain in the lumbar area that is not radicular. She denies any  change in bowel or bladder habits.   She has a hx of CVA with residual left leg weakness.   She has a hx of PVD but denies lower ext leg pain or numbness.  Has carotid dopplers and ABI's due in April of 2018. 02/18/16 MMSE 24/30 poor recall  Lab Results  Component Value Date   LDLCALC 108 (H) 10/14/2015   Bp controlled with norvasc, see readings below.    Past Medical History:  Diagnosis Date  . Abnormality of gait   . Allergic rhinitis   . Anxiety   . Arthritis   . Carotid stenosis, right    asymptomatic  <40%  right  ica  and right eca  . Carpal tunnel syndrome on both sides   . Claudication, intermittent (HCC)   . COPD (chronic obstructive pulmonary disease) (HCC)   . Diverticulosis of colon   . Fracture, calcaneus closed   . History of esophagitis   . History of hypothyroidism   . Hypertension   . Internal hemorrhoids   . Irritable bowel syndrome 03/11/2010  . PAD (peripheral artery disease) (HCC) monitored by dr Rubye Oaks   moderate  . Peripheral vascular disease (HCC)   . Stroke (HCC)   . Unspecified hereditary and idiopathic peripheral neuropathy   . Urge urinary incontinence    Past Surgical History:  Procedure Laterality Date  . CAROTID ENDARTERECTOMY Left 11-18-2007  . CATARACT EXTRACTION W/ INTRAOCULAR LENS IMPLANT Right 11/2012  . COLONOSCOPY  2005  mild diverticulosis  . CYSTOSCOPY WITH INJECTION N/A 09/06/2013   Procedure: CYSTOSCOPY WITH BOTOX  INJECTION;  Surgeon: Martina SinnerScott A MacDiarmid, MD;  [facility];  Service: Urology;  Laterality: N/A;  . DILATION AND CURETTAGE OF UTERUS  1973    Allergies  Allergen Reactions  . Tramadol Nausea Only    Allergies as of 08/18/2016      Reactions   Tramadol Nausea Only      Medication List       Accurate as of 08/18/16 12:31 PM. Always use your most recent med list.          acetaminophen 500 MG tablet Commonly known as:  TYLENOL Take 500 mg by mouth 2 (two) times daily.     amLODipine 10 MG tablet Commonly known as:  NORVASC Take 10 mg by mouth daily. for blood pressure   atorvastatin 20 MG tablet Commonly known as:  LIPITOR Take 1 tablet (20 mg total) by mouth daily at 6 PM.   BIOFREEZE EX Apply 1 application topically as needed. To neck and knees and twice daily as needed to any affected joint.   clopidogrel 75 MG tablet Commonly known as:  PLAVIX Take 75 mg by mouth daily.   eszopiclone 1 MG Tabs tablet Commonly known as:  LUNESTA Take 1 mg by mouth at bedtime. Take immediately before bedtime   HYDROcodone-acetaminophen 5-325 MG tablet Commonly known as:  NORCO/VICODIN Take one tablet by mouth nightly at 10pm continue as needed dosing not to exceed 3 grams of Tylenol/24 hours (control)   MINTOX 200-200-20 MG/5ML suspension Generic drug:  alum & mag hydroxide-simeth Take 15 mLs by mouth at bedtime. And prn indigestion   MYRBETRIQ 50 MG Tb24 tablet Generic drug:  mirabegron ER Take 50 mg by mouth at bedtime.   pantoprazole 40 MG tablet Commonly known as:  PROTONIX Take 40 mg by mouth daily.   polyethylene glycol powder powder Commonly known as:  GLYCOLAX/MIRALAX Take 17 g by mouth every other day. Mix in 8 oz liquid and drink   SENNA S 8.6-50 MG tablet Generic drug:  senna-docusate Take 2 tablets by mouth at bedtime.   SUPER B COMPLEX/C PO Take 2 tablets by mouth daily.   SYSTANE 0.4-0.3 % Gel ophthalmic gel Generic drug:  Polyethyl Glycol-Propyl Glycol Place 1 application into both eyes 4 (four) times daily.       Review of Systems  Constitutional: Negative for activity change, appetite change, chills, diaphoresis, fatigue, fever and unexpected weight change.  HENT: Negative for congestion, ear pain, rhinorrhea, sinus pain, sore throat, trouble swallowing and voice change.   Respiratory: Negative for apnea, cough (chronic non-productive intermittent cough), choking, chest tightness, shortness of breath and wheezing.    Cardiovascular: Negative for chest pain, palpitations and leg swelling.  Gastrointestinal: Negative for abdominal distention, abdominal pain, constipation, diarrhea, nausea and vomiting.       Occasional reflux  Genitourinary: Negative for difficulty urinating and dysuria.  Musculoskeletal: Positive for arthralgias (arthritis in neck), back pain and gait problem. Negative for joint swelling and myalgias.  Neurological: Positive for weakness. Negative for dizziness, tremors, seizures, syncope, facial asymmetry, speech difficulty, light-headedness, numbness and headaches.  Psychiatric/Behavioral: Negative for agitation, behavioral problems and sleep disturbance. The patient is not nervous/anxious.        Memory loss    Immunization History  Administered Date(s) Administered  . DTaP 06/03/2016  . Influenza Inj Mdck Quad Pf 03/13/2016  . Influenza-Unspecified 03/09/2013, 03/01/2014, 03/08/2015  . PPD  Test 12/04/2011  . Pneumococcal Conjugate-13 09/12/2014  . Pneumococcal Polysaccharide-23 09/12/2015  . Td 06/03/2016   Pertinent  Health Maintenance Due  Topic Date Due  . INFLUENZA VACCINE  Completed  . PNA vac Low Risk Adult  Completed   Fall Risk  02/18/2016 09/12/2015 03/21/2015 03/13/2014 02/28/2013  Falls in the past year? No No No No Yes  Number falls in past yr: - - - - 1  Injury with Fall? - - - - Yes   Functional Status Survey:   Wt Readings from Last 3 Encounters:  08/18/16 169 lb 9.6 oz (76.9 kg)  07/10/16 171 lb (77.6 kg)  06/03/16 167 lb (75.8 kg)    Temp Readings from Last 3 Encounters:  08/18/16 97.5 F (36.4 C)  07/10/16 97.4 F (36.3 C)  06/03/16 97.4 F (36.3 C) (Oral)   BP Readings from Last 3 Encounters:  08/18/16 126/78  07/10/16 120/71  06/03/16 109/64   Pulse Readings from Last 3 Encounters:  08/18/16 61  07/10/16 62  06/03/16 67    Physical Exam  Constitutional: She is oriented to person, place, and time. She appears well-developed and  well-nourished. No distress.  HENT:  Head: Normocephalic and atraumatic.  Eyes: Conjunctivae are normal. Pupils are equal, round, and reactive to light.  Neck: No JVD present.  Cardiovascular: Normal rate and regular rhythm.  Exam reveals no gallop and no friction rub.   No murmur heard. No edema.  Pulmonary/Chest: Effort normal. No respiratory distress. She has no wheezes. She has no rales. She exhibits no tenderness.  Abdominal: Soft. Bowel sounds are normal. She exhibits no distension. There is no tenderness.  Musculoskeletal: She exhibits no edema or tenderness.  LUE 4/5, LLE 3/5.  RUE 5/5 RLE 5/5.  Crepitus noted to both knees. Decrease ROM to neck. Kyphosis noted.   Neurological: She is alert and oriented to person, place, and time. No cranial nerve deficit.  Alert to self, place and situation.   Skin: Skin is warm and dry. She is not diaphoretic.  Psychiatric: She has a normal mood and affect.  Nursing note and vitals reviewed.   Labs reviewed: Lab Results  Component Value Date   HGBA1C 6.0 (H) 10/14/2015     Recent Labs  10/13/15 0851 10/13/15 0914 04/14/16  NA 139 140 140  K 4.0 3.9 4.3  CL 102 100*  --   CO2 27  --   --   GLUCOSE 112* 107*  --   BUN 17 19 25*  CREATININE 0.82 0.80 0.6  CALCIUM 9.4  --   --     Recent Labs  09/28/15 10/13/15 0851 04/14/16  AST 14 16 17   ALT 13 18 17   ALKPHOS 59 60 89  BILITOT  --  0.6  --   PROT  --  6.8  --   ALBUMIN  --  3.7  --     Recent Labs  10/01/15 1959 10/13/15 0851 10/13/15 0914 04/14/16  WBC 6.1 6.1  --  6.5  NEUTROABS  --  4.7  --   --   HGB 11.8* 13.0 15.0 13.0  HCT 38 40.3 44.0 40  MCV  --  85.4  --   --   PLT 267 298  --  181   Lab Results  Component Value Date   TSH 3.16 09/20/2015   Lab Results  Component Value Date   HGBA1C 6.0 (H) 10/14/2015   Lab Results  Component Value Date   CHOL 181  10/14/2015   HDL 57 10/14/2015   LDLCALC 108 (H) 10/14/2015   TRIG 79 10/14/2015   CHOLHDL  3.2 10/14/2015    Significant Diagnostic Results in last 30 days:  No results found.  Assessment/Plan  1. Chronic midline low back pain without sciatica Would like to avoid nsaids Try voltaren gel 1% QID 4 grams to low back   2. Cerebrovascular disease h/o prior stroke LDL and BP at goal Continue plavix, followed by neuro q 6 mo  3. Essential hypertension Controlled continue norvasc 10 mg qd  4. Slow transit constipation Continue miralax 17 grams qod  Family/ staff Communication: discussed with resident  Labs/tests ordered: NA Due in May  Christy Emeril Stille, ANP [facility] [phone]

## 2016-08-25 ENCOUNTER — Other Ambulatory Visit: Payer: Self-pay | Admitting: *Deleted

## 2016-08-25 MED ORDER — HYDROCODONE-ACETAMINOPHEN 5-325 MG PO TABS: ORAL_TABLET | ORAL | 0 refills | Status: DC

## 2016-08-25 NOTE — Telephone Encounter (Signed)
[facility] #[phone] Fax: [phone]  

## 2016-08-27 ENCOUNTER — Encounter: Payer: Self-pay | Admitting: Family

## 2016-08-27 ENCOUNTER — Ambulatory Visit (INDEPENDENT_AMBULATORY_CARE_PROVIDER_SITE_OTHER)
Admission: RE | Admit: 2016-08-27 | Discharge: 2016-08-27 | Disposition: A | Discharge status: Home / Self Care | Payer: Medicare Other | Source: Ambulatory Visit | Attending: Vascular Surgery | Admitting: Vascular Surgery

## 2016-08-27 ENCOUNTER — Ambulatory Visit (INDEPENDENT_AMBULATORY_CARE_PROVIDER_SITE_OTHER): Payer: Medicare Other | Admitting: Family

## 2016-08-27 ENCOUNTER — Ambulatory Visit (HOSPITAL_COMMUNITY)
Admission: RE | Admit: 2016-08-27 | Discharge: 2016-08-27 | Disposition: A | Discharge status: Home / Self Care | Payer: Medicare Other | Source: Ambulatory Visit | Attending: Vascular Surgery | Admitting: Vascular Surgery

## 2016-08-27 VITALS — BP 120/72 | HR 60 | Temp 97.0°F | Resp 14 | Ht 65.0 in | Wt 169.0 lb

## 2016-08-27 DIAGNOSIS — I6521 Occlusion and stenosis of right carotid artery: Secondary | ICD-10-CM | POA: Diagnosis not present

## 2016-08-27 DIAGNOSIS — R9439 Abnormal result of other cardiovascular function study: Secondary | ICD-10-CM | POA: Insufficient documentation

## 2016-08-27 DIAGNOSIS — I6522 Occlusion and stenosis of left carotid artery: Secondary | ICD-10-CM

## 2016-08-27 DIAGNOSIS — Z9889 Other specified postprocedural states: Secondary | ICD-10-CM

## 2016-08-27 DIAGNOSIS — I739 Peripheral vascular disease, unspecified: Secondary | ICD-10-CM | POA: Insufficient documentation

## 2016-08-27 DIAGNOSIS — Z48812 Encounter for surgical aftercare following surgery on the circulatory system: Secondary | ICD-10-CM | POA: Diagnosis not present

## 2016-08-27 LAB — VAS US CAROTID
LCCADDIAS: 17 cm/s
LCCAPDIAS: 16 cm/s
LEFT ECA DIAS: -5 cm/s
LEFT VERTEBRAL DIAS: -2 cm/s
LICAPDIAS: 11 cm/s
LICAPSYS: 120 cm/s
Left CCA dist sys: 128 cm/s
Left CCA prox sys: 85 cm/s
Left ICA dist dias: -26 cm/s
Left ICA dist sys: -109 cm/s
RCCAPSYS: 112 cm/s
RIGHT CCA MID DIAS: -12 cm/s
RIGHT ECA DIAS: 13 cm/s
RIGHT VERTEBRAL DIAS: -17 cm/s
Right CCA prox dias: 12 cm/s
Right cca dist sys: -85 cm/s

## 2016-08-27 NOTE — Patient Instructions (Signed)
Stroke Prevention Some medical conditions and behaviors are associated with an increased chance of having a stroke. You may prevent a stroke by making healthy choices and managing medical conditions. How can I reduce my risk of having a stroke?  Stay physically active. Get at least 30 minutes of activity on most or all days.  Do not smoke. It may also be helpful to avoid exposure to secondhand smoke.  Limit alcohol use. Moderate alcohol use is considered to be:  No more than 2 drinks per day for men.  No more than 1 drink per day for nonpregnant women.  Eat healthy foods. This involves:  Eating 5 or more servings of fruits and vegetables a day.  Making dietary changes that address high blood pressure (hypertension), high cholesterol, diabetes, or obesity.  Manage your cholesterol levels.  Making food choices that are high in fiber and low in saturated fat, trans fat, and cholesterol may control cholesterol levels.  Take any prescribed medicines to control cholesterol as directed by your health care provider.  Manage your diabetes.  Controlling your carbohydrate and sugar intake is recommended to manage diabetes.  Take any prescribed medicines to control diabetes as directed by your health care provider.  Control your hypertension.  Making food choices that are low in salt (sodium), saturated fat, trans fat, and cholesterol is recommended to manage hypertension.  Ask your health care provider if you need treatment to lower your blood pressure. Take any prescribed medicines to control hypertension as directed by your health care provider.  If you are 18-39 years of age, have your blood pressure checked every 3-5 years. If you are 40 years of age or older, have your blood pressure checked every year.  Maintain a healthy weight.  Reducing calorie intake and making food choices that are low in sodium, saturated fat, trans fat, and cholesterol are recommended to manage  weight.  Stop drug abuse.  Avoid taking birth control pills.  Talk to your health care provider about the risks of taking birth control pills if you are over 35 years old, smoke, get migraines, or have ever had a blood clot.  Get evaluated for sleep disorders (sleep apnea).  Talk to your health care provider about getting a sleep evaluation if you snore a lot or have excessive sleepiness.  Take medicines only as directed by your health care provider.  For some people, aspirin or blood thinners (anticoagulants) are helpful in reducing the risk of forming abnormal blood clots that can lead to stroke. If you have the irregular heart rhythm of atrial fibrillation, you should be on a blood thinner unless there is a good reason you cannot take them.  Understand all your medicine instructions.  Make sure that other conditions (such as anemia or atherosclerosis) are addressed. Get help right away if:  You have sudden weakness or numbness of the face, arm, or leg, especially on one side of the body.  Your face or eyelid droops to one side.  You have sudden confusion.  You have trouble speaking (aphasia) or understanding.  You have sudden trouble seeing in one or both eyes.  You have sudden trouble walking.  You have dizziness.  You have a loss of balance or coordination.  You have a sudden, severe headache with no known cause.  You have new chest pain or an irregular heartbeat. Any of these symptoms may represent a serious problem that is an emergency. Do not wait to see if the symptoms will go away.   Get medical help at once. Call your local emergency services (911 in [place] Do not drive yourself to the hospital.  This information is not intended to replace advice given to you by your health care provider. Make sure you discuss any questions you have with your health care provider. Document Released: 06/19/2004 Document Revised: 10/18/2015 Document Reviewed: 11/12/2012 Elsevier  Interactive Patient Education  2017 Elsevier Inc.      Peripheral Vascular Disease Peripheral vascular disease (PVD) is a disease of the blood vessels that are not part of your heart and brain. A simple term for PVD is poor circulation. In most cases, PVD narrows the blood vessels that carry blood from your heart to the rest of your body. This can result in a decreased supply of blood to your arms, legs, and internal organs, like your stomach or kidneys. However, it most often affects a person's lower legs and feet. There are two types of PVD.  Organic PVD. This is the more common type. It is caused by damage to the structure of blood vessels.  Functional PVD. This is caused by conditions that make blood vessels contract and tighten (spasm). Without treatment, PVD tends to get worse over time. PVD can also lead to acute ischemic limb. This is when an arm or limb suddenly has trouble getting enough blood. This is a medical emergency. Follow these instructions at home:  Take medicines only as told by your doctor.  Do not use any tobacco products, including cigarettes, chewing tobacco, or electronic cigarettes. If you need help quitting, ask your doctor.  Lose weight if you are overweight, and maintain a healthy weight as told by your doctor.  Eat a diet that is low in fat and cholesterol. If you need help, ask your doctor.  Exercise regularly. Ask your doctor for some good activities for you.  Take good care of your feet.  Wear comfortable shoes that fit well.  Check your feet often for any cuts or sores. Contact a doctor if:  You have cramps in your legs while walking.  You have leg pain when you are at rest.  You have coldness in a leg or foot.  Your skin changes.  You are unable to get or have an erection (erectile dysfunction).  You have cuts or sores on your feet that are not healing. Get help right away if:  Your arm or leg turns cold and blue.  Your arms or legs  become red, warm, swollen, painful, or numb.  You have chest pain or trouble breathing.  You suddenly have weakness in your face, arm, or leg.  You become very confused or you cannot speak.  You suddenly have a very bad headache.  You suddenly cannot see. This information is not intended to replace advice given to you by your health care provider. Make sure you discuss any questions you have with your health care provider. Document Released: 08/06/2009 Document Revised: 10/18/2015 Document Reviewed: 10/20/2013 Elsevier Interactive Patient Education  2017 Elsevier Inc.  

## 2016-08-27 NOTE — Progress Notes (Signed)
[facility]  HISTORY AND PHYSICAL   MRN : [id number]  History of Present Illness:   Tara Walters is a 83 y.o. female patient of Dr. Edilia Bo who is s/p left CEA in 2009 and also is followed for moderate PAD. She returns for routine surveillance. She is a resident of [facility] facility.   Pt denies claudication in legs with walking, she does report neuropathy in her feet, worse with walking. She no longer walks, states this is due to severe neuropathy, pt does not know reason for neuropathy, no known back problems. She has tight feeling in her calves and feet at rest, denies non healing ulcers on legs/feet.  Pt states her blood pressure later in the day is better, higher in the morning and when she is upset.  She was admitted to [facility] ED in May 2017 with CVA. MRI of the brain from that visit shows punctate areas of acute nonhemorrhagic infarction in the posterior right frontal lobe as well as remote lacunar infarcts of the basal ganglia and brainstem.  Pt reports some mild residual left hand tingling. She denies any speech difficulties or vision changes with this CVA.   Pt Diabetic: No Pt smoker: former smoker, quit in the 1970's  Pt meds include: Statin :ye ASA: No Other anticoagulants/antiplatelets: Plavix    Current Outpatient Prescriptions  Medication Sig Dispense Refill  . acetaminophen (TYLENOL) 500 MG tablet Take 500 mg by mouth 2 (two) times daily.    Marland Kitchen alum & mag hydroxide-simeth (MINTOX) 200-200-20 MG/5ML suspension Take 15 mLs by mouth at bedtime. And prn indigestion    . amLODipine (NORVASC) 10 MG tablet Take 10 mg by mouth daily. for blood pressure    . atorvastatin (LIPITOR) 20 MG tablet Take 1 tablet (20 mg total) by mouth daily at 6 PM. 30 tablet 2  . clopidogrel (PLAVIX) 75 MG tablet Take 75 mg by mouth daily.     . diclofenac sodium (VOLTAREN) 1 % GEL     . eszopiclone (LUNESTA) 1 MG TABS tablet Take 1 mg by mouth at  bedtime. Take immediately before bedtime    . HYDROcodone-acetaminophen (NORCO/VICODIN) 5-325 MG tablet Take one tablet by mouth at 10pm (control) for pain. DNE 3gms of APAP/24hours 30 tablet 0  . Menthol, Topical Analgesic, (BIOFREEZE EX) Apply 1 application topically as needed. To neck and knees and twice daily as needed to any affected joint.     . mirabegron ER (MYRBETRIQ) 50 MG TB24 tablet Take 50 mg by mouth at bedtime.     Marland Kitchen nystatin-triamcinolone (MYCOLOG II) cream     . pantoprazole (PROTONIX) 40 MG tablet Take 40 mg by mouth daily.    Bertram Gala Glycol-Propyl Glycol (SYSTANE) 0.4-0.3 % GEL ophthalmic gel Place 1 application into both eyes 4 (four) times daily.    . polyethylene glycol powder (GLYCOLAX/MIRALAX) powder Take 17 g by mouth every other day. Mix in 8 oz liquid and drink    . senna-docusate (SENNA S) 8.6-50 MG tablet Take 2 tablets by mouth at bedtime.    . SUPER B COMPLEX/C PO Take 2 tablets by mouth daily.     No current facility-administered medications for this visit.     Past Medical History:  Diagnosis Date  . Abnormality of gait   . Allergic rhinitis   . Anxiety   . Arthritis   . Carotid stenosis, right    asymptomatic  <40%  right  ica  and right eca  .  Carpal tunnel syndrome on both sides   . Claudication, intermittent (HCC)   . COPD (chronic obstructive pulmonary disease) (HCC)   . Diverticulosis of colon   . Fracture, calcaneus closed   . History of esophagitis   . History of hypothyroidism   . Hypertension   . Internal hemorrhoids   . Irritable bowel syndrome 03/11/2010  . PAD (peripheral artery disease) (HCC) monitored by dr Rubye Oaks   moderate  . Peripheral vascular disease (HCC)   . Stroke (HCC)   . Unspecified hereditary and idiopathic peripheral neuropathy   . Urge urinary incontinence     Social History Social History  Substance Use Topics  . Smoking status: Former Smoker    Packs/day: 1.00    Years: 50.00    Types: Cigarettes     Quit date: 05/27/1983  . Smokeless tobacco: Never Used  . Alcohol use No    Family History Family History  Problem Relation Age of Onset  . Heart attack Mother   . Cancer Father     Leukemia  . Diabetes Father     Surgical History Past Surgical History:  Procedure Laterality Date  . CAROTID ENDARTERECTOMY Left 11-18-2007  . CATARACT EXTRACTION W/ INTRAOCULAR LENS IMPLANT Right 11/2012  . COLONOSCOPY  2005   mild diverticulosis  . CYSTOSCOPY WITH INJECTION N/A 09/06/2013   Procedure: CYSTOSCOPY WITH BOTOX  INJECTION;  Surgeon: Martina Sinner, MD;  Location: [facility] [place];  Service: Urology;  Laterality: N/A;  . DILATION AND CURETTAGE OF UTERUS  1973    Allergies  Allergen Reactions  . Tramadol Nausea Only    Current Outpatient Prescriptions  Medication Sig Dispense Refill  . acetaminophen (TYLENOL) 500 MG tablet Take 500 mg by mouth 2 (two) times daily.    Marland Kitchen alum & mag hydroxide-simeth (MINTOX) 200-200-20 MG/5ML suspension Take 15 mLs by mouth at bedtime. And prn indigestion    . amLODipine (NORVASC) 10 MG tablet Take 10 mg by mouth daily. for blood pressure    . atorvastatin (LIPITOR) 20 MG tablet Take 1 tablet (20 mg total) by mouth daily at 6 PM. 30 tablet 2  . clopidogrel (PLAVIX) 75 MG tablet Take 75 mg by mouth daily.     . diclofenac sodium (VOLTAREN) 1 % GEL     . eszopiclone (LUNESTA) 1 MG TABS tablet Take 1 mg by mouth at bedtime. Take immediately before bedtime    . HYDROcodone-acetaminophen (NORCO/VICODIN) 5-325 MG tablet Take one tablet by mouth at 10pm (control) for pain. DNE 3gms of APAP/24hours 30 tablet 0  . Menthol, Topical Analgesic, (BIOFREEZE EX) Apply 1 application topically as needed. To neck and knees and twice daily as needed to any affected joint.     . mirabegron ER (MYRBETRIQ) 50 MG TB24 tablet Take 50 mg by mouth at bedtime.     Marland Kitchen nystatin-triamcinolone (MYCOLOG II) cream     . pantoprazole (PROTONIX) 40 MG tablet Take 40 mg by  mouth daily.    Bertram Gala Glycol-Propyl Glycol (SYSTANE) 0.4-0.3 % GEL ophthalmic gel Place 1 application into both eyes 4 (four) times daily.    . polyethylene glycol powder (GLYCOLAX/MIRALAX) powder Take 17 g by mouth every other day. Mix in 8 oz liquid and drink    . senna-docusate (SENNA S) 8.6-50 MG tablet Take 2 tablets by mouth at bedtime.    . SUPER B COMPLEX/C PO Take 2 tablets by mouth daily.     No current facility-administered medications for this visit.  REVIEW OF SYSTEMS: See HPI for pertinent positives and negatives.  Physical Examination Vitals:   08/27/16 1050  BP: 120/72  Pulse: 60  Resp: 14  Temp: 97 F (36.1 C)  TempSrc: Oral  SpO2: 98%  Weight: 169 lb (76.7 kg)  Height:  (1.651 m)   Body mass index is 28.12 kg/m.  General:  WDWN in NAD Gait: seated in wheelchair HENT: WNL Eyes: Pupils are equal Pulmonary: normal non-labored breathing, no rales, rhonchi, or wheezing Cardiac: RRR, no detected murmur  Abdomen: soft, NT, no palpable masses Skin: no rashes, no ulcers, no cellulitis.   VASCULAR EXAM  Carotid Bruits Left Right   negative negative   radial pulses are 2+ palpable and = Aorta is not palpable   VASCULAR EXAM: Extremities without ischemic changes  without Gangrene; without open wounds.     LE Pulses LEFT RIGHT   FEMORAL  not palpable seated in w/c  not palpable seated in w/c    POPLITEAL not palpable  not palpable   POSTERIOR TIBIAL not palpable  not palpable    DORSALIS PEDIS  ANTERIOR TIBIAL not palpable  not palpable     Musculoskeletal: no muscle wasting or atrophy; no edema. Wearing knee high graduated compression hose. Neurologic: A&O X 3; Appropriate Affect;  SENSATION:  normal; MOTOR FUNCTION: 5/5 Symmetric in upper extremities, 4/5 in lower extremities, CN 2-12 intact, Speech is fluent/normal      ASSESSMENT:  KRYSTALE RINKENBERGER is a 83 y.o. female who is s/p left CEA in 2009 and also is followed for moderate PAD.  Pt states she no longer walks other than a few steps,  this is due to severe neuropathy, pt does not know reason for neuropathy, no known back problems. She has no signs of of ischemia in her feet/legs, pedal pulses are no palpable.   She was admitted to [facility] ED in May 2017 with an ischemic CVA. MRI of the brain from that visit shows punctate areas of acute nonhemorrhagic infarction in the posterior right frontal lobe as well as remote lacunar infarcts of the basal ganglia and brainstem. She denies any subsequent stroke or TIA.  Pt reports some mild residual left hand tingling.  DATA  Today's carotid duplex suggests 1-39% stenosis of the right ICA and no restenosis of the left ICA (CEA site). No significant stenosis of the left ECA or bilateral CCA. >50% stenosis of the right ECA. Right vertebral artery flow is antegrade, left is antegrade/resistive. Bilateral subclavian artery waveforms are normal.  No significant change compared to exams of 08/09/14 and 08-15-15.   ABI: Right: 0.69 (0.61, 08-15-15); waveforms: PT: biphasic; DP: monophasic; TBI: 0.49 Left: 0.63 (0.69, 08-15-15); waveforms: biphasic; TBI: 0.43 ABI's remain stable with moderate arterial occlusive disease bilaterally, bi and monophasic waveforms.    PLAN:   Daily seated active leg exercises. Based on today's exam and non-invasive vascular lab results, the patient will follow up in 1 year with the following tests: carotid duplex and ABI's. I advised pt and her attendant to return sooner for rest pain or signs of ischemia in feet/legs.   I discussed in depth with the patient the nature of atherosclerosis, and emphasized the importance of maximal medical management  including strict control of blood pressure, blood glucose, and lipid levels, obtaining regular exercise, and cessation of smoking.  The patient is aware that without maximal medical management the underlying atherosclerotic disease process will progress, limiting the benefit of any interventions.  The patient was given  information about stroke prevention and what symptoms should prompt the patient to seek immediate medical care.  The patient was given information about PAD including signs, symptoms, treatment, what symptoms should prompt the patient to seek immediate medical care, and risk reduction measures to take. Thank you for allowing [place] to participate in this patient's care.  Charisse March, RN, MSN, FNP-C [facility] Office: [phone]  Clinic MD: Early 08/27/2016 11:13 AM

## 2016-08-28 NOTE — Addendum Note (Signed)
Addended by: Burton Apley A on: 08/28/2016 09:52 AM   Modules accepted: Orders

## 2016-09-01 ENCOUNTER — Other Ambulatory Visit: Payer: Self-pay | Admitting: Internal Medicine

## 2016-09-01 ENCOUNTER — Encounter: Payer: Self-pay | Admitting: Adult Health

## 2016-09-01 ENCOUNTER — Non-Acute Institutional Stay (SKILLED_NURSING_FACILITY): Payer: Medicare Other | Admitting: Adult Health

## 2016-09-01 DIAGNOSIS — R5383 Other fatigue: Secondary | ICD-10-CM | POA: Diagnosis not present

## 2016-09-01 DIAGNOSIS — Z79899 Other long term (current) drug therapy: Secondary | ICD-10-CM | POA: Diagnosis not present

## 2016-09-01 DIAGNOSIS — M6281 Muscle weakness (generalized): Secondary | ICD-10-CM | POA: Diagnosis not present

## 2016-09-01 DIAGNOSIS — R112 Nausea with vomiting, unspecified: Secondary | ICD-10-CM

## 2016-09-01 DIAGNOSIS — E860 Dehydration: Secondary | ICD-10-CM

## 2016-09-01 DIAGNOSIS — D649 Anemia, unspecified: Secondary | ICD-10-CM | POA: Diagnosis not present

## 2016-09-01 DIAGNOSIS — N390 Urinary tract infection, site not specified: Secondary | ICD-10-CM | POA: Diagnosis not present

## 2016-09-01 DIAGNOSIS — R319 Hematuria, unspecified: Secondary | ICD-10-CM | POA: Diagnosis not present

## 2016-09-01 NOTE — Addendum Note (Signed)
Addended by: Esmond Camper on: 09/01/2016 04:49 PM   Modules accepted: Level of Service

## 2016-09-01 NOTE — Progress Notes (Signed)
Location:  Medical illustrator of Service:  SNF (31) Provider:   Peggye Ley, ANP [facility] [phone]   REED, Elmarie Shiley, DO  Patient Care Team: [name], DO as PCP - General (Geriatric Medicine) [facility] Fletcher Anon, NP as Nurse Practitioner (Nurse Practitioner) Marvel Plan, MD as Consulting Physician (Neurology)  Extended Emergency Contact Information Primary Emergency Contact: [name] [place] [address] [address] [place] of [place] Home Phone: [phone] Mobile Phone: [phone] Relation: Daughter Secondary Emergency Contact: Reed,Tilden  [place] of [place] Home Phone: [phone] Mobile Phone: [phone] Relation: Relative  Code Status:  DNR Goals of care: Advanced Directive information Advanced Directives 08/27/2016  Does Patient Have a Medical Advance Directive? Yes  Type of Estate agent of [place];Living will  Does patient want to make changes to medical advance directive? -  Copy of Healthcare Power of Attorney in Chart? -  Pre-existing out of facility DNR order (yellow form or pink MOST form) -     Chief Complaint  Patient presents with  . Acute Visit    lethargy, vomit x 1    HPI:  Pt is a 83 y.o. female seen today for an acute visit for vomiting x 1, loose stool x1, and lethargy.  These symptoms started on 4/8 on second shift.  She has not had any cough, nasal symptoms, cp, shortness of breath etc. She denies abd pain but reports intermittent nausea. No further diarrhea. She is more confused than usual and slight sleepy but still able to follow commands. She has some underlying dementia but is mostly A/O at baseline.  No fever, sats 93% were 89-90% earlier.  BP 133/74.  Has a dry mouth and doesn't have her dentures in, which makes it difficult to talk with providers. NO focal deficits reported.   Rectal temp 98.9, no fecal impaction noted per  nurse  Past Medical History:  Diagnosis Date  . Abnormality of gait   . Allergic rhinitis   . Anxiety   . Arthritis   . Carotid stenosis, right    asymptomatic  <40%  right  ica  and right eca  . Carpal tunnel syndrome on both sides   . Claudication, intermittent (HCC)   . COPD (chronic obstructive pulmonary disease) (HCC)   . Diverticulosis of colon   . Fracture, calcaneus closed   . History of esophagitis   . History of hypothyroidism   . Hypertension   . Internal hemorrhoids   . Irritable bowel syndrome 03/11/2010  . PAD (peripheral artery disease) (HCC) monitored by dr Rubye Oaks   moderate  . Peripheral vascular disease (HCC)   . Stroke (HCC)   . Unspecified hereditary and idiopathic peripheral neuropathy   . Urge urinary incontinence    Past Surgical History:  Procedure Laterality Date  . CAROTID ENDARTERECTOMY Left 11-18-2007  . CATARACT EXTRACTION W/ INTRAOCULAR LENS IMPLANT Right 11/2012  . COLONOSCOPY  2005   mild diverticulosis  . CYSTOSCOPY WITH INJECTION N/A 09/06/2013   Procedure: CYSTOSCOPY WITH BOTOX  INJECTION;  Surgeon: Martina Sinner, MD;  Location: [facility] [place];  Service: Urology;  Laterality: N/A;  . DILATION AND CURETTAGE OF UTERUS  1973    Allergies  Allergen Reactions  . Tramadol Nausea Only    Allergies as of 09/01/2016      Reactions   Tramadol Nausea Only      Medication List  Accurate as of 09/01/16  1:15 PM. Always use your most recent med list.          acetaminophen 500 MG tablet Commonly known as:  TYLENOL Take 500 mg by mouth 2 (two) times daily.   amLODipine 10 MG tablet Commonly known as:  NORVASC Take 10 mg by mouth daily. for blood pressure   atorvastatin 20 MG tablet Commonly known as:  LIPITOR Take 1 tablet (20 mg total) by mouth daily at 6 PM.   BIOFREEZE EX Apply 1 application topically as needed. To neck and knees and twice daily as needed to any affected joint.   clopidogrel 75 MG  tablet Commonly known as:  PLAVIX Take 75 mg by mouth daily.   diclofenac sodium 1 % Gel Commonly known as:  VOLTAREN   eszopiclone 1 MG Tabs tablet Commonly known as:  LUNESTA Take 1 mg by mouth at bedtime. Take immediately before bedtime   HYDROcodone-acetaminophen 5-325 MG tablet Commonly known as:  NORCO/VICODIN Take one tablet by mouth at 10pm (control) for pain. DNE 3gms of APAP/24hours   MINTOX 200-200-20 MG/5ML suspension Generic drug:  alum & mag hydroxide-simeth Take 15 mLs by mouth at bedtime. And prn indigestion   MYRBETRIQ 50 MG Tb24 tablet Generic drug:  mirabegron ER Take 50 mg by mouth at bedtime.   nystatin-triamcinolone cream Commonly known as:  MYCOLOG II   pantoprazole 40 MG tablet Commonly known as:  PROTONIX Take 40 mg by mouth daily.   polyethylene glycol powder powder Commonly known as:  GLYCOLAX/MIRALAX Take 17 g by mouth every other day. Mix in 8 oz liquid and drink   SENNA S 8.6-50 MG tablet Generic drug:  senna-docusate Take 2 tablets by mouth at bedtime.   SUPER B COMPLEX/C PO Take 2 tablets by mouth daily.   SYSTANE 0.4-0.3 % Gel ophthalmic gel Generic drug:  Polyethyl Glycol-Propyl Glycol Place 1 application into both eyes 4 (four) times daily.       Review of Systems  Constitutional: Positive for activity change, appetite change and fatigue. Negative for chills, diaphoresis, fever and unexpected weight change.  HENT: Negative for congestion, rhinorrhea, sore throat and trouble swallowing.   Eyes: Negative for visual disturbance.  Respiratory: Negative for cough, choking, chest tightness, shortness of breath, wheezing and stridor.   Cardiovascular: Negative for chest pain, palpitations and leg swelling.  Gastrointestinal: Positive for diarrhea, nausea and vomiting. Negative for abdominal distention, abdominal pain, blood in stool, constipation and rectal pain.  Genitourinary: Negative for difficulty urinating, dysuria, flank pain,  frequency, hematuria and pelvic pain.  Musculoskeletal: Positive for arthralgias, back pain and gait problem.       Baseline chronic pain to neck, low back, and knees  Skin: Negative for rash and wound.  Neurological: Negative for dizziness, tremors, seizures, syncope, facial asymmetry, speech difficulty, weakness, light-headedness, numbness and headaches.  Psychiatric/Behavioral: Positive for confusion. Negative for agitation, behavioral problems, dysphoric mood, hallucinations and sleep disturbance.    Immunization History  Administered Date(s) Administered  . DTaP 06/03/2016  . Influenza Inj Mdck Quad Pf 03/13/2016  . Influenza-Unspecified 03/09/2013, 03/01/2014, 03/08/2015  . PPD Test 12/04/2011  . Pneumococcal Conjugate-13 09/12/2014  . Pneumococcal Polysaccharide-23 09/12/2015  . Td 06/03/2016   Pertinent  Health Maintenance Due  Topic Date Due  . INFLUENZA VACCINE  12/24/2016  . PNA vac Low Risk Adult  Completed   Fall Risk  02/18/2016 09/12/2015 03/21/2015 03/13/2014 02/28/2013  Falls in the past year? No No No No Yes  Number falls in past yr: - - - - 1  Injury with Fall? - - - - Yes   Functional Status Survey:    Vitals:   09/01/16 1314  BP: 133/74  Pulse: 88  Resp: (!) 22  Temp: 98.9 F (37.2 C)  SpO2: 93%   There is no height or weight on file to calculate BMI. Physical Exam  Constitutional: No distress.  HENT:  Head: Normocephalic and atraumatic.  Right Ear: External ear normal.  Left Ear: External ear normal.  Nose: Nose normal.  Mouth/Throat: Oropharynx is clear and moist. No oropharyngeal exudate.  Very dry mouth  Eyes: Conjunctivae are normal. Pupils are equal, round, and reactive to light. Right eye exhibits no discharge. Left eye exhibits no discharge.  Neck: No JVD present.  Cardiovascular: Normal rate and regular rhythm.   No murmur heard. bilat lower ext leg venous stasis changes  Pulmonary/Chest: Effort normal and breath sounds normal. No  respiratory distress. She has no wheezes.  Abdominal: Soft. Bowel sounds are normal. She exhibits no distension and no mass. There is no tenderness.  Musculoskeletal: She exhibits no edema or tenderness.  Neurological: She is alert. No cranial nerve deficit.  Oriented to self and place, not time or situation. Able to f/c  Skin: Skin is warm and dry. She is not diaphoretic.  Psychiatric: She has a normal mood and affect.  Nursing note and vitals reviewed.   Labs reviewed:  Recent Labs  10/13/15 0851 10/13/15 0914 04/14/16  NA 139 140 140  K 4.0 3.9 4.3  CL 102 100*  --   CO2 27  --   --   GLUCOSE 112* 107*  --   BUN 17 19 25*  CREATININE 0.82 0.80 0.6  CALCIUM 9.4  --   --     Recent Labs  09/28/15 10/13/15 0851 04/14/16  AST ALT ALKPHOS 59 60 89  BILITOT  --  0.6  --   PROT  --  6.8  --   ALBUMIN  --  3.7  --     Recent Labs  10/01/15 1959 10/13/15 0851 10/13/15 0914 04/14/16  WBC 6.1 6.1  --  6.5  NEUTROABS  --  4.7  --   --   HGB 11.8* 13.0 15.0 13.0  HCT 38 40.3 44.0 40  MCV  --  85.4  --   --   PLT 267 298  --  181   Lab Results  Component Value Date   TSH 3.16 09/20/2015   Lab Results  Component Value Date   HGBA1C 6.0 (H) 10/14/2015   Lab Results  Component Value Date   CHOL 181 10/14/2015   HDL 57 10/14/2015   LDLCALC 108 (H) 10/14/2015   TRIG 79 10/14/2015   CHOLHDL 3.2 10/14/2015    Significant Diagnostic Results in last 30 days:  No results found.  Assessment/Plan  1. Dehydration Appears very dry on exam which is most likely why she is lethargic Will try NS at 125 cc/hr x 1L then reduce to NS or cap off depending on labs ? If this is due to GI illness which appears to be resolving as she has not any further diarrhea/vomiting Stable VS, able to follow commands. Continue to monitor  2. Lethargy Due to #1 Will check CXR to rule out pna as well as labs and UA C and S  3. Non-intractable vomiting with nausea,  unspecified vomiting type Resolving Clear  liquid diet and adv as tolerated  Family/ staff Communication: discussed with staff   Labs/tests ordered:  CBC BMP CXR UA C and S

## 2016-09-10 ENCOUNTER — Non-Acute Institutional Stay (SKILLED_NURSING_FACILITY): Payer: Medicare Other

## 2016-09-10 DIAGNOSIS — Z0000 Encounter for general adult medical examination without abnormal findings: Secondary | ICD-10-CM

## 2016-09-10 NOTE — Progress Notes (Signed)
   I reviewed health advisor's note, was available for consultation and agree with the assessment and plan as written.  Pt has refused her bone density historically and is no longer ambulating with walker--uses wheelchair.  Will arrange mass shingrix vaccination process.  Richrd Kuzniar L. Cassidey Barrales, D.O. Geriatrics [facility] [address], [place] [address] Cell Phone (Mon-Fri 8am-5pm):  [phone] On Call:  [phone] & follow prompts after 5pm & weekends Office Phone:  [phone] Office Fax:  [phone]   Quick Notes   Health Maintenance: Shingles vaccine due, dexa due     Abnormal Screen: MMSE 18/30. Did not pass clock     Patient Concerns: None     Nurse Concerns: None

## 2016-09-10 NOTE — Progress Notes (Signed)
Subjective:   Tara Walters is a 83 y.o. female who presents for Medicare Annual (Subsequent) preventive examination at [facility].  Cardiac Risk Factors include: hypertension;advanced age (>71men, >22 women);family history of premature cardiovascular disease;sedentary lifestyle;smoking/ tobacco exposure     Objective:     Vitals: BP 120/78 (BP Location: Left Arm, Patient Position: Sitting)   Pulse 65   Temp 98.9 F (37.2 C) (Oral)   Ht  (1.651 m)   Wt 169 lb (76.7 kg)   SpO2 96%   BMI 28.12 kg/m   Body mass index is 28.12 kg/m.   Tobacco History  Smoking Status  . Former Smoker  . Packs/day: 1.00  . Years: 40.00  . Types: Cigarettes  . Quit date: 05/27/1983  Smokeless Tobacco  . Never Used     Counseling given: Not Answered   Past Medical History:  Diagnosis Date  . Abnormality of gait   . Allergic rhinitis   . Anxiety   . Arthritis   . Carotid stenosis, right    asymptomatic  <40%  right  ica  and right eca  . Carpal tunnel syndrome on both sides   . Claudication, intermittent (HCC)   . COPD (chronic obstructive pulmonary disease) (HCC)   . Diverticulosis of colon   . Fracture, calcaneus closed   . History of esophagitis   . History of hypothyroidism   . Hypertension   . Internal hemorrhoids   . Irritable bowel syndrome 03/11/2010  . PAD (peripheral artery disease) (HCC) monitored by dr Rubye Oaks   moderate  . Peripheral vascular disease (HCC)   . Stroke (HCC)   . Unspecified hereditary and idiopathic peripheral neuropathy   . Urge urinary incontinence    Past Surgical History:  Procedure Laterality Date  . CAROTID ENDARTERECTOMY Left 11-18-2007  . CATARACT EXTRACTION W/ INTRAOCULAR LENS IMPLANT Right 11/2012  . COLONOSCOPY  2005   mild diverticulosis  . CYSTOSCOPY WITH INJECTION N/A 09/06/2013   Procedure: CYSTOSCOPY WITH BOTOX  INJECTION;  Surgeon: Martina Sinner, MD;  Location: [facility];  Service: Urology;   Laterality: N/A;  . DILATION AND CURETTAGE OF UTERUS  1973   Family History  Problem Relation Age of Onset  . Heart attack Mother   . Cancer Father     Leukemia  . Diabetes Father    History  Sexual Activity  . Sexual activity: No    Outpatient Encounter Prescriptions as of 09/10/2016  Medication Sig  . acetaminophen (TYLENOL) 500 MG tablet Take 500 mg by mouth 2 (two) times daily.  Marland Kitchen alum & mag hydroxide-simeth (MINTOX) 200-200-20 MG/5ML suspension Take 15 mLs by mouth at bedtime. And prn indigestion  . amLODipine (NORVASC) 10 MG tablet Take 10 mg by mouth daily. for blood pressure  . atorvastatin (LIPITOR) 20 MG tablet Take 1 tablet (20 mg total) by mouth daily at 6 PM.  . clopidogrel (PLAVIX) 75 MG tablet Take 75 mg by mouth daily.   . diclofenac sodium (VOLTAREN) 1 % GEL   . eszopiclone (LUNESTA) 1 MG TABS tablet Take 1 mg by mouth at bedtime. Take immediately before bedtime  . HYDROcodone-acetaminophen (NORCO/VICODIN) 5-325 MG tablet Take one tablet by mouth at 10pm (control) for pain. DNE 3gms of APAP/24hours  . Menthol, Topical Analgesic, (BIOFREEZE EX) Apply 1 application topically as needed. To neck and knees and twice daily as needed to any affected joint.   . mirabegron ER (MYRBETRIQ) 50 MG TB24 tablet Take 50 mg by  mouth at bedtime.   . pantoprazole (PROTONIX) 40 MG tablet Take 40 mg by mouth daily.  Bertram Gala Glycol-Propyl Glycol (SYSTANE) 0.4-0.3 % GEL ophthalmic gel Place 1 application into both eyes 4 (four) times daily.  . polyethylene glycol powder (GLYCOLAX/MIRALAX) powder Take 17 g by mouth every other day. Mix in 8 oz liquid and drink  . senna-docusate (SENNA S) 8.6-50 MG tablet Take 2 tablets by mouth at bedtime.  . SUPER B COMPLEX/C PO Take 2 tablets by mouth daily.  . [DISCONTINUED] nystatin-triamcinolone (MYCOLOG II) cream    No facility-administered encounter medications on file as of 09/10/2016.     Activities of Daily Living In your present state of  health, do you have any difficulty performing the following activities: 09/10/2016 11/10/2015  Hearing? N Y  Vision? N Y  Difficulty concentrating or making decisions? N Y  Walking or climbing stairs? Y Y  Dressing or bathing? Y Y  Doing errands, shopping? Malvin Johns  Preparing Food and eating ? Y Y  Using the Toilet? Y Y  In the past six months, have you accidently leaked urine? Y Y  Do you have problems with loss of bowel control? N N  Managing your Medications? Y Y  Managing your Finances? Malvin Johns  Housekeeping or managing your Housekeeping? Malvin Johns  Some recent data might be hidden    Patient Care Team: [name], DO as PCP - General (Geriatric Medicine) [facility] Fletcher Anon, NP as Nurse Practitioner (Nurse Practitioner) Marvel Plan, MD as Consulting Physician (Neurology)    Assessment:    Exercise Activities and Dietary recommendations Current Exercise Habits: The patient does not participate in regular exercise at present, Exercise limited by: orthopedic condition(s)  Goals    . Increase water intake          Starting 09/10/2016 I will try to increase my water intake to 2 styrofoam cups a day.      Fall Risk Fall Risk  09/10/2016 02/18/2016 09/12/2015 03/21/2015 03/13/2014  Falls in the past year? Yes No No No No  Number falls in past yr: 1 - - - -  Injury with Fall? No - - - -  Risk for fall due to : History of fall(s) - - - -  Follow up Education provided - - - -   Depression Screen PHQ 2/9 Scores 09/10/2016 02/18/2016 09/12/2015 03/21/2015  PHQ - 2 Score 0 0 0 0     Cognitive Function MMSE - Mini Mental State Exam 09/10/2016  Orientation to time 4  Orientation to Place 5  Registration 3  Attention/ Calculation 0  Recall 0  Language- name 2 objects 2  Language- repeat 1  Language- follow 3 step command 1  Language- read & follow direction 1  Write a sentence 1  Copy design 0  Total score 18        Immunization History  Administered  Date(s) Administered  . DTaP 06/03/2016  . Influenza Inj Mdck Quad Pf 03/13/2016  . Influenza-Unspecified 03/09/2013, 03/01/2014, 03/08/2015  . PPD Test 12/04/2011  . Pneumococcal Conjugate-13 09/12/2014  . Pneumococcal Polysaccharide-23 09/12/2015  . Td 06/03/2016   Screening Tests Health Maintenance  Topic Date Due  . INFLUENZA VACCINE  12/24/2016  . TETANUS/TDAP  06/03/2026  . PNA vac Low Risk Adult  Completed      Plan:  I have personally reviewed and addressed the Medicare Annual Wellness questionnaire and have noted the following in the patient's chart:  A. Medical and social history B. Use of alcohol, tobacco or illicit drugs  C. Current medications and supplements D. Functional ability and status E.  Nutritional status F.  Physical activity G. Advance directives H. List of other physicians I.  Hospitalizations, surgeries, and ER visits in previous 12 months J.  Vitals K. Screenings to include hearing, vision, cognitive, depression L. Referrals and appointments - none  In addition, I have reviewed and discussed with patient certain preventive protocols, quality metrics, and best practice recommendations. A written personalized care plan for preventive services as well as general preventive health recommendations were provided to patient.  See attached scanned questionnaire for additional information.   Signed,   Annetta Maw, RN Nurse Health Advisor

## 2016-09-10 NOTE — Patient Instructions (Signed)
[name] , Thank you for taking time to come for your Medicare Wellness Visit. I appreciate your ongoing commitment to your health goals. Please review the following plan we discussed and let me know if I can assist you in the future.   Screening recommendations/referrals: Colonoscopy up to date Mammogram up to date Bone Density due Recommended yearly ophthalmology/optometry visit for glaucoma screening and checkup Recommended yearly dental visit for hygiene and checkup  Vaccinations: Influenza vaccine up to date Pneumococcal vaccine up to date Tdap vaccine due 06/03/2026 Shingles vaccine due  Advanced directives: In Chart  Conditions/risks identified: None  Next appointment: none   Preventive Care 65 Years and Older, Female Preventive care refers to lifestyle choices and visits with your health care provider that can promote health and wellness. What does preventive care include?  A yearly physical exam. This is also called an annual well check.  Dental exams once or twice a year.  Routine eye exams. Ask your health care provider how often you should have your eyes checked.  Personal lifestyle choices, including:  Daily care of your teeth and gums.  Regular physical activity.  Eating a healthy diet.  Avoiding tobacco and drug use.  Limiting alcohol use.  Practicing safe sex.  Taking low-dose aspirin every day.  Taking vitamin and mineral supplements as recommended by your health care provider. What happens during an annual well check? The services and screenings done by your health care provider during your annual well check will depend on your age, overall health, lifestyle risk factors, and family history of disease. Counseling  Your health care provider may ask you questions about your:  Alcohol use.  Tobacco use.  Drug use.  Emotional well-being.  Home and relationship well-being.  Sexual activity.  Eating habits.  History of falls.  Memory  and ability to understand (cognition).  Work and work Astronomer.  Reproductive health. Screening  You may have the following tests or measurements:  Height, weight, and BMI.  Blood pressure.  Lipid and cholesterol levels. These may be checked every 5 years, or more frequently if you are over 83 years old.  Skin check.  Lung cancer screening. You may have this screening every year starting at age 5 if you have a 30-pack-year history of smoking and currently smoke or have quit within the past 15 years.  Fecal occult blood test (FOBT) of the stool. You may have this test every year starting at age 18.  Flexible sigmoidoscopy or colonoscopy. You may have a sigmoidoscopy every 5 years or a colonoscopy every 10 years starting at age 78.  Hepatitis C blood test.  Hepatitis B blood test.  Sexually transmitted disease (STD) testing.  Diabetes screening. This is done by checking your blood sugar (glucose) after you have not eaten for a while (fasting). You may have this done every 1-3 years.  Bone density scan. This is done to screen for osteoporosis. You may have this done starting at age 34.  Mammogram. This may be done every 1-2 years. Talk to your health care provider about how often you should have regular mammograms. Talk with your health care provider about your test results, treatment options, and if necessary, the need for more tests. Vaccines  Your health care provider may recommend certain vaccines, such as:  Influenza vaccine. This is recommended every year.  Tetanus, diphtheria, and acellular pertussis (Tdap, Td) vaccine. You may need a Td booster every 10 years.  Zoster vaccine. You may need this after age  60.  Pneumococcal 13-valent conjugate (PCV13) vaccine. One dose is recommended after age 53.  Pneumococcal polysaccharide (PPSV23) vaccine. One dose is recommended after age 92. Talk to your health care provider about which screenings and vaccines you need and  how often you need them. This information is not intended to replace advice given to you by your health care provider. Make sure you discuss any questions you have with your health care provider. Document Released: 06/08/2015 Document Revised: 01/30/2016 Document Reviewed: 03/13/2015 Elsevier Interactive Patient Education  2017 [place] Prevention in the Home Falls can cause injuries. They can happen to people of all ages. There are many things you can do to make your home safe and to help prevent falls. What can I do on the outside of my home?  Regularly fix the edges of walkways and driveways and fix any cracks.  Remove anything that might make you trip as you walk through a door, such as a raised step or threshold.  Trim any bushes or trees on the path to your home.  Use bright outdoor lighting.  Clear any walking paths of anything that might make someone trip, such as rocks or tools.  Regularly check to see if handrails are loose or broken. Make sure that both sides of any steps have handrails.  Any raised decks and porches should have guardrails on the edges.  Have any leaves, snow, or ice cleared regularly.  Use sand or salt on walking paths during winter.  Clean up any spills in your garage right away. This includes oil or grease spills. What can I do in the bathroom?  Use night lights.  Install grab bars by the toilet and in the tub and shower. Do not use towel bars as grab bars.  Use non-skid mats or decals in the tub or shower.  If you need to sit down in the shower, use a plastic, non-slip stool.  Keep the floor dry. Clean up any water that spills on the floor as soon as it happens.  Remove soap buildup in the tub or shower regularly.  Attach bath mats securely with double-sided non-slip rug tape.  Do not have throw rugs and other things on the floor that can make you trip. What can I do in the bedroom?  Use night lights.  Make sure that you  have a light by your bed that is easy to reach.  Do not use any sheets or blankets that are too big for your bed. They should not hang down onto the floor.  Have a firm chair that has side arms. You can use this for support while you get dressed.  Do not have throw rugs and other things on the floor that can make you trip. What can I do in the kitchen?  Clean up any spills right away.  Avoid walking on wet floors.  Keep items that you use a lot in easy-to-reach places.  If you need to reach something above you, use a strong step stool that has a grab bar.  Keep electrical cords out of the way.  Do not use floor polish or wax that makes floors slippery. If you must use wax, use non-skid floor wax.  Do not have throw rugs and other things on the floor that can make you trip. What can I do with my stairs?  Do not leave any items on the stairs.  Make sure that there are handrails on both sides of the stairs and  use them. Fix handrails that are broken or loose. Make sure that handrails are as long as the stairways.  Check any carpeting to make sure that it is firmly attached to the stairs. Fix any carpet that is loose or worn.  Avoid having throw rugs at the top or bottom of the stairs. If you do have throw rugs, attach them to the floor with carpet tape.  Make sure that you have a light switch at the top of the stairs and the bottom of the stairs. If you do not have them, ask someone to add them for you. What else can I do to help prevent falls?  Wear shoes that:  Do not have high heels.  Have rubber bottoms.  Are comfortable and fit you well.  Are closed at the toe. Do not wear sandals.  If you use a stepladder:  Make sure that it is fully opened. Do not climb a closed stepladder.  Make sure that both sides of the stepladder are locked into place.  Ask someone to hold it for you, if possible.  Clearly mark and make sure that you can see:  Any grab bars or  handrails.  First and last steps.  Where the edge of each step is.  Use tools that help you move around (mobility aids) if they are needed. These include:  Canes.  Walkers.  Scooters.  Crutches.  Turn on the lights when you go into a dark area. Replace any light bulbs as soon as they burn out.  Set up your furniture so you have a clear path. Avoid moving your furniture around.  If any of your floors are uneven, fix them.  If there are any pets around you, be aware of where they are.  Review your medicines with your doctor. Some medicines can make you feel dizzy. This can increase your chance of falling. Ask your doctor what other things that you can do to help prevent falls. This information is not intended to replace advice given to you by your health care provider. Make sure you discuss any questions you have with your health care provider. Document Released: 03/08/2009 Document Revised: 10/18/2015 Document Reviewed: 06/16/2014 Elsevier Interactive Patient Education  2017 [employer].

## 2016-09-22 ENCOUNTER — Non-Acute Institutional Stay (SKILLED_NURSING_FACILITY): Payer: Medicare Other | Admitting: Adult Health

## 2016-09-22 ENCOUNTER — Encounter: Payer: Self-pay | Admitting: Adult Health

## 2016-09-22 DIAGNOSIS — K5903 Drug induced constipation: Secondary | ICD-10-CM | POA: Diagnosis not present

## 2016-09-22 DIAGNOSIS — G8929 Other chronic pain: Secondary | ICD-10-CM

## 2016-09-22 DIAGNOSIS — M545 Low back pain: Secondary | ICD-10-CM | POA: Diagnosis not present

## 2016-09-22 DIAGNOSIS — M533 Sacrococcygeal disorders, not elsewhere classified: Secondary | ICD-10-CM | POA: Diagnosis not present

## 2016-09-22 NOTE — Progress Notes (Signed)
Location:  Medical illustrator of Service:  SNF (31) Provider:   Peggye Ley, ANP [facility] [phone]   REED, Elmarie Shiley, DO  Patient Care Team: [name], DO as PCP - General (Geriatric Medicine) [facility] Fletcher Anon, NP as Nurse Practitioner (Nurse Practitioner) Marvel Plan, MD as Consulting Physician (Neurology)  Extended Emergency Contact Information Primary Emergency Contact: [name] [place] [address] [address] Home Phone: [phone] Mobile Phone: [phone] Relation: Daughter Secondary Emergency Contact: Reed,Tilden  [place] Home Phone: [phone] Mobile Phone: [phone] Relation: Relative  Code Status:  DNR Goals of care: Advanced Directive information Advanced Directives 09/10/2016  Does Patient Have a Medical Advance Directive? Yes  Type of Advance Directive Living will;Healthcare Power of Attorney  Does patient want to make changes to medical advance directive? No - Patient declined  Copy of Healthcare Power of Attorney in Chart? Yes  Pre-existing out of facility DNR order (yellow form or pink MOST form) -     Chief Complaint  Patient presents with  . Acute Visit    low back pain, constipation    HPI:  Pt is a 83 y.o. female seen today for an acute visit for chronic low back pain and constipation.  She has a hx of various arthritis pains including DDD to the cervical area. She also has chronic low back pain. She has not had any additional testing due to her age and lack of desire to pursue surgical options. She reports the low back pain is worse over the past week and she is not sleeping well. She takes 1 norco at bedtime and has been waking up in the middle of night and needing another dose. We have tried voltaren gel and biofreeze without benefit. She is not a good candidate for NSAIDS.  I do not have a recent update xray of the low back  which is the area in question. There is no radiation of her pain per resident. She is no longer ambulatory and therefore does not receive DEXA scans. Staff report she has difficulty with constipation. She was miralax QD but this was reduce to QOD due to diarrhea. She is also on senna s 2 qhs.  She denies abd pain and urinary symptoms.  Past Medical History:  Diagnosis Date  . Abnormality of gait   . Allergic rhinitis   . Anxiety   . Arthritis   . Carotid stenosis, right    asymptomatic  <40%  right  ica  and right eca  . Carpal tunnel syndrome on both sides   . Claudication, intermittent (HCC)   . COPD (chronic obstructive pulmonary disease) (HCC)   . Diverticulosis of colon   . Fracture, calcaneus closed   . History of esophagitis   . History of hypothyroidism   . Hypertension   . Internal hemorrhoids   . Irritable bowel syndrome 03/11/2010  . PAD (peripheral artery disease) (HCC) monitored by dr Rubye Oaks   moderate  . Peripheral vascular disease (HCC)   . Stroke (HCC)   . Unspecified hereditary and idiopathic peripheral neuropathy   . Urge urinary incontinence    Past Surgical History:  Procedure Laterality Date  . CAROTID ENDARTERECTOMY Left 11-18-2007  . CATARACT EXTRACTION W/ INTRAOCULAR LENS IMPLANT Right 11/2012  . COLONOSCOPY  2005   mild diverticulosis  . CYSTOSCOPY WITH INJECTION N/A 09/06/2013   Procedure: CYSTOSCOPY WITH BOTOX  INJECTION;  Surgeon: Martina Sinner, MD;  Location: [facility];  Service: Urology;  Laterality: N/A;  . DILATION AND CURETTAGE OF UTERUS  1973    Allergies  Allergen Reactions  . Tramadol Nausea Only    Outpatient Encounter Prescriptions as of 09/22/2016  Medication Sig  . acetaminophen (TYLENOL) 500 MG tablet Take 500 mg by mouth 2 (two) times daily.  Marland Kitchen alum & mag hydroxide-simeth (MINTOX) 200-200-20 MG/5ML suspension Take 15 mLs by mouth at bedtime. And prn indigestion  . amLODipine (NORVASC) 10 MG tablet Take 10  mg by mouth daily. for blood pressure  . atorvastatin (LIPITOR) 20 MG tablet Take 1 tablet (20 mg total) by mouth daily at 6 PM.  . clopidogrel (PLAVIX) 75 MG tablet Take 75 mg by mouth daily.   . diclofenac sodium (VOLTAREN) 1 % GEL   . eszopiclone (LUNESTA) 1 MG TABS tablet Take 1 mg by mouth at bedtime. Take immediately before bedtime  . HYDROcodone-acetaminophen (NORCO/VICODIN) 5-325 MG tablet Take one tablet by mouth at 10pm (control) for pain. DNE 3gms of APAP/24hours  . Menthol, Topical Analgesic, (BIOFREEZE EX) Apply 1 application topically as needed. To neck and knees and twice daily as needed to any affected joint.   . mirabegron ER (MYRBETRIQ) 50 MG TB24 tablet Take 50 mg by mouth at bedtime.   . pantoprazole (PROTONIX) 40 MG tablet Take 40 mg by mouth daily.  Bertram Gala Glycol-Propyl Glycol (SYSTANE) 0.4-0.3 % GEL ophthalmic gel Place 1 application into both eyes 4 (four) times daily.  . polyethylene glycol powder (GLYCOLAX/MIRALAX) powder Take 17 g by mouth every other day. Mix in 8 oz liquid and drink  . senna-docusate (SENNA S) 8.6-50 MG tablet Take 2 tablets by mouth at bedtime.  . SUPER B COMPLEX/C PO Take 2 tablets by mouth daily.   No facility-administered encounter medications on file as of 09/22/2016.     Review of Systems  Constitutional: Negative for activity change, appetite change, chills, diaphoresis, fatigue, fever and unexpected weight change.  HENT: Negative for congestion.   Respiratory: Negative for cough, shortness of breath and wheezing.   Cardiovascular: Positive for leg swelling. Negative for chest pain and palpitations.  Gastrointestinal: Positive for constipation. Negative for abdominal distention, abdominal pain, diarrhea, nausea, rectal pain and vomiting.  Genitourinary: Negative for difficulty urinating and dysuria.  Musculoskeletal: Positive for arthralgias, back pain and gait problem. Negative for joint swelling and myalgias.  Neurological: Positive  for weakness (prior CVA with left leg weakness). Negative for dizziness, tremors, seizures, syncope, facial asymmetry, speech difficulty, light-headedness, numbness and headaches.  Psychiatric/Behavioral: Negative for agitation, behavioral problems and confusion.    Immunization History  Administered Date(s) Administered  . DTaP 06/03/2016  . Influenza Inj Mdck Quad Pf 03/13/2016  . Influenza-Unspecified 03/09/2013, 03/01/2014, 03/08/2015  . PPD Test 12/04/2011  . Pneumococcal Conjugate-13 09/12/2014  . Pneumococcal Polysaccharide-23 09/12/2015  . Td 06/03/2016   Pertinent  Health Maintenance Due  Topic Date Due  . INFLUENZA VACCINE  12/24/2016  . PNA vac Low Risk Adult  Completed   Fall Risk  09/10/2016 02/18/2016 09/12/2015 03/21/2015 03/13/2014  Falls in the past year? Yes No No No No  Number falls in past yr: 1 - - - -  Injury with Fall? No - - - -  Risk for fall due to : History of fall(s) - - - -  Follow up Education provided - - - -   Functional Status Survey:    There were no vitals filed  for this visit. There is no height or weight on file to calculate BMI. Physical Exam  Constitutional: She is oriented to person, place, and time. No distress.  HENT:  Head: Normocephalic and atraumatic.  Cardiovascular: Normal rate and regular rhythm.   No murmur heard. Pulmonary/Chest: Effort normal and breath sounds normal. No respiratory distress.  Abdominal: Soft. Bowel sounds are normal. She exhibits no distension. There is no tenderness.  Musculoskeletal: She exhibits tenderness (lumbar spine). She exhibits no edema.  Decreased strength to the LLE 3/5. Neg SLR  Neurological: She is alert and oriented to person, place, and time.  Skin: Skin is warm and dry. She is not diaphoretic.  Nursing note and vitals reviewed.   Labs reviewed:  Recent Labs  10/13/15 0851 10/13/15 0914 04/14/16  NA 139 140 140  K 4.0 3.9 4.3  CL 102 100*  --   CO2 27  --   --   GLUCOSE 112* 107*   --   BUN 17 19 25*  CREATININE 0.82 0.80 0.6  CALCIUM 9.4  --   --     Recent Labs  09/28/15 10/13/15 0851 04/14/16  AST ALT ALKPHOS 59 60 89  BILITOT  --  0.6  --   PROT  --  6.8  --   ALBUMIN  --  3.7  --     Recent Labs  10/01/15 1959 10/13/15 0851 10/13/15 0914 04/14/16  WBC 6.1 6.1  --  6.5  NEUTROABS  --  4.7  --   --   HGB 11.8* 13.0 15.0 13.0  HCT 38 40.3 44.0 40  MCV  --  85.4  --   --   PLT 267 298  --  181   Lab Results  Component Value Date   TSH 3.16 09/20/2015   Lab Results  Component Value Date   HGBA1C 6.0 (H) 10/14/2015   Lab Results  Component Value Date   CHOL 181 10/14/2015   HDL 57 10/14/2015   LDLCALC 108 (H) 10/14/2015   TRIG 79 10/14/2015   CHOLHDL 3.2 10/14/2015    Significant Diagnostic Results in last 30 days:  No results found.  Assessment/Plan  1. Drug-induced constipation Increase Senna s to 2 tabs BID, if no improvement change Miralax to 17 gram qd rather than QOD  2. Chronic midline low back pain without sciatica Worse per pt, has spine tenderness ?compression fx  Check lumbar/sacral xray  Change Norco 5/325 to 7.5/325 1 tab at bedtime and q 6 hrs prn  Family/ staff Communication: discussed with resident  Labs/tests ordered:  Lumbosacral xray

## 2016-10-14 ENCOUNTER — Encounter: Payer: Self-pay | Admitting: Internal Medicine

## 2016-10-14 ENCOUNTER — Non-Acute Institutional Stay (SKILLED_NURSING_FACILITY): Payer: Medicare Other | Admitting: Internal Medicine

## 2016-10-14 DIAGNOSIS — G8929 Other chronic pain: Secondary | ICD-10-CM

## 2016-10-14 DIAGNOSIS — Z23 Encounter for immunization: Secondary | ICD-10-CM | POA: Diagnosis not present

## 2016-10-14 DIAGNOSIS — I10 Essential (primary) hypertension: Secondary | ICD-10-CM | POA: Diagnosis not present

## 2016-10-14 DIAGNOSIS — K5901 Slow transit constipation: Secondary | ICD-10-CM

## 2016-10-14 DIAGNOSIS — I739 Peripheral vascular disease, unspecified: Secondary | ICD-10-CM

## 2016-10-14 DIAGNOSIS — F0280 Dementia in other diseases classified elsewhere without behavioral disturbance: Secondary | ICD-10-CM

## 2016-10-14 DIAGNOSIS — F0150 Vascular dementia without behavioral disturbance: Secondary | ICD-10-CM | POA: Diagnosis not present

## 2016-10-14 DIAGNOSIS — G309 Alzheimer's disease, unspecified: Secondary | ICD-10-CM | POA: Diagnosis not present

## 2016-10-14 DIAGNOSIS — M545 Low back pain, unspecified: Secondary | ICD-10-CM

## 2016-10-14 DIAGNOSIS — E039 Hypothyroidism, unspecified: Secondary | ICD-10-CM

## 2016-10-14 NOTE — Progress Notes (Signed)
Patient ID: Tara Walters, female   DOB: 05-Jan-1922, 83 y.o.   MRN: [id number]  Location:  [facility] Room Number: 120 Place of Service:  SNF [phone]) Provider:   Kermit Baloeed, Guilherme Schwenke L, DO  Patient Care Team: Kermit Baloeed, Alvan Culpepper L, DO as PCP - General (Geriatric Medicine) [facility], [facility], NP as Nurse Practitioner (Nurse Practitioner) Marvel PlanXu, Jindong, MD as Consulting Physician (Neurology)  Extended Emergency Contact Information Primary Emergency Contact: Tara Walters,Tara          [place] [id number] [place] Home Phone: [phone] Mobile Phone: [phone] Relation: Daughter Secondary Emergency Contact: Tara Walters,Tara  [place] Home Phone: [phone] Mobile Phone: [phone] Relation: Relative  Code Status:  DNR Goals of care: Advanced Directive information Advanced Directives 10/14/2016  Does Patient Have a Medical Advance Directive? Yes  Type of Advance Directive Out of facility DNR (pink MOST or yellow form);Healthcare Power of Attorney  Does patient want to make changes to medical advance directive? -  Copy of Healthcare Power of Attorney in Chart? Yes  Pre-existing out of facility DNR order (yellow form or pink MOST form) Yellow form placed in chart (order not valid for inpatient use)   Chief Complaint  Patient presents with  . Medical Management of Chronic Issues    Routine Visit    HPI:  Pt is a 83 y.o. female seen today for medical management of chronic diseases.    At her wellness, shingrix was recommended and bone density study.  Pt no longer ambulating so bone density medications will not be very effective and performing test will be challenging.  She scored only 18/30 now on MMSE which is a significant decline for her and she failed her clock drawing.    Seen Tara 30th by NP due to drug-induced constipation for which senna s was increased to 2 tabs bid and plans to increase miralax to  qd from qod if persistent.  Her low back pain was also addressed for which a lumbosacral spine xray series was performed showing no acute fx, but osteopenia diffusely and osteophytes and norco was increased to 7.5/325mg  po qhs and q6h prn for pain mgt. voltaren gel was previously tried in march but I think cost was an issue.  She was on 5/325mg  norco instead.  She also is on scheduled tylenol.    Earlier in Tara, she had dehydration, lethargy related to nausea, loose stool and vomiting.  She'd been more confused.  She was given IVFs x 1 liter and cxr, UA c+s obtained.  Diet was restricted and gradually advanced as tolerated and she recovered.  She was treated with levaquin for her "URI".    She also saw vascular surgery and was advised to do seated active leg exercises.  She had left CEA in 2009 and has moderate PAD.    In feb, she was gaining weight and smaller portions were recommended and she has lost 2 lbs since.  When seen today, she reported improvement in constipation.  Back still bothers her at times and is helped most by conservative measures with heat or ice.    Hypothyroidism:   Lab Results  Component Value Date   TSH 3.16 09/20/2015  Needs f/u labs.  Past Medical History:  Diagnosis Date  . Abnormality of gait   . Allergic rhinitis   . Anxiety   . Arthritis   . Carotid stenosis, right    asymptomatic  <40%  right  ica  and  right eca  . Carpal tunnel syndrome on both sides   . Claudication, intermittent (HCC)   . COPD (chronic obstructive pulmonary disease) (HCC)   . Diverticulosis of colon   . Fracture, calcaneus closed   . History of esophagitis   . History of hypothyroidism   . Hypertension   . Internal hemorrhoids   . Irritable bowel syndrome 03/11/2010  . PAD (peripheral artery disease) (HCC) monitored by dr Rubye Oaks   moderate  . Peripheral vascular disease (HCC)   . Stroke (HCC)   . Unspecified hereditary and idiopathic peripheral neuropathy   . Urge urinary  incontinence    Past Surgical History:  Procedure Laterality Date  . CAROTID ENDARTERECTOMY Left 11-18-2007  . CATARACT EXTRACTION W/ INTRAOCULAR LENS IMPLANT Right 11/2012  . COLONOSCOPY  2005   mild diverticulosis  . CYSTOSCOPY WITH INJECTION N/A 09/06/2013   Procedure: CYSTOSCOPY WITH BOTOX  INJECTION;  Surgeon: Martina Sinner, MD;  Location: [facility];  Service: Urology;  Laterality: N/A;  . DILATION AND CURETTAGE OF UTERUS  1973    Allergies  Allergen Reactions  . Tramadol Nausea Only    Outpatient Encounter Prescriptions as of 10/14/2016  Medication Sig  . acetaminophen (TYLENOL) 500 MG tablet Take 500 mg by mouth 2 (two) times daily.  Marland Kitchen alum & mag hydroxide-simeth (MINTOX) 200-200-20 MG/5ML suspension Take 15 mLs by mouth at bedtime. And prn indigestion  . amLODipine (NORVASC) 10 MG tablet Take 10 mg by mouth daily. for blood pressure  . atorvastatin (LIPITOR) 20 MG tablet Take 1 tablet (20 mg total) by mouth daily at 6 PM.  . clopidogrel (PLAVIX) 75 MG tablet Take 75 mg by mouth daily.   . eszopiclone (LUNESTA) 1 MG TABS tablet Take 1 mg by mouth at bedtime. Take immediately before bedtime  . HYDROcodone-acetaminophen (NORCO/VICODIN) 5-325 MG tablet Take one tablet by mouth at 10pm (control) for pain. DNE 3gms of APAP/24hours  . Menthol, Topical Analgesic, (BIOFREEZE EX) Apply 1 application topically as needed. To neck and knees and twice daily as needed to any affected joint.   . mirabegron ER (MYRBETRIQ) 50 MG TB24 tablet Take 50 mg by mouth at bedtime.   . pantoprazole (PROTONIX) 40 MG tablet Take 40 mg by mouth daily.  Bertram Gala Glycol-Propyl Glycol (SYSTANE) 0.4-0.3 % GEL ophthalmic gel Place 1 application into both eyes 4 (four) times daily.  . polyethylene glycol powder (GLYCOLAX/MIRALAX) powder Take 17 g by mouth every other day. Mix in 8 oz liquid and drink  . senna-docusate (SENNA S) 8.6-50 MG tablet Take 2 tablets by mouth 2 (two) times daily as  needed for mild constipation.   . SUPER B COMPLEX/C PO Take 2 tablets by mouth daily.  . [DISCONTINUED] diclofenac sodium (VOLTAREN) 1 % GEL    No facility-administered encounter medications on file as of 10/14/2016.     Review of Systems  Constitutional: Positive for activity change. Negative for appetite change, fatigue and unexpected weight change.       No longer ambulates  HENT: Negative for congestion.   Respiratory: Positive for shortness of breath. Negative for chest tightness.        Deconditioned  Cardiovascular: Negative for chest pain, palpitations and leg swelling.  Gastrointestinal: Positive for constipation. Negative for abdominal pain, diarrhea, nausea and vomiting.  Genitourinary: Negative for dysuria.       Urinary incontinence  Musculoskeletal: Positive for arthralgias, back pain and gait problem.  Neurological: Positive for weakness. Negative for dizziness.  Hematological: Bruises/bleeds easily.  Psychiatric/Behavioral: Positive for confusion. Negative for behavioral problems and sleep disturbance. The patient is not nervous/anxious.     Immunization History  Administered Date(s) Administered  . DTaP 06/03/2016  . Influenza Inj Mdck Quad Pf 03/13/2016  . Influenza-Unspecified 03/09/2013, 03/01/2014, 03/08/2015  . PPD Test 12/04/2011  . Pneumococcal Conjugate-13 09/12/2014  . Pneumococcal Polysaccharide-23 09/12/2015  . Td 06/03/2016   Pertinent  Health Maintenance Due  Topic Date Due  . INFLUENZA VACCINE  12/24/2016  . PNA vac Low Risk Adult  Completed   Fall Risk  09/10/2016 02/18/2016 09/12/2015 03/21/2015 03/13/2014  Falls in the past year? Yes No No No No  Number falls in past yr: 1 - - - -  Injury with Fall? No - - - -  Risk for fall due to : History of fall(s) - - - -  Follow up Education provided - - - -   Functional Status Survey:    Vitals:   10/14/16 1059  BP: 137/67  Pulse: 72  Resp: 16  Temp: 97.7 F (36.5 C)  TempSrc: Oral  SpO2:  93%   There is no height or weight on file to calculate BMI. Physical Exam  Constitutional: She is oriented to person, place, and time. She appears well-developed and well-nourished. No distress.  Cardiovascular: Normal rate, regular rhythm, normal heart sounds and intact distal pulses.   Pulmonary/Chest: Effort normal and breath sounds normal. No respiratory distress.  Abdominal: Soft. Bowel sounds are normal. She exhibits no distension. There is no tenderness.  Musculoskeletal: Normal range of motion.  No tenderness of back or knees  Neurological: She is alert and oriented to person, place, and time.  Short term memory loss  Skin: Skin is warm and dry. No erythema.  Psychiatric: She has a normal mood and affect.    Labs reviewed:  Recent Labs  04/14/16  NA 140  K 4.3  BUN 25*  CREATININE 0.6    Recent Labs  04/14/16  AST 17  ALT 17  ALKPHOS 89    Recent Labs  04/14/16  WBC 6.5  HGB 13.0  HCT 40  PLT 181   Lab Results  Component Value Date   TSH 3.16 09/20/2015   Lab Results  Component Value Date   HGBA1C 6.0 (H) 10/14/2015   Lab Results  Component Value Date   CHOL 181 10/14/2015   HDL 57 10/14/2015   LDLCALC 108 (H) 10/14/2015   TRIG 79 10/14/2015   CHOLHDL 3.2 10/14/2015     Assessment/Plan 1. Chronic midline low back pain without sciatica -cont current norco and conservative measures with rest, heat, ice as needed -positional changes more often should also help  2. Mixed Alzheimer's and vascular dementia -progressing gradually, had a stroke along the say that considerably affected her cognition and functional status soon before her move to SNF  3. Essential hypertension -bp at goal with current therapy, cont same and monitor -if bp>150 systolic, should be rechecked manually and documented as such  4. Slow transit constipation -cont current bowel regimen which has helped  5. Need for shingles vaccine -will order shingrix series for her    6. PVD (peripheral vascular disease) with claudication (HCC) -cont f/u dopplers per vascular/cards and bp mgt with amlodipine, lipid mgt with lipitor plus plavix  7. Hypothyroidism, unspecified type -historically, but last TSH last year was normal, f/u tsh  Family/ staff Communication: discussed with snf nurse  Labs/tests ordered:  Shingrix, tsh  Haja Crego L. Renato Gails,  D.O. [facility] [phone], [place] [address] Cell Phone (Mon-Fri 8am-5pm):  [phone] On Call:  [phone] & follow prompts after 5pm & weekends Office Phone:  [phone] Office Fax:  [phone]

## 2016-10-21 ENCOUNTER — Ambulatory Visit (INDEPENDENT_AMBULATORY_CARE_PROVIDER_SITE_OTHER): Payer: Medicare Other | Admitting: Neurology

## 2016-10-21 ENCOUNTER — Ambulatory Visit: Payer: Medicare Other | Admitting: Neurology

## 2016-10-21 ENCOUNTER — Encounter: Payer: Self-pay | Admitting: Neurology

## 2016-10-21 VITALS — BP 134/60 | HR 60

## 2016-10-21 DIAGNOSIS — I63311 Cerebral infarction due to thrombosis of right middle cerebral artery: Secondary | ICD-10-CM | POA: Diagnosis not present

## 2016-10-21 DIAGNOSIS — I6522 Occlusion and stenosis of left carotid artery: Secondary | ICD-10-CM | POA: Diagnosis not present

## 2016-10-21 DIAGNOSIS — I739 Peripheral vascular disease, unspecified: Secondary | ICD-10-CM

## 2016-10-21 DIAGNOSIS — I679 Cerebrovascular disease, unspecified: Secondary | ICD-10-CM | POA: Diagnosis not present

## 2016-10-21 DIAGNOSIS — Z9889 Other specified postprocedural states: Secondary | ICD-10-CM

## 2016-10-21 NOTE — Progress Notes (Signed)
STROKE NEUROLOGY FOLLOW UP NOTE  NAME: Tara Walters DOB: 07-27-1921  REASON FOR VISIT: stroke follow up HISTORY FROM: pt and chart  Today we had the pleasure of seeing Tara Walters in follow-up at our Neurology Clinic. Pt was accompanied by nursing staff from [facility].   History Summary Tara Walters is a 83 y.o. female with history of hypertension, gait abnormality, COPD, PVD, carpal tunnel syndrome, and previous left CEA was admitted on 10/13/15 for left hand numbness and drooping left eye. MRI found to have right two CR/SO punctate infarcts and remote lacunar infarcts b/l BG and brainstem. MRA showed diffuse high grade stenosis in multiple large vessels including right M1/M2, left P1, left PICA, right ICA siphon and right P2. CUS 07/2015 unremarkable, and TTE in 06/2015 EF 65%. LDL 108 and A1C 6.0. She was put on DAPT and lipitor 20 and discharged back to [facility].   Follow up 01/15/16 - the patient has been doing well. Symptoms all resolved. She has old BLE weakness, L>R and not able to ambulate at baseline before the stroke which has not been changed. BP 132/63. Still on DAPT and no side effect. Otherwise, no complains.   Follow up 04/21/16 - pt stated that she is doing well and no complains. She lives in [facility] and mostly wheelchair bound which is her baseline. She did not have PT/OT after discharge and she declined further PT/OT at this time. She is on plavix and lipitor. Bp today 134/61.  Interval History During the interval time, pt has been doing well. At her baseline without complains. Lives in [facility], largely wheelchair bound but able to walk a little bit in her room. BP today 134/60. On plavix and lipitor without side effects.    REVIEW OF SYSTEMS: Full 14 system review of systems performed and notable only for those listed below and in HPI above, all others are negative:  Constitutional:   Cardiovascular:  Ear/Nose/Throat:   Skin:  Eyes:   Respiratory:    Gastroitestinal:   Genitourinary:  Hematology/Lymphatic:   Endocrine:  Musculoskeletal:  Walking difficulty Allergy/Immunology:   Neurological:   Psychiatric:  Sleep:   The following represents the patient's updated allergies and side effects list: Allergies  Allergen Reactions  . Tramadol Nausea Only    The neurologically relevant items on the patient's problem list were reviewed on today's visit.  Neurologic Examination  A problem focused neurological exam (12 or more points of the single system neurologic examination, vital signs counts as 1 point, cranial nerves count for 8 points) was performed.  Blood pressure 134/60, pulse 60.  General - Well nourished, well developed, in no apparent distress.  Ophthalmologic - Fundi not visualized due to noncooperation.  Cardiovascular - Regular rate and rhythm.  Mental Status -  Level of arousal and orientation to place, and person were intact, but not orientated to time. Language including expression, naming, repetition, comprehension was assessed and found intact. Fund of Knowledge was assessed and was impaired.  Cranial Nerves II - XII - II - Visual field intact OU. III, IV, VI - Extraocular movements intact. V - Facial sensation intact bilaterally. VII - Facial movement intact bilaterally. VIII - Hearing & vestibular intact bilaterally. X - Palate elevates symmetrically. XI - Chin turning & shoulder shrug intact bilaterally. XII - Tongue protrusion intact.  Motor Strength - The patient's strength was 5/5 BUEs and 4/5 right LE proximal (chronic) and 4/5 left LE proximal, BLE distally 5/5 and pronator drift was  absent.  Bulk was normal and fasciculations were absent.   Motor Tone - Muscle tone was assessed at the neck and appendages and was normal.  Reflexes - The patient's reflexes were 1+ in all extremities and she had no pathological reflexes.  Sensory - Light touch, temperature/pinprick were assessed and were  symmetrical.    Coordination - The patient had normal movements in the hands with no ataxia or dysmetria.  Tremor was absent.  Gait and Station - in wheelchair, not tested.   Data reviewed: I personally reviewed the images and agree with the radiology interpretations.  Dg Chest 2 View 10/13/2015   No active cardiopulmonary disease.   Mr Maxine Glenn Head Wo Contrast 10/13/2015   1. High-grade stenosis of the distal right M1 segment or proximal posterior right M2 segment.  2. Marked attenuation of the anterior superior right M2 branch.  3. Mild narrowing of the proximal right M1 segment, less than 50%.  4. The left MCA bifurcation is intact.  5. Atherosclerotic changes within a hypoplastic left vertebral artery which terminates at the left PICA with a severe stenosis at the origin of the left PICA.  6. 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  7. High-grade stenosis of the proximal left posterior cerebral artery.   Mr Brain Wo Contrast 10/13/2015   1. Two punctate acute nonhemorrhagic infarcts within the posterior right frontal lobe along the precentral gyrus. These may be related to acute motor symptoms.  2. Advanced atrophy and diffuse white matter disease compatible with chronic microvascular ischemia.  3. Remote lacunar infarcts of the basal ganglia and brainstem.   CUS 07/2015 - left ICA patent s/p CEA, right ICA 1-39% stenosis  TTE 06/2015 - unremarkable EF 65%  Component     Latest Ref Rng & Units 09/20/2015 10/14/2015  Cholesterol     0 - 200 mg/dL  161  Triglycerides     <150 mg/dL  79  HDL Cholesterol     >40 mg/dL  57  Total CHOL/HDL Ratio     RATIO  3.2  VLDL     0 - 40 mg/dL  16  LDL (calc)     0 - 99 mg/dL  096 (H)  Hemoglobin E4V     4.8 - 5.6 %  6.0 (H)  Mean Plasma Glucose     mg/dL  409  TSH     8.11 - 9.14 uIU/mL 3.16     Assessment: As you may recall, she is a 83 y.o. Caucasian female with PMH of hypertension, baseline gait abnormality in  wheelchair, COPD, PVD, carpal tunnel syndrome, and previous left CEA was admitted on 10/13/15 for right two CR/SO punctate infarcts and remote lacunar infarcts b/l BG and brainstem. MRA showed diffuse high grade stenosis in multiple large vessels including right M1/M2, left P1, left PICA, right ICA siphon and right P2. CUS 07/2015 unremarkable, and TTE in 06/2015 EF 65%. LDL 108 and A1C 6.0. She was put on DAPT and lipitor 20 and discharged back to [facility]. During the interval time, the patient has been doing well. Symptoms all resolved, still at baseline with wheelchair. Finished course of DAPT and on plavix alone. BP stable and no complains.   Plan:  - continue plavix and lipitor for stroke prevention - encourage self exercise at facility as able and avoid fall.  - check BP at facility every day and record - Follow up with your primary care physician for stroke risk factor modification. Recommend maintain blood pressure goal <130/80, diabetes  with hemoglobin A1c goal below 7.0% and lipids with LDL cholesterol goal below 70 mg/dL.  - follow up as needed.  No orders of the defined types were placed in this encounter.   No orders of the defined types were placed in this encounter.   Patient Instructions  - continue plavix and lipitor for stroke prevention - encourage self exercise at facility as able and avoid fall.  - check BP at facility every day and record - Follow up with your primary care physician for stroke risk factor modification. Recommend maintain blood pressure goal <130/80, diabetes with hemoglobin A1c goal below 7.0% and lipids with LDL cholesterol goal below 70 mg/dL.  - follow up as needed.   Marvel Plan, MD PhD [facility] [address], [address] [place], [place] [address] [phone]

## 2016-10-21 NOTE — Patient Instructions (Addendum)
-   continue plavix and lipitor for stroke prevention - encourage self exercise at facility as able and avoid fall.  - check BP at facility every day and record - Follow up with your primary care physician for stroke risk factor modification. Recommend maintain blood pressure goal <130/80, diabetes with hemoglobin A1c goal below 7.0% and lipids with LDL cholesterol goal below 70 mg/dL.  - follow up as needed.

## 2016-10-27 ENCOUNTER — Other Ambulatory Visit: Payer: Self-pay | Admitting: *Deleted

## 2016-10-27 MED ORDER — ATORVASTATIN CALCIUM 20 MG PO TABS: 20.0000 mg | ORAL_TABLET | Freq: Every day | ORAL | 5 refills | Status: DC

## 2016-10-27 NOTE — Telephone Encounter (Signed)
[facility] 

## 2016-11-03 ENCOUNTER — Other Ambulatory Visit: Payer: Self-pay | Admitting: *Deleted

## 2016-11-03 MED ORDER — HYDROCODONE-ACETAMINOPHEN 7.5-325 MG PO TABS: 1.0000 | ORAL_TABLET | Freq: Four times a day (QID) | ORAL | 0 refills | Status: DC | PRN

## 2016-11-03 NOTE — Telephone Encounter (Signed)
[facility] #[phone] Fax: [phone]  

## 2016-11-14 ENCOUNTER — Non-Acute Institutional Stay (SKILLED_NURSING_FACILITY): Payer: Medicare Other | Admitting: Adult Health

## 2016-11-14 DIAGNOSIS — R278 Other lack of coordination: Secondary | ICD-10-CM | POA: Diagnosis not present

## 2016-11-14 DIAGNOSIS — I10 Essential (primary) hypertension: Secondary | ICD-10-CM | POA: Diagnosis not present

## 2016-11-14 DIAGNOSIS — K219 Gastro-esophageal reflux disease without esophagitis: Secondary | ICD-10-CM

## 2016-11-14 DIAGNOSIS — M170 Bilateral primary osteoarthritis of knee: Secondary | ICD-10-CM | POA: Diagnosis not present

## 2016-11-14 DIAGNOSIS — F0280 Dementia in other diseases classified elsewhere without behavioral disturbance: Secondary | ICD-10-CM | POA: Diagnosis not present

## 2016-11-14 DIAGNOSIS — G309 Alzheimer's disease, unspecified: Secondary | ICD-10-CM | POA: Diagnosis not present

## 2016-11-14 DIAGNOSIS — Z9181 History of falling: Secondary | ICD-10-CM | POA: Diagnosis not present

## 2016-11-14 DIAGNOSIS — R5381 Other malaise: Secondary | ICD-10-CM

## 2016-11-14 DIAGNOSIS — F0150 Vascular dementia without behavioral disturbance: Secondary | ICD-10-CM

## 2016-11-14 DIAGNOSIS — M159 Polyosteoarthritis, unspecified: Secondary | ICD-10-CM

## 2016-11-14 DIAGNOSIS — M62561 Muscle wasting and atrophy, not elsewhere classified, right lower leg: Secondary | ICD-10-CM | POA: Diagnosis not present

## 2016-11-14 DIAGNOSIS — I679 Cerebrovascular disease, unspecified: Secondary | ICD-10-CM

## 2016-11-14 DIAGNOSIS — R2689 Other abnormalities of gait and mobility: Secondary | ICD-10-CM | POA: Diagnosis not present

## 2016-11-14 DIAGNOSIS — R251 Tremor, unspecified: Secondary | ICD-10-CM | POA: Diagnosis not present

## 2016-11-14 DIAGNOSIS — G609 Hereditary and idiopathic neuropathy, unspecified: Secondary | ICD-10-CM | POA: Diagnosis not present

## 2016-11-14 DIAGNOSIS — K5903 Drug induced constipation: Secondary | ICD-10-CM

## 2016-11-14 DIAGNOSIS — M62562 Muscle wasting and atrophy, not elsewhere classified, left lower leg: Secondary | ICD-10-CM | POA: Diagnosis not present

## 2016-11-14 NOTE — Progress Notes (Signed)
Location:  Medical illustrator of Service:  SNF (31) Provider:   Peggye Ley, ANP [facility] [phone]  [name], DO  Patient Care Team: [name], DO as PCP - General (Geriatric Medicine) [name], [name], NP as Nurse Practitioner (Nurse Practitioner) Marvel Plan, MD as Consulting Physician (Neurology)  Extended Emergency Contact Information Primary Emergency Contact: [name] RIDGE [address] [address] [place] Home Phone: [phone] Mobile Phone: [phone] Relation: Daughter Secondary Emergency Contact: Reed,Tilden  [place] Home Phone: [phone] Mobile Phone: [phone] Relation: Relative  Code Status:  DNR Goals of care: Advanced Directive information Advanced Directives 11/18/2016  Does Patient Have a Medical Advance Directive? Yes  Type of Advance Directive Out of facility DNR (pink MOST or yellow form);Healthcare Power of Attorney  Does patient want to make changes to medical advance directive? -  Copy of Healthcare Power of Attorney in Chart? Yes  Pre-existing out of facility DNR order (yellow form or pink MOST form) Yellow form placed in chart (order not valid for inpatient use)     Chief Complaint  Patient presents with  . Medical Management of Chronic Issues    HPI:  Pt is a 83 y.o. female seen today for medical management of chronic diseases. She resides in skilled care.  She has a hx of CVA with left leg weakness and has become progressively weaker over time and is requiring the stand up lift. Physical therapy has started working with her this week to improve her mobility and strength. She has chronic pain from OA to her neck, low back and knees. This is a chronic stable problem. She has found that the 7.5 mg dose of hydrocodone helps at night as the pain was keeping her up at night. She reports regular BMs after increasing   senna She has progressive short term memory loss due to mixed dementia 18/30 MMSE in April of 2018 GERD: takes protonix and mintox each evening, does not have symptoms per resident  Past Medical History:  Diagnosis Date  . Abnormality of gait   . Allergic rhinitis   . Anxiety   . Arthritis   . Carotid stenosis, right    asymptomatic  <40%  right  ica  and right eca  . Carpal tunnel syndrome on both sides   . Claudication, intermittent (HCC)   . COPD (chronic obstructive pulmonary disease) (HCC)   . Diverticulosis of colon   . Fracture, calcaneus closed   . History of esophagitis   . History of hypothyroidism   . Hypertension   . Internal hemorrhoids   . Irritable bowel syndrome 03/11/2010  . PAD (peripheral artery disease) (HCC) monitored by dr Rubye Oaks   moderate  . Peripheral vascular disease (HCC)   . Stroke (HCC)   . Unspecified hereditary and idiopathic peripheral neuropathy   . Urge urinary incontinence    Past Surgical History:  Procedure Laterality Date  . CAROTID ENDARTERECTOMY Left 11-18-2007  . CATARACT EXTRACTION W/ INTRAOCULAR LENS IMPLANT Right 11/2012  . COLONOSCOPY  2005   mild diverticulosis  . CYSTOSCOPY WITH INJECTION N/A 09/06/2013   Procedure: CYSTOSCOPY WITH BOTOX  INJECTION;  Surgeon: Martina Sinner, MD;  Location: [facility];  Service: Urology;  Laterality: N/A;  . DILATION AND CURETTAGE OF UTERUS  1973    Allergies  Allergen Reactions  . Tramadol Nausea Only    Outpatient  Encounter Prescriptions as of 11/14/2016  Medication Sig  . acetaminophen (TYLENOL) 500 MG tablet Take 500 mg by mouth 2 (two) times daily.  Marland Kitchen. alum & mag hydroxide-simeth (MINTOX) 200-200-20 MG/5ML suspension Take 15 mLs by mouth at bedtime. And prn indigestion  . amLODipine (NORVASC) 10 MG tablet Take 10 mg by mouth daily. for blood pressure  . atorvastatin (LIPITOR) 20 MG tablet Take 1 tablet (20 mg total) by mouth daily at 6 PM.  . clopidogrel (PLAVIX)  75 MG tablet Take 75 mg by mouth daily.   . eszopiclone (LUNESTA) 1 MG TABS tablet Take 1 mg by mouth at bedtime. Take immediately before bedtime  . HYDROcodone-acetaminophen (NORCO) 7.5-325 MG tablet Take 1 tablet by mouth every 6 (six) hours as needed for moderate pain. (control)  . Menthol, Topical Analgesic, (BIOFREEZE EX) Apply 1 application topically as needed. To neck and knees and twice daily as needed to any affected joint.   . mirabegron ER (MYRBETRIQ) 50 MG TB24 tablet Take 50 mg by mouth at bedtime.   . pantoprazole (PROTONIX) 40 MG tablet Take 40 mg by mouth daily.  Bertram Gala. Polyethyl Glycol-Propyl Glycol (SYSTANE) 0.4-0.3 % GEL ophthalmic gel Place 1 application into both eyes 4 (four) times daily.  . polyethylene glycol powder (GLYCOLAX/MIRALAX) powder Take 17 g by mouth every other day. Mix in 8 oz liquid and drink  . senna-docusate (SENNA S) 8.6-50 MG tablet Take 2 tablets by mouth 2 (two) times daily as needed for mild constipation.   . SUPER B COMPLEX/C PO Take 2 tablets by mouth daily.   No facility-administered encounter medications on file as of 11/14/2016.     Review of Systems  Constitutional: Positive for activity change. Negative for appetite change, chills, diaphoresis, fatigue, fever and unexpected weight change.  HENT: Negative for congestion.   Respiratory: Negative for cough, shortness of breath and wheezing.   Cardiovascular: Positive for leg swelling. Negative for chest pain and palpitations.  Gastrointestinal: Positive for constipation. Negative for abdominal distention, abdominal pain and diarrhea.  Genitourinary: Negative for difficulty urinating and dysuria.  Musculoskeletal: Positive for arthralgias, back pain and gait problem. Negative for joint swelling and myalgias.  Neurological: Positive for weakness. Negative for dizziness, tremors, seizures, syncope, facial asymmetry, speech difficulty, light-headedness, numbness and headaches.  Psychiatric/Behavioral:  Positive for confusion. Negative for agitation and behavioral problems.    Immunization History  Administered Date(s) Administered  . DTaP 06/03/2016  . Influenza Inj Mdck Quad Pf 03/13/2016  . Influenza-Unspecified 03/09/2013, 03/01/2014, 03/08/2015  . PPD Test 12/04/2011  . Pneumococcal Conjugate-13 09/12/2014  . Pneumococcal Polysaccharide-23 09/12/2015  . Td 06/03/2016   Pertinent  Health Maintenance Due  Topic Date Due  . INFLUENZA VACCINE  12/24/2016  . PNA vac Low Risk Adult  Completed   Fall Risk  10/21/2016 09/10/2016 02/18/2016 09/12/2015 03/21/2015  Falls in the past year? Yes Yes No No No  Number falls in past yr: 1 1 - - -  Injury with Fall? No No - - -  Risk for fall due to : - History of fall(s) - - -  Follow up - Education provided - - -   Functional Status Survey:    Vitals:   11/14/16 1128  BP: 127/72  Pulse: 63  Resp: 14  Temp: 97.2 F (36.2 C)  SpO2: 91%  Weight: 171 lb 1.6 oz (77.6 kg)   Body mass index is 28.47 kg/m. Physical Exam  Constitutional: She is oriented to person, place, and time. No distress.  HENT:  Head: Normocephalic and atraumatic.  Neck: No JVD present.  Cardiovascular: Normal rate and regular rhythm.   No murmur heard. Pulmonary/Chest: Effort normal and breath sounds normal. No respiratory distress. She has no wheezes.  Abdominal: Soft. Bowel sounds are normal.  Musculoskeletal: She exhibits no edema or tenderness.  Kyphosis, LLE weakness unchanged.  Neurological: She is alert and oriented to person, place, and time.  Poor recall  Skin: Skin is warm and dry. She is not diaphoretic.  Psychiatric: She has a normal mood and affect.    Labs reviewed:  Recent Labs  04/14/16  NA 140  K 4.3  BUN 25*  CREATININE 0.6    Recent Labs  04/14/16  AST 17  ALT 17  ALKPHOS 89    Recent Labs  04/14/16  WBC 6.5  HGB 13.0  HCT 40  PLT 181   Lab Results  Component Value Date   TSH 3.16 09/20/2015   Lab Results   Component Value Date   HGBA1C 6.0 (H) 10/14/2015   Lab Results  Component Value Date   CHOL 181 10/14/2015   HDL 57 10/14/2015   LDLCALC 108 (H) 10/14/2015   TRIG 79 10/14/2015   CHOLHDL 3.2 10/14/2015    Significant Diagnostic Results in last 30 days:  No results found.  Assessment/Plan 1. Physical deconditioning Monitor for improvement with PT No signs of an acute event that led to this, rather a progressive loss of function  2. Gastroesophageal reflux disease without esophagitis D/C scheduled mintox and continue prn dosing, report symptoms Continue Protonix 40 mg qd  3. Essential hypertension Controlled Continue Norvasc  4. Cerebrovascular disease BP controlled Check lipid panel per recommendations of neurology (h/o CVA x 2)  Continue Lipitor 20 mg qd  5. Drug-induced constipation Continue miralax and senokot s   6. Mixed Alzheimer's and vascular dementia Progressive functional and cognitive decline Would not benefit from medication  7. OA multiple sites Continues to report pain but feels the higher dose of norco is helping Continue Norco 1 tab qhs and q 6 hrs prn pain  Family/ staff Communication: discussed with resident  Labs/tests ordered:  CMP lipid panel

## 2016-11-17 DIAGNOSIS — G609 Hereditary and idiopathic neuropathy, unspecified: Secondary | ICD-10-CM | POA: Diagnosis not present

## 2016-11-17 DIAGNOSIS — M62562 Muscle wasting and atrophy, not elsewhere classified, left lower leg: Secondary | ICD-10-CM | POA: Diagnosis not present

## 2016-11-17 DIAGNOSIS — R2689 Other abnormalities of gait and mobility: Secondary | ICD-10-CM | POA: Diagnosis not present

## 2016-11-17 DIAGNOSIS — E785 Hyperlipidemia, unspecified: Secondary | ICD-10-CM | POA: Diagnosis not present

## 2016-11-17 DIAGNOSIS — F0150 Vascular dementia without behavioral disturbance: Secondary | ICD-10-CM | POA: Diagnosis not present

## 2016-11-17 DIAGNOSIS — R278 Other lack of coordination: Secondary | ICD-10-CM | POA: Diagnosis not present

## 2016-11-17 DIAGNOSIS — M62561 Muscle wasting and atrophy, not elsewhere classified, right lower leg: Secondary | ICD-10-CM | POA: Diagnosis not present

## 2016-11-17 LAB — BASIC METABOLIC PANEL
BUN: 27 — AB (ref 4–21)
Creatinine: 0.8 (ref 0.5–1.1)
GLUCOSE: 117
POTASSIUM: 4 (ref 3.4–5.3)
SODIUM: 139 (ref 137–147)

## 2016-11-17 LAB — LIPID PANEL
CHOLESTEROL: 166 (ref 0–200)
HDL: 106 — AB (ref 35–70)
LDL CALC: 53
TRIGLYCERIDES: 36 — AB (ref 40–160)

## 2016-11-18 ENCOUNTER — Encounter: Payer: Self-pay | Admitting: Adult Health

## 2016-11-18 DIAGNOSIS — M62562 Muscle wasting and atrophy, not elsewhere classified, left lower leg: Secondary | ICD-10-CM | POA: Diagnosis not present

## 2016-11-18 DIAGNOSIS — F0150 Vascular dementia without behavioral disturbance: Secondary | ICD-10-CM | POA: Diagnosis not present

## 2016-11-18 DIAGNOSIS — M62561 Muscle wasting and atrophy, not elsewhere classified, right lower leg: Secondary | ICD-10-CM | POA: Diagnosis not present

## 2016-11-18 DIAGNOSIS — G609 Hereditary and idiopathic neuropathy, unspecified: Secondary | ICD-10-CM | POA: Diagnosis not present

## 2016-11-18 DIAGNOSIS — R2689 Other abnormalities of gait and mobility: Secondary | ICD-10-CM | POA: Diagnosis not present

## 2016-11-18 DIAGNOSIS — R278 Other lack of coordination: Secondary | ICD-10-CM | POA: Diagnosis not present

## 2016-11-19 DIAGNOSIS — R278 Other lack of coordination: Secondary | ICD-10-CM | POA: Diagnosis not present

## 2016-11-19 DIAGNOSIS — F0150 Vascular dementia without behavioral disturbance: Secondary | ICD-10-CM | POA: Diagnosis not present

## 2016-11-19 DIAGNOSIS — M62562 Muscle wasting and atrophy, not elsewhere classified, left lower leg: Secondary | ICD-10-CM | POA: Diagnosis not present

## 2016-11-19 DIAGNOSIS — R2689 Other abnormalities of gait and mobility: Secondary | ICD-10-CM | POA: Diagnosis not present

## 2016-11-19 DIAGNOSIS — G609 Hereditary and idiopathic neuropathy, unspecified: Secondary | ICD-10-CM | POA: Diagnosis not present

## 2016-11-19 DIAGNOSIS — M62561 Muscle wasting and atrophy, not elsewhere classified, right lower leg: Secondary | ICD-10-CM | POA: Diagnosis not present

## 2016-11-20 DIAGNOSIS — F0150 Vascular dementia without behavioral disturbance: Secondary | ICD-10-CM | POA: Diagnosis not present

## 2016-11-20 DIAGNOSIS — R278 Other lack of coordination: Secondary | ICD-10-CM | POA: Diagnosis not present

## 2016-11-20 DIAGNOSIS — R2689 Other abnormalities of gait and mobility: Secondary | ICD-10-CM | POA: Diagnosis not present

## 2016-11-20 DIAGNOSIS — G609 Hereditary and idiopathic neuropathy, unspecified: Secondary | ICD-10-CM | POA: Diagnosis not present

## 2016-11-20 DIAGNOSIS — M62561 Muscle wasting and atrophy, not elsewhere classified, right lower leg: Secondary | ICD-10-CM | POA: Diagnosis not present

## 2016-11-20 DIAGNOSIS — M62562 Muscle wasting and atrophy, not elsewhere classified, left lower leg: Secondary | ICD-10-CM | POA: Diagnosis not present

## 2016-11-21 DIAGNOSIS — G609 Hereditary and idiopathic neuropathy, unspecified: Secondary | ICD-10-CM | POA: Diagnosis not present

## 2016-11-21 DIAGNOSIS — R278 Other lack of coordination: Secondary | ICD-10-CM | POA: Diagnosis not present

## 2016-11-21 DIAGNOSIS — F0150 Vascular dementia without behavioral disturbance: Secondary | ICD-10-CM | POA: Diagnosis not present

## 2016-11-21 DIAGNOSIS — M62562 Muscle wasting and atrophy, not elsewhere classified, left lower leg: Secondary | ICD-10-CM | POA: Diagnosis not present

## 2016-11-21 DIAGNOSIS — R2689 Other abnormalities of gait and mobility: Secondary | ICD-10-CM | POA: Diagnosis not present

## 2016-11-21 DIAGNOSIS — M62561 Muscle wasting and atrophy, not elsewhere classified, right lower leg: Secondary | ICD-10-CM | POA: Diagnosis not present

## 2016-11-24 DIAGNOSIS — G609 Hereditary and idiopathic neuropathy, unspecified: Secondary | ICD-10-CM | POA: Diagnosis not present

## 2016-11-24 DIAGNOSIS — R278 Other lack of coordination: Secondary | ICD-10-CM | POA: Diagnosis not present

## 2016-11-24 DIAGNOSIS — F0150 Vascular dementia without behavioral disturbance: Secondary | ICD-10-CM | POA: Diagnosis not present

## 2016-11-24 DIAGNOSIS — M170 Bilateral primary osteoarthritis of knee: Secondary | ICD-10-CM | POA: Diagnosis not present

## 2016-11-24 DIAGNOSIS — Z9181 History of falling: Secondary | ICD-10-CM | POA: Diagnosis not present

## 2016-11-24 DIAGNOSIS — M62561 Muscle wasting and atrophy, not elsewhere classified, right lower leg: Secondary | ICD-10-CM | POA: Diagnosis not present

## 2016-11-24 DIAGNOSIS — R2689 Other abnormalities of gait and mobility: Secondary | ICD-10-CM | POA: Diagnosis not present

## 2016-11-24 DIAGNOSIS — M62562 Muscle wasting and atrophy, not elsewhere classified, left lower leg: Secondary | ICD-10-CM | POA: Diagnosis not present

## 2016-11-24 DIAGNOSIS — R251 Tremor, unspecified: Secondary | ICD-10-CM | POA: Diagnosis not present

## 2016-11-24 DIAGNOSIS — M159 Polyosteoarthritis, unspecified: Secondary | ICD-10-CM | POA: Diagnosis not present

## 2016-11-25 DIAGNOSIS — R2689 Other abnormalities of gait and mobility: Secondary | ICD-10-CM | POA: Diagnosis not present

## 2016-11-25 DIAGNOSIS — F0150 Vascular dementia without behavioral disturbance: Secondary | ICD-10-CM | POA: Diagnosis not present

## 2016-11-25 DIAGNOSIS — M62561 Muscle wasting and atrophy, not elsewhere classified, right lower leg: Secondary | ICD-10-CM | POA: Diagnosis not present

## 2016-11-25 DIAGNOSIS — G609 Hereditary and idiopathic neuropathy, unspecified: Secondary | ICD-10-CM | POA: Diagnosis not present

## 2016-11-25 DIAGNOSIS — M62562 Muscle wasting and atrophy, not elsewhere classified, left lower leg: Secondary | ICD-10-CM | POA: Diagnosis not present

## 2016-11-25 DIAGNOSIS — R278 Other lack of coordination: Secondary | ICD-10-CM | POA: Diagnosis not present

## 2016-11-26 DIAGNOSIS — G609 Hereditary and idiopathic neuropathy, unspecified: Secondary | ICD-10-CM | POA: Diagnosis not present

## 2016-11-26 DIAGNOSIS — R2689 Other abnormalities of gait and mobility: Secondary | ICD-10-CM | POA: Diagnosis not present

## 2016-11-26 DIAGNOSIS — F0150 Vascular dementia without behavioral disturbance: Secondary | ICD-10-CM | POA: Diagnosis not present

## 2016-11-26 DIAGNOSIS — M62561 Muscle wasting and atrophy, not elsewhere classified, right lower leg: Secondary | ICD-10-CM | POA: Diagnosis not present

## 2016-11-26 DIAGNOSIS — R278 Other lack of coordination: Secondary | ICD-10-CM | POA: Diagnosis not present

## 2016-11-26 DIAGNOSIS — M62562 Muscle wasting and atrophy, not elsewhere classified, left lower leg: Secondary | ICD-10-CM | POA: Diagnosis not present

## 2016-11-27 DIAGNOSIS — R2689 Other abnormalities of gait and mobility: Secondary | ICD-10-CM | POA: Diagnosis not present

## 2016-11-27 DIAGNOSIS — G609 Hereditary and idiopathic neuropathy, unspecified: Secondary | ICD-10-CM | POA: Diagnosis not present

## 2016-11-27 DIAGNOSIS — M62561 Muscle wasting and atrophy, not elsewhere classified, right lower leg: Secondary | ICD-10-CM | POA: Diagnosis not present

## 2016-11-27 DIAGNOSIS — M62562 Muscle wasting and atrophy, not elsewhere classified, left lower leg: Secondary | ICD-10-CM | POA: Diagnosis not present

## 2016-11-27 DIAGNOSIS — F0150 Vascular dementia without behavioral disturbance: Secondary | ICD-10-CM | POA: Diagnosis not present

## 2016-11-27 DIAGNOSIS — R278 Other lack of coordination: Secondary | ICD-10-CM | POA: Diagnosis not present

## 2016-11-27 IMAGING — MR MR HEAD W/O CM
8 of 10 series · 35 of 48 positions shown · non-contrast
Comparison: CT head without contrast 10/01/2015.

CLINICAL DATA: Left hand numbness beginning at [DATE] p.m.
yesterday. Left facial droop in leaning to the left notice today.
Left hand numbness has improved.

EXAM:
MRI HEAD WITHOUT CONTRAST
TECHNIQUE: Multiplanar, multiecho pulse sequences of the brain and surrounding
structures were obtained without intravenous contrast.

[Series 4: DWI · axial · 3.0mm · 1.09mm/px · z∈[-37,+113]mm · 9 of 102 slices shown (1 of 4)]
[im 1/102]
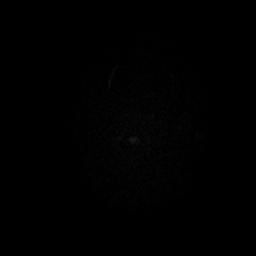
[im 13/102]
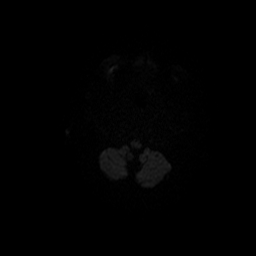
[im 26/102]
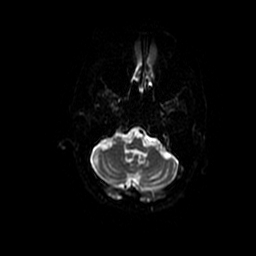
[im 38/102]
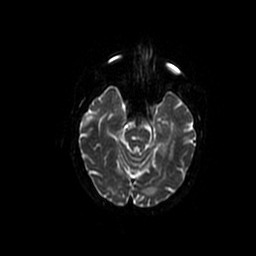
[im 51/102]
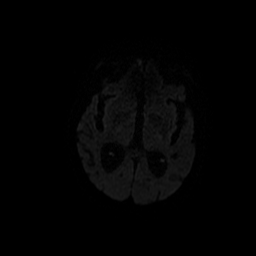
[im 64/102]
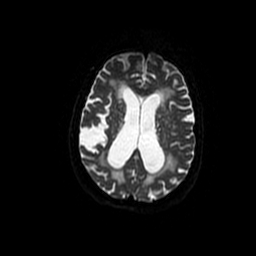
[im 76/102]
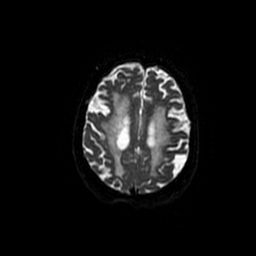
[im 89/102]
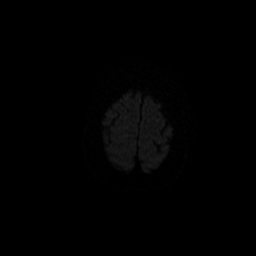
[im 102/102]
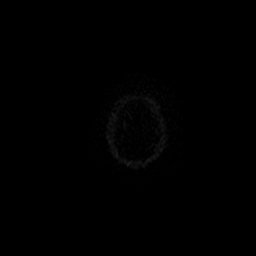

[Series 5: DWI · coronal · 5.0mm · 1.09mm/px · 6 of 74 slices shown (2 of 4)]
[im 1/74]
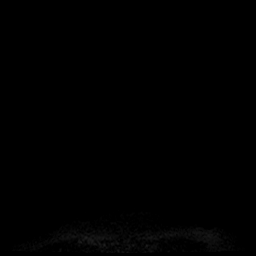
[im 15/74]
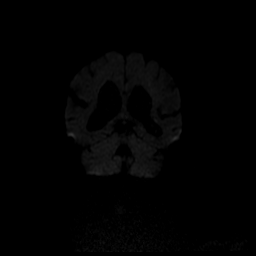
[im 30/74]
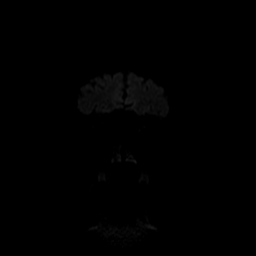
[im 44/74]
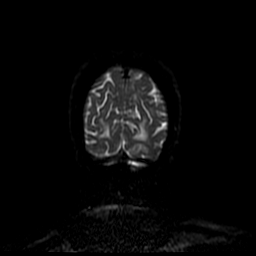
[im 59/74]
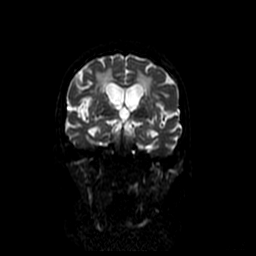
[im 74/74]
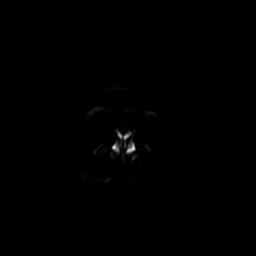

[Series 6: T1 · sagittal · 5.0mm · 0.47mm/px · 2 of 24 slices shown]
[im 1/24]
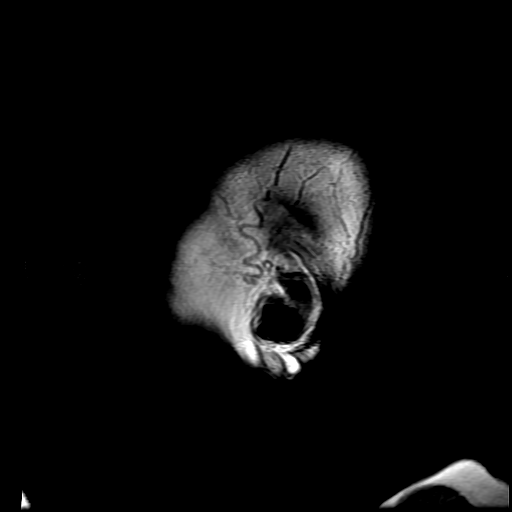
[im 24/24]
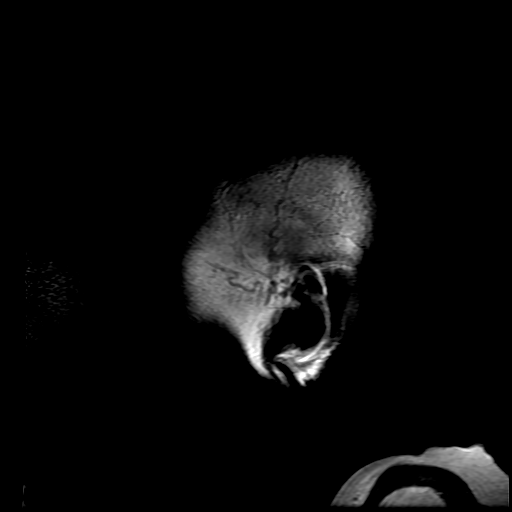

[Series 7: T2 · axial · 5.0mm · 0.43mm/px · z∈[-40,+116]mm · 3 of 27 slices shown]
[im 1/27]
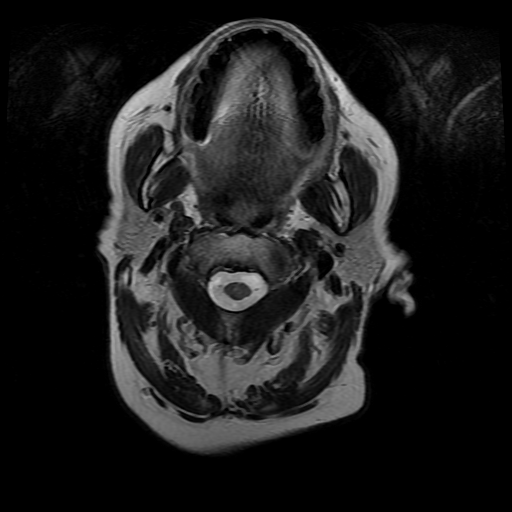
[im 14/27]
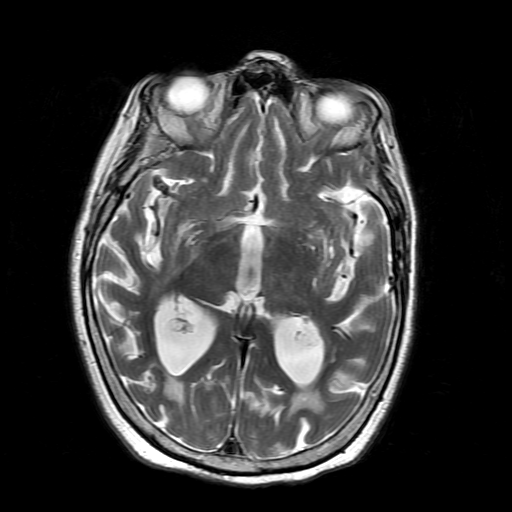
[im 27/27]
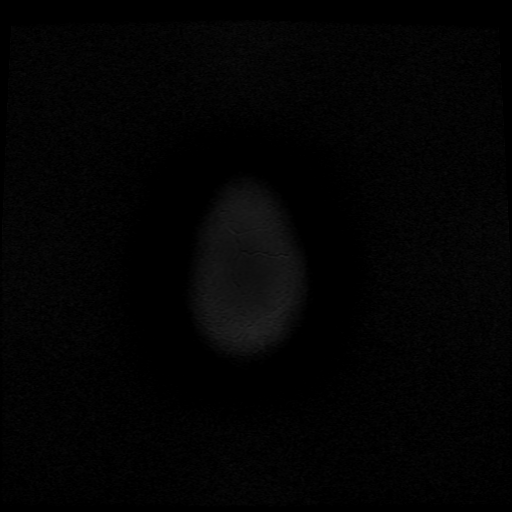

[Series 8: FLAIR · axial · 5.0mm · 0.43mm/px · z∈[-40,+116]mm · 3 of 27 slices shown]
[im 1/27]
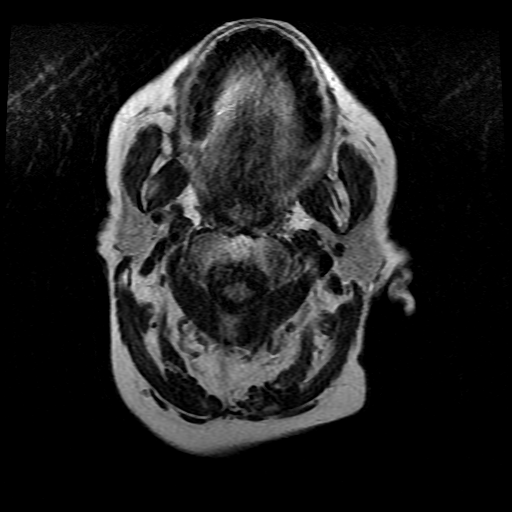
[im 14/27]
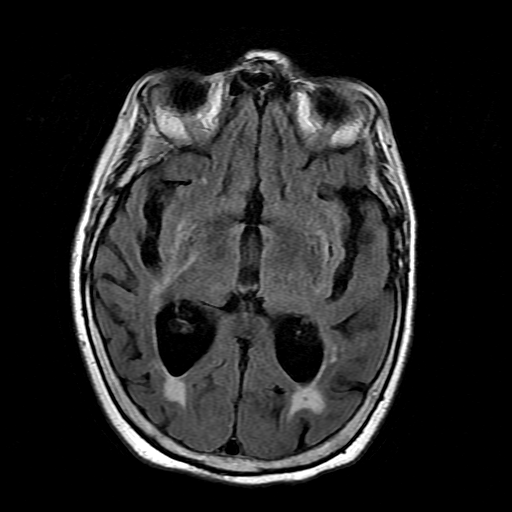
[im 27/27]
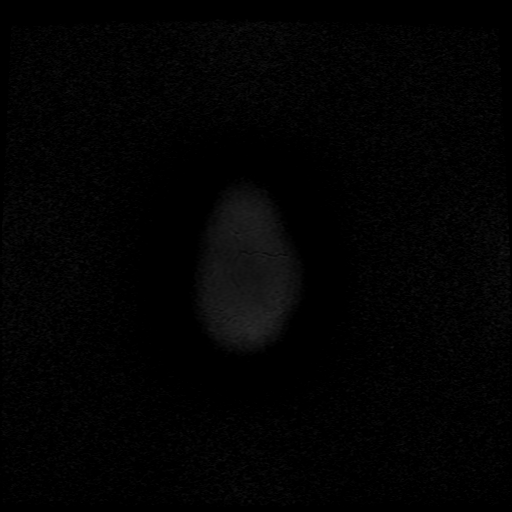

[Series 11: T2 post-contrast · coronal · 5.0mm · 0.39mm/px · 3 of 28 slices shown]
[im 1/28]
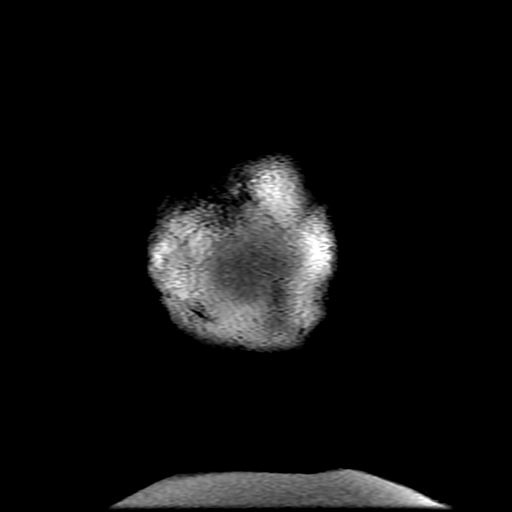
[im 14/28]
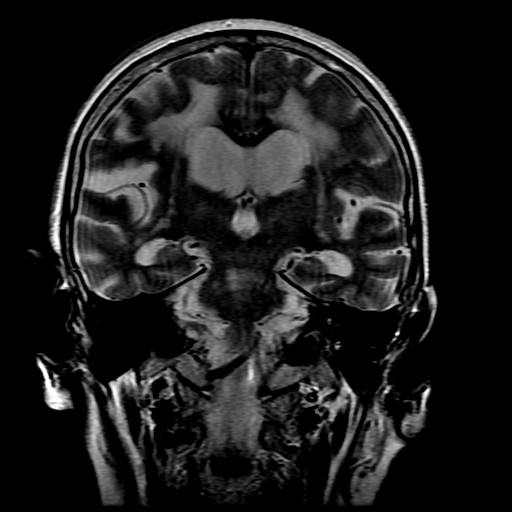
[im 28/28]
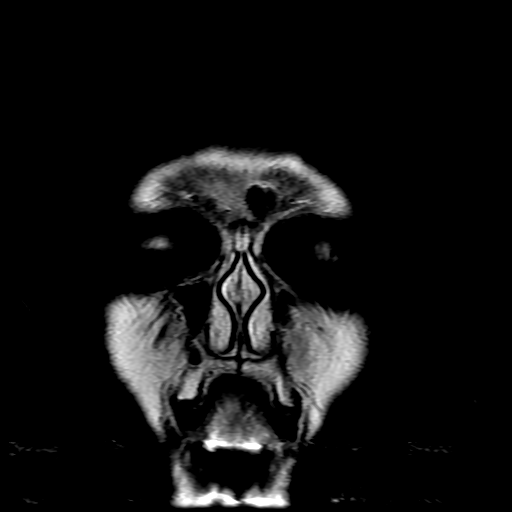

[Series 400: DWI · axial · 3.0mm · 1.09mm/px · z∈[-37,+113]mm · 5 of 51 slices shown (3 of 4)]
[im 1/51]
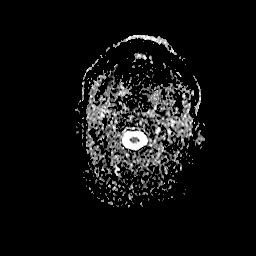
[im 13/51]
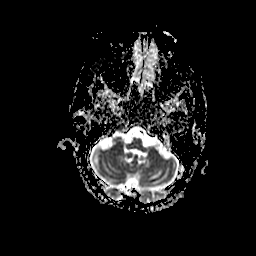
[im 26/51]
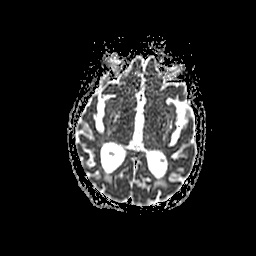
[im 38/51]
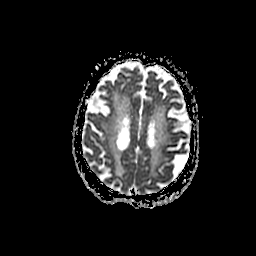
[im 51/51]
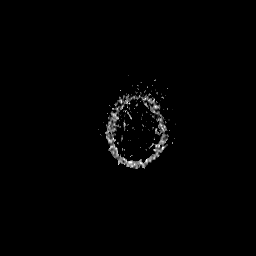

[Series 500: DWI · coronal · 5.0mm · 1.09mm/px · 4 of 37 slices shown (4 of 4)]
[im 1/37]
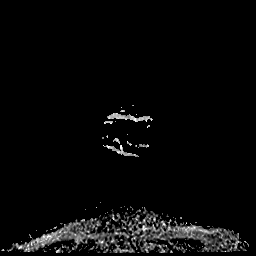
[im 13/37]
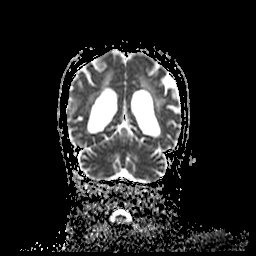
[im 25/37]
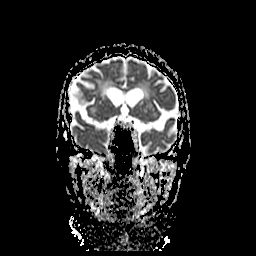
[im 37/37]
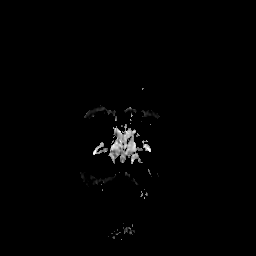

[35 of 48 positions shown; findings below may reference images not displayed]

FINDINGS: 2 punctate foci of acute nonhemorrhagic infarction are present in
the posterior right frontal lobe along the precentral gyrus. No
other areas of acute or subacute infarction are present. No acute
hemorrhage or mass lesion is evident.

Advanced atrophy and diffuse white matter disease is present
bilaterally. Remote cortical infarcts are present in the right pre
frontal gyrus. The ventricles are proportionate to the degree of
atrophy. Multiple remote lacunar infarcts are present in the basal
ganglia bilaterally. A remote right paramedian pontine infarct is
present. White matter changes extend into the central pons. The
cerebellum is normal.

Flow is present in the major intracranial arteries. A right lens
replacement is noted. The globes and orbits are otherwise intact.
The paranasal sinuses and mastoid air cells are clear.

The skullbase is normal.  Midline sagittal images are unremarkable.
IMPRESSION: 1. Two punctate acute nonhemorrhagic infarcts within the posterior
right frontal lobe along the precentral gyrus. These may be related
to acute motor symptoms.
2. Advanced atrophy and diffuse white matter disease compatible with
chronic microvascular ischemia.
3. Remote lacunar infarcts of the basal ganglia and brainstem.

## 2016-12-01 DIAGNOSIS — R2689 Other abnormalities of gait and mobility: Secondary | ICD-10-CM | POA: Diagnosis not present

## 2016-12-01 DIAGNOSIS — M62562 Muscle wasting and atrophy, not elsewhere classified, left lower leg: Secondary | ICD-10-CM | POA: Diagnosis not present

## 2016-12-01 DIAGNOSIS — R278 Other lack of coordination: Secondary | ICD-10-CM | POA: Diagnosis not present

## 2016-12-01 DIAGNOSIS — G609 Hereditary and idiopathic neuropathy, unspecified: Secondary | ICD-10-CM | POA: Diagnosis not present

## 2016-12-01 DIAGNOSIS — F0150 Vascular dementia without behavioral disturbance: Secondary | ICD-10-CM | POA: Diagnosis not present

## 2016-12-01 DIAGNOSIS — M62561 Muscle wasting and atrophy, not elsewhere classified, right lower leg: Secondary | ICD-10-CM | POA: Diagnosis not present

## 2016-12-03 DIAGNOSIS — G609 Hereditary and idiopathic neuropathy, unspecified: Secondary | ICD-10-CM | POA: Diagnosis not present

## 2016-12-03 DIAGNOSIS — F0150 Vascular dementia without behavioral disturbance: Secondary | ICD-10-CM | POA: Diagnosis not present

## 2016-12-03 DIAGNOSIS — R278 Other lack of coordination: Secondary | ICD-10-CM | POA: Diagnosis not present

## 2016-12-03 DIAGNOSIS — M62561 Muscle wasting and atrophy, not elsewhere classified, right lower leg: Secondary | ICD-10-CM | POA: Diagnosis not present

## 2016-12-03 DIAGNOSIS — M62562 Muscle wasting and atrophy, not elsewhere classified, left lower leg: Secondary | ICD-10-CM | POA: Diagnosis not present

## 2016-12-03 DIAGNOSIS — R2689 Other abnormalities of gait and mobility: Secondary | ICD-10-CM | POA: Diagnosis not present

## 2016-12-04 DIAGNOSIS — M62562 Muscle wasting and atrophy, not elsewhere classified, left lower leg: Secondary | ICD-10-CM | POA: Diagnosis not present

## 2016-12-04 DIAGNOSIS — F0150 Vascular dementia without behavioral disturbance: Secondary | ICD-10-CM | POA: Diagnosis not present

## 2016-12-04 DIAGNOSIS — R2689 Other abnormalities of gait and mobility: Secondary | ICD-10-CM | POA: Diagnosis not present

## 2016-12-04 DIAGNOSIS — G609 Hereditary and idiopathic neuropathy, unspecified: Secondary | ICD-10-CM | POA: Diagnosis not present

## 2016-12-04 DIAGNOSIS — M62561 Muscle wasting and atrophy, not elsewhere classified, right lower leg: Secondary | ICD-10-CM | POA: Diagnosis not present

## 2016-12-04 DIAGNOSIS — R278 Other lack of coordination: Secondary | ICD-10-CM | POA: Diagnosis not present

## 2016-12-05 DIAGNOSIS — G609 Hereditary and idiopathic neuropathy, unspecified: Secondary | ICD-10-CM | POA: Diagnosis not present

## 2016-12-05 DIAGNOSIS — M62562 Muscle wasting and atrophy, not elsewhere classified, left lower leg: Secondary | ICD-10-CM | POA: Diagnosis not present

## 2016-12-05 DIAGNOSIS — M62561 Muscle wasting and atrophy, not elsewhere classified, right lower leg: Secondary | ICD-10-CM | POA: Diagnosis not present

## 2016-12-05 DIAGNOSIS — R2689 Other abnormalities of gait and mobility: Secondary | ICD-10-CM | POA: Diagnosis not present

## 2016-12-05 DIAGNOSIS — R278 Other lack of coordination: Secondary | ICD-10-CM | POA: Diagnosis not present

## 2016-12-05 DIAGNOSIS — F0150 Vascular dementia without behavioral disturbance: Secondary | ICD-10-CM | POA: Diagnosis not present

## 2016-12-08 DIAGNOSIS — R278 Other lack of coordination: Secondary | ICD-10-CM | POA: Diagnosis not present

## 2016-12-08 DIAGNOSIS — M62561 Muscle wasting and atrophy, not elsewhere classified, right lower leg: Secondary | ICD-10-CM | POA: Diagnosis not present

## 2016-12-08 DIAGNOSIS — F0150 Vascular dementia without behavioral disturbance: Secondary | ICD-10-CM | POA: Diagnosis not present

## 2016-12-08 DIAGNOSIS — G609 Hereditary and idiopathic neuropathy, unspecified: Secondary | ICD-10-CM | POA: Diagnosis not present

## 2016-12-08 DIAGNOSIS — M62562 Muscle wasting and atrophy, not elsewhere classified, left lower leg: Secondary | ICD-10-CM | POA: Diagnosis not present

## 2016-12-08 DIAGNOSIS — R2689 Other abnormalities of gait and mobility: Secondary | ICD-10-CM | POA: Diagnosis not present

## 2016-12-11 ENCOUNTER — Non-Acute Institutional Stay (SKILLED_NURSING_FACILITY): Payer: Medicare Other | Admitting: Adult Health

## 2016-12-11 ENCOUNTER — Encounter: Payer: Self-pay | Admitting: Adult Health

## 2016-12-11 DIAGNOSIS — M62561 Muscle wasting and atrophy, not elsewhere classified, right lower leg: Secondary | ICD-10-CM | POA: Diagnosis not present

## 2016-12-11 DIAGNOSIS — F0280 Dementia in other diseases classified elsewhere without behavioral disturbance: Secondary | ICD-10-CM | POA: Diagnosis not present

## 2016-12-11 DIAGNOSIS — M62562 Muscle wasting and atrophy, not elsewhere classified, left lower leg: Secondary | ICD-10-CM | POA: Diagnosis not present

## 2016-12-11 DIAGNOSIS — R278 Other lack of coordination: Secondary | ICD-10-CM | POA: Diagnosis not present

## 2016-12-11 DIAGNOSIS — G309 Alzheimer's disease, unspecified: Secondary | ICD-10-CM | POA: Diagnosis not present

## 2016-12-11 DIAGNOSIS — E785 Hyperlipidemia, unspecified: Secondary | ICD-10-CM | POA: Diagnosis not present

## 2016-12-11 DIAGNOSIS — F0150 Vascular dementia without behavioral disturbance: Secondary | ICD-10-CM | POA: Diagnosis not present

## 2016-12-11 DIAGNOSIS — R2689 Other abnormalities of gait and mobility: Secondary | ICD-10-CM | POA: Diagnosis not present

## 2016-12-11 DIAGNOSIS — I10 Essential (primary) hypertension: Secondary | ICD-10-CM

## 2016-12-11 DIAGNOSIS — I739 Peripheral vascular disease, unspecified: Secondary | ICD-10-CM

## 2016-12-11 DIAGNOSIS — M159 Polyosteoarthritis, unspecified: Secondary | ICD-10-CM | POA: Diagnosis not present

## 2016-12-11 DIAGNOSIS — G609 Hereditary and idiopathic neuropathy, unspecified: Secondary | ICD-10-CM | POA: Diagnosis not present

## 2016-12-11 NOTE — Progress Notes (Signed)
Location:  Medical illustrator of Service:  SNF (31) Provider:   Peggye Ley, ANP [facility] [phone]  Tara Balo, DO  Patient Care Team: Tara Balo, DO as PCP - General (Geriatric Medicine) [facility] [name], [name], NP as Nurse Practitioner (Nurse Practitioner) Marvel Plan, MD as Consulting Physician (Neurology)  Extended Emergency Contact Information Primary Emergency Contact: Tara Walters [address] [address] [place] Home Phone: [phone] Mobile Phone: [phone] Relation: Daughter Secondary Emergency Contact: Reed,Tilden  [place] Home Phone: [phone] Mobile Phone: [phone] Relation: Relative  Code Status:  DNR Goals of care: Advanced Directive information Advanced Directives 12/11/2016  Does Patient Have a Medical Advance Directive? Yes  Type of Advance Directive Out of facility DNR (pink MOST or yellow form);Healthcare Power of Attorney  Does patient want to make changes to medical advance directive? -  Copy of Healthcare Power of Attorney in Chart? Yes  Pre-existing out of facility DNR order (yellow form or pink MOST form) Yellow form placed in chart (order not valid for inpatient use)     Chief Complaint  Patient presents with  . Medical Management of Chronic Issues    HPI:  Pt is a 83 y.o. female seen today for medical management of chronic diseases. She resides in skilled care.  She has a hx of CVA with left leg weakness and has become progressively weaker over time.  PT has worked with her and she has made some gains and is now on a walking program but needs assistance (previously needed stand up lit) For our visit she reports feeling weak and shaky. CBG was 128.  She had just finished working with therapy. Has mild essential tremor that does not effect her ADLs. No issues with rigidity, festination, etc. She has chronic pain from OA to her  neck, low back and knees. This is a chronic stable problem. She has found that the 7.5 mg dose of hydrocodone helps at night as the pain was keeping her up at night. (used 6 x in two weeks) She has progressive short term memory loss due to mixed dementia 18/30 MMSE in April of 2018 GERD: takes protonix. Taken off scheduled mintox in June and has only need prn dosing times  HLD Lab Results  Component Value Date   CHOL 166 11/17/2016   HDL 106 (A) 11/17/2016   LDLCALC 53 11/17/2016   TRIG 36 (A) 11/17/2016   CHOLHDL 3.2 10/14/2015   No pain with ambulation per pt ABI: 08/2016 Right: 0.69 (0.61, 08-15-15); waveforms: PT: biphasic; DP: monophasic; TBI: 0.49 Left: 0.63 (0.69, 08-15-15); waveforms: biphasic; TBI: 0.43 ABI's remain stable with moderate arterial occlusive disease bilaterally, bi and monophasic waveforms.   Past Medical History:  Diagnosis Date  . Abnormality of gait   . Allergic rhinitis   . Anxiety   . Arthritis   . Carotid stenosis, right    asymptomatic  <40%  right  ica  and right eca  . Carpal tunnel syndrome on both sides   . Claudication, intermittent (HCC)   . COPD (chronic obstructive pulmonary disease) (HCC)   . Diverticulosis of colon   . Fracture, calcaneus closed   . History of esophagitis   . History of hypothyroidism   . Hypertension   . Internal hemorrhoids   . Irritable bowel syndrome 03/11/2010  . PAD (peripheral artery disease) (HCC) monitored by dr Rubye Oaks  moderate  . Peripheral vascular disease (HCC)   . Stroke (HCC)   . Unspecified hereditary and idiopathic peripheral neuropathy   . Urge urinary incontinence    Past Surgical History:  Procedure Laterality Date  . CAROTID ENDARTERECTOMY Left 11-18-2007  . CATARACT EXTRACTION W/ INTRAOCULAR LENS IMPLANT Right 11/2012  . COLONOSCOPY  2005   mild diverticulosis  . CYSTOSCOPY WITH INJECTION N/A 09/06/2013   Procedure: CYSTOSCOPY WITH BOTOX  INJECTION;  Surgeon: Martina Sinner, MD;   Location: [facility];  Service: Urology;  Laterality: N/A;  . DILATION AND CURETTAGE OF UTERUS  1973    Allergies  Allergen Reactions  . Tramadol Nausea Only    Outpatient Encounter Prescriptions as of 12/11/2016  Medication Sig  . acetaminophen (TYLENOL) 500 MG tablet Take 500 mg by mouth 2 (two) times daily.  Marland Kitchen alum & mag hydroxide-simeth (MINTOX) 200-200-20 MG/5ML suspension Take 15 mLs by mouth at bedtime. And prn indigestion  . amLODipine (NORVASC) 10 MG tablet Take 10 mg by mouth daily. for blood pressure  . atorvastatin (LIPITOR) 20 MG tablet Take 1 tablet (20 mg total) by mouth daily at 6 PM.  . clopidogrel (PLAVIX) 75 MG tablet Take 75 mg by mouth daily.   . eszopiclone (LUNESTA) 1 MG TABS tablet Take 1 mg by mouth at bedtime. Take immediately before bedtime  . HYDROcodone-acetaminophen (NORCO) 7.5-325 MG tablet Take 1 tablet by mouth every 6 (six) hours as needed for moderate pain. (control)  . Menthol, Topical Analgesic, (BIOFREEZE EX) Apply 1 application topically as needed. To neck and knees and twice daily as needed to any affected joint.   . mirabegron ER (MYRBETRIQ) 50 MG TB24 tablet Take 50 mg by mouth at bedtime.   . pantoprazole (PROTONIX) 40 MG tablet Take 40 mg by mouth daily.  Bertram Gala Glycol-Propyl Glycol (SYSTANE) 0.4-0.3 % GEL ophthalmic gel Place 1 application into both eyes 4 (four) times daily.  . polyethylene glycol powder (GLYCOLAX/MIRALAX) powder Take 17 g by mouth every other day. Mix in 8 oz liquid and drink  . senna-docusate (SENNA S) 8.6-50 MG tablet Take 2 tablets by mouth 2 (two) times daily as needed for mild constipation.   . SUPER B COMPLEX/C PO Take 2 tablets by mouth daily.   No facility-administered encounter medications on file as of 12/11/2016.     Review of Systems  Constitutional: Positive for fatigue. Negative for activity change, appetite change, chills, diaphoresis, fever and unexpected weight change.  HENT: Negative  for congestion.   Respiratory: Negative for cough, shortness of breath and wheezing.   Cardiovascular: Positive for leg swelling. Negative for chest pain and palpitations.  Gastrointestinal: Negative for abdominal distention, abdominal pain, constipation and diarrhea.  Genitourinary: Negative for difficulty urinating and dysuria.  Musculoskeletal: Positive for arthralgias, back pain and gait problem. Negative for joint swelling and myalgias.  Neurological: Positive for weakness. Negative for dizziness, tremors, seizures, syncope, facial asymmetry, speech difficulty, light-headedness, numbness and headaches.  Psychiatric/Behavioral: Positive for confusion. Negative for agitation and behavioral problems.    Immunization History  Administered Date(s) Administered  . DTaP 06/03/2016  . Influenza Inj Mdck Quad Pf 03/13/2016  . Influenza-Unspecified 03/09/2013, 03/01/2014, 03/08/2015  . PPD Test 12/04/2011  . Pneumococcal Conjugate-13 09/12/2014  . Pneumococcal Polysaccharide-23 09/12/2015  . Td 06/03/2016   Pertinent  Health Maintenance Due  Topic Date Due  . INFLUENZA VACCINE  12/24/2016  . PNA vac Low Risk Adult  Completed   Fall Risk  10/21/2016 09/10/2016  02/18/2016 09/12/2015 03/21/2015  Falls in the past year? Yes Yes No No No  Number falls in past yr: 1 1 - - -  Injury with Fall? No No - - -  Risk for fall due to : - History of fall(s) - - -  Follow up - Education provided - - -   Functional Status Survey:    Vitals:   12/11/16 1046  BP: 104/67  Pulse: 60  Resp: 20  Temp: (!) 97.4 F (36.3 C)  SpO2: 95%  Weight: 172 lb 4.8 oz (78.2 kg)   Body mass index is 28.67 kg/m. Physical Exam  Constitutional: She is oriented to person, place, and time. No distress.  HENT:  Head: Normocephalic and atraumatic.  Neck: No JVD present.  Cardiovascular: Normal rate and regular rhythm.   No murmur heard. Pulmonary/Chest: Effort normal and breath sounds normal. No respiratory  distress. She has no wheezes.  Abdominal: Soft. Bowel sounds are normal.  Musculoskeletal: She exhibits no edema or tenderness.  Kyphosis, LLE weakness unchanged.  Neurological: She is alert and oriented to person, place, and time.  Poor recall  Skin: Skin is warm and dry. She is not diaphoretic.  Psychiatric: She has a normal mood and affect.    Labs reviewed:  Recent Labs  04/14/16  NA 140  K 4.3  BUN 25*  CREATININE 0.6    Recent Labs  04/14/16  AST 17  ALT 17  ALKPHOS 89    Recent Labs  04/14/16  WBC 6.5  HGB 13.0  HCT 40  PLT 181   Lab Results  Component Value Date   TSH 3.16 09/20/2015   Lab Results  Component Value Date   HGBA1C 6.0 (H) 10/14/2015   Lab Results  Component Value Date   CHOL 166 11/17/2016   HDL 106 (A) 11/17/2016   LDLCALC 53 11/17/2016   TRIG 36 (A) 11/17/2016   CHOLHDL 3.2 10/14/2015    Significant Diagnostic Results in last 30 days:  No results found.  Assessment/Plan  1) HTN Decrease Norvasc to 5 mg qd due to feelings of weakness and borderline low BP Monitor BP qd x 1 week   2. Hyperlipidemia, unspecified hyperlipidemia type Continue lipitor at 20 mg qd  3. Generalized osteoarthritis of multiple sites Controlled Continue scheduled tylenol and Norco (has needed prn norco so would not reduce dosage)  4. Mixed Alzheimer's and vascular dementia Noted, continue to monitor cognitive function Encourage activity participation  5. PVD Moderate arterial occlusive disease but asymptomatic Continue ambulation program, and prevention with BP control, lipitor, also on plavix    Family/ staff Communication: discussed with resident  Labs/tests ordered:  NA

## 2016-12-15 DIAGNOSIS — F0150 Vascular dementia without behavioral disturbance: Secondary | ICD-10-CM | POA: Diagnosis not present

## 2016-12-15 DIAGNOSIS — G609 Hereditary and idiopathic neuropathy, unspecified: Secondary | ICD-10-CM | POA: Diagnosis not present

## 2016-12-15 DIAGNOSIS — R278 Other lack of coordination: Secondary | ICD-10-CM | POA: Diagnosis not present

## 2016-12-15 DIAGNOSIS — M62562 Muscle wasting and atrophy, not elsewhere classified, left lower leg: Secondary | ICD-10-CM | POA: Diagnosis not present

## 2016-12-15 DIAGNOSIS — M62561 Muscle wasting and atrophy, not elsewhere classified, right lower leg: Secondary | ICD-10-CM | POA: Diagnosis not present

## 2016-12-15 DIAGNOSIS — R2689 Other abnormalities of gait and mobility: Secondary | ICD-10-CM | POA: Diagnosis not present

## 2016-12-16 ENCOUNTER — Ambulatory Visit: Payer: Medicare Other | Admitting: Neurology

## 2016-12-16 DIAGNOSIS — M62561 Muscle wasting and atrophy, not elsewhere classified, right lower leg: Secondary | ICD-10-CM | POA: Diagnosis not present

## 2016-12-16 DIAGNOSIS — M62562 Muscle wasting and atrophy, not elsewhere classified, left lower leg: Secondary | ICD-10-CM | POA: Diagnosis not present

## 2016-12-16 DIAGNOSIS — R2689 Other abnormalities of gait and mobility: Secondary | ICD-10-CM | POA: Diagnosis not present

## 2016-12-16 DIAGNOSIS — G609 Hereditary and idiopathic neuropathy, unspecified: Secondary | ICD-10-CM | POA: Diagnosis not present

## 2016-12-16 DIAGNOSIS — F0150 Vascular dementia without behavioral disturbance: Secondary | ICD-10-CM | POA: Diagnosis not present

## 2016-12-16 DIAGNOSIS — R278 Other lack of coordination: Secondary | ICD-10-CM | POA: Diagnosis not present

## 2016-12-17 DIAGNOSIS — H26491 Other secondary cataract, right eye: Secondary | ICD-10-CM | POA: Diagnosis not present

## 2016-12-17 DIAGNOSIS — H35372 Puckering of macula, left eye: Secondary | ICD-10-CM | POA: Diagnosis not present

## 2016-12-17 DIAGNOSIS — H2512 Age-related nuclear cataract, left eye: Secondary | ICD-10-CM | POA: Diagnosis not present

## 2017-01-08 DIAGNOSIS — H26491 Other secondary cataract, right eye: Secondary | ICD-10-CM | POA: Diagnosis not present

## 2017-01-12 ENCOUNTER — Encounter: Payer: Self-pay | Admitting: Adult Health

## 2017-01-12 ENCOUNTER — Non-Acute Institutional Stay (SKILLED_NURSING_FACILITY): Payer: Medicare Other | Admitting: Adult Health

## 2017-01-12 DIAGNOSIS — G309 Alzheimer's disease, unspecified: Secondary | ICD-10-CM

## 2017-01-12 DIAGNOSIS — F0150 Vascular dementia without behavioral disturbance: Secondary | ICD-10-CM | POA: Diagnosis not present

## 2017-01-12 DIAGNOSIS — F5101 Primary insomnia: Secondary | ICD-10-CM

## 2017-01-12 DIAGNOSIS — R269 Unspecified abnormalities of gait and mobility: Secondary | ICD-10-CM

## 2017-01-12 DIAGNOSIS — F0280 Dementia in other diseases classified elsewhere without behavioral disturbance: Secondary | ICD-10-CM | POA: Diagnosis not present

## 2017-01-12 DIAGNOSIS — M159 Polyosteoarthritis, unspecified: Secondary | ICD-10-CM | POA: Diagnosis not present

## 2017-01-12 DIAGNOSIS — I10 Essential (primary) hypertension: Secondary | ICD-10-CM | POA: Diagnosis not present

## 2017-01-12 NOTE — Progress Notes (Signed)
Location:  Medical illustrator of Service:  SNF (31) Provider:   Peggye Ley, ANP [facility] [phone]  [name], DO  Patient Care Team: [name], DO as PCP - General (Geriatric Medicine) [facility] [name], [name], NP as Nurse Practitioner (Nurse Practitioner) Marvel Plan, MD as Consulting Physician (Neurology)  Extended Emergency Contact Information Primary Emergency Contact: [name] [place] [address] [address] [place] Home Phone: [phone] Mobile Phone: [phone] Relation: Daughter Secondary Emergency Contact: Reed,Tilden  [place] Home Phone: [phone] Mobile Phone: [phone] Relation: Relative  Code Status:  DNR Goals of care: Advanced Directive information Advanced Directives 12/11/2016  Does Patient Have a Medical Advance Directive? Yes  Type of Advance Directive Out of facility DNR (pink MOST or yellow form);Healthcare Power of Attorney  Does patient want to make changes to medical advance directive? -  Copy of Healthcare Power of Attorney in Chart? Yes  Pre-existing out of facility DNR order (yellow form or pink MOST form) Yellow form placed in chart (order not valid for inpatient use)     Chief Complaint  Patient presents with  . Medical Management of Chronic Issues    HPI:  Pt is a 83 y.o. female seen today for medical management of chronic diseases. She resides in skilled care.  She has a hx of CVA with left leg weakness and has become progressively weaker over time.    Mixed dementia: 18/30 MMSE in April of 2018. She remains able to communicate her needs and feed herself but needs assistance with mobility and executive function  HTN: BP 141/85, goal <150/90, currently on norvasc 5 gm (reduced last month)  Insomnia: takes Lunesta at bed time and sleep fairly well but has times where she wakes up due to neck and back pain  OA: chronic  pain to the neck, low back, and knees, uses scheduled tylenol and norco. Reports that her pain is present but controlled with her current regimen  She now refuses to do her walking program and spends most of the day in the recliner.   Past Medical History:  Diagnosis Date  . Abnormality of gait   . Allergic rhinitis   . Anxiety   . Arthritis   . Carotid stenosis, right    asymptomatic  <40%  right  ica  and right eca  . Carpal tunnel syndrome on both sides   . Claudication, intermittent (HCC)   . COPD (chronic obstructive pulmonary disease) (HCC)   . Diverticulosis of colon   . Fracture, calcaneus closed   . History of esophagitis   . History of hypothyroidism   . Hypertension   . Internal hemorrhoids   . Irritable bowel syndrome 03/11/2010  . PAD (peripheral artery disease) (HCC) monitored by dr Rubye Oaks   moderate  . Peripheral vascular disease (HCC)   . Stroke (HCC)   . Unspecified hereditary and idiopathic peripheral neuropathy   . Urge urinary incontinence    Past Surgical History:  Procedure Laterality Date  . CAROTID ENDARTERECTOMY Left 11-18-2007  . CATARACT EXTRACTION W/ INTRAOCULAR LENS IMPLANT Right 11/2012  . COLONOSCOPY  2005   mild diverticulosis  . CYSTOSCOPY WITH INJECTION N/A 09/06/2013   Procedure: CYSTOSCOPY WITH BOTOX  INJECTION;  Surgeon: Martina Sinner, MD;  Location: [facility];  Service: Urology;  Laterality: N/A;  . DILATION AND CURETTAGE OF UTERUS  1973  Allergies  Allergen Reactions  . Tramadol Nausea Only    Outpatient Encounter Prescriptions as of 01/12/2017  Medication Sig  . acetaminophen (TYLENOL) 500 MG tablet Take 500 mg by mouth 2 (two) times daily.  Marland Kitchen alum & mag hydroxide-simeth (MINTOX) 200-200-20 MG/5ML suspension Take 15 mLs by mouth at bedtime. And prn indigestion  . amLODipine (NORVASC) 10 MG tablet Take 10 mg by mouth daily. for blood pressure  . atorvastatin (LIPITOR) 20 MG tablet Take 1 tablet (20 mg  total) by mouth daily at 6 PM.  . clopidogrel (PLAVIX) 75 MG tablet Take 75 mg by mouth daily.   . eszopiclone (LUNESTA) 1 MG TABS tablet Take 1 mg by mouth at bedtime. Take immediately before bedtime  . HYDROcodone-acetaminophen (NORCO) 7.5-325 MG tablet Take 1 tablet by mouth every 6 (six) hours as needed for moderate pain. (control) (Patient taking differently: Take 1 tablet by mouth every 6 (six) hours as needed for moderate pain. (control) 1 tab qhs and q 6 hr sprn)  . Menthol, Topical Analgesic, (BIOFREEZE EX) Apply 1 application topically as needed. To neck and knees and twice daily as needed to any affected joint.   . mirabegron ER (MYRBETRIQ) 50 MG TB24 tablet Take 50 mg by mouth at bedtime.   . pantoprazole (PROTONIX) 40 MG tablet Take 40 mg by mouth daily.  Bertram Gala Glycol-Propyl Glycol (SYSTANE) 0.4-0.3 % GEL ophthalmic gel Place 1 application into both eyes 4 (four) times daily.  . polyethylene glycol powder (GLYCOLAX/MIRALAX) powder Take 17 g by mouth every other day. Mix in 8 oz liquid and drink  . senna-docusate (SENNA S) 8.6-50 MG tablet Take 2 tablets by mouth 2 (two) times daily as needed for mild constipation.   . SUPER B COMPLEX/C PO Take 2 tablets by mouth daily.   No facility-administered encounter medications on file as of 01/12/2017.     Review of Systems  Constitutional: Negative for activity change, appetite change, chills, diaphoresis, fatigue, fever and unexpected weight change.  HENT: Negative for congestion.   Respiratory: Negative for cough, shortness of breath and wheezing.   Cardiovascular: Positive for leg swelling. Negative for chest pain and palpitations.  Gastrointestinal: Negative for abdominal distention, abdominal pain, constipation and diarrhea.  Genitourinary: Negative for difficulty urinating and dysuria.  Musculoskeletal: Positive for arthralgias, back pain and gait problem. Negative for joint swelling and myalgias.  Skin: Negative for wound.    Neurological: Positive for weakness. Negative for dizziness, tremors, seizures, syncope, facial asymmetry, speech difficulty, light-headedness, numbness and headaches.  Psychiatric/Behavioral: Positive for confusion. Negative for agitation and behavioral problems.    Immunization History  Administered Date(s) Administered  . DTaP 06/03/2016  . Influenza Inj Mdck Quad Pf 03/13/2016  . Influenza-Unspecified 03/09/2013, 03/01/2014, 03/08/2015  . PPD Test 12/04/2011  . Pneumococcal Conjugate-13 09/12/2014  . Pneumococcal Polysaccharide-23 09/12/2015  . Td 06/03/2016   Pertinent  Health Maintenance Due  Topic Date Due  . INFLUENZA VACCINE  12/24/2016  . PNA vac Low Risk Adult  Completed   Fall Risk  10/21/2016 09/10/2016 02/18/2016 09/12/2015 03/21/2015  Falls in the past year? Yes Yes No No No  Number falls in past yr: 1 1 - - -  Injury with Fall? No No - - -  Comment - - - - -  Risk for fall due to : - History of fall(s) - - -  Follow up - Education provided - - -   Functional Status Survey:    Vitals:   01/12/17 1634  BP: (!) 141/85  Pulse: 88  Resp: (!) 21  Temp: (!) 97.2 F (36.2 C)  SpO2: 94%  Weight: 155 lb 3.2 oz (70.4 kg)   Body mass index is 25.83 kg/m. Physical Exam  Constitutional: She is oriented to person, place, and time. No distress.  HENT:  Head: Normocephalic and atraumatic.  Neck: No JVD present.  Cardiovascular: Normal rate and regular rhythm.   No murmur heard. Pulmonary/Chest: Effort normal and breath sounds normal. No respiratory distress. She has no wheezes.  Abdominal: Soft. Bowel sounds are normal.  Musculoskeletal: She exhibits no edema or tenderness.  Kyphosis  Neurological: She is alert and oriented to person, place, and time.  Skin: Skin is warm and dry. She is not diaphoretic.  Psychiatric: She has a normal mood and affect.    Labs reviewed:  Recent Labs  04/14/16 11/17/16  NA 140 139  K 4.3 4.0  BUN 25* 27*  CREATININE 0.6 0.8     Recent Labs  04/14/16  AST 17  ALT 17  ALKPHOS 89    Recent Labs  04/14/16  WBC 6.5  HGB 13.0  HCT 40  PLT 181   Lab Results  Component Value Date   TSH 3.16 09/20/2015   Lab Results  Component Value Date   HGBA1C 6.0 (H) 10/14/2015   Lab Results  Component Value Date   CHOL 166 11/17/2016   HDL 106 (A) 11/17/2016   LDLCALC 53 11/17/2016   TRIG 36 (A) 11/17/2016   CHOLHDL 3.2 10/14/2015    Significant Diagnostic Results in last 30 days:  No results found.  Assessment/Plan  1. Essential hypertension Controlled Continue Norvsasc 5 mg qd  2. Mixed Alzheimer's and vascular dementia Progressive functional and cognitive decline Dependent on staff for most tasks and in skilled care Would likely not benefit from namenda  3. Primary insomnia Controlled Continue Lunesta  1 mg qhs  4. Generalized osteoarthritis of multiple sites Controlled Continue scheduled tylenol and Norco 1 tab qhs and q 6 hrs prn  5. Gait abnormality No longer wants to complete her walking program Has gait issues due to previous CVA, muscles weakness, and dementia    Family/ staff Communication: discussed with resident  Labs/tests ordered:  NA

## 2017-01-27 ENCOUNTER — Non-Acute Institutional Stay (SKILLED_NURSING_FACILITY): Payer: Medicare Other | Admitting: Internal Medicine

## 2017-01-27 ENCOUNTER — Encounter: Payer: Self-pay | Admitting: Internal Medicine

## 2017-01-27 DIAGNOSIS — F0280 Dementia in other diseases classified elsewhere without behavioral disturbance: Secondary | ICD-10-CM | POA: Diagnosis not present

## 2017-01-27 DIAGNOSIS — F0150 Vascular dementia without behavioral disturbance: Secondary | ICD-10-CM | POA: Diagnosis not present

## 2017-01-27 DIAGNOSIS — K5903 Drug induced constipation: Secondary | ICD-10-CM | POA: Diagnosis not present

## 2017-01-27 DIAGNOSIS — I10 Essential (primary) hypertension: Secondary | ICD-10-CM

## 2017-01-27 DIAGNOSIS — I679 Cerebrovascular disease, unspecified: Secondary | ICD-10-CM

## 2017-01-27 DIAGNOSIS — G309 Alzheimer's disease, unspecified: Secondary | ICD-10-CM

## 2017-01-27 DIAGNOSIS — M159 Polyosteoarthritis, unspecified: Secondary | ICD-10-CM | POA: Diagnosis not present

## 2017-01-27 DIAGNOSIS — N3281 Overactive bladder: Secondary | ICD-10-CM | POA: Diagnosis not present

## 2017-01-27 NOTE — Progress Notes (Addendum)
Patient ID: Tara Walters, female   DOB: 01/05/1922, 83 y.o.   MRN: [id number]  Location:  [facility] Room Number: 120 Place of Service:  SNF ([phone]) Provider:   [name], DO  Patient Care Team: [name], DO as PCP - General (Geriatric Medicine) [facility] Merrilee Jansky, NP as Nurse Practitioner (Nurse Practitioner) Marvel Plan, MD as Consulting Physician (Neurology)  Extended Emergency Contact Information Primary Emergency Contact: [name] [place] [address] [address] [place] Home Phone: [phone] Mobile Phone: [phone] Relation: Daughter Secondary Emergency Contact: Adrine Hayworth,[place]  [place] Home Phone: [phone] Mobile Phone: [phone] Relation: Relative  Code Status:  DNR Goals of care: Advanced Directive information Advanced Directives 01/27/2017  Does Patient Have a Medical Advance Directive? -  Type of Advance Directive Out of facility DNR (pink MOST or yellow form);Living will;Healthcare Power of Attorney  Does patient want to make changes to medical advance directive? -  Copy of Healthcare Power of Attorney in Chart? Yes  Pre-existing out of facility DNR order (yellow form or pink MOST form) Yellow form placed in chart (order not valid for inpatient use)   Chief Complaint  Patient presents with  . Medical Management of Chronic Issues    routine visit    HPI:  Pt is a 83 y.o. female seen today for medical management of chronic diseases.    OA:  Continues to c/o pain in her back, knees, neck.  Uses heating pad for her back.  Pain controlled adequately with her tylenol and norco.    HTN:  bp satisfactory for her fall risk in the 140s systolic.    Constipation:  Made worse by norco, but pain not controlled w/o it.  Using senokot s 2 bid prn, miralax qod.    Cerebrovascular disease:  Remains on statin, plavix after prior strokes and due to vascular  component of dementia  For some reason hypothyroidism was on her problem list.  She has no history of this and is not on levothyroxine.    She thinks her overactive bladder is improved with myrbetric therapy so continues on this.  Past Medical History:  Diagnosis Date  . Abnormality of gait   . Allergic rhinitis   . Anxiety   . Arthritis   . Carotid stenosis, right    asymptomatic  <40%  right  ica  and right eca  . Carpal tunnel syndrome on both sides   . Claudication, intermittent (HCC)   . COPD (chronic obstructive pulmonary disease) (HCC)   . Diverticulosis of colon   . Fracture, calcaneus closed   . History of esophagitis   . History of hypothyroidism   . Hypertension   . Internal hemorrhoids   . Irritable bowel syndrome 03/11/2010  . PAD (peripheral artery disease) (HCC) monitored by dr Rubye Oaks   moderate  . Peripheral vascular disease (HCC)   . Stroke (HCC)   . Unspecified hereditary and idiopathic peripheral neuropathy   . Urge urinary incontinence    Past Surgical History:  Procedure Laterality Date  . CAROTID ENDARTERECTOMY Left 11-18-2007  . CATARACT EXTRACTION W/ INTRAOCULAR LENS IMPLANT Right 11/2012  . COLONOSCOPY  2005   mild diverticulosis  . CYSTOSCOPY WITH INJECTION N/A 09/06/2013   Procedure: CYSTOSCOPY WITH BOTOX  INJECTION;  Surgeon: Martina Sinner, MD;  Location: [facility];  Service: Urology;  Laterality: N/A;  . DILATION AND CURETTAGE OF  UTERUS  1973    Allergies  Allergen Reactions  . Tramadol Nausea Only    Outpatient Encounter Prescriptions as of 01/27/2017  Medication Sig  . acetaminophen (TYLENOL) 500 MG tablet Take 500 mg by mouth 2 (two) times daily.  Marland Kitchen. alum & mag hydroxide-simeth (MINTOX) 200-200-20 MG/5ML suspension Take 15 mLs by mouth at bedtime. And prn indigestion  . amLODipine (NORVASC) 5 MG tablet Take 5 mg by mouth daily.  Marland Kitchen. atorvastatin (LIPITOR) 20 MG tablet Take 1 tablet (20 mg total) by mouth daily at 6  PM.  . clopidogrel (PLAVIX) 75 MG tablet Take 75 mg by mouth daily.   . eszopiclone (LUNESTA) 1 MG TABS tablet Take 1 mg by mouth at bedtime. Take immediately before bedtime  . HYDROcodone-acetaminophen (NORCO) 7.5-325 MG tablet Take 1 tablet by mouth every 6 (six) hours as needed for moderate pain. 1 tablet at bedtime as needed for low back pain  . Menthol (COUGH DROPS) 10 MG LOZG Use as directed 1 lozenge in the mouth or throat as needed.  . Menthol, Topical Analgesic, (BIOFREEZE EX) Apply 1 application topically as needed. To neck and knees and twice daily as needed to any affected joint.   . mirabegron ER (MYRBETRIQ) 50 MG TB24 tablet Take 50 mg by mouth at bedtime.   . pantoprazole (PROTONIX) 40 MG tablet Take 40 mg by mouth daily.  Bertram Gala. Polyethyl Glycol-Propyl Glycol (SYSTANE) 0.4-0.3 % GEL ophthalmic gel Place 1 application into both eyes 4 (four) times daily.  . polyethylene glycol powder (GLYCOLAX/MIRALAX) powder Take 17 g by mouth every other day. Mix in 8 oz liquid and drink  . senna-docusate (SENNA S) 8.6-50 MG tablet Take 2 tablets by mouth 2 (two) times daily as needed for mild constipation.   . SUPER B COMPLEX/C PO Take 2 tablets by mouth daily.  . [DISCONTINUED] HYDROcodone-acetaminophen (NORCO) 7.5-325 MG tablet Take 1 tablet by mouth every 6 (six) hours as needed for moderate pain. (control) (Patient taking differently: Take 1 tablet by mouth every 6 (six) hours as needed for moderate pain. (control) 1 tab qhs and q 6 hr sprn)   No facility-administered encounter medications on file as of 01/27/2017.     Review of Systems  Constitutional: Negative for activity change, appetite change, fatigue and fever.  HENT: Negative for congestion.   Eyes: Negative for visual disturbance.  Respiratory: Negative for apnea and shortness of breath.   Cardiovascular: Negative for chest pain, palpitations and leg swelling.  Gastrointestinal: Positive for constipation.  Genitourinary:       OAB    Musculoskeletal: Positive for arthralgias, back pain and gait problem.  Skin: Negative for color change.  Neurological: Negative for dizziness and weakness.  Psychiatric/Behavioral: Positive for confusion.    Immunization History  Administered Date(s) Administered  . DTaP 06/03/2016  . Influenza Inj Mdck Quad Pf 03/13/2016  . Influenza-Unspecified 03/09/2013, 03/01/2014, 03/08/2015  . PPD Test 12/04/2011  . Pneumococcal Conjugate-13 09/12/2014  . Pneumococcal Polysaccharide-23 09/12/2015  . Td 06/03/2016   Pertinent  Health Maintenance Due  Topic Date Due  . INFLUENZA VACCINE  12/24/2016  . PNA vac Low Risk Adult  Completed   Fall Risk  10/21/2016 09/10/2016 02/18/2016 09/12/2015 03/21/2015  Falls in the past year? Yes Yes No No No  Number falls in past yr: 1 1 - - -  Injury with Fall? No No - - -  Comment - - - - -  Risk for fall due to : - History of fall(s) - - -  Follow up - Education provided - - -   Functional Status Survey:    Vitals:   01/27/17 1412  BP: (!) 144/63  Pulse: 63  Resp: 20  Temp: (!) 97.5 F (36.4 C)  TempSrc: Oral  SpO2: 94%  Weight: 172 lb (78 kg)   Body mass index is 28.62 kg/m. Physical Exam  Constitutional: She appears well-developed and well-nourished. No distress.  Cardiovascular: Normal rate, regular rhythm, normal heart sounds and intact distal pulses.   Pulmonary/Chest: Effort normal and breath sounds normal. No respiratory distress.  Abdominal: Soft. Bowel sounds are normal.  Musculoskeletal: Normal range of motion.  Neurological: She is alert.  Skin: Skin is warm and dry.  Psychiatric: She has a normal mood and affect.    Labs reviewed:  Recent Labs  04/14/16 11/17/16  NA 140 139  K 4.3 4.0  BUN 25* 27*  CREATININE 0.6 0.8    Recent Labs  04/14/16  AST 17  ALT 17  ALKPHOS 89    Recent Labs  04/14/16  WBC 6.5  HGB 13.0  HCT 40  PLT 181   Lab Results  Component Value Date   TSH 3.16 09/20/2015   Lab  Results  Component Value Date   HGBA1C 6.0 (H) 10/14/2015   Lab Results  Component Value Date   CHOL 166 11/17/2016   HDL 106 (A) 11/17/2016   LDLCALC 53 11/17/2016   TRIG 36 (A) 11/17/2016   CHOLHDL 3.2 10/14/2015    Assessment/Plan 1. Generalized osteoarthritis of multiple sites -cont tylenol and norco for pain, heat for her back -for her neck, OT referral for therapeutic interventions and mobic 7.5mg  daily for one week  2. Essential hypertension -cont current regimen and monitor  3. Drug-induced constipation -stable with current bowel regimen--cont same  4. Cerebrovascular disease -cont secondary prevention with bp <150/90, plavix, statin  5. Mixed Alzheimer's and vascular dementia -progressing gradually, had a significant decline with stroke that occurred soon before her move to SNF from [place]  6. OAB (overactive bladder) -cont myrbetriq due to some improvement in frequency  Family/ staff Communication: discussed with snf nurse  Labs/tests ordered:  No new   Magaby Rumberger L. Coree Brame, D.O. Geriatrics [facility] [address], [place] [address] Cell Phone (Mon-Fri 8am-5pm):  [phone] On Call:  [phone] & follow prompts after 5pm & weekends Office Phone:  [phone] Office Fax:  [phone]

## 2017-02-23 ENCOUNTER — Non-Acute Institutional Stay (SKILLED_NURSING_FACILITY): Payer: Medicare Other | Admitting: Adult Health

## 2017-02-23 DIAGNOSIS — G309 Alzheimer's disease, unspecified: Secondary | ICD-10-CM

## 2017-02-23 DIAGNOSIS — F0150 Vascular dementia without behavioral disturbance: Secondary | ICD-10-CM

## 2017-02-23 DIAGNOSIS — M25511 Pain in right shoulder: Secondary | ICD-10-CM

## 2017-02-23 DIAGNOSIS — I10 Essential (primary) hypertension: Secondary | ICD-10-CM | POA: Diagnosis not present

## 2017-02-23 DIAGNOSIS — I739 Peripheral vascular disease, unspecified: Secondary | ICD-10-CM | POA: Diagnosis not present

## 2017-02-23 DIAGNOSIS — I679 Cerebrovascular disease, unspecified: Secondary | ICD-10-CM | POA: Diagnosis not present

## 2017-02-23 DIAGNOSIS — F0280 Dementia in other diseases classified elsewhere without behavioral disturbance: Secondary | ICD-10-CM | POA: Diagnosis not present

## 2017-02-23 NOTE — Progress Notes (Signed)
Location:  Medical illustrator of Service:  SNF (31) Provider:   Peggye Ley, ANP [facility] [phone]  [name], DO  Patient Care Team: [name], DO as PCP - General (Geriatric Medicine) Community, [facility] [name], [name], NP as Nurse Practitioner (Nurse Practitioner) Marvel Plan, MD as Consulting Physician (Neurology)  Extended Emergency Contact Information Primary Emergency Contact: [name] [place] [address] [address] Home Phone: [phone] Mobile Phone: [phone] Relation: Daughter Secondary Emergency Contact: Reed,T[place]en  [place] Home Phone: [phone] Mobile Phone: [phone] Relation: Relative  Code Status:  DNR Goals of care: Advanced Directive information Advanced Directives 02/27/2017  Does Patient Have a Medical Advance Directive? Yes  Type of Advance Directive Out of facility DNR (pink MOST or yellow form);Living will;Healthcare Power of Attorney  Does patient want to make changes to medical advance directive? -  Copy of Healthcare Power of Attorney in Chart? Yes  Pre-existing out of facility DNR order (yellow form or pink MOST form) Yellow form placed in chart (order not valid for inpatient use)     Chief Complaint  Patient presents with  . Medical Management of Chronic Issues    HPI:  Pt is a 83 y.o. female seen today for medical management of chronic diseases. She resides in skilled care.  She has a hx of CVA with left leg weakness and has become progressively weaker over time.    Mixed dementia: 18/30 MMSE in April of 2018. No longer ambulating due to weakness, stands and pivots to the [facility]  HTN: BP in the 140-150's range. No edema, sob, cp.   Reports right shoulder pain. 5/10.  New with decreased rom, no report of injury.  Cerebrovascular dz: hx of CVA and carotid stenosis.   LDL 53, on plavix  PVD: no pain in her legs when  standing.  No ulcerations ABI: 08/2016 Right: 0.69 (0.61, 08-15-15); waveforms: PT: biphasic; DP: monophasic; TBI: 0.49 Left: 0.63 (0.69, 08-15-15); waveforms: biphasic; TBI: 0.43 ABI's remain stable with moderate arterial occlusive disease bilaterally, bi and monophasic waveforms.  Past Medical History:  Diagnosis Date  . Abnormality of gait   . Allergic rhinitis   . Anxiety   . Arthritis   . Carotid stenosis, right    asymptomatic  <40%  right  ica  and right eca  . Carpal tunnel syndrome on both sides   . Claudication, intermittent (HCC)   . COPD (chronic obstructive pulmonary disease) (HCC)   . Diverticulosis of colon   . Fracture, calcaneus closed   . History of esophagitis   . History of hypothyroidism   . Hypertension   . Internal hemorrhoids   . Irritable bowel syndrome 03/11/2010  . PAD (peripheral artery disease) (HCC) monitored by dr Rubye Oaks   moderate  . Peripheral vascular disease (HCC)   . Stroke (HCC)   . Unspecified hereditary and idiopathic peripheral neuropathy   . Urge urinary incontinence    Past Surgical History:  Procedure Laterality Date  . CAROTID ENDARTERECTOMY Left 11-18-2007  . CATARACT EXTRACTION W/ INTRAOCULAR LENS IMPLANT Right 11/2012  . COLONOSCOPY  2005   mild diverticulosis  . CYSTOSCOPY WITH INJECTION N/A 09/06/2013   Procedure: CYSTOSCOPY WITH BOTOX  INJECTION;  Surgeon: Martina Sinner, MD;  Location: [facility];  Service: Urology;  Laterality: N/A;  . DILATION AND CURETTAGE OF UTERUS  1973    Allergies  Allergen Reactions  . Tramadol Nausea Only    Outpatient Encounter Prescriptions as of 02/23/2017  Medication Sig  . acetaminophen (TYLENOL) 500 MG tablet Take 500 mg by mouth 2 (two) times daily.  Marland Kitchen alum & mag hydroxide-simeth (MINTOX) 200-200-20 MG/5ML suspension Take 15 mLs by mouth at bedtime. And prn indigestion  . amLODipine (NORVASC) 5 MG tablet Take 5 mg by mouth daily.  Marland Kitchen atorvastatin (LIPITOR) 20 MG  tablet Take 1 tablet (20 mg total) by mouth daily at 6 PM.  . clopidogrel (PLAVIX) 75 MG tablet Take 75 mg by mouth daily.   . eszopiclone (LUNESTA) 1 MG TABS tablet Take 1 mg by mouth at bedtime. Take immediately before bedtime  . HYDROcodone-acetaminophen (NORCO) 7.5-325 MG tablet Take 1 tablet by mouth every 6 (six) hours as needed for moderate pain. 1 tablet at bedtime as needed for low back pain  . Menthol (COUGH DROPS) 10 MG LOZG Use as directed 1 lozenge in the mouth or throat as needed.  . Menthol, Topical Analgesic, (BIOFREEZE EX) Apply 1 application topically as needed. To neck and knees and twice daily as needed to any affected joint.   . mirabegron ER (MYRBETRIQ) 50 MG TB24 tablet Take 50 mg by mouth at bedtime.   . pantoprazole (PROTONIX) 40 MG tablet Take 40 mg by mouth daily.  Bertram Gala Glycol-Propyl Glycol (SYSTANE) 0.4-0.3 % GEL ophthalmic gel Place 1 application into both eyes 4 (four) times daily.  . polyethylene glycol powder (GLYCOLAX/MIRALAX) powder Take 17 g by mouth every other day. Mix in 8 oz liquid and drink  . senna-docusate (SENNA S) 8.6-50 MG tablet Take 2 tablets by mouth 2 (two) times daily as needed for mild constipation.   . SUPER B COMPLEX/C PO Take 2 tablets by mouth daily.   No facility-administered encounter medications on file as of 02/23/2017.     Review of Systems  Constitutional: Negative for chills, diaphoresis, fever, malaise/fatigue and weight loss.  Respiratory: Negative for cough, hemoptysis, sputum production and wheezing.   Cardiovascular: Negative for chest pain, leg swelling and PND.  Gastrointestinal: Negative for abdominal pain, constipation, diarrhea, nausea and vomiting.  Genitourinary: Negative for dysuria and urgency.  Musculoskeletal: Positive for back pain, joint pain and neck pain. Negative for falls and myalgias.  Skin: Negative for itching and rash.  Neurological: Positive for focal weakness (chronic). Negative for dizziness,  tingling, tremors, sensory change, speech change, seizures, loss of consciousness, weakness and headaches.  Psychiatric/Behavioral: Positive for memory loss. Negative for depression and hallucinations. The patient is not nervous/anxious and does not have insomnia.      Immunization History  Administered Date(s) Administered  . DTaP 06/03/2016  . Influenza Inj Mdck Quad Pf 03/13/2016  . Influenza-Unspecified 03/09/2013, 03/01/2014, 03/08/2015  . PPD Test 12/04/2011  . Pneumococcal Conjugate-13 09/12/2014  . Pneumococcal Polysaccharide-23 09/12/2015  . Td 06/03/2016   Pertinent  Health Maintenance Due  Topic Date Due  . INFLUENZA VACCINE  12/24/2016  . PNA vac Low Risk Adult  Completed   Fall Risk  10/21/2016 09/10/2016 02/18/2016 09/12/2015 03/21/2015  Falls in the past year? Yes Yes No No No  Number falls in past yr: 1 1 - - -  Injury with Fall? No No - - -  Comment - - - - -  Risk for fall due to : - History of fall(s) - - -  Follow up - Education provided - - -   Functional Status Survey:    Vitals:   02/27/17  1234  BP: (!) 151/76  Pulse: 66  Resp: 20  Temp: (!) 97.4 F (36.3 C)  SpO2: 96%  Weight: 174 lb 3.2 oz (79 kg)   Body mass index is 28.99 kg/m.  Physical Exam  Constitutional: She is oriented to person, place, and time. No distress.  HENT:  Head: Normocephalic and atraumatic.  Right Ear: External ear normal.  Left Ear: External ear normal.  Mouth/Throat: No oropharyngeal exudate.  Eyes: Pupils are equal, round, and reactive to light. Conjunctivae and EOM are normal.  Neck: No JVD present. No thyromegaly present.  Cardiovascular: Normal rate and normal heart sounds.   No murmur heard. Pulmonary/Chest: Effort normal and breath sounds normal. No stridor. No respiratory distress.  Abdominal: Soft. Bowel sounds are normal.  Musculoskeletal: Normal range of motion. She exhibits no edema, tenderness or deformity.  Pos empty can test, decreased ROM to right  shoulder  Lymphadenopathy:    She has no cervical adenopathy.  Neurological: She is alert and oriented to person, place, and time. No cranial nerve deficit.  Skin: Skin is warm and dry. She is not diaphoretic.  Psychiatric: She has a normal mood and affect.  Nursing note and vitals reviewed.   Labs reviewed:  Recent Labs  04/14/16 11/17/16  NA 140 139  K 4.3 4.0  BUN 25* 27*  CREATININE 0.6 0.8    Recent Labs  04/14/16  AST 17  ALT 17  ALKPHOS 89    Recent Labs  04/14/16  WBC 6.5  HGB 13.0  HCT 40  PLT 181   Lab Results  Component Value Date   TSH 3.16 09/20/2015   Lab Results  Component Value Date   HGBA1C 6.0 (H) 10/14/2015   Lab Results  Component Value Date   CHOL 166 11/17/2016   HDL 106 (A) 11/17/2016   LDLCALC 53 11/17/2016   TRIG 36 (A) 11/17/2016   CHOLHDL 3.2 10/14/2015    Significant Diagnostic Results in last 30 days:  No results found.  Assessment/Plan  1. Mixed Alzheimer's and vascular dementia Progressive cognitive and functional losses Would not benefit form memory meds  2. Cerebrovascular disease LDL at goal BP should be <150/90 for age and fall risk Continue plavix  3. PVD (peripheral vascular disease) with claudication (HCC) No pain or ulcerations Continue to monitor Sees vascular  4. Essential hypertension Controlled  5. Acute pain of right shoulder Heat to right shoulder qhs x 1 week then prn She describes this as acute but I believe this has been going on quite some time. She has multiple joints with OA.  Exam shows probable rotator cuff pathology, but she is not a good surgical candidate. We will treat her pain and monitor for improvement.     Family/ staff Communication: discussed with resident  Labs/tests ordered:  NA

## 2017-02-27 ENCOUNTER — Encounter: Payer: Self-pay | Admitting: Adult Health

## 2017-03-19 ENCOUNTER — Other Ambulatory Visit: Payer: Self-pay | Admitting: *Deleted

## 2017-03-19 DIAGNOSIS — Z23 Encounter for immunization: Secondary | ICD-10-CM | POA: Diagnosis not present

## 2017-03-19 MED ORDER — HYDROCODONE-ACETAMINOPHEN 7.5-325 MG PO TABS: ORAL_TABLET | ORAL | 0 refills | Status: DC

## 2017-03-19 NOTE — Telephone Encounter (Signed)
[facility] [phone] Fax: [phone]  Received fax from [facility] stating Resident has 2 Hydrocodone left which should get her through Thursday evening when we can have more delivered.   Rx Printed.

## 2017-03-26 ENCOUNTER — Non-Acute Institutional Stay (SKILLED_NURSING_FACILITY): Payer: Medicare Other | Admitting: Adult Health

## 2017-03-26 DIAGNOSIS — F0280 Dementia in other diseases classified elsewhere without behavioral disturbance: Secondary | ICD-10-CM

## 2017-03-26 DIAGNOSIS — K219 Gastro-esophageal reflux disease without esophagitis: Secondary | ICD-10-CM

## 2017-03-26 DIAGNOSIS — K5903 Drug induced constipation: Secondary | ICD-10-CM

## 2017-03-26 DIAGNOSIS — N3281 Overactive bladder: Secondary | ICD-10-CM

## 2017-03-26 DIAGNOSIS — F0150 Vascular dementia without behavioral disturbance: Secondary | ICD-10-CM

## 2017-03-26 DIAGNOSIS — F5101 Primary insomnia: Secondary | ICD-10-CM | POA: Diagnosis not present

## 2017-03-26 DIAGNOSIS — G309 Alzheimer's disease, unspecified: Secondary | ICD-10-CM | POA: Diagnosis not present

## 2017-03-26 DIAGNOSIS — I10 Essential (primary) hypertension: Secondary | ICD-10-CM

## 2017-03-26 NOTE — Progress Notes (Signed)
Location:  Medical illustrator of Service:  SNF (31) Provider:   Peggye Ley, ANP [facility] [phone]  [name], DO  Patient Care Team: [name], DO as PCP - General (Geriatric Medicine) Community, [facility] [name], [name], NP as Nurse Practitioner (Nurse Practitioner) Marvel Plan, MD as Consulting Physician (Neurology)  Extended Emergency Contact Information Primary Emergency Contact: [name] [place] [address] [address] Home Phone: [phone] Mobile Phone: [phone] Relation: Daughter Secondary Emergency Contact: Reed,T[place]en  [place] Home Phone: [phone] Mobile Phone: [phone] Relation: Relative  Code Status:  DNR Goals of care: Advanced Directive information Advanced Directives 02/27/2017  Does Patient Have a Medical Advance Directive? Yes  Type of Advance Directive Out of facility DNR (pink MOST or yellow form);Living will;Healthcare Power of Attorney  Does patient want to make changes to medical advance directive? -  Copy of Healthcare Power of Attorney in Chart? Yes  Pre-existing out of facility DNR order (yellow form or pink MOST form) Yellow form placed in chart (order not valid for inpatient use)     Chief Complaint  Patient presents with  . Medical Management of Chronic Issues    HPI:  Pt is a 83 y.o. female seen today for medical management of chronic diseases. She resides in skilled care.  She has a hx of CVA with left leg weakness and has become progressively weaker over time.    Insomnia: has periods of not sleeping well. More trouble staying asleep than falling asleep  Constipation: take miralax qod and still feels constipated and has required prn MOM She feels if we increased the miralax it will be too strong  GERD: has periods of reflux with heavy meals, takes protonix  OAB: long term issue but she feels that the medication is  no longer working and she wonders if she still needs it. She urinates twice per night. She does not have bladder spasms or pain.   HTN: BP controlled with norvasc  Memory loss: remains the same over the past few months, MMSE 18/30.  Requires skilled care due to mobility issues s/p CVA  Past Medical History:  Diagnosis Date  . Abnormality of gait   . Allergic rhinitis   . Anxiety   . Arthritis   . Carotid stenosis, right    asymptomatic  <40%  right  ica  and right eca  . Carpal tunnel syndrome on both sides   . Claudication, intermittent (HCC)   . COPD (chronic obstructive pulmonary disease) (HCC)   . Diverticulosis of colon   . Fracture, calcaneus closed   . History of esophagitis   . History of hypothyroidism   . Hypertension   . Internal hemorrhoids   . Irritable bowel syndrome 03/11/2010  . PAD (peripheral artery disease) (HCC) monitored by dr Rubye Oaks   moderate  . Peripheral vascular disease (HCC)   . Stroke (HCC)   . Unspecified hereditary and idiopathic peripheral neuropathy   . Urge urinary incontinence    Past Surgical History:  Procedure Laterality Date  . CAROTID ENDARTERECTOMY Left 11-18-2007  . CATARACT EXTRACTION W/ INTRAOCULAR LENS IMPLANT Right 11/2012  . COLONOSCOPY  2005   mild diverticulosis  . DILATION AND CURETTAGE OF UTERUS  1973    Allergies  Allergen Reactions  . Tramadol Nausea Only    Outpatient Encounter Medications as of 03/26/2017  Medication Sig  . acetaminophen (TYLENOL) 500  MG tablet Take 500 mg by mouth 2 (two) times daily.  Marland Kitchen. amLODipine (NORVASC) 5 MG tablet Take 5 mg by mouth daily.  Marland Kitchen. atorvastatin (LIPITOR) 20 MG tablet Take 1 tablet (20 mg total) by mouth daily at 6 PM.  . clopidogrel (PLAVIX) 75 MG tablet Take 75 mg by mouth daily.   . eszopiclone (LUNESTA) 1 MG TABS tablet Take 1 mg by mouth at bedtime. Take immediately before bedtime  . HYDROcodone-acetaminophen (NORCO) 7.5-325 MG tablet Take one tablet by mouth every 6 hours  as needed for moderate pain  . Menthol (COUGH DROPS) 10 MG LOZG Use as directed 1 lozenge in the mouth or throat as needed.  . Menthol, Topical Analgesic, (BIOFREEZE EX) Apply 1 application topically as needed. To neck and knees and twice daily as needed to any affected joint.   . mirabegron ER (MYRBETRIQ) 50 MG TB24 tablet Take 50 mg by mouth at bedtime.   . pantoprazole (PROTONIX) 40 MG tablet Take 40 mg by mouth daily.  Bertram Gala. Polyethyl Glycol-Propyl Glycol (SYSTANE) 0.4-0.3 % GEL ophthalmic gel Place 1 application into both eyes 4 (four) times daily.  . polyethylene glycol powder (GLYCOLAX/MIRALAX) powder Take 17 g by mouth every other day. Mix in 8 oz liquid and drink  . senna-docusate (SENNA S) 8.6-50 MG tablet Take 2 tablets by mouth 2 (two) times daily as needed for mild constipation.   . SUPER B COMPLEX/C PO Take 2 tablets by mouth daily.  Marland Kitchen. alum & mag hydroxide-simeth (MINTOX) 200-200-20 MG/5ML suspension Take 15 mLs by mouth as needed for indigestion or heartburn. And prn indigestion   No facility-administered encounter medications on file as of 03/26/2017.     Review of Systems  Constitutional: Negative for chills, diaphoresis, fever, malaise/fatigue and weight loss.  Eyes: Negative for blurred vision and double vision.  Respiratory: Negative for cough, hemoptysis, sputum production and wheezing.   Cardiovascular: Negative for chest pain, leg swelling and PND.  Gastrointestinal: Positive for constipation and heartburn. Negative for abdominal pain, diarrhea, nausea and vomiting.  Genitourinary: Negative for dysuria, flank pain, frequency, hematuria and urgency.  Musculoskeletal: Positive for back pain, joint pain and neck pain. Negative for falls and myalgias.  Skin: Negative for itching and rash.  Neurological: Negative for seizures, loss of consciousness and weakness.  Psychiatric/Behavioral: Positive for memory loss. Negative for depression. The patient has insomnia.        Immunization History  Administered Date(s) Administered  . DTaP 06/03/2016  . Influenza Inj Mdck Quad Pf 03/13/2016  . Influenza-Unspecified 03/09/2013, 03/01/2014, 03/08/2015  . PPD Test 12/04/2011  . Pneumococcal Conjugate-13 09/12/2014  . Pneumococcal Polysaccharide-23 09/12/2015  . Td 06/03/2016   Pertinent  Health Maintenance Due  Topic Date Due  . INFLUENZA VACCINE  12/24/2016  . PNA vac Low Risk Adult  Completed   Fall Risk  10/21/2016 09/10/2016 02/18/2016 09/12/2015 03/21/2015  Falls in the past year? Yes Yes No No No  Number falls in past yr: 1 1 - - -  Injury with Fall? No No - - -  Comment - - - - -  Risk for fall due to : - History of fall(s) - - -  Follow up - Education provided - - -   Functional Status Survey:    Vitals:   03/30/17 1559  BP: 126/77  Pulse: 63  Resp: 20  Temp: (!) 97 F (36.1 C)  SpO2: 96%  Weight: 177 lb (80.3 kg)   Body mass index is 29.45 kg/m.  Physical Exam  Constitutional: She is oriented to person, place, and time. No distress.  HENT:  Head: Normocephalic and atraumatic.  Right Ear: External ear normal.  Left Ear: External ear normal.  Mouth/Throat: No oropharyngeal exudate.  Eyes: Conjunctivae and EOM are normal. Pupils are equal, round, and reactive to light.  Neck: No JVD present. No thyromegaly present.  Cardiovascular: Normal rate and normal heart sounds.  No murmur heard. Pulmonary/Chest: Effort normal and breath sounds normal. No stridor. No respiratory distress.  Abdominal: Soft. Bowel sounds are normal. She exhibits no distension.  Musculoskeletal: She exhibits no edema or tenderness.  Lymphadenopathy:    She has no cervical adenopathy.  Neurological: She is alert and oriented to person, place, and time. No cranial nerve deficit.  Skin: Skin is warm and dry. She is not diaphoretic.  Psychiatric: She has a normal mood and affect.    Labs reviewed: Recent Labs    04/14/16 11/17/16  NA 140 139  K 4.3  4.0  BUN 25* 27*  CREATININE 0.6 0.8   Recent Labs    04/14/16  AST 17  ALT 17  ALKPHOS 89   Recent Labs    04/14/16  WBC 6.5  HGB 13.0  HCT 40  PLT 181   Lab Results  Component Value Date   TSH 3.16 09/20/2015   Lab Results  Component Value Date   HGBA1C 6.0 (H) 10/14/2015   Lab Results  Component Value Date   CHOL 166 11/17/2016   HDL 106 (A) 11/17/2016   LDLCALC 53 11/17/2016   TRIG 36 (A) 11/17/2016   CHOLHDL 3.2 10/14/2015    Significant Diagnostic Results in last 30 days:  No results found.  Assessment/Plan  Constipation Add colace 100 mg qd. Continue Miralax 17 grams qod.  Insomnia She has periods of difficulty sleeping well, for this reason will continue Lunesta 1 mg qhs per resident request  OAB (overactive bladder) D/c myrbetriq as she does not feel the medication is working any longer. I have asked her to let us know if symptoms reoccur.   HTN (hypertension) Controlled. Continue Norvasc 5 mg qd.   GERD (gastroesophageal reflux disease) Continue Protonix 40 mg qd.  Mixed Alzheimer's and vascular dementia Short term memory loss noted. Continue supportive care, not on meds due to lack of benefit.    Family/ staff Communication: discussed with resident  Labs/tests ordered:  NA

## 2017-03-30 ENCOUNTER — Encounter: Payer: Self-pay | Admitting: Adult Health

## 2017-03-30 NOTE — Assessment & Plan Note (Signed)
Controlled. Continue Norvasc 5 mg qd.  

## 2017-03-30 NOTE — Assessment & Plan Note (Signed)
Short term memory loss noted. Continue supportive care, not on meds due to lack of benefit.

## 2017-03-30 NOTE — Assessment & Plan Note (Signed)
Add colace 100 mg qd. Continue Miralax 17 grams qod.

## 2017-03-30 NOTE — Assessment & Plan Note (Signed)
D/c myrbetriq as she does not feel the medication is working any longer. I have asked her to let us know if symptoms reoccur.

## 2017-03-30 NOTE — Assessment & Plan Note (Signed)
She has periods of difficulty sleeping well, for this reason will continue Lunesta 1 mg qhs per resident request

## 2017-03-30 NOTE — Assessment & Plan Note (Signed)
Continue Protonix 40 mg qd.

## 2017-05-05 ENCOUNTER — Encounter: Payer: Self-pay | Admitting: Internal Medicine

## 2017-05-05 ENCOUNTER — Non-Acute Institutional Stay (SKILLED_NURSING_FACILITY): Payer: Medicare Other | Admitting: Internal Medicine

## 2017-05-05 DIAGNOSIS — M545 Low back pain, unspecified: Secondary | ICD-10-CM

## 2017-05-05 DIAGNOSIS — F0150 Vascular dementia without behavioral disturbance: Secondary | ICD-10-CM

## 2017-05-05 DIAGNOSIS — G8929 Other chronic pain: Secondary | ICD-10-CM | POA: Diagnosis not present

## 2017-05-05 DIAGNOSIS — M159 Polyosteoarthritis, unspecified: Secondary | ICD-10-CM | POA: Diagnosis not present

## 2017-05-05 DIAGNOSIS — K5903 Drug induced constipation: Secondary | ICD-10-CM

## 2017-05-05 DIAGNOSIS — G309 Alzheimer's disease, unspecified: Secondary | ICD-10-CM

## 2017-05-05 DIAGNOSIS — F0280 Dementia in other diseases classified elsewhere without behavioral disturbance: Secondary | ICD-10-CM

## 2017-05-05 DIAGNOSIS — I679 Cerebrovascular disease, unspecified: Secondary | ICD-10-CM | POA: Diagnosis not present

## 2017-05-05 DIAGNOSIS — I739 Peripheral vascular disease, unspecified: Secondary | ICD-10-CM | POA: Diagnosis not present

## 2017-05-05 DIAGNOSIS — M5030 Other cervical disc degeneration, unspecified cervical region: Secondary | ICD-10-CM

## 2017-05-05 NOTE — Progress Notes (Signed)
Patient ID: LIBRADA CASTRONOVO, female   DOB: Mar 12, 1922, 83 y.o.   MRN: [id number]  Location:  [facility] Room Number: 130  Place of Service:  SNF ([id number]) Provider:   [name], DO  Patient Care Team: [name], DO as PCP - General (Geriatric Medicine) Community, Well Merrilee Jansky, NP as Nurse Practitioner (Nurse Practitioner) Marvel Plan, MD as Consulting Physician (Neurology)  Extended Emergency Contact Information Primary Emergency Contact: [name] [place] [address] [address] [place] Home Phone: [phone] Mobile Phone: [phone] Relation: Daughter Secondary Emergency Contact: Byron Peacock,[place]  [place] Home Phone: [phone] Mobile Phone: [phone] Relation: Relative  Code Status:  DNR Goals of care: Advanced Directive information Advanced Directives 05/05/2017  Does Patient Have a Medical Advance Directive? Yes  Type of Estate agent of [place];Living will;Out of facility DNR (pink MOST or yellow form)  Does patient want to make changes to medical advance directive? No - Patient declined  Copy of Healthcare Power of Attorney in Chart? Yes  Pre-existing out of facility DNR order (yellow form or pink MOST form) Yellow form placed in chart (order not valid for inpatient use)     Chief Complaint  Patient presents with  . Medical Management of Chronic Issues    routine visit    HPI:  Pt is a 83 y.o. female seen today for medical management of chronic diseases.  She is in SNF due to physical debility after some strokes and progressing dementia--requires help with adls except feeding herself--is able to participate in her care.    AD/vascular dementia:  Nursing notes pt is getting more forgetful over time.  She had gone to get her nails done when I came to see her at first so I returned later.  Has had prior strokes.  Remains on statin therapy due to  this--is now 83yo.  Also on plavix.    Constipation:  Managed with senna s prn and miralax qod and colace daily.    GERD:  On protonix. Has had issues in the past with dry cough.    Insomnia:  Chronic difficulty with this.  Has been on several different sedative-hypnotics, melatonin, benzos previously and lunesta has actually worked for her and she wants it continued.  Attempts to wean it have not been successful--she reported not sleeping.  Chronic pain due to DDD and facet disease in cspine (per 2012 xrays) and low back pain, peripheral neuropathy:  On tylenol 500mg  po bid and norco 7.5/325mg  po q6h prn moderate to severe pain.  Not overdoing tylenol.  Also uses heating pad in her recliner chair with benefit for her lower back.    Also has generalized OA of knees, hands.  Biofreeze also used as needed for that.  Past Medical History:  Diagnosis Date  . Abnormality of gait   . Allergic rhinitis   . Anxiety   . Arthritis   . Carotid stenosis, right    asymptomatic  <40%  right  ica  and right eca  . Carpal tunnel syndrome on both sides   . Claudication, intermittent (HCC)   . COPD (chronic obstructive pulmonary disease) (HCC)   . Diverticulosis of colon   . Fracture, calcaneus closed   . History of esophagitis   . History of hypothyroidism   . Hypertension   . Internal hemorrhoids   . Irritable bowel syndrome 03/11/2010  . PAD (peripheral artery disease) (  HCC) monitored by dr Rubye Oaksdickerson   moderate  . Peripheral vascular disease (HCC)   . Stroke (HCC)   . Unspecified hereditary and idiopathic peripheral neuropathy   . Urge urinary incontinence    Past Surgical History:  Procedure Laterality Date  . CAROTID ENDARTERECTOMY Left 11-18-2007  . CATARACT EXTRACTION W/ INTRAOCULAR LENS IMPLANT Right 11/2012  . COLONOSCOPY  2005   mild diverticulosis  . CYSTOSCOPY WITH INJECTION N/A 09/06/2013   Procedure: CYSTOSCOPY WITH BOTOX  INJECTION;  Surgeon: Martina SinnerScott A MacDiarmid, MD;  [facility];  Service: Urology;  Laterality: N/A;  . DILATION AND CURETTAGE OF UTERUS  1973    Allergies  Allergen Reactions  . Tramadol Nausea Only    Outpatient Encounter Medications as of 05/05/2017  Medication Sig  . acetaminophen (TYLENOL) 500 MG tablet Take 500 mg by mouth 2 (two) times daily.  Marland Kitchen. alum & mag hydroxide-simeth (MINTOX) 200-200-20 MG/5ML suspension Take 15 mLs by mouth as needed for indigestion or heartburn. And prn indigestion  . amLODipine (NORVASC) 5 MG tablet Take 5 mg by mouth daily.  Marland Kitchen. atorvastatin (LIPITOR) 20 MG tablet Take 1 tablet (20 mg total) by mouth daily at 6 PM.  . clopidogrel (PLAVIX) 75 MG tablet Take 75 mg by mouth daily.   Marland Kitchen. docusate sodium (COLACE) 100 MG capsule Take 100 mg by mouth daily.  . eszopiclone (LUNESTA) 1 MG TABS tablet Take 1 mg by mouth at bedtime. Take immediately before bedtime  . HYDROcodone-acetaminophen (NORCO) 7.5-325 MG tablet Take one tablet by mouth every 6 hours as needed for moderate pain  . Menthol (COUGH DROPS) 10 MG LOZG Use as directed 1 lozenge in the mouth or throat as needed.  . Menthol, Topical Analgesic, (BIOFREEZE EX) Apply 1 application topically as needed. To neck and knees and twice daily as needed to any affected joint.   . pantoprazole (PROTONIX) 40 MG tablet Take 40 mg by mouth daily.  Bertram Gala. Polyethyl Glycol-Propyl Glycol (SYSTANE) 0.4-0.3 % GEL ophthalmic gel Place 1 application into both eyes 4 (four) times daily.  . polyethylene glycol powder (GLYCOLAX/MIRALAX) powder Take 17 g by mouth every other day. Mix in 8 oz liquid and drink  . senna-docusate (SENNA S) 8.6-50 MG tablet Take 2 tablets by mouth 2 (two) times daily as needed for mild constipation.   . SUPER B COMPLEX/C PO Take 2 tablets by mouth daily.  . [DISCONTINUED] mirabegron ER (MYRBETRIQ) 50 MG TB24 tablet Take 50 mg by mouth at bedtime.    No facility-administered encounter medications on file as of 05/05/2017.     Review of  Systems  Constitutional: Positive for fatigue. Negative for chills and fever.  HENT: Negative for congestion.   Eyes: Negative for visual disturbance.  Respiratory: Negative for chest tightness and shortness of breath.   Cardiovascular: Negative for chest pain and palpitations.  Gastrointestinal: Positive for constipation. Negative for abdominal pain.  Genitourinary: Negative for dysuria.       Urinary incontinence  Musculoskeletal: Positive for arthralgias, back pain and gait problem.  Skin: Negative for color change.  Neurological: Positive for weakness. Negative for dizziness.  Psychiatric/Behavioral: Positive for confusion.       Nursing notes more confusion as do I during visits over time, more forgetful about appts, medications, etc.    Immunization History  Administered Date(s) Administered  . Influenza Inj Mdck Quad Pf 03/13/2016  . Influenza-Unspecified 03/09/2013, 03/01/2014, 03/08/2015, 03/19/2017  . PPD Test 12/04/2011  . Pneumococcal Conjugate-13 09/12/2014  .  Pneumococcal Polysaccharide-23 09/12/2015  . Td 06/03/2016   Pertinent  Health Maintenance Due  Topic Date Due  . INFLUENZA VACCINE  Completed  . PNA vac Low Risk Adult  Completed   Fall Risk  10/21/2016 09/10/2016 02/18/2016 09/12/2015 03/21/2015  Falls in the past year? Yes Yes No No No  Number falls in past yr: 1 1 - - -  Injury with Fall? No No - - -  Comment - - - - -  Risk for fall due to : - History of fall(s) - - -  Follow up - Education provided - - -   Functional Status Survey:    Vitals:   05/05/17 1403  BP: 131/64  Pulse: (!) 55  Resp: 20  Temp: (!) 97 F (36.1 C)  TempSrc: Oral  SpO2: 93%  Weight: 172 lb (78 kg)   Body mass index is 28.62 kg/m. Physical Exam  Constitutional: She appears well-developed and well-nourished. No distress.  HENT:  Head: Normocephalic and atraumatic.  Eyes: Pupils are equal, round, and reactive to light.  Neck: No JVD present.  Cardiovascular: Normal  rate, regular rhythm and intact distal pulses.  Murmur heard. Pulmonary/Chest: Effort normal and breath sounds normal.  Abdominal: Soft. Bowel sounds are normal. She exhibits no distension. There is no tenderness.  Musculoskeletal: Normal range of motion.  Uses manual wheelchair to get around facility  Neurological: She is alert.  Oriented to person and [facility], poor short term memory  Skin: Skin is warm and dry.  Psychiatric: She has a normal mood and affect.    Labs reviewed: Recent Labs    11/17/16  NA 139  K 4.0  BUN 27*  CREATININE 0.8   No results for input(s): AST, ALT, ALKPHOS, BILITOT, PROT, ALBUMIN in the last 8760 hours. No results for input(s): WBC, NEUTROABS, HGB, HCT, MCV, PLT in the last 8760 hours. Lab Results  Component Value Date   TSH 3.16 09/20/2015   Lab Results  Component Value Date   HGBA1C 6.0 (H) 10/14/2015   Lab Results  Component Value Date   CHOL 166 11/17/2016   HDL 106 (A) 11/17/2016   LDLCALC 53 11/17/2016   TRIG 36 (A) 11/17/2016   CHOLHDL 3.2 10/14/2015    Assessment/Plan 1. DDD (degenerative disc disease), cervical -cont norco, tylenol and heating pad  2. Generalized osteoarthritis of multiple sites -cont norco, tylenol and biofreeze  3. Mixed Alzheimer's and vascular dementia -progressing, continue supportive care in SNF  4. Cerebrovascular disease -continue plavix and statin due to multiple areas of significant arterial disease including brain/neck and periphery  5. PVD (peripheral vascular disease) with claudication (HCC) -cont plavix and statin therapy -nonambulatory  6. Chronic midline low back pain without sciatica -cont norco, tylenol and heating pad  7. Drug-induced constipation -due to norco, immobility and inadequate hydration (chronic for her despite encouragement due to incontinence), cont bowel regimen which has been effective  Family/ staff Communication: discussed with SNF second shift  nurse  Labs/tests ordered:  No new  Karin Pinedo L. Altheria Shadoan, D.O. Geriatrics [facility] [address] [place], [place] [address] Cell Phone (Mon-Fri 8am-5pm):  [phone] On Call:  [phone] & follow prompts after 5pm & weekends Office Phone:  [phone] Office Fax:  [phone]

## 2017-06-02 ENCOUNTER — Encounter: Payer: Self-pay | Admitting: Internal Medicine

## 2017-06-02 ENCOUNTER — Non-Acute Institutional Stay (SKILLED_NURSING_FACILITY): Payer: Medicare Other | Admitting: Internal Medicine

## 2017-06-02 DIAGNOSIS — F5101 Primary insomnia: Secondary | ICD-10-CM | POA: Diagnosis not present

## 2017-06-02 DIAGNOSIS — M159 Polyosteoarthritis, unspecified: Secondary | ICD-10-CM | POA: Diagnosis not present

## 2017-06-02 DIAGNOSIS — M5030 Other cervical disc degeneration, unspecified cervical region: Secondary | ICD-10-CM

## 2017-06-02 DIAGNOSIS — E785 Hyperlipidemia, unspecified: Secondary | ICD-10-CM

## 2017-06-02 DIAGNOSIS — I679 Cerebrovascular disease, unspecified: Secondary | ICD-10-CM

## 2017-06-02 DIAGNOSIS — K219 Gastro-esophageal reflux disease without esophagitis: Secondary | ICD-10-CM

## 2017-06-02 DIAGNOSIS — Z7189 Other specified counseling: Secondary | ICD-10-CM

## 2017-06-02 NOTE — Progress Notes (Signed)
Patient ID: Tara Walters, female   DOB: [birth year], 84 y.o.   MRN: [id number]  Location:  [facility] Room Number: 130 Place of Service:  SNF ([phone]) Provider:  [name], DO  Patient Care Team: [name], DO as PCP - General (Geriatric Medicine) Community, Well Merrilee Jansky, NP as Nurse Practitioner (Nurse Practitioner) Marvel Plan, MD as Consulting Physician (Neurology)  Extended Emergency Contact Information Primary Emergency Contact: [name] [place] [address] [address] Home Phone: [phone] Mobile Phone: [phone] Relation: Daughter Secondary Emergency Contact: Aubriegh Minch,[place]  [place] Home Phone: [phone] Mobile Phone: [phone] Relation: Relative  Code Status:  DNR--see in documents and media Goals of care: Advanced Directive information Advanced Directives 06/02/2017  Does Patient Have a Medical Advance Directive? Yes  Type of Advance Directive Out of facility DNR (pink MOST or yellow form);Healthcare Power of Attorney  Does patient want to make changes to medical advance directive? No - Patient declined  Copy of Healthcare Power of Attorney in Chart? Yes  Pre-existing out of facility DNR order (yellow form or pink MOST form) Yellow form placed in chart (order not valid for inpatient use)   Chief Complaint  Patient presents with  . Medical Management of Chronic Issues    routine visit    HPI:  Pt is a 84 y.o. female seen today for medical management of chronic diseases.    She is dependent in adls except feeding--needs assist with bathing, dressing, grooming.  No longer ambulatory on her own and was falling more at time of move from AL.  Constipation:  Remains on senna-s 2 bid prn, miralax qod, and colace 100mg  daily.  Is not very active or mobile so prone to this.  Also not so good about hydrating.  Chronic arthritis pains:  Back, knees,  neck--ongoing, was no loner controlled with tylenol and tramadol and hydrocodone had to be added several months ago to give her relief.  Also uses heating pad in her recliner before bed and sometimes during the day for her back.    Weights reviewed:  Seems  One in august was incorrect at 155.2 lbs Marisa Cyphers others have been in 170s since June.  Appears she trended up to max of 177 in Nov and now is stable at 172 lb  Prior strokes:  On plavix, lipitor.  I previously tried to reduce her pill burden, but then she followed up with specialists and meds were added back.  Last LDL in 2018 was 53 on statin therapy.  Pt is 84 yo and frail.  Lab Results  Component Value Date   CHOL 166 11/17/2016   CHOL 181 10/14/2015   Lab Results  Component Value Date   HDL 106 (A) 11/17/2016   HDL 57 10/14/2015   Lab Results  Component Value Date   LDLCALC 53 11/17/2016   LDLCALC 108 (H) 10/14/2015   Lab Results  Component Value Date   TRIG 36 (A) 11/17/2016   TRIG 79 10/14/2015   Lab Results  Component Value Date   CHOLHDL 3.2 10/14/2015   No results found for: LDLDIRECT  HTN:  On norvasc only 5mg  daily.  BP satisfactory considering they are taken with the automatic cuff.  Insomnia:  Is dependent on lunesta for her sleep.  Other meds including melatonin, ambien, have failed her and benzos would be far worse for her.  She does sleep with the  lunesta and requests it be continued.    GERD:  On longstanding protonix.  Did not tolerate coming off before and is on plavix, as well so other ppis are not options.    Past Medical History:  Diagnosis Date  . Abnormality of gait   . Allergic rhinitis   . Anxiety   . Arthritis   . Carotid stenosis, right    asymptomatic  <40%  right  ica  and right eca  . Carpal tunnel syndrome on both sides   . Claudication, intermittent (HCC)   . COPD (chronic obstructive pulmonary disease) (HCC)   . Diverticulosis of colon   . Fracture, calcaneus closed   . History of  esophagitis   . History of hypothyroidism   . Hypertension   . Internal hemorrhoids   . Irritable bowel syndrome 03/11/2010  . PAD (peripheral artery disease) (HCC) monitored by dr Rubye Oaks   moderate  . Peripheral vascular disease (HCC)   . Stroke (HCC)   . Unspecified hereditary and idiopathic peripheral neuropathy   . Urge urinary incontinence    Past Surgical History:  Procedure Laterality Date  . CAROTID ENDARTERECTOMY Left 11-18-2007  . CATARACT EXTRACTION W/ INTRAOCULAR LENS IMPLANT Right 11/2012  . COLONOSCOPY  2005   mild diverticulosis  . CYSTOSCOPY WITH INJECTION N/A 09/06/2013   Procedure: CYSTOSCOPY WITH BOTOX  INJECTION;  Surgeon: Martina Sinner, MD;  [facility];  Service: Urology;  Laterality: N/A;  . DILATION AND CURETTAGE OF UTERUS  1973    Allergies  Allergen Reactions  . Tramadol Nausea Only    Outpatient Encounter Medications as of 06/02/2017  Medication Sig  . acetaminophen (TYLENOL) 500 MG tablet Take 500 mg by mouth 2 (two) times daily.  Marland Kitchen alum & mag hydroxide-simeth (MINTOX) 200-200-20 MG/5ML suspension Take 15 mLs by mouth as needed for indigestion or heartburn. And prn indigestion  . amLODipine (NORVASC) 5 MG tablet Take 5 mg by mouth daily.  Marland Kitchen atorvastatin (LIPITOR) 20 MG tablet Take 1 tablet (20 mg total) by mouth daily at 6 PM.  . clopidogrel (PLAVIX) 75 MG tablet Take 75 mg by mouth daily.   Marland Kitchen docusate sodium (COLACE) 100 MG capsule Take 100 mg by mouth daily.  . eszopiclone (LUNESTA) 1 MG TABS tablet Take 1 mg by mouth at bedtime. Take immediately before bedtime  . HYDROcodone-acetaminophen (NORCO) 7.5-325 MG tablet Take one tablet by mouth every 6 hours as needed for moderate pain  . Menthol (COUGH DROPS) 10 MG LOZG Use as directed 1 lozenge in the mouth or throat as needed.  . Menthol, Topical Analgesic, (BIOFREEZE EX) Apply 1 application topically as needed. To neck and knees and twice daily as needed to any affected  joint.   . pantoprazole (PROTONIX) 40 MG tablet Take 40 mg by mouth daily.  Bertram Gala Glycol-Propyl Glycol (SYSTANE) 0.4-0.3 % GEL ophthalmic gel Place 1 application into both eyes 4 (four) times daily.  . polyethylene glycol powder (GLYCOLAX/MIRALAX) powder Take 17 g by mouth every other day. Mix in 8 oz liquid and drink  . senna-docusate (SENNA S) 8.6-50 MG tablet Take 2 tablets by mouth 2 (two) times daily as needed for mild constipation.   . SUPER B COMPLEX/C PO Take 2 tablets by mouth daily.   No facility-administered encounter medications on file as of 06/02/2017.     Review of Systems  Constitutional: Negative for activity change, appetite change, chills and fever.  HENT: Positive for postnasal drip. Negative for  congestion.   Eyes: Negative for visual disturbance.  Respiratory: Negative for cough, shortness of breath and wheezing.   Cardiovascular: Negative for chest pain and palpitations.  Gastrointestinal: Positive for constipation. Negative for abdominal pain, diarrhea and nausea.  Genitourinary: Negative for dysuria.  Musculoskeletal: Positive for arthralgias, back pain and gait problem.  Skin: Negative for color change.  Neurological: Negative for dizziness.  Hematological: Bruises/bleeds easily.  Psychiatric/Behavioral: Positive for confusion and sleep disturbance. Negative for agitation and suicidal ideas. The patient is not nervous/anxious.         Nursing staff note further decline in short term memory    Immunization History  Administered Date(s) Administered  . Influenza Inj Mdck Quad Pf 03/13/2016  . Influenza-Unspecified 03/09/2013, 03/01/2014, 03/08/2015, 03/19/2017  . PPD Test 12/04/2011  . Pneumococcal Conjugate-13 09/12/2014  . Pneumococcal Polysaccharide-23 09/12/2015  . Td 06/03/2016   Pertinent  Health Maintenance Due  Topic Date Due  . INFLUENZA VACCINE  Completed  . PNA vac Low Risk Adult  Completed   Fall Risk  10/21/2016 09/10/2016 02/18/2016  09/12/2015 03/21/2015  Falls in the past year? Yes Yes No No No  Number falls in past yr: 1 1 - - -  Injury with Fall? No No - - -  Comment - - - - -  Risk for fall due to : - History of fall(s) - - -  Follow up - Education provided - - -   Functional Status Survey:    Vitals:   06/02/17 1612  BP: (!) 144/80  Pulse: 66  Resp: 18  Temp: (!) 97.4 F (36.3 C)  TempSrc: Oral  SpO2: 94%  Weight: 172 lb (78 kg)   Body mass index is 28.62 kg/m. Physical Exam  Constitutional: No distress.  HENT:  Head: Normocephalic and atraumatic.  Eyes: Conjunctivae are normal. Pupils are equal, round, and reactive to light.  Neck: Neck supple. No JVD present.  Cardiovascular: Normal rate, regular rhythm, normal heart sounds and intact distal pulses.  Pulmonary/Chest: Effort normal and breath sounds normal.  Abdominal: Soft. Bowel sounds are normal.  Musculoskeletal: Normal range of motion.  Seated in recliner in room each visit  Lymphadenopathy:    She has no cervical adenopathy.  Neurological: She is alert.  Oriented to person and place, more difficulty with history each visit  Skin: Skin is warm and dry.  Psychiatric: She has a normal mood and affect.    Labs reviewed: Recent Labs    11/17/16  NA 139  K 4.0  BUN 27*  CREATININE 0.8   No results for input(s): AST, ALT, ALKPHOS, BILITOT, PROT, ALBUMIN in the last 8760 hours. No results for input(s): WBC, NEUTROABS, HGB, HCT, MCV, PLT in the last 8760 hours. Lab Results  Component Value Date   TSH 3.16 09/20/2015   Lab Results  Component Value Date   HGBA1C 6.0 (H) 10/14/2015   Lab Results  Component Value Date   CHOL 166 11/17/2016   HDL 106 (A) 11/17/2016   LDLCALC 53 11/17/2016   TRIG 36 (A) 11/17/2016   CHOLHDL 3.2 10/14/2015   Assessment/Plan 1. Advance care planning -pt has agreed to DNR status and has [place] on chart and scanned in documents, added order aGAIN to her chart - Do not attempt resuscitation  (DNR)  2. DDD (degenerative disc disease), cervical -cont tylenol for mild to moderate pain and hydrocodone for severe pain, cont heat, as well  3. Generalized osteoarthritis of multiple sites -same as with #2, gradually worsening,  she's less mobile and does not get her joints lubricated, becomes stiff  4. Cerebrovascular disease And PVD, remains on statin and plavix for these -as her dementia progresses, we may need to readdress the benefit of these meds for her based on time to benefit esp for the statin therapy--pt also always complains about pill burden  5. Gastroesophageal reflux disease without esophagitis -was terribly bothersome in the past, cont PPI for this, protonix only choice with her plavix  6. Primary insomnia -does not do well without her lunesta, alternatives have been tried and she's been taken off of sedative/hypnotics and has not slept so she requests this be continued long-term  7. Hyperlipidemia, unspecified hyperlipidemia type -remains on statin therapy due to her vascular disease, but benefits are dwindling from the quality of life, functional status and prognostic standpoints--will need to discuss further with family in the future  Family/ staff Communication: discussed with SNF nurse  Labs/tests ordered:  No new  Yamira Papa L. Mathhew Buysse, D.O. Geriatrics [facility] [address] [place] [place], [place] [address] Cell Phone (Mon-Fri 8am-5pm):  [phone] On Call:  [phone] & follow prompts after 5pm & weekends Office Phone:  [phone] Office Fax:  [phone]

## 2017-06-22 ENCOUNTER — Non-Acute Institutional Stay (SKILLED_NURSING_FACILITY): Payer: Medicare Other | Admitting: Adult Health

## 2017-06-22 ENCOUNTER — Encounter: Payer: Self-pay | Admitting: Adult Health

## 2017-06-22 DIAGNOSIS — I10 Essential (primary) hypertension: Secondary | ICD-10-CM | POA: Diagnosis not present

## 2017-06-22 DIAGNOSIS — R531 Weakness: Secondary | ICD-10-CM

## 2017-06-22 NOTE — Progress Notes (Signed)
Location:  [facility] Room Number: 120 Place of Service:  SNF ([phone]) Provider:  Linton RumpMeredith Dunzweiler, BSN, RN, NP Student/ Fletcher Anonhristina Shirleymae Hauth, ANP  Kermit Baloeed, Tiffany L, DO  Patient Care Team: Kermit Baloeed, Tiffany L, DO as PCP - General (Geriatric Medicine) [facility], Trula Orehristina, NP as Nurse Practitioner (Nurse Practitioner) Marvel PlanXu, Jindong, MD as Consulting Physician (Neurology)  Extended Emergency Contact Information Primary Emergency Contact: Lanora ManisReed,April          [place] [id number] [place] Home Phone: [phone] Mobile Phone: [phone] Relation: Daughter Secondary Emergency Contact: Reed,Tilden  [place] Home Phone: [phone] Mobile Phone: [phone] Relation: Relative  Code Status:  DNR Goals of care: Advanced Directive information Advanced Directives 06/02/2017  Does Patient Have a Medical Advance Directive? Yes  Type of Advance Directive Out of facility DNR (pink MOST or yellow form);Healthcare Power of Attorney  Does patient want to make changes to medical advance directive? No - Patient declined  Copy of Healthcare Power of Attorney in Chart? Yes  Pre-existing out of facility DNR order (yellow form or pink MOST form) Yellow form placed in chart (order not valid for inpatient use)     Chief Complaint  Patient presents with  . Hypertension  . Weakness    HPI:  Pt is a 84 y.o. female seen today for an acute visit for hypertension and weakness. Weekly blood pressures have been elevated to >150/90 consistently for the last three weeks according to RN report. BP today is 180/90 manually per RN. Denies chest pain, lightheadedness, or SOB. Absent of fever. Pt denies issues or changes with bowel/bladder function. RN endorses increased "shakiness" when attempting to complete activity (I.e., attempting to get OOB) and swelling of the RUE with rash. Denies pain in back or neck and decreased mobility.      Past Medical History:  Diagnosis Date  . Abnormality of gait   . Allergic rhinitis   . Anxiety   . Arthritis   . Carotid stenosis, right    asymptomatic  <40%  right  ica  and right eca  . Carpal tunnel syndrome on both sides   . Claudication, intermittent (HCC)   . COPD (chronic obstructive pulmonary disease) (HCC)   . Diverticulosis of colon   . Fracture, calcaneus closed   . History of esophagitis   . History of hypothyroidism   . Hypertension   . Internal hemorrhoids   . Irritable bowel syndrome 03/11/2010  . PAD (peripheral artery disease) (HCC) monitored by dr Rubye [place]   moderate  . Peripheral vascular disease (HCC)   . Stroke (HCC)   . Unspecified hereditary and idiopathic peripheral neuropathy   . Urge urinary incontinence    Past Surgical History:  Procedure Laterality Date  . CAROTID ENDARTERECTOMY Left 11-18-2007  . CATARACT EXTRACTION W/ INTRAOCULAR LENS IMPLANT Right 11/2012  . COLONOSCOPY  2005   mild diverticulosis  . CYSTOSCOPY WITH INJECTION N/A 09/06/2013   Procedure: CYSTOSCOPY WITH BOTOX  INJECTION;  Surgeon: Martina SinnerScott A MacDiarmid, MD;  [facility] [facility];  Service: Urology;  Laterality: N/A;  . DILATION AND CURETTAGE OF UTERUS  1973    Allergies  Allergen Reactions  . Tramadol Nausea Only    Outpatient Encounter Medications as of 06/22/2017  Medication Sig  . acetaminophen (TYLENOL) 500 MG tablet Take 500 mg by mouth 2 (two) times daily.  Marland Kitchen. alum & mag hydroxide-simeth (MINTOX) 200-200-20 MG/5ML suspension Take 15 mLs by mouth as needed for  indigestion or heartburn. And prn indigestion  . amLODipine (NORVASC) 5 MG tablet Take 5 mg by mouth daily.  Marland Kitchen atorvastatin (LIPITOR) 20 MG tablet Take 1 tablet (20 mg total) by mouth daily at 6 PM.  . clopidogrel (PLAVIX) 75 MG tablet Take 75 mg by mouth daily.   Marland Kitchen docusate sodium (COLACE) 100 MG capsule Take 100 mg by mouth daily.  . eszopiclone (LUNESTA) 1 MG TABS tablet Take 1 mg by mouth  at bedtime. Take immediately before bedtime  . HYDROcodone-acetaminophen (NORCO) 7.5-325 MG tablet Take one tablet by mouth every 6 hours as needed for moderate pain (Patient taking differently: Take 1 tablet by mouth at bedtime. Take one tablet additionally by mouth every 6 hours as needed for moderate pain)  . Menthol (COUGH DROPS) 10 MG LOZG Use as directed 1 lozenge in the mouth or throat as needed.  . Menthol, Topical Analgesic, (BIOFREEZE EX) Apply 1 application topically as needed. To neck and knees and twice daily as needed to any affected joint.   . pantoprazole (PROTONIX) 40 MG tablet Take 40 mg by mouth daily.  Bertram Gala Glycol-Propyl Glycol (SYSTANE) 0.4-0.3 % GEL ophthalmic gel Place 1 application into both eyes 4 (four) times daily.  . polyethylene glycol powder (GLYCOLAX/MIRALAX) powder Take 17 g by mouth every other day. Mix in 8 oz liquid and drink  . senna-docusate (SENNA S) 8.6-50 MG tablet Take 2 tablets by mouth 2 (two) times daily as needed for mild constipation.   . SUPER B COMPLEX/C PO Take 2 tablets by mouth daily.   No facility-administered encounter medications on file as of 06/22/2017.     Review of Systems  Constitutional: Negative for activity change, appetite change, fatigue, fever and unexpected weight change.  Respiratory: Negative.  Negative for shortness of breath.   Cardiovascular: Negative for chest pain, palpitations and leg swelling.  Gastrointestinal: Negative.   Genitourinary: Negative for difficulty urinating, dysuria, frequency and urgency.  Musculoskeletal: Positive for arthralgias and gait problem. Negative for back pain and myalgias.  Skin: Negative for color change.  Neurological: Positive for weakness. Negative for dizziness, syncope, light-headedness and headaches.  Psychiatric/Behavioral: Positive for confusion.    Immunization History  Administered Date(s) Administered  . Influenza Inj Mdck Quad Pf 03/13/2016  . Influenza-Unspecified  03/09/2013, 03/01/2014, 03/08/2015, 03/19/2017  . PPD Test 12/04/2011  . Pneumococcal Conjugate-13 09/12/2014  . Pneumococcal Polysaccharide-23 09/12/2015  . Td 06/03/2016   Pertinent  Health Maintenance Due  Topic Date Due  . INFLUENZA VACCINE  Completed  . PNA vac Low Risk Adult  Completed   Fall Risk  10/21/2016 09/10/2016 02/18/2016 09/12/2015 03/21/2015  Falls in the past year? Yes Yes No No No  Number falls in past yr: 1 1 - - -  Injury with Fall? No No - - -  Comment - - - - -  Risk for fall due to : - History of fall(s) - - -  Follow up - Education provided - - -   Functional Status Survey:    Vitals:   06/22/17 1052  BP: (!) 180/90  Pulse: (!) 102  Resp: 18  Temp: 97.8 F (36.6 C)  SpO2: 93%   There is no height or weight on file to calculate BMI. Physical Exam  Constitutional: She is oriented to person, place, and time. She appears well-developed and well-nourished.  HENT:  Equal smile and eyebrow raise.   Eyes: Conjunctivae and lids are normal. Lids are everted and swept, no foreign bodies found.  Neck: Full passive range of motion without pain. No spinous process tenderness and no muscular tenderness present.  Cardiovascular: Normal rate, regular rhythm, normal heart sounds and intact distal pulses.  Pulses:      Radial pulses are 2+ on the right side, and 2+ on the left side.  Pulmonary/Chest: Effort normal and breath sounds normal.  Abdominal: Soft. Bowel sounds are normal. There is tenderness in the suprapubic area.  Musculoskeletal: She exhibits no edema.  Bilateral hand grip moderate. Able to push against resistance of BUE without weakness present. Weakness in BLE/BUE upon attempting to stand. Does shake with attempt. Unable to stand on her own.  Neurological: She is alert and oriented to person, place, and time. No cranial nerve deficit.  Pt able to appropriately answer assessment questions. Dementia at baseline.   Skin: Skin is warm and dry. No erythema.    No BUE swelling. Appears to be predominantly adipose tissue. No rash noted to the RUE.   Psychiatric: She has a normal mood and affect. Her speech is normal and behavior is normal. Judgment and thought content normal. Cognition and memory are normal.  Nursing note and vitals reviewed.   Labs reviewed: Recent Labs    11/17/16  NA 139  K 4.0  BUN 27*  CREATININE 0.8   No results for input(s): AST, ALT, ALKPHOS, BILITOT, PROT, ALBUMIN in the last 8760 hours. No results for input(s): WBC, NEUTROABS, HGB, HCT, MCV, PLT in the last 8760 hours. Lab Results  Component Value Date   TSH 3.16 09/20/2015   Lab Results  Component Value Date   HGBA1C 6.0 (H) 10/14/2015   Lab Results  Component Value Date   CHOL 166 11/17/2016   HDL 106 (A) 11/17/2016   LDLCALC 53 11/17/2016   TRIG 36 (A) 11/17/2016   CHOLHDL 3.2 10/14/2015    Significant Diagnostic Results in last 30 days:  No results found.  Assessment/Plan Hypertension:  BP elevated this morning 180/90 manually per RN report. Consistently elevated over the past three weeks to >150/90 per RN charting. Denies pain, urinary, or bowel symptoms. She did have some tenderness to suprapubic palpation but this seemed to be reactionary, related to her dementia. Due to consistently elevated Bps over several weeks, believe issue is uncontrolled essential HTN. Currently taking 5 mg Amlodipine daily. With elevated Bps and history of CVAs, will increase dose of Amlodipine to 10 mg daily. Will have RN staff monitor Bps for 5 days following as to not increase risk of falls with lowering BP too low or too quickly. Will plan to reassess as needed.  Weakness:  Pt has a history of stroke. No new unilateral weakness or facial droop present. No signs of concern for acute CVA present. Weakness is an ongoing issue with daily fluctuations. "Shakiness" is present with increased attempts at activity likely due to decreasing muscle strength with continual increase  in dependence. Will continue to monitoring following initiation of increased BP medication.    Family/ staff Communication: discussed with RN  Labs/tests ordered:  BMP in am

## 2017-06-29 ENCOUNTER — Encounter: Payer: Self-pay | Admitting: Adult Health

## 2017-06-29 ENCOUNTER — Non-Acute Institutional Stay (SKILLED_NURSING_FACILITY): Payer: Medicare Other | Admitting: Adult Health

## 2017-06-29 DIAGNOSIS — R109 Unspecified abdominal pain: Secondary | ICD-10-CM | POA: Diagnosis not present

## 2017-06-29 DIAGNOSIS — R7989 Other specified abnormal findings of blood chemistry: Secondary | ICD-10-CM | POA: Diagnosis not present

## 2017-06-29 DIAGNOSIS — R110 Nausea: Secondary | ICD-10-CM | POA: Diagnosis not present

## 2017-06-29 DIAGNOSIS — R1031 Right lower quadrant pain: Secondary | ICD-10-CM | POA: Diagnosis not present

## 2017-06-29 DIAGNOSIS — D649 Anemia, unspecified: Secondary | ICD-10-CM | POA: Diagnosis not present

## 2017-06-29 NOTE — Progress Notes (Signed)
[facility] Room Number: 130 Place of Service:  SNF [phone]) Provider:  Linton Rump, AGNP-Student/ Fletcher Anon, ANP  [name], DO  Patient Care Team: [name], DO as PCP - General (Geriatric Medicine) [name], [name], NP as Nurse Practitioner (Nurse Practitioner) Marvel Plan, MD as Consulting Physician (Neurology)  Extended Emergency Contact Information Primary Emergency Contact: [name] [place] [address] [place] Home Phone: [phone] Mobile Phone: [phone] Relation: Daughter Secondary Emergency Contact: Reed,Tilden  [place] Home Phone: [phone] Mobile Phone: [phone] Relation: Relative  Code Status:  DNR Goals of care: Advanced Directive information Advanced Directives 06/02/2017  Does Patient Have a Medical Advance Directive? Yes  Type of Advance Directive Out of facility DNR (pink MOST or yellow form);Healthcare Power of Attorney  Does patient want to make changes to medical advance directive? No - Patient declined  Copy of Healthcare Power of Attorney in Chart? Yes  Pre-existing out of facility DNR order (yellow form or pink MOST form) Yellow form placed in chart (order not valid for inpatient use)     Chief Complaint  Patient presents with  . Abdominal Pain  . Nausea    HPI:  Pt is a 84 y.o. female seen today for an acute visit for intermittent RLQ abdominal pain and nausea for few days. Pt denies changes to bowel or voiding habits. No dysuria, frequency, urgency, or new onset incontinence. States (and supported by RN) she had large BM yesterday (06/28/17). Pt states she has one every other day when she receives Miralax. Endorses increased fatigue with onset of symptoms. Pt cannot identify any aggravating or relieving factors. She has tried nothing for treatment to date. Severity is moderate as she missed a meal  yesterday d/t the pain.    Past Medical History:  Diagnosis Date  . Abnormality of gait   . Allergic rhinitis   . Anxiety   . Arthritis   . Carotid stenosis, right    asymptomatic  <40%  right  ica  and right eca  . Carpal tunnel syndrome on both sides   . Claudication, intermittent (HCC)   . COPD (chronic obstructive pulmonary disease) (HCC)   . Diverticulosis of colon   . Fracture, calcaneus closed   . History of esophagitis   . History of hypothyroidism   . Hypertension   . Internal hemorrhoids   . Irritable bowel syndrome 03/11/2010  . PAD (peripheral artery disease) (HCC) monitored by dr Rubye [place]   moderate  . Peripheral vascular disease (HCC)   . Stroke (HCC)   . Unspecified hereditary and idiopathic peripheral neuropathy   . Urge urinary incontinence    Past Surgical History:  Procedure Laterality Date  . CAROTID ENDARTERECTOMY Left 11-18-2007  . CATARACT EXTRACTION W/ INTRAOCULAR LENS IMPLANT Right 11/2012  . COLONOSCOPY  2005   mild diverticulosis  . CYSTOSCOPY WITH INJECTION N/A 09/06/2013   Procedure: CYSTOSCOPY WITH BOTOX  INJECTION;  Surgeon: Martina Sinner, MD;  Location: [facility] [place];  Service: Urology;  Laterality: N/A;  . DILATION AND CURETTAGE OF UTERUS  1973    Allergies  Allergen Reactions  . Tramadol Nausea Only    Outpatient Encounter Medications as of 06/29/2017  Medication Sig  . acetaminophen (TYLENOL) 500 MG tablet Take 500 mg by mouth 2 (two) times daily.  Marland Kitchen alum & mag hydroxide-simeth (MINTOX) 200-200-20 MG/5ML suspension Take 15 mLs  by mouth as needed for indigestion or heartburn. And prn indigestion  . amLODipine (NORVASC) 5 MG tablet Take 10 mg by mouth daily.   Marland Kitchen atorvastatin (LIPITOR) 20 MG tablet Take 1 tablet (20 mg total) by mouth daily at 6 PM.  . clopidogrel (PLAVIX) 75 MG tablet Take 75 mg by mouth daily.   Marland Kitchen docusate sodium (COLACE) 100 MG capsule Take 100 mg by mouth daily.  . eszopiclone (LUNESTA) 1 MG TABS  tablet Take 1 mg by mouth at bedtime. Take immediately before bedtime  . HYDROcodone-acetaminophen (NORCO) 7.5-325 MG tablet Take one tablet by mouth every 6 hours as needed for moderate pain (Patient taking differently: Take 1 tablet by mouth at bedtime. Take one tablet additionally by mouth every 6 hours as needed for moderate pain)  . Menthol (COUGH DROPS) 10 MG LOZG Use as directed 1 lozenge in the mouth or throat as needed.  . Menthol, Topical Analgesic, (BIOFREEZE EX) Apply 1 application topically as needed. To neck and knees and twice daily as needed to any affected joint.   . pantoprazole (PROTONIX) 40 MG tablet Take 40 mg by mouth daily.  Bertram Gala Glycol-Propyl Glycol (SYSTANE) 0.4-0.3 % GEL ophthalmic gel Place 1 application into both eyes 4 (four) times daily.  . polyethylene glycol powder (GLYCOLAX/MIRALAX) powder Take 17 g by mouth every other day. Mix in 8 oz liquid and drink  . senna-docusate (SENNA S) 8.6-50 MG tablet Take 2 tablets by mouth 2 (two) times daily as needed for mild constipation.   . SUPER B COMPLEX/C PO Take 2 tablets by mouth daily.   No facility-administered encounter medications on file as of 06/29/2017.     Review of Systems  Constitutional: Positive for activity change, appetite change and fatigue. Negative for fever and unexpected weight change.       Complaints of feeling warm.   Respiratory: Negative.   Cardiovascular: Negative.   Gastrointestinal: Positive for abdominal pain and nausea. Negative for abdominal distention, constipation, diarrhea and vomiting.  Genitourinary: Negative for difficulty urinating, dysuria, flank pain, frequency, hematuria and urgency.       Denies incontinence.  Musculoskeletal: Negative for back pain.  Skin: Negative.   Neurological: Negative.   Psychiatric/Behavioral: Negative.     Immunization History  Administered Date(s) Administered  . Influenza Inj Mdck Quad Pf 03/13/2016  . Influenza-Unspecified 03/09/2013,  03/01/2014, 03/08/2015, 03/19/2017  . PPD Test 12/04/2011  . Pneumococcal Conjugate-13 09/12/2014  . Pneumococcal Polysaccharide-23 09/12/2015  . Td 06/03/2016   Pertinent  Health Maintenance Due  Topic Date Due  . INFLUENZA VACCINE  Completed  . PNA vac Low Risk Adult  Completed   Fall Risk  10/21/2016 09/10/2016 02/18/2016 09/12/2015 03/21/2015  Falls in the past year? Yes Yes No No No  Number falls in past yr: 1 1 - - -  Injury with Fall? No No - - -  Comment - - - - -  Risk for fall due to : - History of fall(s) - - -  Follow up - Education provided - - -   Functional Status Survey:    Vitals:   06/29/17 1611  BP: (!) 154/80  Pulse: (!) 59  Resp: 16  Temp: (!) 97.4 F (36.3 C)  SpO2: 96%   There is no height or weight on file to calculate BMI. Physical Exam  Constitutional: She appears well-developed and well-nourished.  Appears fatigued. Drowsy with conversation.   Cardiovascular: Normal rate, regular rhythm, normal heart sounds and intact distal pulses.  Pos pedal pulse to BLE with no discoloration  Pulmonary/Chest: Effort normal and breath sounds normal.  Abdominal: Soft. Bowel sounds are normal. She exhibits no distension. There is no splenomegaly or hepatomegaly. Tenderness: suprapubic and rlq. There is no rigidity, no rebound, no guarding and no CVA tenderness.  Musculoskeletal: She exhibits no edema or tenderness.  Neurological: She is alert.  Baseline vascular dementia.   Skin: Skin is warm and dry.  Nursing note and vitals reviewed.   Labs reviewed: Recent Labs    11/17/16  NA 139  K 4.0  BUN 27*  CREATININE 0.8   No results for input(s): AST, ALT, ALKPHOS, BILITOT, PROT, ALBUMIN in the last 8760 hours. No results for input(s): WBC, NEUTROABS, HGB, HCT, MCV, PLT in the last 8760 hours. Lab Results  Component Value Date   TSH 3.16 09/20/2015   Lab Results  Component Value Date   HGBA1C 6.0 (H) 10/14/2015   Lab Results  Component Value Date    CHOL 166 11/17/2016   HDL 106 (A) 11/17/2016   LDLCALC 53 11/17/2016   TRIG 36 (A) 11/17/2016   CHOLHDL 3.2 10/14/2015    Significant Diagnostic Results in last 30 days:  No results found.  Assessment/Plan 1. Right lower quadrant abdominal pain Pt supports RLQ abdominal pain and right upper thigh pain.  Negative CVA tenderness. No sign of circulatory compromise. Pt denies urinary symptoms, but suspectful of possible UTI due to her prior hx of UTI. UA and C&S ordered to do via I&O to assess whether retention is an underlying issue. BMP & CBC ordered also to assess for underlying infection and anemia, respectfully.  Will assess results and reevaluate plan. If no sign of UTI may order abd ultrasound and liver panel  2. Nausea Pt complains of intermittent nausea for a week. D/t nausea and presence of non-specific symptoms, will plan to do a KUB to assess for possible ileus and obstruction. Apparently this has also been a chronic problem in the past when [place]. Due to her advanced dementia and non specific complaints she has been difficult to assess.    Family/ staff Communication: RN notified.   Labs/tests ordered:  CBC, BMP UA C and S

## 2017-06-30 DIAGNOSIS — R319 Hematuria, unspecified: Secondary | ICD-10-CM | POA: Diagnosis not present

## 2017-06-30 DIAGNOSIS — R8290 Unspecified abnormal findings in urine: Secondary | ICD-10-CM | POA: Diagnosis not present

## 2017-06-30 DIAGNOSIS — Z79899 Other long term (current) drug therapy: Secondary | ICD-10-CM | POA: Diagnosis not present

## 2017-07-27 ENCOUNTER — Non-Acute Institutional Stay (SKILLED_NURSING_FACILITY): Payer: Medicare Other | Admitting: Adult Health

## 2017-07-27 DIAGNOSIS — M62838 Other muscle spasm: Secondary | ICD-10-CM | POA: Diagnosis not present

## 2017-07-27 NOTE — Progress Notes (Signed)
[facility]   Place of Service:  SNF (31) Provider:   Peggye Leyhristy Asucena Galer, ANP [facility] [phone]   Kermit Baloeed, Tiffany L, DO  Patient Care Team: Kermit Baloeed, Tiffany L, DO as PCP - General (Geriatric Medicine) [facility] Jazzlin Clements, Trula Orehristina, NP as Nurse Practitioner (Nurse Practitioner) Marvel PlanXu, Jindong, MD as Consulting Physician (Neurology)  Extended Emergency Contact Information Primary Emergency Contact: Lanora ManisReed,April          [place] [id number] [place] Home Phone: [phone] Mobile Phone: [phone] Relation: Daughter Secondary Emergency Contact: Reed,Tilden  [place] Home Phone: [phone] Mobile Phone: [phone] Relation: Relative  Code Status:  DNR Goals of care: Advanced Directive information Advanced Directives 06/02/2017  Does Patient Have a Medical Advance Directive? Yes  Type of Advance Directive Out of facility DNR (pink MOST or yellow form);Healthcare Power of Attorney  Does patient want to make changes to medical advance directive? No - Patient declined  Copy of Healthcare Power of Attorney in Chart? Yes  Pre-existing out of facility DNR order (yellow form or pink MOST form) Yellow form placed in chart (order not valid for inpatient use)     Chief Complaint  Patient presents with  . Acute Visit    neck pain    HPI:  Pt is a 84 y.o. female seen today for an acute visit for neck pain. She reports that she has felt neck tightness on the right side for two weeks. It is quite bothersome to her and has effected her sleep. She takes norco chronically at night for arthritic pain but this seems worse. There is a hx of DDD of the neck and muscle tightness. She wears a supportive pillow to her neck when in the chair. The nurse says she has been falling asleep in the recliner and her neck has been falling forward.  Cervical neck xray 12/27/10 IMPRESSION:  1.  Slight positional curve  or spasm with cervical thoracic spine convex to right. 2.  Diffuse osteopenia without compression fracture. 3.  Slight degenerative disc disease C4-5 and lesser C5-6 with slight multilevel facet and uncinate degenerative joint disease. 4.  No acute findings. Past Medical History:  Diagnosis Date  . Abnormality of gait   . Allergic rhinitis   . Anxiety   . Arthritis   . Carotid stenosis, right    asymptomatic  <40%  right  ica  and right eca  . Carpal tunnel syndrome on both sides   . Claudication, intermittent (HCC)   . COPD (chronic obstructive pulmonary disease) (HCC)   . Diverticulosis of colon   . Fracture, calcaneus closed   . History of esophagitis   . History of hypothyroidism   . Hypertension   . Internal hemorrhoids   . Irritable bowel syndrome 03/11/2010  . PAD (peripheral artery disease) (HCC) monitored by dr Rubye Oak[place]ickerson   moderate  . Peripheral vascular disease (HCC)   . Stroke (HCC)   . Unspecified hereditary and idiopathic peripheral neuropathy   . Urge urinary incontinence    Past Surgical History:  Procedure Laterality Date  . CAROTID ENDARTERECTOMY Left 11-18-2007  . CATARACT EXTRACTION W/ INTRAOCULAR LENS IMPLANT Right 11/2012  . COLONOSCOPY  2005   mild diverticulosis  . CYSTOSCOPY WITH INJECTION N/A 09/06/2013   Procedure: CYSTOSCOPY WITH BOTOX  INJECTION;  Surgeon: Martina SinnerScott A MacDiarmid, MD;  [facility];  Service: Urology;  Laterality: N/A;  . DILATION AND CURETTAGE OF UTERUS  1973  Allergies  Allergen Reactions  . Tramadol Nausea Only    Outpatient Encounter Medications as of 07/27/2017  Medication Sig  . acetaminophen (TYLENOL) 500 MG tablet Take 500 mg by mouth 2 (two) times daily.  Marland Kitchen alum & mag hydroxide-simeth (MINTOX) 200-200-20 MG/5ML suspension Take 15 mLs by mouth as needed for indigestion or heartburn. And prn indigestion  . amLODipine (NORVASC) 5 MG tablet Take 10 mg by mouth daily.   Marland Kitchen atorvastatin (LIPITOR) 20 MG  tablet Take 1 tablet (20 mg total) by mouth daily at 6 PM.  . clopidogrel (PLAVIX) 75 MG tablet Take 75 mg by mouth daily.   Marland Kitchen docusate sodium (COLACE) 100 MG capsule Take 100 mg by mouth daily.  . eszopiclone (LUNESTA) 1 MG TABS tablet Take 1 mg by mouth at bedtime. Take immediately before bedtime  . HYDROcodone-acetaminophen (NORCO) 7.5-325 MG tablet Take one tablet by mouth every 6 hours as needed for moderate pain (Patient taking differently: Take 1 tablet by mouth at bedtime. Take one tablet additionally by mouth every 6 hours as needed for moderate pain)  . Menthol (COUGH DROPS) 10 MG LOZG Use as directed 1 lozenge in the mouth or throat as needed.  . Menthol, Topical Analgesic, (BIOFREEZE EX) Apply 1 application topically as needed. To neck and knees and twice daily as needed to any affected joint.   . pantoprazole (PROTONIX) 40 MG tablet Take 40 mg by mouth daily.  Bertram Gala Glycol-Propyl Glycol (SYSTANE) 0.4-0.3 % GEL ophthalmic gel Place 1 application into both eyes 4 (four) times daily.  . polyethylene glycol powder (GLYCOLAX/MIRALAX) powder Take 17 g by mouth every other day. Mix in 8 oz liquid and drink  . senna-docusate (SENNA S) 8.6-50 MG tablet Take 2 tablets by mouth 2 (two) times daily as needed for mild constipation.   . SUPER B COMPLEX/C PO Take 2 tablets by mouth daily.   No facility-administered encounter medications on file as of 07/27/2017.     Review of Systems  Constitutional: Negative for activity change, appetite change and fever.  Musculoskeletal: Positive for arthralgias, gait problem, neck pain and neck stiffness. Negative for joint swelling and myalgias.  Neurological: Positive for tremors. Negative for dizziness, seizures, syncope, facial asymmetry, speech difficulty, light-headedness, numbness and headaches.    Immunization History  Administered Date(s) Administered  . Influenza Inj Mdck Quad Pf 03/13/2016  . Influenza-Unspecified 03/09/2013, 03/01/2014,  03/08/2015, 03/19/2017  . PPD Test 12/04/2011  . Pneumococcal Conjugate-13 09/12/2014  . Pneumococcal Polysaccharide-23 09/12/2015  . Td 06/03/2016   Pertinent  Health Maintenance Due  Topic Date Due  . INFLUENZA VACCINE  Completed  . PNA vac Low Risk Adult  Completed   Fall Risk  10/21/2016 09/10/2016 02/18/2016 09/12/2015 03/21/2015  Falls in the past year? Yes Yes No No No  Number falls in past yr: 1 1 - - -  Injury with Fall? No No - - -  Comment - - - - -  Risk for fall due to : - History of fall(s) - - -  Follow up - Education provided - - -   Functional Status Survey:    There were no vitals filed for this visit. There is no height or weight on file to calculate BMI. Physical Exam  Constitutional: No distress.  Musculoskeletal: She exhibits no edema or tenderness.       Arms: Equal strength to BUE 4/5  Neurological: She is alert.  Reflex Scores:      Tricep reflexes are 2+ on  the right side and 2+ on the left side.      Bicep reflexes are 2+ on the right side and 2+ on the left side.      Brachioradialis reflexes are 2+ on the right side and 2+ on the left side. Skin: Skin is warm and dry. She is not diaphoretic.  Psychiatric: She has a normal mood and affect.    Labs reviewed: Recent Labs    11/17/16  NA 139  K 4.0  BUN 27*  CREATININE 0.8   No results for input(s): AST, ALT, ALKPHOS, BILITOT, PROT, ALBUMIN in the last 8760 hours. No results for input(s): WBC, NEUTROABS, HGB, HCT, MCV, PLT in the last 8760 hours. Lab Results  Component Value Date   TSH 3.16 09/20/2015   Lab Results  Component Value Date   HGBA1C 6.0 (H) 10/14/2015   Lab Results  Component Value Date   CHOL 166 11/17/2016   HDL 106 (A) 11/17/2016   LDLCALC 53 11/17/2016   TRIG 36 (A) 11/17/2016   CHOLHDL 3.2 10/14/2015    Significant Diagnostic Results in last 30 days:  No results found.  Assessment/Plan  1. Trapezius muscle spasm Robaxin 500 mg BID x 7 days Heat 20 min BID  for 7 days Biofreeze with massage BID for 7 days    Family/ staff Communication: discussed with resident and nurse  Labs/tests ordered:  NA

## 2017-07-28 ENCOUNTER — Encounter: Payer: Self-pay | Admitting: Adult Health

## 2017-08-03 DIAGNOSIS — M6281 Muscle weakness (generalized): Secondary | ICD-10-CM | POA: Diagnosis not present

## 2017-08-03 DIAGNOSIS — R531 Weakness: Secondary | ICD-10-CM | POA: Diagnosis not present

## 2017-08-03 DIAGNOSIS — R2681 Unsteadiness on feet: Secondary | ICD-10-CM | POA: Diagnosis not present

## 2017-08-04 DIAGNOSIS — M6281 Muscle weakness (generalized): Secondary | ICD-10-CM | POA: Diagnosis not present

## 2017-08-04 DIAGNOSIS — R531 Weakness: Secondary | ICD-10-CM | POA: Diagnosis not present

## 2017-08-04 DIAGNOSIS — R2681 Unsteadiness on feet: Secondary | ICD-10-CM | POA: Diagnosis not present

## 2017-08-05 DIAGNOSIS — M6281 Muscle weakness (generalized): Secondary | ICD-10-CM | POA: Diagnosis not present

## 2017-08-05 DIAGNOSIS — R2681 Unsteadiness on feet: Secondary | ICD-10-CM | POA: Diagnosis not present

## 2017-08-05 DIAGNOSIS — R531 Weakness: Secondary | ICD-10-CM | POA: Diagnosis not present

## 2017-08-11 DIAGNOSIS — M6281 Muscle weakness (generalized): Secondary | ICD-10-CM | POA: Diagnosis not present

## 2017-08-11 DIAGNOSIS — R2681 Unsteadiness on feet: Secondary | ICD-10-CM | POA: Diagnosis not present

## 2017-08-11 DIAGNOSIS — R531 Weakness: Secondary | ICD-10-CM | POA: Diagnosis not present

## 2017-08-13 DIAGNOSIS — R2681 Unsteadiness on feet: Secondary | ICD-10-CM | POA: Diagnosis not present

## 2017-08-13 DIAGNOSIS — R531 Weakness: Secondary | ICD-10-CM | POA: Diagnosis not present

## 2017-08-13 DIAGNOSIS — M6281 Muscle weakness (generalized): Secondary | ICD-10-CM | POA: Diagnosis not present

## 2017-08-14 DIAGNOSIS — H2512 Age-related nuclear cataract, left eye: Secondary | ICD-10-CM | POA: Diagnosis not present

## 2017-08-14 DIAGNOSIS — H35372 Puckering of macula, left eye: Secondary | ICD-10-CM | POA: Diagnosis not present

## 2017-08-14 DIAGNOSIS — H353132 Nonexudative age-related macular degeneration, bilateral, intermediate dry stage: Secondary | ICD-10-CM | POA: Diagnosis not present

## 2017-08-18 DIAGNOSIS — R531 Weakness: Secondary | ICD-10-CM | POA: Diagnosis not present

## 2017-08-18 DIAGNOSIS — M6281 Muscle weakness (generalized): Secondary | ICD-10-CM | POA: Diagnosis not present

## 2017-08-18 DIAGNOSIS — R2681 Unsteadiness on feet: Secondary | ICD-10-CM | POA: Diagnosis not present

## 2017-08-19 DIAGNOSIS — R531 Weakness: Secondary | ICD-10-CM | POA: Diagnosis not present

## 2017-08-19 DIAGNOSIS — R2681 Unsteadiness on feet: Secondary | ICD-10-CM | POA: Diagnosis not present

## 2017-08-19 DIAGNOSIS — M6281 Muscle weakness (generalized): Secondary | ICD-10-CM | POA: Diagnosis not present

## 2017-08-21 DIAGNOSIS — R2681 Unsteadiness on feet: Secondary | ICD-10-CM | POA: Diagnosis not present

## 2017-08-21 DIAGNOSIS — M6281 Muscle weakness (generalized): Secondary | ICD-10-CM | POA: Diagnosis not present

## 2017-08-21 DIAGNOSIS — R531 Weakness: Secondary | ICD-10-CM | POA: Diagnosis not present

## 2017-08-25 DIAGNOSIS — R531 Weakness: Secondary | ICD-10-CM | POA: Diagnosis not present

## 2017-08-25 DIAGNOSIS — R2681 Unsteadiness on feet: Secondary | ICD-10-CM | POA: Diagnosis not present

## 2017-08-25 DIAGNOSIS — M6281 Muscle weakness (generalized): Secondary | ICD-10-CM | POA: Diagnosis not present

## 2017-09-02 ENCOUNTER — Encounter (HOSPITAL_COMMUNITY): Payer: Medicare Other

## 2017-09-02 ENCOUNTER — Ambulatory Visit: Payer: Medicare Other | Admitting: Family

## 2017-09-04 ENCOUNTER — Ambulatory Visit (INDEPENDENT_AMBULATORY_CARE_PROVIDER_SITE_OTHER)
Admission: RE | Admit: 2017-09-04 | Discharge: 2017-09-04 | Disposition: A | Discharge status: Home / Self Care | Payer: Medicare Other | Source: Ambulatory Visit | Attending: Family | Admitting: Family

## 2017-09-04 ENCOUNTER — Other Ambulatory Visit: Payer: Self-pay

## 2017-09-04 ENCOUNTER — Ambulatory Visit (HOSPITAL_COMMUNITY)
Admission: RE | Admit: 2017-09-04 | Discharge: 2017-09-04 | Disposition: A | Discharge status: Home / Self Care | Payer: Medicare Other | Source: Ambulatory Visit | Attending: Family | Admitting: Family

## 2017-09-04 ENCOUNTER — Encounter: Payer: Self-pay | Admitting: Family

## 2017-09-04 ENCOUNTER — Ambulatory Visit (INDEPENDENT_AMBULATORY_CARE_PROVIDER_SITE_OTHER): Payer: Medicare Other | Admitting: Family

## 2017-09-04 VITALS — BP 142/61 | HR 56 | Temp 96.6°F | Resp 16 | Ht 65.0 in

## 2017-09-04 DIAGNOSIS — I739 Peripheral vascular disease, unspecified: Secondary | ICD-10-CM

## 2017-09-04 DIAGNOSIS — I6522 Occlusion and stenosis of left carotid artery: Secondary | ICD-10-CM

## 2017-09-04 DIAGNOSIS — E785 Hyperlipidemia, unspecified: Secondary | ICD-10-CM | POA: Insufficient documentation

## 2017-09-04 DIAGNOSIS — I6521 Occlusion and stenosis of right carotid artery: Secondary | ICD-10-CM | POA: Insufficient documentation

## 2017-09-04 DIAGNOSIS — Z9889 Other specified postprocedural states: Secondary | ICD-10-CM | POA: Diagnosis not present

## 2017-09-04 DIAGNOSIS — Z8673 Personal history of transient ischemic attack (TIA), and cerebral infarction without residual deficits: Secondary | ICD-10-CM | POA: Diagnosis not present

## 2017-09-04 DIAGNOSIS — I10 Essential (primary) hypertension: Secondary | ICD-10-CM | POA: Insufficient documentation

## 2017-09-04 DIAGNOSIS — Z87891 Personal history of nicotine dependence: Secondary | ICD-10-CM | POA: Insufficient documentation

## 2017-09-04 NOTE — Patient Instructions (Signed)
Stroke Prevention Some health problems and behaviors may make it more likely for you to have a stroke. Below are ways to lessen your risk of having a stroke.  Be active for at least 30 minutes on most or all days.  Do not smoke. Try not to be around others who smoke.  Do not drink too much alcohol. ? Do not have more than 2 drinks a day if you are a man. ? Do not have more than 1 drink a day if you are a woman and are not pregnant.  Eat healthy foods, such as fruits and vegetables. If you were put on a specific diet, follow the diet as told.  Keep your cholesterol levels under control through diet and medicines. Look for foods that are low in saturated fat, trans fat, cholesterol, and are high in fiber.  If you have diabetes, follow all diet plans and take your medicine as told.  Ask your doctor if you need treatment to lower your blood pressure. If you have high blood pressure (hypertension), follow all diet plans and take your medicine as told by your doctor.  If you are 18-39 years old, have your blood pressure checked every 3-5 years. If you are age 40 or older, have your blood pressure checked every year.  Keep a healthy weight. Eat foods that are low in calories, salt, saturated fat, trans fat, and cholesterol.  Do not take drugs.  Avoid birth control pills, if this applies. Talk to your doctor about the risks of taking birth control pills.  Talk to your doctor if you have sleep problems (sleep apnea).  Take all medicine as told by your doctor. ? You may be told to take aspirin or blood thinner medicine. Take this medicine as told by your doctor. ? Understand your medicine instructions.  Make sure any other conditions you have are being taken care of.  Get help right away if:  You suddenly lose feeling (you feel numb) or have weakness in your face, arm, or leg.  Your face or eyelid hangs down to one side.  You suddenly feel confused.  You have trouble talking  (aphasia) or understanding what people are saying.  You suddenly have trouble seeing in one or both eyes.  You suddenly have trouble walking.  You are dizzy.  You lose your balance or your movements are clumsy (uncoordinated).  You suddenly have a very bad headache and you do not know the cause.  You have new chest pain.  Your heart feels like it is fluttering or skipping a beat (irregular heartbeat). Do not wait to see if the symptoms above go away. Get help right away. Call your local emergency services (911 in [place] Do not drive yourself to the hospital. This information is not intended to replace advice given to you by your health care provider. Make sure you discuss any questions you have with your health care provider. Document Released: 11/11/2011 Document Revised: 10/18/2015 Document Reviewed: 11/12/2012 Elsevier Interactive Patient Education  2018 [employer] Inc[employer]     Peripheral Vascular Disease Peripheral vascular disease (PVD) is a disease of the blood vessels that are not part of your heart and brain. A simple term for PVD is poor circulation. In most cases, PVD narrows the blood vessels that carry blood from your heart to the rest of your body. This can result in a decreased supply of blood to your arms, legs, and internal organs, like your stomach or kidneys. However, it most often affects   a person's lower legs and feet. There are two types of PVD.  Organic PVD. This is the more common type. It is caused by damage to the structure of blood vessels.  Functional PVD. This is caused by conditions that make blood vessels contract and tighten (spasm).  Without treatment, PVD tends to get worse over time. PVD can also lead to acute ischemic limb. This is when an arm or limb suddenly has trouble getting enough blood. This is a medical emergency. Follow these instructions at home:  Take medicines only as told by your doctor.  Do not use any tobacco products, including  cigarettes, chewing tobacco, or electronic cigarettes. If you need help quitting, ask your doctor.  Lose weight if you are overweight, and maintain a healthy weight as told by your doctor.  Eat a diet that is low in fat and cholesterol. If you need help, ask your doctor.  Exercise regularly. Ask your doctor for some good activities for you.  Take good care of your feet. ? Wear comfortable shoes that fit well. ? Check your feet often for any cuts or sores. Contact a doctor if:  You have cramps in your legs while walking.  You have leg pain when you are at rest.  You have coldness in a leg or foot.  Your skin changes.  You are unable to get or have an erection (erectile dysfunction).  You have cuts or sores on your feet that are not healing. Get help right away if:  Your arm or leg turns cold and blue.  Your arms or legs become red, warm, swollen, painful, or numb.  You have chest pain or trouble breathing.  You suddenly have weakness in your face, arm, or leg.  You become very confused or you cannot speak.  You suddenly have a very bad headache.  You suddenly cannot see. This information is not intended to replace advice given to you by your health care provider. Make sure you discuss any questions you have with your health care provider. Document Released: 08/06/2009 Document Revised: 10/18/2015 Document Reviewed: 10/20/2013 Elsevier Interactive Patient Education  2017 [employer] Inc.  

## 2017-09-04 NOTE — Progress Notes (Signed)
[facility] [place] HISTORY AND PHYSICAL   CC: Follow up extracranial carotid artery stenosis and PAD   History of Present Illness:   Tara Walters is a 84 y.o. female who is s/p left CEA in 2009 by Dr. Edilia Bo and also is followed for moderate PAD.  She is a resident of [facility] facility.   Pt denies claudication in legs with walking, she does report neuropathy in her feet, worse with walking.  She no longer walks, states this is due to severe neuropathy, pt does not know reason for neuropathy, no known back problems. She has tight feeling in her calves and feet at rest, denies non healing ulcers on legs/feet.  Pt states her blood pressure later in the day is better, higher in the morning and when she is upset.  She was admitted to [facility] ED in May 2017 with CVA. MRI of the brain from that visit shows punctate areas of acute nonhemorrhagic infarction in the posterior right frontal lobe as well as remote lacunar infarcts of the basal ganglia and brainstem.  Pt reports some mild residual left hand tingling. She denies any speech difficulties or vision changes with this CVA.   Pt Diabetic: No Pt smoker: former smoker, quit in the 1970's  Pt meds include: Statin :yes ASA: No Other anticoagulants/antiplatelets: Plavix   Current Outpatient Medications  Medication Sig Dispense Refill  . acetaminophen (TYLENOL) 500 MG tablet Take 500 mg by mouth 2 (two) times daily.    Marland Kitchen alum & mag hydroxide-simeth (MINTOX) 200-200-20 MG/5ML suspension Take 15 mLs by mouth as needed for indigestion or heartburn. And prn indigestion    . amLODipine (NORVASC) 5 MG tablet Take 10 mg by mouth daily.     Marland Kitchen atorvastatin (LIPITOR) 20 MG tablet Take 1 tablet (20 mg total) by mouth daily at 6 PM. 30 tablet 5  . clopidogrel (PLAVIX) 75 MG tablet Take 75 mg by mouth daily.     Marland Kitchen docusate sodium (COLACE) 100 MG capsule Take 100 mg by mouth daily.    . eszopiclone (LUNESTA) 1  MG TABS tablet Take 1 mg by mouth at bedtime. Take immediately before bedtime    . HYDROcodone-acetaminophen (NORCO) 7.5-325 MG tablet Take one tablet by mouth every 6 hours as needed for moderate pain (Patient taking differently: Take 1 tablet by mouth at bedtime. Take one tablet additionally by mouth every 6 hours as needed for moderate pain) 120 tablet 0  . Menthol (COUGH DROPS) 10 MG LOZG Use as directed 1 lozenge in the mouth or throat as needed.    . Menthol, Topical Analgesic, (BIOFREEZE EX) Apply 1 application topically as needed. To neck and knees and twice daily as needed to any affected joint.     . pantoprazole (PROTONIX) 40 MG tablet Take 40 mg by mouth daily.    Bertram Gala Glycol-Propyl Glycol (SYSTANE) 0.4-0.3 % GEL ophthalmic gel Place 1 application into both eyes 4 (four) times daily.    . polyethylene glycol powder (GLYCOLAX/MIRALAX) powder Take 17 g by mouth every other day. Mix in 8 oz liquid and drink    . senna-docusate (SENNA S) 8.6-50 MG tablet Take 2 tablets by mouth 2 (two) times daily as needed for mild constipation.     . SUPER B COMPLEX/C PO Take 2 tablets by mouth daily.     No current facility-administered medications for this visit.     Past Medical History:  Diagnosis Date  . Abnormality of gait   .  Allergic rhinitis   . Anxiety   . Arthritis   . Carotid stenosis, right    asymptomatic  <40%  right  ica  and right eca  . Carpal tunnel syndrome on both sides   . Claudication, intermittent (HCC)   . COPD (chronic obstructive pulmonary disease) (HCC)   . Diverticulosis of colon   . Fracture, calcaneus closed   . History of esophagitis   . History of hypothyroidism   . Hypertension   . Internal hemorrhoids   . Irritable bowel syndrome 03/11/2010  . PAD (peripheral artery disease) (HCC) monitored by dr Rubye Oaks   moderate  . Peripheral vascular disease (HCC)   . Stroke (HCC)   . Unspecified hereditary and idiopathic peripheral neuropathy   . Urge  urinary incontinence     Social History Social History   Tobacco Use  . Smoking status: Former Smoker    Packs/day: 1.00    Years: 40.00    Pack years: 40.00    Types: Cigarettes    Last attempt to quit: 05/27/1983    Years since quitting: 34.2  . Smokeless tobacco: Never Used  Substance Use Topics  . Alcohol use: No    Alcohol/week: 0.0 oz  . Drug use: No    Family History Family History  Problem Relation Age of Onset  . Heart attack Mother   . Cancer Father        Leukemia  . Diabetes Father     Surgical History Past Surgical History:  Procedure Laterality Date  . CAROTID ENDARTERECTOMY Left 11-18-2007  . CATARACT EXTRACTION W/ INTRAOCULAR LENS IMPLANT Right 11/2012  . COLONOSCOPY  2005   mild diverticulosis  . CYSTOSCOPY WITH INJECTION N/A 09/06/2013   Procedure: CYSTOSCOPY WITH BOTOX  INJECTION;  Surgeon: Martina Sinner, MD;  Location: [facility];  Service: Urology;  Laterality: N/A;  . DILATION AND CURETTAGE OF UTERUS  1973    Allergies  Allergen Reactions  . Tramadol Nausea Only    Current Outpatient Medications  Medication Sig Dispense Refill  . acetaminophen (TYLENOL) 500 MG tablet Take 500 mg by mouth 2 (two) times daily.    Marland Kitchen alum & mag hydroxide-simeth (MINTOX) 200-200-20 MG/5ML suspension Take 15 mLs by mouth as needed for indigestion or heartburn. And prn indigestion    . amLODipine (NORVASC) 5 MG tablet Take 10 mg by mouth daily.     Marland Kitchen atorvastatin (LIPITOR) 20 MG tablet Take 1 tablet (20 mg total) by mouth daily at 6 PM. 30 tablet 5  . clopidogrel (PLAVIX) 75 MG tablet Take 75 mg by mouth daily.     Marland Kitchen docusate sodium (COLACE) 100 MG capsule Take 100 mg by mouth daily.    . eszopiclone (LUNESTA) 1 MG TABS tablet Take 1 mg by mouth at bedtime. Take immediately before bedtime    . HYDROcodone-acetaminophen (NORCO) 7.5-325 MG tablet Take one tablet by mouth every 6 hours as needed for moderate pain (Patient taking differently: Take 1  tablet by mouth at bedtime. Take one tablet additionally by mouth every 6 hours as needed for moderate pain) 120 tablet 0  . Menthol (COUGH DROPS) 10 MG LOZG Use as directed 1 lozenge in the mouth or throat as needed.    . Menthol, Topical Analgesic, (BIOFREEZE EX) Apply 1 application topically as needed. To neck and knees and twice daily as needed to any affected joint.     . pantoprazole (PROTONIX) 40 MG tablet Take 40 mg by mouth daily.    Marland Kitchen  Polyethyl Glycol-Propyl Glycol (SYSTANE) 0.4-0.3 % GEL ophthalmic gel Place 1 application into both eyes 4 (four) times daily.    . polyethylene glycol powder (GLYCOLAX/MIRALAX) powder Take 17 g by mouth every other day. Mix in 8 oz liquid and drink    . senna-docusate (SENNA S) 8.6-50 MG tablet Take 2 tablets by mouth 2 (two) times daily as needed for mild constipation.     . SUPER B COMPLEX/C PO Take 2 tablets by mouth daily.     No current facility-administered medications for this visit.      REVIEW OF SYSTEMS: See HPI for pertinent positives and negatives.  Physical Examination Vitals:   09/04/17 1151 09/04/17 1157  BP: 112/70 (!) 142/61  Pulse: (!) 56 (!) 56  Resp: 16   Temp: (!) 96.6 F (35.9 C)   TempSrc: Oral   SpO2: 92%   Height: 5\' 5"  (1.651 m)    Body mass index is 28.62 kg/m.  General:  WDWN female in NAD Gait: seated in wheelchair HENT: WNL Eyes: PERRLA Pulmonary: normal non-labored breathing, no rales, rhonchi, or wheezing Cardiac: RRR, no detected murmur Abdomen: soft, NT, no palpable masses Skin: no rashes, no ulcers, no cellulitis.   VASCULAR EXAM  Carotid Bruits Left Right   negative negative   radial pulses are 2+ palpable and = Abdomina;l aortic pulse is not palpable   VASCULAR EXAM: Extremitieswithout ischemic changes  without Gangrene; without open wounds.      LE Pulses LEFT RIGHT   FEMORAL  not palpable seated in w/c  not palpable seated in w/c    POPLITEAL not palpable  not palpable   POSTERIOR TIBIAL not palpable  not palpable    DORSALIS PEDIS  ANTERIOR TIBIAL not palpable  not palpable    Musculoskeletal: no muscle wasting or atrophy; no edema. Wearing knee high graduated compression hose. Neurologic:  A&O X 3; appropriate affect, sensation is normal; speech is normal, CN 2-12 intact, pain and light touch intact in extremities, motor exam as listed above. Psychiatric: Normal thought content, mood appropriate to clinical situation.     ASSESSMENT:  SHAKEETA GODETTE is a 84 y.o. female who is s/p left CEA in 2009 and also is followed for moderate PAD.  Pt states she no longer walks other than a few steps, this is due to severe neuropathy, pt does not know reason for neuropathy, no known back problems. She has no signs of of ischemia in her feet/legs, pedal pulses are not palpable.  She states she is performing daily seated legs exercises; she most likely is since ABi's and TBIs remain stable even thought she rarely walks.   She was admitted to [facility] ED in May 2017 with an ischemic CVA. MRI of the brain from that visit shows punctate areas of acute nonhemorrhagic infarction in the posterior right frontal lobe as well as remote lacunar infarcts of the basal ganglia and brainstem. She denies any subsequent stroke or TIA.  Pt reports some mild residual left hand tingling.  DATA  Carotid Duplex (09-04-17): 1-39% stenosis of the right ICA No restenosis of the left ICA (CEA site). No significant stenosis of the left ECA or bilateral CCA. >50% stenosis of the right ECA. Bilateral vertebral artery flow is antegrade.  Bilateral subclavian artery waveforms are normal.  No significant  change compared to exams of 08/09/14, 08-15-15, and 08-27-16.   ABI (Date: 09/04/2017):  R:   ABI: 0.68 (was 0.69 on 08-27-16),   PT: mono  DP: mono  TBI:  0.51 (was 0.49)  L:   ABI: 0.69 (was 0.63),   PT: mono  DP: mono  TBI: 0.55 (was 0.43)  Stable bilateral ABI with moderate disease and monophasic waveforms, TBI's remain stable.    PLAN:   Daily seated active leg exercises. PT ordered to facilitate this. Based on today's exam and non-invasive vascular lab results, the patient will follow up in 1 year with the following tests: carotid duplex and ABI's. I advised pt and her attendant to return sooner for concerns re the circulation in her feet or legs.     I discussed in depth with the patient the nature of atherosclerosis, and emphasized the importance of maximal medical management including strict control of blood pressure, blood glucose, and lipid levels, obtaining regular exercise, and cessation of smoking.  The patient is aware that without maximal medical management the underlying atherosclerotic disease process will progress, limiting the benefit of any interventions.  The patient was given information about stroke prevention and what symptoms should prompt the patient to seek immediate medical care.  The patient was given information about PAD including signs, symptoms, treatment, what symptoms should prompt the patient to seek immediate medical care, and risk reduction measures to take.  Thank you for allowing us to participate in this patient's care.  Charisse MarchSuzanne Nickel, RN, MSN, FNP-C [facility] Office: [phone]  Clinic MD: Fields/Cain 09/04/2017 12:02 PM

## 2017-09-06 ENCOUNTER — Encounter: Payer: Self-pay | Admitting: Family

## 2017-09-10 NOTE — Telephone Encounter (Signed)
error 

## 2017-09-16 ENCOUNTER — Encounter: Payer: Self-pay | Admitting: Internal Medicine

## 2017-09-16 ENCOUNTER — Non-Acute Institutional Stay (SKILLED_NURSING_FACILITY): Payer: Medicare Other

## 2017-09-16 ENCOUNTER — Non-Acute Institutional Stay (SKILLED_NURSING_FACILITY): Payer: Medicare Other | Admitting: Internal Medicine

## 2017-09-16 DIAGNOSIS — F0280 Dementia in other diseases classified elsewhere without behavioral disturbance: Secondary | ICD-10-CM | POA: Diagnosis not present

## 2017-09-16 DIAGNOSIS — G253 Myoclonus: Secondary | ICD-10-CM | POA: Diagnosis not present

## 2017-09-16 DIAGNOSIS — Z7189 Other specified counseling: Secondary | ICD-10-CM

## 2017-09-16 DIAGNOSIS — G309 Alzheimer's disease, unspecified: Secondary | ICD-10-CM

## 2017-09-16 DIAGNOSIS — I639 Cerebral infarction, unspecified: Secondary | ICD-10-CM | POA: Diagnosis not present

## 2017-09-16 DIAGNOSIS — Z0000 Encounter for general adult medical examination without abnormal findings: Secondary | ICD-10-CM

## 2017-09-16 DIAGNOSIS — R5383 Other fatigue: Secondary | ICD-10-CM | POA: Diagnosis not present

## 2017-09-16 DIAGNOSIS — F0150 Vascular dementia without behavioral disturbance: Secondary | ICD-10-CM | POA: Diagnosis not present

## 2017-09-16 DIAGNOSIS — R7989 Other specified abnormal findings of blood chemistry: Secondary | ICD-10-CM | POA: Diagnosis not present

## 2017-09-16 DIAGNOSIS — D649 Anemia, unspecified: Secondary | ICD-10-CM | POA: Diagnosis not present

## 2017-09-16 DIAGNOSIS — R251 Tremor, unspecified: Secondary | ICD-10-CM | POA: Diagnosis not present

## 2017-09-16 LAB — BASIC METABOLIC PANEL
BUN: 26 — AB (ref 4–21)
CREATININE: 0.7 (ref 0.5–1.1)
Glucose: 127
Potassium: 4.5 (ref 3.4–5.3)
Sodium: 141 (ref 137–147)

## 2017-09-16 LAB — CBC AND DIFFERENTIAL
HCT: 36 (ref 36–46)
Hemoglobin: 11.5 — AB (ref 12.0–16.0)
PLATELETS: 182 (ref 150–399)
WBC: 18.3

## 2017-09-16 LAB — HEPATIC FUNCTION PANEL
ALK PHOS: 86 (ref 25–125)
ALT: 19 (ref 7–35)
AST: 20 (ref 13–35)
Bilirubin, Total: 0.3

## 2017-09-16 NOTE — Patient Instructions (Signed)
[name] , Thank you for taking time to come for your Medicare Wellness Visit. I appreciate your ongoing commitment to your health goals. Please review the following plan we discussed and let me know if I can assist you in the future.   Screening recommendations/referrals: Colonoscopy excluded, pt is over age 82 Mammogram excluded, pt is over age 82 Bone Density up to date Recommended yearly ophthalmology/optometry visit for glaucoma screening and checkup Recommended yearly dental visit for hygiene and checkup  Vaccinations: Influenza vaccine up to date, due 2019 fall season Pneumococcal vaccine up to date, completed Tdap vaccine up to date, due 06/03/2026 Shingles vaccine not in past records    Advanced directives: in chart  Conditions/risks identified: none  Next appointment: Dr. Renato Gailseed makes rounds   Preventive Care 65 Years and Older, Female Preventive care refers to lifestyle choices and visits with your health care provider that can promote health and wellness. What does preventive care include?  A yearly physical exam. This is also called an annual well check.  Dental exams once or twice a year.  Routine eye exams. Ask your health care provider how often you should have your eyes checked.  Personal lifestyle choices, including:  Daily care of your teeth and gums.  Regular physical activity.  Eating a healthy diet.  Avoiding tobacco and drug use.  Limiting alcohol use.  Practicing safe sex.  Taking low-dose aspirin every day.  Taking vitamin and mineral supplements as recommended by your health care provider. What happens during an annual well check? The services and screenings done by your health care provider during your annual well check will depend on your age, overall health, lifestyle risk factors, and family history of disease. Counseling  Your health care provider may ask you questions about your:  Alcohol use.  Tobacco use.  Drug  use.  Emotional well-being.  Home and relationship well-being.  Sexual activity.  Eating habits.  History of falls.  Memory and ability to understand (cognition).  Work and work Astronomerenvironment.  Reproductive health. Screening  You may have the following tests or measurements:  Height, weight, and BMI.  Blood pressure.  Lipid and cholesterol levels. These may be checked every 5 years, or more frequently if you are over 82 years old.  Skin check.  Lung cancer screening. You may have this screening every year starting at age 82 if you have a 30-pack-year history of smoking and currently smoke or have quit within the past 15 years.  Fecal occult blood test (FOBT) of the stool. You may have this test every year starting at age 550.  Flexible sigmoidoscopy or colonoscopy. You may have a sigmoidoscopy every 5 years or a colonoscopy every 10 years starting at age 82.  Hepatitis C blood test.  Hepatitis B blood test.  Sexually transmitted disease (STD) testing.  Diabetes screening. This is done by checking your blood sugar (glucose) after you have not eaten for a while (fasting). You may have this done every 1-3 years.  Bone density scan. This is done to screen for osteoporosis. You may have this done starting at age 82.  Mammogram. This may be done every 1-2 years. Talk to your health care provider about how often you should have regular mammograms. Talk with your health care provider about your test results, treatment options, and if necessary, the need for more tests. Vaccines  Your health care provider may recommend certain vaccines, such as:  Influenza vaccine. This is recommended every year.  Tetanus, diphtheria,  and acellular pertussis (Tdap, Td) vaccine. You may need a Td booster every 10 years.  Zoster vaccine. You may need this after age 65.  Pneumococcal 13-valent conjugate (PCV13) vaccine. One dose is recommended after age 61.  Pneumococcal polysaccharide  (PPSV23) vaccine. One dose is recommended after age 80. Talk to your health care provider about which screenings and vaccines you need and how often you need them. This information is not intended to replace advice given to you by your health care provider. Make sure you discuss any questions you have with your health care provider. Document Released: 06/08/2015 Document Revised: 01/30/2016 Document Reviewed: 03/13/2015 Elsevier Interactive Patient Education  2017 [place] Prevention in the Home Falls can cause injuries. They can happen to people of all ages. There are many things you can do to make your home safe and to help prevent falls. What can I do on the outside of my home?  Regularly fix the edges of walkways and driveways and fix any cracks.  Remove anything that might make you trip as you walk through a door, such as a raised step or threshold.  Trim any bushes or trees on the path to your home.  Use bright outdoor lighting.  Clear any walking paths of anything that might make someone trip, such as rocks or tools.  Regularly check to see if handrails are loose or broken. Make sure that both sides of any steps have handrails.  Any raised decks and porches should have guardrails on the edges.  Have any leaves, snow, or ice cleared regularly.  Use sand or salt on walking paths during winter.  Clean up any spills in your garage right away. This includes oil or grease spills. What can I do in the bathroom?  Use night lights.  Install grab bars by the toilet and in the tub and shower. Do not use towel bars as grab bars.  Use non-skid mats or decals in the tub or shower.  If you need to sit down in the shower, use a plastic, non-slip stool.  Keep the floor dry. Clean up any water that spills on the floor as soon as it happens.  Remove soap buildup in the tub or shower regularly.  Attach bath mats securely with double-sided non-slip rug tape.  Do not have  throw rugs and other things on the floor that can make you trip. What can I do in the bedroom?  Use night lights.  Make sure that you have a light by your bed that is easy to reach.  Do not use any sheets or blankets that are too big for your bed. They should not hang down onto the floor.  Have a firm chair that has side arms. You can use this for support while you get dressed.  Do not have throw rugs and other things on the floor that can make you trip. What can I do in the kitchen?  Clean up any spills right away.  Avoid walking on wet floors.  Keep items that you use a lot in easy-to-reach places.  If you need to reach something above you, use a strong step stool that has a grab bar.  Keep electrical cords out of the way.  Do not use floor polish or wax that makes floors slippery. If you must use wax, use non-skid floor wax.  Do not have throw rugs and other things on the floor that can make you trip. What can I do with my  stairs?  Do not leave any items on the stairs.  Make sure that there are handrails on both sides of the stairs and use them. Fix handrails that are broken or loose. Make sure that handrails are as long as the stairways.  Check any carpeting to make sure that it is firmly attached to the stairs. Fix any carpet that is loose or worn.  Avoid having throw rugs at the top or bottom of the stairs. If you do have throw rugs, attach them to the floor with carpet tape.  Make sure that you have a light switch at the top of the stairs and the bottom of the stairs. If you do not have them, ask someone to add them for you. What else can I do to help prevent falls?  Wear shoes that:  Do not have high heels.  Have rubber bottoms.  Are comfortable and fit you well.  Are closed at the toe. Do not wear sandals.  If you use a stepladder:  Make sure that it is fully opened. Do not climb a closed stepladder.  Make sure that both sides of the stepladder are  locked into place.  Ask someone to hold it for you, if possible.  Clearly mark and make sure that you can see:  Any grab bars or handrails.  First and last steps.  Where the edge of each step is.  Use tools that help you move around (mobility aids) if they are needed. These include:  Canes.  Walkers.  Scooters.  Crutches.  Turn on the lights when you go into a dark area. Replace any light bulbs as soon as they burn out.  Set up your furniture so you have a clear path. Avoid moving your furniture around.  If any of your floors are uneven, fix them.  If there are any pets around you, be aware of where they are.  Review your medicines with your doctor. Some medicines can make you feel dizzy. This can increase your chance of falling. Ask your doctor what other things that you can do to help prevent falls. This information is not intended to replace advice given to you by your health care provider. Make sure you discuss any questions you have with your health care provider. Document Released: 03/08/2009 Document Revised: 10/18/2015 Document Reviewed: 06/16/2014 Elsevier Interactive Patient Education  2017 [employer].

## 2017-09-16 NOTE — Progress Notes (Signed)
Subjective:   Tara Walters is a 84 y.o. female who presents for Medicare Annual (Subsequent) preventive examination at [facility]  Last AWV-09/10/2016       Objective:     Vitals: BP 138/60 (BP Location: Left Arm, Patient Position: Supine)   Pulse 88   Temp 98.8 F (37.1 C) (Oral)   Ht 5\' 5"  (1.651 m)   Wt 172 lb (78 kg)   SpO2 (!) 85%   BMI 28.62 kg/m   Body mass index is 28.62 kg/m.  Provider and nurse on the floor at [facility] notified of O2 level  Advanced Directives 09/16/2017 06/02/2017 05/05/2017 02/27/2017 01/27/2017 12/11/2016 11/18/2016  Does Patient Have a Medical Advance Directive? Yes Yes Yes Yes - Yes Yes  Type of Advance Directive Healthcare Power of [place];Out of facility DNR (pink MOST or yellow form) Out of facility DNR (pink MOST or yellow form);Healthcare Power of [employer] of [place];Living will;Out of facility DNR (pink MOST or yellow form) Out of facility DNR (pink MOST or yellow form);Living will;Healthcare Power of Attorney Out of facility DNR (pink MOST or yellow form);Living will;Healthcare Power of Attorney Out of facility DNR (pink MOST or yellow form);Healthcare Power of [employer] of facility DNR (pink MOST or yellow form);Healthcare Power of Attorney  Does patient want to make changes to medical advance directive? No - Patient declined No - Patient declined No - Patient declined - - - -  Copy of Healthcare Power of Attorney in Chart? Yes Yes Yes Yes Yes Yes Yes  Pre-existing out of facility DNR order (yellow form or pink MOST form) Yellow form placed in chart (order not valid for inpatient use) Yellow form placed in chart (order not valid for inpatient use) Yellow form placed in chart (order not valid for inpatient use) Yellow form placed in chart (order not valid for inpatient use) Yellow form placed in chart (order not valid for inpatient use) Yellow form placed in chart (order not valid for inpatient use) Yellow form  placed in chart (order not valid for inpatient use)    Tobacco Social History   Tobacco Use  Smoking Status Former Smoker  . Packs/day: 1.00  . Years: 40.00  . Pack years: 40.00  . Types: Cigarettes  . Last attempt to quit: 05/27/1983  . Years since quitting: 34.3  Smokeless Tobacco Never Used     Counseling given: Not Answered   Clinical Intake:  Pre-visit preparation completed: No  Pain : No/denies pain     Nutritional Risks: None Diabetes: No     Interpreter Needed?: No  Information entered by :: Tyron Russell, RN  Past Medical History:  Diagnosis Date  . Abnormality of gait   . Allergic rhinitis   . Anxiety   . Arthritis   . Carotid stenosis, right    asymptomatic  <40%  right  ica  and right eca  . Carpal tunnel syndrome on both sides   . Claudication, intermittent (HCC)   . COPD (chronic obstructive pulmonary disease) (HCC)   . Diverticulosis of colon   . Fracture, calcaneus closed   . History of esophagitis   . History of hypothyroidism   . Hypertension   . Internal hemorrhoids   . Irritable bowel syndrome 03/11/2010  . PAD (peripheral artery disease) (HCC) monitored by dr Rubye Oaks   moderate  . Peripheral vascular disease (HCC)   . Stroke (HCC)   . Unspecified hereditary and idiopathic peripheral neuropathy   . Urge urinary  incontinence    Past Surgical History:  Procedure Laterality Date  . CAROTID ENDARTERECTOMY Left 11-18-2007  . CATARACT EXTRACTION W/ INTRAOCULAR LENS IMPLANT Right 11/2012  . COLONOSCOPY  2005   mild diverticulosis  . CYSTOSCOPY WITH INJECTION N/A 09/06/2013   Procedure: CYSTOSCOPY WITH BOTOX  INJECTION;  Surgeon: Martina SinnerScott A MacDiarmid, MD;  [facility];  Service: Urology;  Laterality: N/A;  . DILATION AND CURETTAGE OF UTERUS  1973   Family History  Problem Relation Age of Onset  . Heart attack Mother   . Cancer Father        Leukemia  . Diabetes Father    Social History   Socioeconomic  History  . Marital status: Widowed    Spouse name: Not on file  . Number of children: Not on file  . Years of education: Not on file  . Highest education level: Not on file  Occupational History  . Occupation: retired Chief Operating Officerbuyer  Social Needs  . Financial resource strain: Not hard at all  . Food insecurity:    Worry: Never true    Inability: Never true  . Transportation needs:    Medical: No    Non-medical: No  Tobacco Use  . Smoking status: Former Smoker    Packs/day: 1.00    Years: 40.00    Pack years: 40.00    Types: Cigarettes    Last attempt to quit: 05/27/1983    Years since quitting: 34.3  . Smokeless tobacco: Never Used  Substance and Sexual Activity  . Alcohol use: No    Alcohol/week: 0.0 oz  . Drug use: No  . Sexual activity: Never  Lifestyle  . Physical activity:    Days per week: 0 days    Minutes per session: 0 min  . Stress: Not at all  Relationships  . Social connections:    Talks on phone: Twice a week    Gets together: Twice a week    Attends religious service: Never    Active member of club or organization: No    Attends meetings of clubs or organizations: Never    Relationship status: Widowed  Other Topics Concern  . Not on file  Social History Narrative   Widowed, Retired Production managerbuyer.  Patient lives in  Assisted Living  section at [facility] since [facility],[place] [place].     Stop smoking 1985, previously smoked one pack a day for about 50 years   . Minimal  Alcohol history   Patient has  Advanced planning documents: Living Will, DNR, POA   No brothers or sisters. No children, five stepchildren.    Walks with walker             Outpatient Encounter Medications as of 09/16/2017  Medication Sig  . acetaminophen (TYLENOL) 500 MG tablet Take 500 mg by mouth 2 (two) times daily.  Marland Kitchen. alum & mag hydroxide-simeth (MINTOX) 200-200-20 MG/5ML suspension Take 15 mLs by mouth as needed for indigestion or  heartburn. And prn indigestion  . amLODipine (NORVASC) 5 MG tablet Take 10 mg by mouth daily.   Marland Kitchen. atorvastatin (LIPITOR) 20 MG tablet Take 1 tablet (20 mg total) by mouth daily at 6 PM.  . clopidogrel (PLAVIX) 75 MG tablet Take 75 mg by mouth daily.   Marland Kitchen. docusate sodium (COLACE) 100 MG capsule Take 100 mg by mouth daily.  . eszopiclone (LUNESTA) 1 MG TABS tablet Take 1 mg by mouth at bedtime. Take  immediately before bedtime  . HYDROcodone-acetaminophen (NORCO) 7.5-325 MG tablet Take one tablet by mouth every 6 hours as needed for moderate pain (Patient taking differently: Take 1 tablet by mouth at bedtime. Take one tablet additionally by mouth every 6 hours as needed for moderate pain)  . Menthol (COUGH DROPS) 10 MG LOZG Use as directed 1 lozenge in the mouth or throat as needed.  . Menthol, Topical Analgesic, (BIOFREEZE EX) Apply 1 application topically as needed. To neck and knees and twice daily as needed to any affected joint.   . pantoprazole (PROTONIX) 40 MG tablet Take 40 mg by mouth daily.  Bertram Gala Glycol-Propyl Glycol (SYSTANE) 0.4-0.3 % GEL ophthalmic gel Place 1 application into both eyes 4 (four) times daily.  . polyethylene glycol powder (GLYCOLAX/MIRALAX) powder Take 17 g by mouth every other day. Mix in 8 oz liquid and drink  . senna-docusate (SENNA S) 8.6-50 MG tablet Take 2 tablets by mouth 2 (two) times daily as needed for mild constipation.   . SUPER B COMPLEX/C PO Take 2 tablets by mouth daily.   No facility-administered encounter medications on file as of 09/16/2017.     Activities of Daily Living In your present state of health, do you have any difficulty performing the following activities: 09/16/2017  Hearing? N  Vision? N  Difficulty concentrating or making decisions? Y  Walking or climbing stairs? Y  Dressing or bathing? Y  Doing errands, shopping? Y  Preparing Food and eating ? Y  Using the Toilet? Y  In the past six months, have you accidently leaked urine? Y    Do you have problems with loss of bowel control? Y  Managing your Medications? Y  Managing your Finances? Y  Housekeeping or managing your Housekeeping? Y  Some recent data might be hidden    Patient Care Team: [name], DO as PCP - General (Geriatric Medicine) Community, [facility], [name], NP as Nurse Practitioner (Nurse Practitioner) Marvel Plan, MD as Consulting Physician (Neurology)    Assessment:   This is a routine wellness examination for Tara Walters.  Exercise Activities and Dietary recommendations Current Exercise Habits: The patient does not participate in regular exercise at present, Exercise limited by: orthopedic condition(s)  Goals    None      Fall Risk Fall Risk  09/16/2017 10/21/2016 09/10/2016 02/18/2016 09/12/2015  Falls in the past year? No Yes Yes No No  Number falls in past yr: - 1 1 - -  Injury with Fall? - No No - -  Comment - - - - -  Risk for fall due to : - - History of fall(s) - -  Follow up - - Education provided - -   Is the patient's home free of loose throw rugs in walkways, pet beds, electrical cords, etc?   yes      Grab bars in the bathroom? yes      Handrails on the stairs?   yes      Adequate lighting?   yes  Depression Screen PHQ 2/9 Scores 09/16/2017 09/10/2016 02/18/2016 09/12/2015  PHQ - 2 Score 0 0 0 0     Cognitive Function: completed within last year MMSE - Mini Mental State Exam 05/12/2017 09/10/2016  Orientation to time 4 4  Orientation to Place 5 5  Registration 3 3  Attention/ Calculation 2 0  Recall 2 0  Language- name 2 objects 2 2  Language- repeat 1 1  Language- follow 3 step command 0  1  Language- read & follow direction 1 1  Write a sentence 0 1  Copy design 0 0  Total score 20 18        Immunization History  Administered Date(s) Administered  . Influenza Inj Mdck Quad Pf 03/13/2016  . Influenza-Unspecified 03/09/2013, 03/01/2014, 03/08/2015, 03/19/2017  . PPD Test 12/04/2011  .  Pneumococcal Conjugate-13 09/12/2014  . Pneumococcal Polysaccharide-23 09/12/2015  . Td 06/03/2016    Qualifies for Shingles Vaccine? Not in past records  Screening Tests Health Maintenance  Topic Date Due  . INFLUENZA VACCINE  12/24/2017  . TETANUS/TDAP  06/03/2026  . PNA vac Low Risk Adult  Completed    Cancer Screenings: Lung: Low Dose CT Chest recommended if Age 37-80 years, 30 pack-year currently smoking OR have quit w/in 15years. Patient does qualify. Breast:  Up to date on Mammogram? Yes   Up to date of Bone Density/Dexa? Yes Colorectal: up to date  Additional Screenings: : Hepatitis C Screening: declined     Plan:    I have personally reviewed and addressed the Medicare Annual Wellness questionnaire and have noted the following in the patient's chart:  A. Medical and social history B. Use of alcohol, tobacco or illicit drugs  C. Current medications and supplements D. Functional ability and status E.  Nutritional status F.  Physical activity G. Advance directives H. List of other physicians I.  Hospitalizations, surgeries, and ER visits in previous 12 months J.  Vitals K. Screenings to include hearing, vision, cognitive, depression L. Referrals and appointments - none  In addition, I have reviewed and discussed with patient certain preventive protocols, quality metrics, and best practice recommendations. A written personalized care plan for preventive services as well as general preventive health recommendations were provided to patient.  See attached scanned questionnaire for additional information.   Signed,   Tyron Russell, RN Nurse Health Advisor  Patient concerns: None

## 2017-09-16 NOTE — Progress Notes (Signed)
Patient ID: Tara BeckJanet F Walters, female   DOB: Jan 27, 1922, 84 y.o.   MRN: [id number]  Location:  [facility] Room Number: 130 Place of Service:  SNF ([phone]) Provider:   Kermit Baloeed, Cassey Hurrell L, DO  Patient Care Team: Kermit Baloeed, Shailah Gibbins L, DO as PCP - General (Geriatric Medicine) Community[facility], Christina, NP as Nurse Practitioner (Nurse Practitioner) Marvel PlanXu, Jindong, MD as Consulting Physician (Neurology)  Extended Emergency Contact Information Primary Emergency Contact: Lanora Maniseed,April          [place] [id number] [place] Home Phone: [phone] Mobile Phone: [phone] Relation: Daughter Secondary Emergency Contact: Antoniette Peake,[place]  [place] Home Phone: [phone] Mobile Phone: [phone] Relation: Relative  Code Status:  DNR Goals of care: Advanced Directive information Advanced Directives 09/16/2017  Does Patient Have a Medical Advance Directive? Yes  Type of Estate agentAdvance Directive Healthcare Power of [place];Living will;Out of facility DNR (pink MOST or yellow form)  Does patient want to make changes to medical advance directive? No - Patient declined  Copy of Healthcare Power of Attorney in Chart? Yes  Pre-existing out of facility DNR order (yellow form or pink MOST form) Yellow form placed in chart (order not valid for inpatient use)     Chief Complaint  Patient presents with  . Acute Visit    possible stroke with increased left sided weakness    HPI:  Pt is a 84 y.o. female with h/o vascular dementia, prior strokes, COPD, PAD, carotid stenosis, urinary incontinence, chronic low back pain and generalized OA seen today for an acute visit for possible stroke with increased left-sided weakness upon awakening this am.  She has had some mild weakness of her left side since a prior stroke, but this am was unable to move her left side at all, very sleepy and minimally verbal when SNF nurse and nurse manager evaluated  her.  They came to get me in clinic and I went to see her.  At that point, she was able to move her left side, but could not move the right arm against resistance and her hand was flaccid. She could not lift the left leg more than an inch off the bed.  She would only say yes, no, and "I'm ok" over and over when asked questions.  She said no she did not want to go to the hospital.  Over the past several years, she's always felt this way.  She has no other new symptoms like cough, fever, urinary changes, diarrhea, nausea or vomiting.  In general, her cognition has declined but she is typically able to say more than these three things.    I called her daughter, April, and son-in-law to inform them of the events.  Pt had awoken with her symptoms so we did not have a TPA window.  She is also on plavix.  She was refusing to be hospitalized and has never wanted to go out to the hospital.  April and her husband agreed that we should keep her here and monitor b/c there were no interventions that could be done acutely at this time and it would be a big adjustment and trauma for her to go out to the hospital considering her cognitive deficits.  She's had difficulty adjusting to her changes in levels of care over the past few years.  She lives in SNF now dependent in adls.   We agreed to check basic labs here to see if she had any reversible conditions that  could be treated.  If so, we would address those at [facility].  I also discussed with them the need to come in so we can meet and complete a MOST form for Mrs. Sebastiano.  They requested one be sent to them for review and nurse manager was doing this.     Past Medical History:  Diagnosis Date  . Abnormality of gait   . Allergic rhinitis   . Anxiety   . Arthritis   . Carotid stenosis, right    asymptomatic  <40%  right  ica  and right eca  . Carpal tunnel syndrome on both sides   . Claudication, intermittent (HCC)   . COPD (chronic obstructive pulmonary  disease) (HCC)   . Diverticulosis of colon   . Fracture, calcaneus closed   . History of esophagitis   . History of hypothyroidism   . Hypertension   . Internal hemorrhoids   . Irritable bowel syndrome 03/11/2010  . PAD (peripheral artery disease) (HCC) monitored by dr Rubye Oaks   moderate  . Peripheral vascular disease (HCC)   . Stroke (HCC)   . Unspecified hereditary and idiopathic peripheral neuropathy   . Urge urinary incontinence    Past Surgical History:  Procedure Laterality Date  . CAROTID ENDARTERECTOMY Left 11-18-2007  . CATARACT EXTRACTION W/ INTRAOCULAR LENS IMPLANT Right 11/2012  . COLONOSCOPY  2005   mild diverticulosis  . CYSTOSCOPY WITH INJECTION N/A 09/06/2013   Procedure: CYSTOSCOPY WITH BOTOX  INJECTION;  Surgeon: Martina Sinner, MD;  Location: [facility];  Service: Urology;  Laterality: N/A;  . DILATION AND CURETTAGE OF UTERUS  1973    Allergies  Allergen Reactions  . Tramadol Nausea Only    Outpatient Encounter Medications as of 09/16/2017  Medication Sig  . acetaminophen (TYLENOL) 500 MG tablet Take 500 mg by mouth 2 (two) times daily.  Marland Kitchen alum & mag hydroxide-simeth (MINTOX) 200-200-20 MG/5ML suspension Take 15 mLs by mouth as needed for indigestion or heartburn. And prn indigestion  . amLODipine (NORVASC) 5 MG tablet Take 10 mg by mouth daily.   Marland Kitchen atorvastatin (LIPITOR) 20 MG tablet Take 1 tablet (20 mg total) by mouth daily at 6 PM.  . clopidogrel (PLAVIX) 75 MG tablet Take 75 mg by mouth daily.   Marland Kitchen docusate sodium (COLACE) 100 MG capsule Take 100 mg by mouth daily.  . eszopiclone (LUNESTA) 1 MG TABS tablet Take 1 mg by mouth at bedtime. Take immediately before bedtime  . HYDROcodone-acetaminophen (NORCO) 7.5-325 MG tablet Take 1 tablet by mouth every 6 (six) hours as needed for moderate pain (back pain). Then 1 tablet by mouth at bedtime  . Menthol (COUGH DROPS) 10 MG LOZG Use as directed 1 lozenge in the mouth or throat as needed.    . Menthol, Topical Analgesic, (BIOFREEZE EX) Apply 1 application topically as needed. To neck and knees and twice daily as needed to any affected joint.   . pantoprazole (PROTONIX) 40 MG tablet Take 40 mg by mouth daily.  Bertram Gala Glycol-Propyl Glycol (SYSTANE) 0.4-0.3 % GEL ophthalmic gel Place 1 application into both eyes 4 (four) times daily.  . polyethylene glycol powder (GLYCOLAX/MIRALAX) powder Take 17 g by mouth every other day. Mix in 8 oz liquid and drink  . senna-docusate (SENNA S) 8.6-50 MG tablet Take 2 tablets by mouth 2 (two) times daily as needed for mild constipation.   . SUPER B COMPLEX/C PO Take 2 tablets by mouth daily.  . [DISCONTINUED] HYDROcodone-acetaminophen (NORCO) 7.5-325  MG tablet Take one tablet by mouth every 6 hours as needed for moderate pain (Patient taking differently: Take 1 tablet by mouth at bedtime. Take one tablet additionally by mouth every 6 hours as needed for moderate pain)   No facility-administered encounter medications on file as of 09/16/2017.     Review of Systems  Unable to perform ROS: Acuity of condition (see hpi for details)    Immunization History  Administered Date(s) Administered  . Influenza Inj Mdck Quad Pf 03/13/2016  . Influenza-Unspecified 03/09/2013, 03/01/2014, 03/08/2015, 03/19/2017  . PPD Test 12/04/2011  . Pneumococcal Conjugate-13 09/12/2014  . Pneumococcal Polysaccharide-23 09/12/2015  . Td 06/03/2016   Pertinent  Health Maintenance Due  Topic Date Due  . INFLUENZA VACCINE  12/24/2017  . PNA vac Low Risk Adult  Completed   Fall Risk  09/16/2017 10/21/2016 09/10/2016 02/18/2016 09/12/2015  Falls in the past year? No Yes Yes No No  Number falls in past yr: - 1 1 - -  Injury with Fall? - No No - -  Comment - - - - -  Risk for fall due to : - - History of fall(s) - -  Follow up - - Education provided - -   Functional Status Survey:    Vitals:   09/16/17 1522  BP: 122/74  Pulse: 89  Resp: 16  Temp: 98.8 F (37.1  C)  TempSrc: Oral  SpO2: 90%  Weight: 148 lb (67.1 kg)  Height: 5\' 5"  (1.651 m)   Body mass index is 24.63 kg/m. Physical Exam  Constitutional: She appears well-developed. No distress.  HENT:  Dentures not in so speech slurred  Eyes: Pupils are equal, round, and reactive to light. EOM are normal.  Cardiovascular:  Murmur heard. irreg irreg  Pulmonary/Chest: Effort normal and breath sounds normal. No respiratory distress.  Abdominal: Soft. Bowel sounds are normal.  Musculoskeletal: She exhibits no tenderness.  See neuro  Neurological: She exhibits abnormal muscle tone. Coordination abnormal.  Keeps closing her eyes, slight tremulousness of left side; 2-3/5 strength of LUE, 2/5 LLE, 4+/5 on right side  Skin: Skin is warm and dry. There is pallor.    Labs reviewed: Recent Labs    11/17/16  NA 139  K 4.0  BUN 27*  CREATININE 0.8   No results for input(s): AST, ALT, ALKPHOS, BILITOT, PROT, ALBUMIN in the last 8760 hours. No results for input(s): WBC, NEUTROABS, HGB, HCT, MCV, PLT in the last 8760 hours. Lab Results  Component Value Date   TSH 3.16 09/20/2015   Lab Results  Component Value Date   HGBA1C 6.0 (H) 10/14/2015   Lab Results  Component Value Date   CHOL 166 11/17/2016   HDL 106 (A) 11/17/2016   LDLCALC 53 11/17/2016   TRIG 36 (A) 11/17/2016   CHOLHDL 3.2 10/14/2015    Assessment/Plan 1. Acute cerebrovascular accident (CVA) (HCC) -with left sided weakness, lethargy -not in tpa window -further imaging and eval will not change mgt -r/o any acute reversible pathology with labs -keep at [facility] for comfort care and monitor -has had some improvment  2. Advance care planning Last ACP Note 06/20/2017 to 09/18/2017       ACP (Advance Care Planning) by [name], DO at 09/16/2017  3:19 PM    Date of Service   Author Author Type Status Note Type File Time  09/16/2017 Addend [name], DO Physician Signed ACP (Advance Care Planning) 09/18/2017  I called her daughter, April, and son-in-law to inform them of the events.  Pt had awoken with her symptoms so we did not have a TPA window.  She is also on plavix.  She was refusing to be hospitalized and has never wanted to go out to the hospital.  April and her husband agreed that we should keep her here and monitor b/c there were no interventions that could be done acutely at this time and it would be a big adjustment and trauma for her to go out to the hospital considering her cognitive deficits.  She's had difficulty adjusting to her changes in levels of care over the past few years.  She lives in SNF now dependent in adls.   We agreed to check basic labs here to see if she had any reversible conditions that could be treated.  If so, we would address those at [facility].  I also discussed with them the need to come in so we can meet and complete a MOST form for Mrs. Dodgen.  They requested one be sent to them for review and nurse manager was doing this.   Has DNR, living will and HCPOA on file.  Needs MOST.  Discussed with April Drisana Schweickert and her husband.    25 mins spent on ACP.                 3. Mixed Alzheimer's and vascular dementia -progressing and now requires snf level of care  4.  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

## 2017-09-18 NOTE — ACP (Advance Care Planning) (Signed)
I called her daughter, Tara Walters, and son-in-law to inform them of the events.  Pt had awoken with her symptoms so we did not have a TPA window.  She is also on plavix.  She was refusing to be hospitalized and has never wanted to go out to the hospital.  Tara Walters and her husband agreed that we should keep her here and monitor b/c there were no interventions that could be done acutely at this time and it would be a big adjustment and trauma for her to go out to the hospital considering her cognitive deficits.  She's had difficulty adjusting to her changes in levels of care over the past few years.  She lives in SNF now dependent in adls.   We agreed to check basic labs here to see if she had any reversible conditions that could be treated.  If so, we would address those at [facility].  I also discussed with them the need to come in so we can meet and complete a MOST form for Tara Walters.  They requested one be sent to them for review and nurse manager was doing this.   Has DNR, living will and HCPOA on file.  Needs MOST.  Discussed with Tara Walters Tara Walters and her husband.    25 mins spent on ACP.

## 2017-10-05 ENCOUNTER — Non-Acute Institutional Stay (SKILLED_NURSING_FACILITY): Payer: Medicare Other | Admitting: Adult Health

## 2017-10-05 ENCOUNTER — Encounter: Payer: Self-pay | Admitting: Adult Health

## 2017-10-05 DIAGNOSIS — F0280 Dementia in other diseases classified elsewhere without behavioral disturbance: Secondary | ICD-10-CM

## 2017-10-05 DIAGNOSIS — I679 Cerebrovascular disease, unspecified: Secondary | ICD-10-CM

## 2017-10-05 DIAGNOSIS — F0150 Vascular dementia without behavioral disturbance: Secondary | ICD-10-CM | POA: Diagnosis not present

## 2017-10-05 DIAGNOSIS — I6522 Occlusion and stenosis of left carotid artery: Secondary | ICD-10-CM | POA: Diagnosis not present

## 2017-10-05 DIAGNOSIS — G309 Alzheimer's disease, unspecified: Secondary | ICD-10-CM | POA: Diagnosis not present

## 2017-10-05 DIAGNOSIS — R269 Unspecified abnormalities of gait and mobility: Secondary | ICD-10-CM | POA: Diagnosis not present

## 2017-10-05 DIAGNOSIS — I739 Peripheral vascular disease, unspecified: Secondary | ICD-10-CM | POA: Diagnosis not present

## 2017-10-05 NOTE — Assessment & Plan Note (Signed)
Progressive decline in functional status but remains fairly functional cognitively.

## 2017-10-05 NOTE — Progress Notes (Signed)
Location:  Medical illustrator of Service:  SNF (31) Provider:   Peggye Ley, ANP [facility] [phone]   [name], DO  Patient Care Team: [name], DO as PCP - General (Geriatric Medicine) [name], [name], NP as Nurse Practitioner (Nurse Practitioner) Marvel Plan, MD as Consulting Physician (Neurology)  Extended Emergency Contact Information Primary Emergency Contact: [name] [place] [address] [address] [place] Home Phone: [phone] Mobile Phone: [phone] Relation: Daughter Secondary Emergency Contact: Reed,Tilden  [place] Home Phone: [phone] Mobile Phone: [phone] Relation: Relative  Code Status:  DNR Goals of care: Advanced Directive information Advanced Directives 09/16/2017  Does Patient Have a Medical Advance Directive? Yes  Type of Estate agent of [place];Living will;Out of facility DNR (pink MOST or yellow form)  Does patient want to make changes to medical advance directive? No - Patient declined  Copy of Healthcare Power of Attorney in Chart? Yes  Pre-existing out of facility DNR order (yellow form or pink MOST form) Yellow form placed in chart (order not valid for inpatient use)     Chief Complaint  Patient presents with  . Medical Management of Chronic Issues    HPI:  Pt is a 84 y.o. female seen today for medical management of chronic diseases.  She was seen in April by Dr. Renato Gails due to left sided weakness. It was felt at that time she was having another CVA.  She was not sent to the hospital due to her health status, goals of care, and lack of benefit. She has improved and seems to be close to baseline cognitively, only slightly weaker and requiring the stand up lift for transfers consistently. She has left leg weakness from a previous CVA that prevents stable transfers.   Carotid stenosis with Left CEA  2009  PAD: No pain reported  ABI: 08/2016 Right: 0.69 (0.61, 08-15-15); waveforms: PT: biphasic; DP: monophasic; TBI: 0.49 Left: 0.63 (0.69, 08-15-15); waveforms: biphasic; TBI: 0.43 ABI's remain stable with moderate arterial occlusive disease bilaterally, bi and monophasic waveforms.   GERD: has indigestion occasionally after a large meal but mostly feels her current regimen is helping      Past Medical History:  Diagnosis Date  . Abnormality of gait   . Allergic rhinitis   . Anxiety   . Arthritis   . Carotid stenosis, right    asymptomatic  <40%  right  ica  and right eca  . Carpal tunnel syndrome on both sides   . Claudication, intermittent (HCC)   . COPD (chronic obstructive pulmonary disease) (HCC)   . Diverticulosis of colon   . Fracture, calcaneus closed   . History of esophagitis   . History of hypothyroidism   . Hypertension   . Internal hemorrhoids   . Irritable bowel syndrome 03/11/2010  . PAD (peripheral artery disease) (HCC) monitored by dr Rubye Oaks   moderate  . Peripheral vascular disease (HCC)   . Stroke (HCC)   . Unspecified hereditary and idiopathic peripheral neuropathy   . Urge urinary incontinence    Past Surgical History:  Procedure Laterality Date  . CAROTID ENDARTERECTOMY Left 11-18-2007  . CATARACT EXTRACTION W/ INTRAOCULAR LENS IMPLANT Right 11/2012  . COLONOSCOPY  2005   mild diverticulosis  . CYSTOSCOPY WITH INJECTION N/A 09/06/2013   Procedure: CYSTOSCOPY WITH BOTOX  INJECTION;  Surgeon: Martina Sinner, MD;  Location: [facility];  Service: Urology;  Laterality: N/A;  . DILATION AND CURETTAGE OF UTERUS  1973    Allergies  Allergen Reactions  . Tramadol Nausea Only    Outpatient Encounter Medications as of 10/05/2017  Medication Sig  . acetaminophen (TYLENOL) 500 MG tablet Take 500 mg by mouth 2 (two) times daily.  Marland Kitchen alum & mag hydroxide-simeth (MINTOX) 200-200-20 MG/5ML suspension Take 15 mLs by mouth as needed for  indigestion or heartburn. And prn indigestion  . amLODipine (NORVASC) 5 MG tablet Take 10 mg by mouth daily.   Marland Kitchen atorvastatin (LIPITOR) 20 MG tablet Take 1 tablet (20 mg total) by mouth daily at 6 PM.  . clopidogrel (PLAVIX) 75 MG tablet Take 75 mg by mouth daily.   Marland Kitchen docusate sodium (COLACE) 100 MG capsule Take 100 mg by mouth daily.  . eszopiclone (LUNESTA) 1 MG TABS tablet Take 1 mg by mouth at bedtime. Take immediately before bedtime  . HYDROcodone-acetaminophen (NORCO) 7.5-325 MG tablet Take 1 tablet by mouth every 6 (six) hours as needed for moderate pain (back pain). Then 1 tablet by mouth at bedtime  . Menthol (COUGH DROPS) 10 MG LOZG Use as directed 1 lozenge in the mouth or throat as needed.  . Menthol, Topical Analgesic, (BIOFREEZE EX) Apply 1 application topically as needed. To neck and knees and twice daily as needed to any affected joint.   . pantoprazole (PROTONIX) 40 MG tablet Take 40 mg by mouth daily.  Bertram Gala Glycol-Propyl Glycol (SYSTANE) 0.4-0.3 % GEL ophthalmic gel Place 1 application into both eyes 4 (four) times daily.  . polyethylene glycol powder (GLYCOLAX/MIRALAX) powder Take 17 g by mouth every other day. Mix in 8 oz liquid and drink  . senna-docusate (SENNA S) 8.6-50 MG tablet Take 2 tablets by mouth 2 (two) times daily as needed for mild constipation.   . SUPER B COMPLEX/C PO Take 2 tablets by mouth daily.   No facility-administered encounter medications on file as of 10/05/2017.     Review of Systems  Constitutional: Positive for activity change. Negative for appetite change, chills, diaphoresis, fatigue, fever and unexpected weight change.  HENT: Negative for congestion.   Respiratory: Positive for cough (chronic). Negative for shortness of breath and wheezing.   Cardiovascular: Negative for chest pain, palpitations and leg swelling.  Gastrointestinal: Negative for abdominal distention, abdominal pain, constipation and diarrhea.  Genitourinary: Negative for  difficulty urinating and dysuria.  Musculoskeletal: Positive for arthralgias and gait problem. Negative for back pain, joint swelling and myalgias.  Skin: Negative for wound.  Neurological: Positive for weakness. Negative for dizziness, tremors, seizures, syncope, facial asymmetry, speech difficulty, light-headedness, numbness and headaches.  Psychiatric/Behavioral: Negative for agitation, behavioral problems and confusion.       Memory loss    Immunization History  Administered Date(s) Administered  . Influenza Inj Mdck Quad Pf 03/13/2016  . Influenza-Unspecified 03/09/2013, 03/01/2014, 03/08/2015, 03/19/2017  . PPD Test 12/04/2011  . Pneumococcal Conjugate-13 09/12/2014  . Pneumococcal Polysaccharide-23 09/12/2015  . Td 06/03/2016   Pertinent  Health Maintenance Due  Topic Date Due  . INFLUENZA VACCINE  12/24/2017  . PNA vac Low Risk Adult  Completed   Fall Risk  09/16/2017 10/21/2016 09/10/2016 02/18/2016 09/12/2015  Falls in the past year? No Yes Yes No No  Number falls in past yr: - 1 1 - -  Injury with Fall? - No No - -  Comment - - - - -  Risk for fall due to : - - History of  fall(s) - -  Follow up - - Education provided - -   Functional Status Survey:    Vitals:   10/05/17 0948  Weight: 169 lb 1.6 oz (76.7 kg)   Body mass index is 28.14 kg/m. Physical Exam  Constitutional: She is oriented to person, place, and time. No distress.  HENT:  Head: Normocephalic and atraumatic.  Mouth/Throat: No oropharyngeal exudate.  Neck: No JVD present.  Cardiovascular: Normal rate and regular rhythm.  No murmur heard. Pulmonary/Chest: Effort normal. No respiratory distress. She has no wheezes. She has rales (bibasilar).  Scattered rhonchi bilat  Abdominal: Soft. Bowel sounds are normal. She exhibits no distension. There is no tenderness.  Musculoskeletal: She exhibits no edema or tenderness.  Neurological: She is alert and oriented to person, place, and time.  LLE 3/5, RLE 4/5  BUE 4/5  Skin: Skin is warm and dry. She is not diaphoretic.  Psychiatric: She has a normal mood and affect.  Nursing note and vitals reviewed.   Labs reviewed: Recent Labs    11/17/16 09/16/17  NA 139 141  K 4.0 4.5  BUN 27* 26*  CREATININE 0.8 0.7   No results for input(s): AST, ALT, ALKPHOS, BILITOT, PROT, ALBUMIN in the last 8760 hours. Recent Labs    09/16/17  WBC 18.3  HGB 11.5*  HCT 36  PLT 182   Lab Results  Component Value Date   TSH 3.16 09/20/2015   Lab Results  Component Value Date   HGBA1C 6.0 (H) 10/14/2015   Lab Results  Component Value Date   CHOL 166 11/17/2016   HDL 106 (A) 11/17/2016   LDLCALC 53 11/17/2016   TRIG 36 (A) 11/17/2016   CHOLHDL 3.2 10/14/2015    Significant Diagnostic Results in last 30 days:  No results found.  Assessment/Plan  Cerebrovascular disease Continue statin and plavix.   Mixed Alzheimer's and vascular dementia Progressive decline in functional status but remains fairly functional cognitively.   PVD (peripheral vascular disease) with claudication (HCC) Given her goals of care and functional decline,  would not pursue additional testing.   Gait disorder Due to neuropathy, left leg weakness  Use stand up lift for transfers.   Carotid stenosis Hx of left CEA 10/2007.  See above.    Her most form has not been returned indicating no hospitalization. The nurse will call her daughter and follow up regarding this issue.    Family/ staff Communication: staff/resident  Labs/tests ordered:  NA

## 2017-10-05 NOTE — Assessment & Plan Note (Signed)
Continue statin and plavix 

## 2017-10-05 NOTE — Assessment & Plan Note (Signed)
Given her goals of care and functional decline,  would not pursue additional testing.

## 2017-10-05 NOTE — Assessment & Plan Note (Signed)
Due to neuropathy, left leg weakness  Use stand up lift for transfers.

## 2017-10-05 NOTE — Assessment & Plan Note (Signed)
Hx of left CEA 10/2007.  See above.

## 2017-10-29 ENCOUNTER — Non-Acute Institutional Stay (SKILLED_NURSING_FACILITY): Payer: Medicare Other | Admitting: Adult Health

## 2017-10-29 DIAGNOSIS — I10 Essential (primary) hypertension: Secondary | ICD-10-CM | POA: Diagnosis not present

## 2017-10-29 DIAGNOSIS — K5903 Drug induced constipation: Secondary | ICD-10-CM | POA: Diagnosis not present

## 2017-10-29 DIAGNOSIS — F0280 Dementia in other diseases classified elsewhere without behavioral disturbance: Secondary | ICD-10-CM

## 2017-10-29 DIAGNOSIS — M159 Polyosteoarthritis, unspecified: Secondary | ICD-10-CM

## 2017-10-29 DIAGNOSIS — G309 Alzheimer's disease, unspecified: Secondary | ICD-10-CM

## 2017-10-29 DIAGNOSIS — F5101 Primary insomnia: Secondary | ICD-10-CM | POA: Diagnosis not present

## 2017-10-29 DIAGNOSIS — F0150 Vascular dementia without behavioral disturbance: Secondary | ICD-10-CM

## 2017-11-09 ENCOUNTER — Encounter: Payer: Self-pay | Admitting: Adult Health

## 2017-11-09 NOTE — Progress Notes (Signed)
Location:  Medical illustrator of Service:  SNF (31) Provider:   Peggye Ley, ANP [facility] [phone]   [name], DO  Patient Care Team: [name], DO as PCP - General (Geriatric Medicine) [facility] [name], [name], NP as Nurse Practitioner (Nurse Practitioner) Marvel Plan, MD as Consulting Physician (Neurology)  Extended Emergency Contact Information Primary Emergency Contact: [name] [place] [address] [address] [place] Home Phone: [phone] Mobile Phone: [phone] Relation: Daughter Secondary Emergency Contact: Reed,T[place]en  [place] Home Phone: [phone] Mobile Phone: [phone] Relation: Relative  Code Status: DNR Goals of care: Advanced Directive information Advanced Directives 09/16/2017  Does Patient Have a Medical Advance Directive? Yes  Type of Estate agent of [place];Living will;Out of facility DNR (pink MOST or yellow form)  Does patient want to make changes to medical advance directive? No - Patient declined  Copy of Healthcare Power of Attorney in Chart? Yes  Pre-existing out of facility DNR order (yellow form or pink MOST form) Yellow form placed in chart (order not valid for inpatient use)     Chief Complaint  Patient presents with  . Medical Management of Chronic Issues    HPI:  Pt is a 84 y.o. female seen today for medical management of chronic diseases.   There are no complaints regarding her care.   She had a probable acute CVA in April but has recovered and close to her baseline.  She is now using a stand up lift regularly for transfers.   She continues to have neck, knee, and shoulder pain and uses norco each night for this reason. The pain is chronic due to OA and unchanged by her account. She does not like to use creams.   Mixed AD and vascular dementia: remains verbal, pleasant, without agitation.    MMSE dropped two points in the past year from 20 to 18  Sleeps well most of the time with Lunesta 8 hrs per night  Constipation: reports regular bms on colace, miralax, and senokot  BP controlled  Past Medical History:  Diagnosis Date  . Abnormality of gait   . Allergic rhinitis   . Anxiety   . Arthritis   . Carotid stenosis, right    asymptomatic  <40%  right  ica  and right eca  . Carpal tunnel syndrome on both sides   . Claudication, intermittent (HCC)   . COPD (chronic obstructive pulmonary disease) (HCC)   . Diverticulosis of colon   . Fracture, calcaneus closed   . History of esophagitis   . History of hypothyroidism   . Hypertension   . Internal hemorrhoids   . Irritable bowel syndrome 03/11/2010  . PAD (peripheral artery disease) (HCC) monitored by dr Rubye Oaks   moderate  . Peripheral vascular disease (HCC)   . Stroke (HCC)   . Unspecified hereditary and idiopathic peripheral neuropathy   . Urge urinary incontinence    Past Surgical History:  Procedure Laterality Date  . CAROTID ENDARTERECTOMY Left 11-18-2007  . CATARACT EXTRACTION W/ INTRAOCULAR LENS IMPLANT Right 11/2012  . COLONOSCOPY  2005   mild diverticulosis  . CYSTOSCOPY WITH INJECTION N/A 09/06/2013   Procedure: CYSTOSCOPY WITH BOTOX  INJECTION;  Surgeon: Martina Sinner, MD;  Location: [facility];  Service: Urology;  Laterality: N/A;  . DILATION AND CURETTAGE OF UTERUS  1973    Allergies  Allergen Reactions  .  Tramadol Nausea Only    Outpatient Encounter Medications as of 10/29/2017  Medication Sig  . acetaminophen (TYLENOL) 500 MG tablet Take 500 mg by mouth 2 (two) times daily.  Marland Kitchen. alum & mag hydroxide-simeth (MINTOX) 200-200-20 MG/5ML suspension Take 15 mLs by mouth as needed for indigestion or heartburn. And prn indigestion  . amLODipine (NORVASC) 5 MG tablet Take 10 mg by mouth daily.   Marland Kitchen. atorvastatin (LIPITOR) 20 MG tablet Take 1 tablet (20 mg total) by mouth daily at 6  PM.  . clopidogrel (PLAVIX) 75 MG tablet Take 75 mg by mouth daily.   Marland Kitchen. docusate sodium (COLACE) 100 MG capsule Take 100 mg by mouth daily.  . eszopiclone (LUNESTA) 1 MG TABS tablet Take 1 mg by mouth at bedtime. Take immediately before bedtime  . HYDROcodone-acetaminophen (NORCO) 7.5-325 MG tablet Take 1 tablet by mouth every 6 (six) hours as needed for moderate pain (back pain). Then 1 tablet by mouth at bedtime  . Menthol (COUGH DROPS) 10 MG LOZG Use as directed 1 lozenge in the mouth or throat as needed.  . pantoprazole (PROTONIX) 40 MG tablet Take 40 mg by mouth daily.  Bertram Gala. Polyethyl Glycol-Propyl Glycol (SYSTANE) 0.4-0.3 % GEL ophthalmic gel Place 1 application into both eyes 4 (four) times daily.  . polyethylene glycol powder (GLYCOLAX/MIRALAX) powder Take 17 g by mouth every other day. Mix in 8 oz liquid and drink  . senna-docusate (SENNA S) 8.6-50 MG tablet Take 2 tablets by mouth 2 (two) times daily as needed for mild constipation.   . SUPER B COMPLEX/C PO Take 2 tablets by mouth daily.  . [DISCONTINUED] Menthol, Topical Analgesic, (BIOFREEZE EX) Apply 1 application topically as needed. To neck and knees and twice daily as needed to any affected joint.    No facility-administered encounter medications on file as of 10/29/2017.     Review of Systems  Constitutional: Negative for activity change, appetite change, chills, diaphoresis, fatigue, fever and unexpected weight change.  HENT: Negative for congestion.   Respiratory: Negative for cough, shortness of breath and wheezing.   Cardiovascular: Negative for chest pain, palpitations and leg swelling.  Gastrointestinal: Positive for constipation. Negative for abdominal distention, abdominal pain and diarrhea.  Genitourinary: Negative for difficulty urinating and dysuria.  Musculoskeletal: Positive for arthralgias, back pain and gait problem. Negative for joint swelling and myalgias.  Neurological: Positive for weakness. Negative for  dizziness, tremors, seizures, syncope, facial asymmetry, speech difficulty, light-headedness, numbness and headaches.  Psychiatric/Behavioral: Positive for confusion. Negative for agitation and behavioral problems.    Immunization History  Administered Date(s) Administered  . Influenza Inj Mdck Quad Pf 03/13/2016  . Influenza-Unspecified 03/09/2013, 03/01/2014, 03/08/2015, 03/19/2017  . PPD Test 12/04/2011  . Pneumococcal Conjugate-13 09/12/2014  . Pneumococcal Polysaccharide-23 09/12/2015  . Td 06/03/2016   Pertinent  Health Maintenance Due  Topic Date Due  . INFLUENZA VACCINE  12/24/2017  . PNA vac Low Risk Adult  Completed   Fall Risk  09/16/2017 10/21/2016 09/10/2016 02/18/2016 09/12/2015  Falls in the past year? No Yes Yes No No  Number falls in past yr: - 1 1 - -  Injury with Fall? - No No - -  Comment - - - - -  Risk for fall due to : - - History of fall(s) - -  Follow up - - Education provided - -   Functional Status Survey:    Vitals:   11/09/17 1618  Weight: 166 lb 9.6 oz (75.6 kg)   Body mass index  is 27.72 kg/m. Physical Exam  Constitutional: No distress.  HENT:  Head: Normocephalic and atraumatic.  Right Ear: External ear normal.  Left Ear: External ear normal.  Mouth/Throat: No oropharyngeal exudate.  Neck: No JVD present. No thyromegaly present.  Cardiovascular: Regular rhythm.  No murmur heard. Pulmonary/Chest: Effort normal and breath sounds normal. No respiratory distress.  Abdominal: Soft. Bowel sounds are normal.  Musculoskeletal: She exhibits no edema or tenderness.  Kyphotic posture. Strength 3/5 to LLE. BUE 4/5. RLE 4/5  Lymphadenopathy:    She has no cervical adenopathy.  Neurological: She is alert.  Oriented x 2.   Skin: Skin is warm and dry. She is not diaphoretic.  Psychiatric: She has a normal mood and affect.    Labs reviewed: Recent Labs    11/17/16 09/16/17  NA 139 141  K 4.0 4.5  BUN 27* 26*  CREATININE 0.8 0.7   Recent Labs     09/16/17  AST 20  ALT 19  ALKPHOS 86   Recent Labs    09/16/17  WBC 18.3  HGB 11.5*  HCT 36  PLT 182   Lab Results  Component Value Date   TSH 3.16 09/20/2015   Lab Results  Component Value Date   HGBA1C 6.0 (H) 10/14/2015   Lab Results  Component Value Date   CHOL 166 11/17/2016   HDL 106 (A) 11/17/2016   LDLCALC 53 11/17/2016   TRIG 36 (A) 11/17/2016   CHOLHDL 3.2 10/14/2015    Significant Diagnostic Results in last 30 days:  No results found.  Assessment/Plan  HTN (hypertension) Controlled, continue Norvasc 10 mg qd  Generalized osteoarthritis of multiple sites Continues with moderate level of pain, would not taper Norco  Constipation Continue Miralax, colace, and senokot s  Insomnia Controlled, continue Lunesta. She does not wish to taper this medication as she wishes that she could sleep longer each night. I let her know that at her age 66 hrs is adequate.   Mixed Alzheimer's and vascular dementia Slow and progressive decline consistent with the disease. Despite recent cerebrovascular event she remains alert and communicative. Likely would not benefit from memory medications.    Family/ staff Communication: staff/resident  Labs/tests ordered:  NA

## 2017-11-09 NOTE — Assessment & Plan Note (Signed)
Slow and progressive decline consistent with the disease. Despite recent cerebrovascular event she remains alert and communicative. Likely would not benefit from memory medications.

## 2017-11-09 NOTE — Assessment & Plan Note (Signed)
Continue Miralax, colace, and senokot s

## 2017-11-09 NOTE — Assessment & Plan Note (Signed)
Continues with moderate level of pain, would not taper Norco

## 2017-11-09 NOTE — Assessment & Plan Note (Signed)
Controlled, continue Lunesta. She does not wish to taper this medication as she wishes that she could sleep longer each night. I let her know that at her age 84 hrs is adequate.

## 2017-11-09 NOTE — Assessment & Plan Note (Signed)
Controlled, continue Norvasc 10 mg qd

## 2017-11-24 ENCOUNTER — Other Ambulatory Visit: Payer: Self-pay | Admitting: *Deleted

## 2017-11-24 NOTE — Telephone Encounter (Signed)
Ok to refill? We have not filled this for her before and there is nothing in [employer].

## 2017-11-24 NOTE — Telephone Encounter (Signed)
She lives in skilled care so we will take care of it here at [facility] on paper.  Thanks.

## 2017-11-25 NOTE — Telephone Encounter (Signed)
Ok. Thanks!

## 2017-12-01 ENCOUNTER — Encounter: Payer: Self-pay | Admitting: Internal Medicine

## 2017-12-01 ENCOUNTER — Non-Acute Institutional Stay (SKILLED_NURSING_FACILITY): Payer: Medicare Other | Admitting: Internal Medicine

## 2017-12-01 DIAGNOSIS — F0150 Vascular dementia without behavioral disturbance: Secondary | ICD-10-CM

## 2017-12-01 DIAGNOSIS — I679 Cerebrovascular disease, unspecified: Secondary | ICD-10-CM | POA: Diagnosis not present

## 2017-12-01 DIAGNOSIS — K5903 Drug induced constipation: Secondary | ICD-10-CM | POA: Diagnosis not present

## 2017-12-01 DIAGNOSIS — F5101 Primary insomnia: Secondary | ICD-10-CM | POA: Diagnosis not present

## 2017-12-01 DIAGNOSIS — M159 Polyosteoarthritis, unspecified: Secondary | ICD-10-CM

## 2017-12-01 DIAGNOSIS — G309 Alzheimer's disease, unspecified: Secondary | ICD-10-CM | POA: Diagnosis not present

## 2017-12-01 DIAGNOSIS — I10 Essential (primary) hypertension: Secondary | ICD-10-CM | POA: Diagnosis not present

## 2017-12-01 DIAGNOSIS — Z0000 Encounter for general adult medical examination without abnormal findings: Secondary | ICD-10-CM

## 2017-12-01 DIAGNOSIS — F0280 Dementia in other diseases classified elsewhere without behavioral disturbance: Secondary | ICD-10-CM | POA: Diagnosis not present

## 2017-12-01 NOTE — Progress Notes (Signed)
Patient ID: Tara Walters, female   DOB: Oct 17, 1921, 84 y.o.   MRN: [id number]  Provider:  Gwenith Spitz. Renato Gails, D.O., C.M.D. Location: [facility] Room Number: 130 Place of Service:  SNF (31)   PCP: [name], DO Patient Care Team: [name], DO as PCP - General (Geriatric Medicine) [facility] Merrilee Jansky, NP as Nurse Practitioner (Nurse Practitioner) Marvel Plan, MD as Consulting Physician (Neurology)  Extended Emergency Contact Information Primary Emergency Contact: [name] [place] [address] [address] [place] Home Phone: [phone] Mobile Phone: [phone] Relation: Daughter Secondary Emergency Contact: Winn Muehl,[place]  [place] Home Phone: [phone] Mobile Phone: [phone] Relation: Relative  Code Status: DNR, comfort care Goals of Care: Advanced Directive information Advanced Directives 12/01/2017  Does Patient Have a Medical Advance Directive? Yes  Type of Estate agent of [place];Living will;Out of facility DNR (pink MOST or yellow form)  Does patient want to make changes to medical advance directive? No - Patient declined  Copy of Healthcare Power of Attorney in Chart? Yes  Pre-existing out of facility DNR order (yellow form or pink MOST form) Yellow form placed in chart (order not valid for inpatient use)     Chief Complaint  Patient presents with  . Annual Exam    CPE    HPI: Patient is a 84 y.o. female seen today for an annual comprehensive examination.  Pt seemed to have had another stroke in April.  Discussion with pt and her family indicated she did not want to be hospitalized.  She is nearly back to her baseline. She does require a lift for transfers when previously she could do this with personal assistance and her walker.  She has chronic osteoarthritis pains that persist and require hydrocodone for relief.  Uses heat for her  back at bedtime with benefit.  Neck also aches.  Topicals don't seem to help.  Dementia continues to gradually progress with worsening memory, but no behaviors, hallucinations, delusions.  Bowels move regularly with her colace, miralax and senokot s.    BPs are at goal for her secondary stroke prevention.   She is on plavix and lipitor also for secondary stroke prevention.  She does not sleep if she does not take her lunesta which she's been on for many years now. Other meds have been tried and they have not been effective for her.  She is up to date on vaccinations except shingrix.  She gets her annual flu shot.  She no longer requires other health maintenance at her age.  Weight has actually trended up.  She is not requiring a modified diet.  Past Medical History:  Diagnosis Date  . Abnormality of gait   . Allergic rhinitis   . Anxiety   . Arthritis   . Carotid stenosis, right    asymptomatic  <40%  right  ica  and right eca  . Carpal tunnel syndrome on both sides   . Claudication, intermittent (HCC)   . COPD (chronic obstructive pulmonary disease) (HCC)   . Diverticulosis of colon   . Fracture, calcaneus closed   . History of esophagitis   . History of hypothyroidism   . Hypertension   . Internal hemorrhoids   . Irritable bowel syndrome 03/11/2010  . PAD (peripheral artery disease) (HCC) monitored by dr Rubye Oaks   moderate  . Peripheral vascular disease (HCC)   . Stroke (HCC)   .  Unspecified hereditary and idiopathic peripheral neuropathy   . Urge urinary incontinence    Past Surgical History:  Procedure Laterality Date  . CAROTID ENDARTERECTOMY Left 11-18-2007  . CATARACT EXTRACTION W/ INTRAOCULAR LENS IMPLANT Right 11/2012  . COLONOSCOPY  2005   mild diverticulosis  . CYSTOSCOPY WITH INJECTION N/A 09/06/2013   Procedure: CYSTOSCOPY WITH BOTOX  INJECTION;  Surgeon: Martina SinnerScott A MacDiarmid, MD;  [facility];  Service: Urology;  Laterality: N/A;    . DILATION AND CURETTAGE OF UTERUS  1973    reports that she quit smoking about 34 years ago. Her smoking use included cigarettes. She has a 40.00 pack-year smoking history. She has never used smokeless tobacco. She reports that she does not drink alcohol or use drugs. Social History   Socioeconomic History  . Marital status: Widowed    Spouse name: Not on file  . Number of children: Not on file  . Years of education: Not on file  . Highest education level: Not on file  Occupational History  . Occupation: retired Chief Operating Officerbuyer  Social Needs  . Financial resource strain: Not hard at all  . Food insecurity:    Worry: Never true    Inability: Never true  . Transportation needs:    Medical: No    Non-medical: No  Tobacco Use  . Smoking status: Former Smoker    Packs/day: 1.00    Years: 40.00    Pack years: 40.00    Types: Cigarettes    Last attempt to quit: 05/27/1983    Years since quitting: 34.6  . Smokeless tobacco: Never Used  Substance and Sexual Activity  . Alcohol use: No    Alcohol/week: 0.0 oz  . Drug use: No  . Sexual activity: Never  Lifestyle  . Physical activity:    Days per week: 0 days    Minutes per session: 0 min  . Stress: Not at all  Relationships  . Social connections:    Talks on phone: Twice a week    Gets together: Twice a week    Attends religious service: Never    Active member of club or organization: No    Attends meetings of clubs or organizations: Never    Relationship status: Widowed  Other Topics Concern  . Not on file  Social History Narrative   Widowed, Retired Production managerbuyer.  Patient [facility],[place] [place].     Stop smoking 1985, previously smoked one pack a day for about 50 years   . Minimal  Alcohol history   Patient has  Advanced planning documents: Living Will, DNR, POA   No brothers or sisters. No children, five stepchildren.     Walks with walker            Family History  Problem Relation Age of Onset  . Heart attack Mother   . Cancer Father        Leukemia  . Diabetes Father     Pertinent  Health Maintenance Due  Topic Date Due  . INFLUENZA VACCINE  12/24/2017  . PNA vac Low Risk Adult  Completed   Fall Risk  09/16/2017 10/21/2016 09/10/2016 02/18/2016 09/12/2015  Falls in the past year? No Yes Yes No No  Number falls in past yr: - 1 1 - -  Injury with Fall? - No No - -  Comment - - - - -  Risk for  fall due to : - - History of fall(s) - -  Follow up - - Education provided - -   Depression screen [facility] 2/9 09/16/2017 09/10/2016 02/18/2016 09/12/2015 03/21/2015  Decreased Interest 0 0 0 0 0  Down, Depressed, Hopeless 0 0 0 0 0  PHQ - 2 Score 0 0 0 0 0    Functional Status Survey:    Allergies  Allergen Reactions  . Tramadol Nausea Only    Outpatient Encounter Medications as of 12/01/2017  Medication Sig  . acetaminophen (TYLENOL) 500 MG tablet Take 500 mg by mouth 2 (two) times daily.  Marland Kitchen alum & mag hydroxide-simeth (MINTOX) 200-200-20 MG/5ML suspension Take 15 mLs by mouth as needed for indigestion or heartburn. And prn indigestion  . amLODipine (NORVASC) 5 MG tablet Take 10 mg by mouth daily.   Marland Kitchen atorvastatin (LIPITOR) 20 MG tablet Take 1 tablet (20 mg total) by mouth daily at 6 PM.  . clopidogrel (PLAVIX) 75 MG tablet Take 75 mg by mouth daily.   Marland Kitchen docusate sodium (COLACE) 100 MG capsule Take 100 mg by mouth daily.  . eszopiclone (LUNESTA) 1 MG TABS tablet Take 1 mg by mouth at bedtime. Take immediately before bedtime  . HYDROcodone-acetaminophen (NORCO) 7.5-325 MG tablet Take 1 tablet by mouth every 6 (six) hours as needed for moderate pain (back pain). Then 1 tablet by mouth at bedtime  . Menthol (COUGH DROPS) 10 MG LOZG Use as directed 1 lozenge in the mouth or throat as needed.  . pantoprazole (PROTONIX) 40 MG tablet Take 40 mg by mouth daily.  Bertram Gala Glycol-Propyl Glycol (SYSTANE) 0.4-0.3 %  GEL ophthalmic gel Place 1 application into both eyes 4 (four) times daily.  . polyethylene glycol powder (GLYCOLAX/MIRALAX) powder Take 17 g by mouth every other day. Mix in 8 oz liquid and drink  . senna-docusate (SENNA S) 8.6-50 MG tablet Take 2 tablets by mouth 2 (two) times daily as needed for mild constipation.   . SUPER B COMPLEX/C PO Take 2 tablets by mouth daily.   No facility-administered encounter medications on file as of 12/01/2017.     Review of Systems  Constitutional: Negative for activity change, appetite change, chills, fever and unexpected weight change.  HENT: Positive for hearing loss. Negative for congestion.   Eyes:       Declining vision  Respiratory: Negative for chest tightness and shortness of breath.   Cardiovascular: Negative for chest pain, palpitations and leg swelling.  Gastrointestinal: Positive for constipation. Negative for abdominal pain, blood in stool, diarrhea and nausea.  Genitourinary: Negative for dysuria.       Urinary incontinence  Musculoskeletal: Positive for arthralgias, back pain, gait problem, myalgias and neck pain.  Neurological: Positive for weakness. Negative for dizziness and speech difficulty.  Hematological: Bruises/bleeds easily.  Psychiatric/Behavioral: Positive for confusion. Negative for agitation and behavioral problems.    Vitals:   12/01/17 1526  BP: 118/70  Pulse: 68  Resp: 18  Temp: 98.5 F (36.9 C)  TempSrc: Oral  SpO2: 95%  Weight: 168 lb (76.2 kg)  Height: 5\' 5"  (1.651 m)   Body mass index is 27.96 kg/m. Physical Exam  Constitutional: She appears well-developed and well-nourished. No distress.  HENT:  Head: Normocephalic and atraumatic.  Right Ear: External ear normal.  Left Ear: External ear normal.  Nose: Nose normal.  Mouth/Throat: Oropharynx is clear and moist.  Eyes: Pupils are equal, round, and reactive to light. Conjunctivae and EOM are normal.  Neck: Neck supple. No  JVD present.  Cardiovascular:  Normal rate, regular rhythm, normal heart sounds and intact distal pulses.  Pulmonary/Chest: Effort normal and breath sounds normal. No respiratory distress.  Abdominal: Soft. Bowel sounds are normal. She exhibits no distension. There is no tenderness.  Musculoskeletal:  LLE 3/5, rest 4/5; tender over lower back and neck muscles  Lymphadenopathy:    She has no cervical adenopathy.  Neurological: She is alert.  Oriented to person, place, not time  Skin: Skin is warm and dry. There is pallor.  Psychiatric: She has a normal mood and affect.    Labs reviewed: Basic Metabolic Panel: Recent Labs    09/16/17  NA 141  K 4.5  BUN 26*  CREATININE 0.7   Liver Function Tests: Recent Labs    09/16/17  AST 20  ALT 19  ALKPHOS 86   No results for input(s): LIPASE, AMYLASE in the last 8760 hours. No results for input(s): AMMONIA in the last 8760 hours. CBC: Recent Labs    09/16/17  WBC 18.3  HGB 11.5*  HCT 36  PLT 182   Cardiac Enzymes: No results for input(s): CKTOTAL, CKMB, CKMBINDEX, TROPONINI in the last 8760 hours. BNP: Invalid input(s): POCBNP Lab Results  Component Value Date   HGBA1C 6.0 (H) 10/14/2015   Lab Results  Component Value Date   TSH 3.16 09/20/2015   Assessment/Plan 1. Annual physical exam -performed today, unremarkable, only needed vaccination is shingrix when available  2. Essential hypertension -bp at goal with current regimen  3. Cerebrovascular disease -cont secondary stroke prevention, only has a little mild residual weakness of her leg vs pre-stroke  4. Mixed Alzheimer's and vascular dementia -gradually progressing with memory loss, but no behavioral issues  5. Drug-induced constipation -controlled, cont miralax, senokot s and colace regimen  6. Generalized osteoarthritis of multiple sites -remains her biggest complaint, cont heat, hydrodocone  7. Primary insomnia -cont lunesta which has been effective long term  Family/ staff  Communication:   Discussed with snf nurse  Labs/tests ordered:  No new  Lee Kalt L. Yvonne Petite, D.O. Geriatrics [facility] [address], [place] [address] Cell Phone (Mon-Fri 8am-5pm):  [phone] On Call:  [phone] & follow prompts after 5pm & weekends Office Phone:  [phone] Office Fax:  [phone]

## 2017-12-14 ENCOUNTER — Encounter: Payer: Self-pay | Admitting: Adult Health

## 2017-12-14 ENCOUNTER — Non-Acute Institutional Stay (SKILLED_NURSING_FACILITY): Payer: Medicare Other | Admitting: Adult Health

## 2017-12-14 DIAGNOSIS — H00024 Hordeolum internum left upper eyelid: Secondary | ICD-10-CM

## 2017-12-14 NOTE — Progress Notes (Signed)
Location:  Medical illustrator of Service:  SNF (31) Provider:   Peggye Ley, ANP [facility] [phone]   [name], DO  Patient Care Team: [name], DO as PCP - General (Geriatric Medicine) [facility] [name], [name], NP as Nurse Practitioner (Nurse Practitioner) Marvel Plan, MD as Consulting Physician (Neurology)  Extended Emergency Contact Information Primary Emergency Contact: [name] [place] [address] [address] [place] Home Phone: [phone] Mobile Phone: [phone] Relation: Daughter Secondary Emergency Contact: Reed,[place]en  [place] Home Phone: [phone] Mobile Phone: [phone] Relation: Relative  Code Status:  DNR Goals of care: Advanced Directive information Advanced Directives 12/01/2017  Does Patient Have a Medical Advance Directive? Yes  Type of Estate agent of [place];Living will;Out of facility DNR (pink MOST or yellow form)  Does patient want to make changes to medical advance directive? No - Patient declined  Copy of Healthcare Power of Attorney in Chart? Yes  Pre-existing out of facility DNR order (yellow form or pink MOST form) Yellow form placed in chart (order not valid for inpatient use)     Chief Complaint  Patient presents with  . Acute Visit    left eye discomfort    HPI:  Pt is a 84 y.o. female seen today for an acute visit for left eye discomfort. The nurse asked me to see her for reports of feeling there is sand in her left eye with some irritation. No change in vision or pain. No drainage, swelling, or erythema. No hx of trauma. Symptoms present for 1 day per the nurse, for 1 week per the resident. She has underlying vascular dementia and is a poor historian. Eye flushed per RN. Remains on systane gtts and has tried warm compresses.   Past Medical History:  Diagnosis Date  . Abnormality of gait   .  Allergic rhinitis   . Anxiety   . Arthritis   . Carotid stenosis, right    asymptomatic  <40%  right  ica  and right eca  . Carpal tunnel syndrome on both sides   . Claudication, intermittent (HCC)   . COPD (chronic obstructive pulmonary disease) (HCC)   . Diverticulosis of colon   . Fracture, calcaneus closed   . History of esophagitis   . History of hypothyroidism   . Hypertension   . Internal hemorrhoids   . Irritable bowel syndrome 03/11/2010  . PAD (peripheral artery disease) (HCC) monitored by dr Rubye Oaks   moderate  . Peripheral vascular disease (HCC)   . Stroke (HCC)   . Unspecified hereditary and idiopathic peripheral neuropathy   . Urge urinary incontinence    Past Surgical History:  Procedure Laterality Date  . CAROTID ENDARTERECTOMY Left 11-18-2007  . CATARACT EXTRACTION W/ INTRAOCULAR LENS IMPLANT Right 11/2012  . COLONOSCOPY  2005   mild diverticulosis  . CYSTOSCOPY WITH INJECTION N/A 09/06/2013   Procedure: CYSTOSCOPY WITH BOTOX  INJECTION;  Surgeon: Martina Sinner, MD;  Location: [facility];  Service: Urology;  Laterality: N/A;  . DILATION AND CURETTAGE OF UTERUS  1973    Allergies  Allergen Reactions  . Tramadol Nausea Only    Outpatient Encounter Medications as of 12/14/2017  Medication Sig  . acetaminophen (TYLENOL) 500 MG tablet Take 500 mg by mouth 2 (two) times daily.  Marland Kitchen alum & mag hydroxide-simeth (MINTOX) 200-200-20 MG/5ML suspension Take 15 mLs by mouth as  needed for indigestion or heartburn. And prn indigestion  . amLODipine (NORVASC) 5 MG tablet Take 10 mg by mouth daily.   Marland Kitchen. atorvastatin (LIPITOR) 20 MG tablet Take 1 tablet (20 mg total) by mouth daily at 6 PM.  . clopidogrel (PLAVIX) 75 MG tablet Take 75 mg by mouth daily.   Marland Kitchen. docusate sodium (COLACE) 100 MG capsule Take 100 mg by mouth daily.  . eszopiclone (LUNESTA) 1 MG TABS tablet Take 1 mg by mouth at bedtime. Take immediately before bedtime  .  HYDROcodone-acetaminophen (NORCO) 7.5-325 MG tablet Take 1 tablet by mouth every 6 (six) hours as needed for moderate pain (back pain). Then 1 tablet by mouth at bedtime  . Menthol (COUGH DROPS) 10 MG LOZG Use as directed 1 lozenge in the mouth or throat as needed.  . pantoprazole (PROTONIX) 40 MG tablet Take 40 mg by mouth daily.  Bertram Gala. Polyethyl Glycol-Propyl Glycol (SYSTANE) 0.4-0.3 % GEL ophthalmic gel Place 1 application into both eyes 4 (four) times daily.  . polyethylene glycol powder (GLYCOLAX/MIRALAX) powder Take 17 g by mouth every other day. Mix in 8 oz liquid and drink  . senna-docusate (SENNA S) 8.6-50 MG tablet Take 2 tablets by mouth 2 (two) times daily as needed for mild constipation.   . SUPER B COMPLEX/C PO Take 2 tablets by mouth daily.   No facility-administered encounter medications on file as of 12/14/2017.     Review of Systems  Constitutional: Negative for activity change.  Eyes: Negative for photophobia, pain, discharge, redness, itching and visual disturbance.       Eye irritation, "feels like something is in there"    Immunization History  Administered Date(s) Administered  . Influenza Inj Mdck Quad Pf 03/13/2016  . Influenza-Unspecified 03/09/2013, 03/01/2014, 03/08/2015, 03/19/2017  . PPD Test 12/04/2011  . Pneumococcal Conjugate-13 09/12/2014  . Pneumococcal Polysaccharide-23 09/12/2015  . Td 06/03/2016   Pertinent  Health Maintenance Due  Topic Date Due  . INFLUENZA VACCINE  12/24/2017  . PNA vac Low Risk Adult  Completed   Fall Risk  09/16/2017 10/21/2016 09/10/2016 02/18/2016 09/12/2015  Falls in the past year? No Yes Yes No No  Number falls in past yr: - 1 1 - -  Injury with Fall? - No No - -  Comment - - - - -  Risk for fall due to : - - History of fall(s) - -  Follow up - - Education provided - -   Functional Status Survey:    Vitals:   12/14/17 1645  BP: 119/69  Pulse: (!) 52  Temp: (!) 96 F (35.6 C)   There is no height or weight on file  to calculate BMI. Physical Exam  Constitutional: No distress.  Eyes: Pupils are equal, round, and reactive to light. Conjunctivae and EOM are normal. Right eye exhibits no discharge. Left eye exhibits no discharge.  Left upper lid inner hordeolum with lid flip.  No ciliary flush. Vision checked and equal in both eyes.  Lymphadenopathy:    She has no cervical adenopathy.  Skin: She is not diaphoretic.    Labs reviewed: Recent Labs    09/16/17  NA 141  K 4.5  BUN 26*  CREATININE 0.7   Recent Labs    09/16/17  AST 20  ALT 19  ALKPHOS 86   Recent Labs    09/16/17  WBC 18.3  HGB 11.5*  HCT 36  PLT 182   Lab Results  Component Value Date   TSH 3.16  09/20/2015   Lab Results  Component Value Date   HGBA1C 6.0 (H) 10/14/2015   Lab Results  Component Value Date   CHOL 166 11/17/2016   HDL 106 (A) 11/17/2016   LDLCALC 53 11/17/2016   TRIG 36 (A) 11/17/2016   CHOLHDL 3.2 10/14/2015    Significant Diagnostic Results in last 30 days:  No results found.  Assessment/Plan  1. Hordeolum internum of left upper eyelid With associated sensation of sand in her eye ? Abrasion At the skilled facility we do not have the ability to check for abrasion. Given her symptoms will treat prophylactically.  If no improvement, refer to ophthalmology. Erythromycin 0.5% ointment 0.5 in ribbon 4 x day for 3 days Heat 15 min tid x 3 days Family/ staff Communication: staff/resident  Labs/tests ordered:  NA

## 2017-12-15 DIAGNOSIS — H04122 Dry eye syndrome of left lacrimal gland: Secondary | ICD-10-CM | POA: Diagnosis not present

## 2017-12-15 DIAGNOSIS — H02005 Unspecified entropion of left lower eyelid: Secondary | ICD-10-CM | POA: Diagnosis not present

## 2018-01-11 ENCOUNTER — Non-Acute Institutional Stay (SKILLED_NURSING_FACILITY): Payer: Medicare Other | Admitting: Adult Health

## 2018-01-11 ENCOUNTER — Encounter: Payer: Self-pay | Admitting: Adult Health

## 2018-01-11 DIAGNOSIS — I6522 Occlusion and stenosis of left carotid artery: Secondary | ICD-10-CM | POA: Diagnosis not present

## 2018-01-11 DIAGNOSIS — F0280 Dementia in other diseases classified elsewhere without behavioral disturbance: Secondary | ICD-10-CM | POA: Diagnosis not present

## 2018-01-11 DIAGNOSIS — I10 Essential (primary) hypertension: Secondary | ICD-10-CM | POA: Diagnosis not present

## 2018-01-11 DIAGNOSIS — G309 Alzheimer's disease, unspecified: Secondary | ICD-10-CM | POA: Diagnosis not present

## 2018-01-11 DIAGNOSIS — I679 Cerebrovascular disease, unspecified: Secondary | ICD-10-CM

## 2018-01-11 DIAGNOSIS — T17908A Unspecified foreign body in respiratory tract, part unspecified causing other injury, initial encounter: Secondary | ICD-10-CM | POA: Diagnosis not present

## 2018-01-11 DIAGNOSIS — F0150 Vascular dementia without behavioral disturbance: Secondary | ICD-10-CM | POA: Diagnosis not present

## 2018-01-11 NOTE — Progress Notes (Signed)
Location:  Medical illustrator of Service:  SNF (31) Provider:   Peggye Ley, ANP [facility] [phone]   [name], DO  Patient Care Team: [name], DO as PCP - General (Geriatric Medicine) Community, [facility] [name], [name], NP as Nurse Practitioner (Nurse Practitioner) Marvel Plan, MD as Consulting Physician (Neurology)  Extended Emergency Contact Information Primary Emergency Contact: [name] [place] [address] [address] [place] Home Phone: [phone] Mobile Phone: [phone] Relation: Daughter Secondary Emergency Contact: Reed,Tilden  [place] Home Phone: [phone] Mobile Phone: [phone] Relation: Relative  Code Status:  DNR Goals of care: Advanced Directive information Advanced Directives 12/01/2017  Does Patient Have a Medical Advance Directive? Yes  Type of Estate agent of [place];Living will;Out of facility DNR (pink MOST or yellow form)  Does patient want to make changes to medical advance directive? No - Patient declined  Copy of Healthcare Power of Attorney in Chart? Yes  Pre-existing out of facility DNR order (yellow form or pink MOST form) Yellow form placed in chart (order not valid for inpatient use)     Chief Complaint  Patient presents with  . Acute Visit    cough after meal    HPI:  Pt is a 84 y.o. female seen today for coughing after a meal. She has a hx of vascular dementia and CVA with left sided weakness. The nurse reported that after breakfast on 01/10/18 she had a coughing spell with excessive clear mucus. She also had some nausea. Vital signs were stable except her BP was 171/73. Lungs were clear on the nurses exam. She has not had any issues dysphagia. Her current diet is regular with cut up meats. She feels much better today with no further coughing nausea, or feelings of unwellness. She ate breakfast and was just  ordering lunch as I entered the room.   Past Medical History:  Diagnosis Date  . Abnormality of gait   . Allergic rhinitis   . Anxiety   . Arthritis   . Carotid stenosis, right    asymptomatic  <40%  right  ica  and right eca  . Carpal tunnel syndrome on both sides   . Cerebrovascular accident (CVA) due to thrombosis of right middle cerebral artery (HCC)   . Claudication, intermittent (HCC)   . COPD (chronic obstructive pulmonary disease) (HCC)   . Diverticulosis of colon   . Fracture, calcaneus closed   . History of esophagitis   . History of hypothyroidism   . Hypertension   . Internal hemorrhoids   . Irritable bowel syndrome 03/11/2010  . PAD (peripheral artery disease) (HCC) monitored by dr Rubye Oaks   moderate  . Peripheral vascular disease (HCC)   . Stroke (HCC)   . Unspecified hereditary and idiopathic peripheral neuropathy   . Urge urinary incontinence    Past Surgical History:  Procedure Laterality Date  . CAROTID ENDARTERECTOMY Left 11-18-2007  . CATARACT EXTRACTION W/ INTRAOCULAR LENS IMPLANT Right 11/2012  . COLONOSCOPY  2005   mild diverticulosis  . CYSTOSCOPY WITH INJECTION N/A 09/06/2013   Procedure: CYSTOSCOPY WITH BOTOX  INJECTION;  Surgeon: Martina Sinner, MD;  Location: [facility];  Service: Urology;  Laterality: N/A;  . DILATION AND CURETTAGE OF UTERUS  1973    Allergies  Allergen Reactions  . Tramadol Nausea Only    Outpatient Encounter Medications as of 01/11/2018  Medication  Sig  . acetaminophen (TYLENOL) 500 MG tablet Take 500 mg by mouth 2 (two) times daily.  Marland Kitchen. alum & mag hydroxide-simeth (MINTOX) 200-200-20 MG/5ML suspension Take 15 mLs by mouth as needed for indigestion or heartburn. And prn indigestion  . amLODipine (NORVASC) 5 MG tablet Take 10 mg by mouth daily.   Marland Kitchen. atorvastatin (LIPITOR) 20 MG tablet Take 1 tablet (20 mg total) by mouth daily at 6 PM.  . clopidogrel (PLAVIX) 75 MG tablet Take 75 mg by mouth daily.   Marland Kitchen.  docusate sodium (COLACE) 100 MG capsule Take 100 mg by mouth daily.  . eszopiclone (LUNESTA) 1 MG TABS tablet Take 1 mg by mouth at bedtime. Take immediately before bedtime  . HYDROcodone-acetaminophen (NORCO) 7.5-325 MG tablet Take 1 tablet by mouth every 6 (six) hours as needed for moderate pain (back pain). Then 1 tablet by mouth at bedtime  . Menthol (COUGH DROPS) 10 MG LOZG Use as directed 1 lozenge in the mouth or throat as needed.  . pantoprazole (PROTONIX) 40 MG tablet Take 40 mg by mouth daily.  Bertram Gala. Polyethyl Glycol-Propyl Glycol (SYSTANE) 0.4-0.3 % GEL ophthalmic gel Place 1 application into both eyes 4 (four) times daily.  . polyethylene glycol powder (GLYCOLAX/MIRALAX) powder Take 17 g by mouth every other day. Mix in 8 oz liquid and drink  . senna-docusate (SENNA S) 8.6-50 MG tablet Take 2 tablets by mouth 2 (two) times daily as needed for mild constipation.   . SUPER B COMPLEX/C PO Take 2 tablets by mouth daily.   No facility-administered encounter medications on file as of 01/11/2018.     Review of Systems  Constitutional: Negative for activity change, appetite change, chills, diaphoresis, fatigue, fever and unexpected weight change.  HENT: Negative for congestion.   Respiratory: Positive for cough (resolved). Negative for shortness of breath and wheezing.   Cardiovascular: Negative for chest pain, palpitations and leg swelling.  Gastrointestinal: Negative for abdominal distention, abdominal pain, constipation and diarrhea.  Genitourinary: Negative for difficulty urinating and dysuria.  Musculoskeletal: Positive for arthralgias, back pain and gait problem. Negative for joint swelling and myalgias.  Neurological: Positive for weakness (LLE). Negative for dizziness, tremors, seizures, syncope, facial asymmetry, speech difficulty, light-headedness, numbness and headaches.  Psychiatric/Behavioral: Positive for confusion (mild chronic). Negative for agitation and behavioral problems.     Immunization History  Administered Date(s) Administered  . Influenza Inj Mdck Quad Pf 03/13/2016  . Influenza-Unspecified 03/09/2013, 03/01/2014, 03/08/2015, 03/19/2017  . PPD Test 12/04/2011  . Pneumococcal Conjugate-13 09/12/2014  . Pneumococcal Polysaccharide-23 09/12/2015  . Td 06/03/2016   Pertinent  Health Maintenance Due  Topic Date Due  . INFLUENZA VACCINE  12/24/2017  . PNA vac Low Risk Adult  Completed   Fall Risk  09/16/2017 10/21/2016 09/10/2016 02/18/2016 09/12/2015  Falls in the past year? No Yes Yes No No  Number falls in past yr: - 1 1 - -  Injury with Fall? - No No - -  Comment - - - - -  Risk for fall due to : - - History of fall(s) - -  Follow up - - Education provided - -   Functional Status Survey:    Vitals:   01/11/18 1115  BP: (!) 171/73  Pulse: 64  Resp: 20  Temp: (!) 97.5 F (36.4 C)  SpO2: 92%  Weight: 172 lb 3.2 oz (78.1 kg)   Body mass index is 28.66 kg/m. Physical Exam  Constitutional: She is oriented to person, place, and time. No distress.  HENT:  Head: Normocephalic and atraumatic.  Right Ear: External ear normal.  Left Ear: External ear normal.  Nose: Nose normal.  Mouth/Throat: Oropharynx is clear and moist. No oropharyngeal exudate.  Eyes: Pupils are equal, round, and reactive to light. Conjunctivae are normal. Right eye exhibits no discharge. Left eye exhibits no discharge.  Neck: No JVD present.  Cardiovascular: Normal rate and regular rhythm.  No murmur heard. Pulmonary/Chest: Effort normal. No respiratory distress. She has no wheezes.  Bibasilar fine crackles  Abdominal: Soft. Bowel sounds are normal. She exhibits no distension. There is no tenderness.  Lymphadenopathy:    She has cervical adenopathy.  Neurological: She is alert and oriented to person, place, and time.  Skin: Skin is warm and dry. She is not diaphoretic.  Psychiatric: She has a normal mood and affect.  Nursing note and vitals reviewed.   Labs  reviewed: Recent Labs    09/16/17  NA 141  K 4.5  BUN 26*  CREATININE 0.7   Recent Labs    09/16/17  AST 20  ALT 19  ALKPHOS 86   Recent Labs    09/16/17  WBC 18.3  HGB 11.5*  HCT 36  PLT 182   Lab Results  Component Value Date   TSH 3.16 09/20/2015   Lab Results  Component Value Date   HGBA1C 6.0 (H) 10/14/2015   Lab Results  Component Value Date   CHOL 166 11/17/2016   HDL 106 (A) 11/17/2016   LDLCALC 53 11/17/2016   TRIG 36 (A) 11/17/2016   CHOLHDL 3.2 10/14/2015    Significant Diagnostic Results in last 30 days:  No results found.  Assessment/Plan  1. Aspiration into airway, initial encounter One time event, now asymptomatic Recommend monitoring and if s/s of pna check CXR If further issues swallowing will consult with ST Monitor for diff swallowing Monitor VS qshift x 72 hrs   2. Mixed Alzheimer's and vascular dementia MMSE 11/10/17 17/30 She has some progression in memory loss over the past year but remains quite alert and verbal, able to communicate her needs. She also has an essential tremor that is slightly worse in both hands over time.  ? If this is due to a vascular parkinsonism. It does not seem to be affecting her care at this point. Her goals of care are comfort based with treatment for reversible conditions and avoidance of hospitalizations.   3. Essential hypertension Elevated on 8/18 but she was under stress at that time. Staff to monitor BP qshift and report if consistently above 150/90  4. Cerebrovascular disease With hx of CVA Continue statin and plavix therapy  5. Stenosis of left carotid artery Followed by vascular surgery s/p left endarterectomy  Right eCA with >50% stenosed. Given her declining health I will have a discussion with her and her family before she goes out for further testing regarding this problem. Notes from Dr. Renato Gailseed in April during her last acute event which was left sided weakness suspicious for CVA, indicate  comfort care is appropriate.  Family/ staff Communication: resident  Labs/tests ordered:  NA

## 2018-02-04 ENCOUNTER — Non-Acute Institutional Stay (SKILLED_NURSING_FACILITY): Payer: Medicare Other | Admitting: Adult Health

## 2018-02-04 DIAGNOSIS — G309 Alzheimer's disease, unspecified: Secondary | ICD-10-CM

## 2018-02-04 DIAGNOSIS — F0280 Dementia in other diseases classified elsewhere without behavioral disturbance: Secondary | ICD-10-CM

## 2018-02-04 DIAGNOSIS — I10 Essential (primary) hypertension: Secondary | ICD-10-CM

## 2018-02-04 DIAGNOSIS — F0150 Vascular dementia without behavioral disturbance: Secondary | ICD-10-CM

## 2018-02-04 DIAGNOSIS — N3281 Overactive bladder: Secondary | ICD-10-CM | POA: Diagnosis not present

## 2018-02-04 DIAGNOSIS — K219 Gastro-esophageal reflux disease without esophagitis: Secondary | ICD-10-CM | POA: Diagnosis not present

## 2018-02-04 DIAGNOSIS — G6289 Other specified polyneuropathies: Secondary | ICD-10-CM

## 2018-02-04 DIAGNOSIS — K5903 Drug induced constipation: Secondary | ICD-10-CM

## 2018-02-08 ENCOUNTER — Encounter: Payer: Self-pay | Admitting: Adult Health

## 2018-02-08 NOTE — Progress Notes (Signed)
Location:  Medical illustrator of Service:  SNF (31) Provider:   Peggye Ley, ANP [facility] [phone]   [name], DO  Patient Care Team: [name], DO as PCP - General (Geriatric Medicine) Community, Well [name], [name], NP as Nurse Practitioner (Nurse Practitioner) Marvel Plan, MD as Consulting Physician (Neurology)  Extended Emergency Contact Information Primary Emergency Contact: [name] [place] [address] [address] [place] Home Phone: [phone] Mobile Phone: [phone] Relation: Daughter Secondary Emergency Contact: Reed,Tilden  [place] Home Phone: [phone] Mobile Phone: [phone] Relation: Relative  Code Status:  DNR Goals of care: Advanced Directive information Advanced Directives 12/01/2017  Does Patient Have a Medical Advance Directive? Yes  Type of Estate agent of [place];Living will;Out of facility DNR (pink MOST or yellow form)  Does patient want to make changes to medical advance directive? No - Patient declined  Copy of Healthcare Power of Attorney in Chart? Yes  Pre-existing out of facility DNR order (yellow form or pink MOST form) Yellow form placed in chart (order not valid for inpatient use)     Chief Complaint  Patient presents with  . Medical Management of Chronic Issues    HPI:  Pt is a 84 y.o. female seen today for medical management of chronic diseases.    Mixed AD/vascular dementia: MMSE 11/10/17 17/30, no issues with behaviors  HTN: controlled 130-150 range with occasional outliers in the 180's  OAB: periods of frequency at night but not recently  Peripheral neuropathy:denies pain  Constipation:regular BMS reported   GERD: denies indigestion and has not needed mintox prn, currently on protonix  Functional status: stand turn sit, intermittently incontinent Past Medical History:  Diagnosis Date  .  Abnormality of gait   . Allergic rhinitis   . Anxiety   . Arthritis   . Carotid stenosis, right    asymptomatic  <40%  right  ica  and right eca  . Carpal tunnel syndrome on both sides   . Cerebrovascular accident (CVA) due to thrombosis of right middle cerebral artery (HCC)   . Claudication, intermittent (HCC)   . COPD (chronic obstructive pulmonary disease) (HCC)   . Diverticulosis of colon   . Fracture, calcaneus closed   . History of esophagitis   . History of hypothyroidism   . Hypertension   . Internal hemorrhoids   . Irritable bowel syndrome 03/11/2010  . PAD (peripheral artery disease) (HCC) monitored by dr Rubye Oaks   moderate  . Peripheral vascular disease (HCC)   . Stroke (HCC)   . Unspecified hereditary and idiopathic peripheral neuropathy   . Urge urinary incontinence    Past Surgical History:  Procedure Laterality Date  . CAROTID ENDARTERECTOMY Left 11-18-2007  . CATARACT EXTRACTION W/ INTRAOCULAR LENS IMPLANT Right 11/2012  . COLONOSCOPY  2005   mild diverticulosis  . CYSTOSCOPY WITH INJECTION N/A 09/06/2013   Procedure: CYSTOSCOPY WITH BOTOX  INJECTION;  Surgeon: Martina Sinner, MD;  Location: [facility];  Service: Urology;  Laterality: N/A;  . DILATION AND CURETTAGE OF UTERUS  1973    Allergies  Allergen Reactions  . Tramadol Nausea Only    Outpatient Encounter Medications as of 02/04/2018  Medication Sig  . acetaminophen (TYLENOL) 500 MG tablet Take 500 mg by mouth 2 (two) times daily.  Marland Kitchen alum & mag hydroxide-simeth (MINTOX) 200-200-20 MG/5ML suspension Take 15 mLs by mouth as needed  for indigestion or heartburn. And prn indigestion  . amLODipine (NORVASC) 5 MG tablet Take 10 mg by mouth daily.   Marland Kitchen. atorvastatin (LIPITOR) 20 MG tablet Take 1 tablet (20 mg total) by mouth daily at 6 PM.  . clopidogrel (PLAVIX) 75 MG tablet Take 75 mg by mouth daily.   Marland Kitchen. docusate sodium (COLACE) 100 MG capsule Take 100 mg by mouth daily.  . eszopiclone  (LUNESTA) 1 MG TABS tablet Take 1 mg by mouth at bedtime. Take immediately before bedtime  . HYDROcodone-acetaminophen (NORCO) 7.5-325 MG tablet Take 1 tablet by mouth every 6 (six) hours as needed for moderate pain (back pain). Then 1 tablet by mouth at bedtime  . Menthol (COUGH DROPS) 10 MG LOZG Use as directed 1 lozenge in the mouth or throat as needed.  . pantoprazole (PROTONIX) 40 MG tablet Take 40 mg by mouth daily.  Bertram Gala. Polyethyl Glycol-Propyl Glycol (SYSTANE) 0.4-0.3 % GEL ophthalmic gel Place 1 application into both eyes 4 (four) times daily.  . polyethylene glycol powder (GLYCOLAX/MIRALAX) powder Take 17 g by mouth every other day. Mix in 8 oz liquid and drink  . senna-docusate (SENNA S) 8.6-50 MG tablet Take 2 tablets by mouth 2 (two) times daily as needed for mild constipation.   . SUPER B COMPLEX/C PO Take 2 tablets by mouth daily.   No facility-administered encounter medications on file as of 02/04/2018.     Review of Systems  Constitutional: Negative for activity change, appetite change, chills, diaphoresis, fatigue, fever and unexpected weight change.  HENT: Negative for congestion.   Respiratory: Negative for cough, shortness of breath and wheezing.   Cardiovascular: Negative for chest pain, palpitations and leg swelling.  Gastrointestinal: Negative for abdominal distention, abdominal pain, constipation and diarrhea.  Genitourinary: Negative for difficulty urinating and dysuria.  Musculoskeletal: Positive for arthralgias, gait problem and neck pain. Negative for back pain, joint swelling and myalgias.  Neurological: Positive for weakness. Negative for dizziness, tremors, seizures, syncope, facial asymmetry, speech difficulty, light-headedness, numbness and headaches.  Psychiatric/Behavioral: Positive for confusion. Negative for agitation and behavioral problems.    Immunization History  Administered Date(s) Administered  . Influenza Inj Mdck Quad Pf 03/13/2016  .  Influenza-Unspecified 03/09/2013, 03/01/2014, 03/08/2015, 03/19/2017  . PPD Test 12/04/2011  . Pneumococcal Conjugate-13 09/12/2014  . Pneumococcal Polysaccharide-23 09/12/2015  . Td 06/03/2016   Pertinent  Health Maintenance Due  Topic Date Due  . INFLUENZA VACCINE  12/24/2017  . PNA vac Low Risk Adult  Completed   Fall Risk  09/16/2017 10/21/2016 09/10/2016 02/18/2016 09/12/2015  Falls in the past year? No Yes Yes No No  Number falls in past yr: - 1 1 - -  Injury with Fall? - No No - -  Comment - - - - -  Risk for fall due to : - - History of fall(s) - -  Follow up - - Education provided - -   Functional Status Survey:    Vitals:   02/08/18 1614  Weight: 170 lb 9.6 oz (77.4 kg)   Body mass index is 28.39 kg/m. Physical Exam  Constitutional: She is oriented to person, place, and time. No distress.  HENT:  Head: Normocephalic and atraumatic.  Neck: No JVD present.  Cardiovascular: Normal rate and regular rhythm.  No murmur heard. Pulmonary/Chest: Effort normal and breath sounds normal. No respiratory distress. She has no wheezes.  Abdominal: Soft. Bowel sounds are normal.  Lymphadenopathy:    She has no cervical adenopathy.  Neurological: She is alert  and oriented to person, place, and time.  Skin: Skin is warm and dry. She is not diaphoretic.  Psychiatric: She has a normal mood and affect.  Nursing note and vitals reviewed.   Labs reviewed: Recent Labs    09/16/17  NA 141  K 4.5  BUN 26*  CREATININE 0.7   Recent Labs    09/16/17  AST 20  ALT 19  ALKPHOS 86   Recent Labs    09/16/17  WBC 18.3  HGB 11.5*  HCT 36  PLT 182   Lab Results  Component Value Date   TSH 3.16 09/20/2015   Lab Results  Component Value Date   HGBA1C 6.0 (H) 10/14/2015   Lab Results  Component Value Date   CHOL 166 11/17/2016   HDL 106 (A) 11/17/2016   LDLCALC 53 11/17/2016   TRIG 36 (A) 11/17/2016   CHOLHDL 3.2 10/14/2015    Significant Diagnostic Results in last 30  days:  No results found.  Assessment/Plan  1. Drug-induced constipation Continue colace 100 mg qd, miralax qod, and senna s bid prn  2. OAB (overactive bladder) No treatment needed  3. Other polyneuropathy Denies symptoms at this time, not currently on medication  4. Gastroesophageal reflux disease without esophagitis Continue protonix 40 mg qd  5. Mixed Alzheimer's and vascular dementia Hx of CVA and progressive decline in cognition and function Not currently on medications, she would not benefit at this pont  6. Essential hypertension Controlled, continue Norvasc 10 mg qd Goal <150/90 no aggressive measures per most form  Family/ staff Communication:resident and staff  Labs/tests ordered:  NA

## 2018-03-10 DIAGNOSIS — H35372 Puckering of macula, left eye: Secondary | ICD-10-CM | POA: Diagnosis not present

## 2018-03-10 DIAGNOSIS — Z961 Presence of intraocular lens: Secondary | ICD-10-CM | POA: Diagnosis not present

## 2018-03-10 DIAGNOSIS — H2512 Age-related nuclear cataract, left eye: Secondary | ICD-10-CM | POA: Diagnosis not present

## 2018-03-10 DIAGNOSIS — H353132 Nonexudative age-related macular degeneration, bilateral, intermediate dry stage: Secondary | ICD-10-CM | POA: Diagnosis not present

## 2018-03-16 ENCOUNTER — Non-Acute Institutional Stay (SKILLED_NURSING_FACILITY): Payer: Medicare Other | Admitting: Internal Medicine

## 2018-03-16 ENCOUNTER — Encounter: Payer: Self-pay | Admitting: Internal Medicine

## 2018-03-16 DIAGNOSIS — I679 Cerebrovascular disease, unspecified: Secondary | ICD-10-CM | POA: Diagnosis not present

## 2018-03-16 DIAGNOSIS — M545 Low back pain, unspecified: Secondary | ICD-10-CM

## 2018-03-16 DIAGNOSIS — F0150 Vascular dementia without behavioral disturbance: Secondary | ICD-10-CM

## 2018-03-16 DIAGNOSIS — G8929 Other chronic pain: Secondary | ICD-10-CM

## 2018-03-16 DIAGNOSIS — R5383 Other fatigue: Secondary | ICD-10-CM | POA: Diagnosis not present

## 2018-03-16 DIAGNOSIS — G309 Alzheimer's disease, unspecified: Secondary | ICD-10-CM

## 2018-03-16 DIAGNOSIS — M159 Polyosteoarthritis, unspecified: Secondary | ICD-10-CM

## 2018-03-16 DIAGNOSIS — F0280 Dementia in other diseases classified elsewhere without behavioral disturbance: Secondary | ICD-10-CM

## 2018-03-16 DIAGNOSIS — F5101 Primary insomnia: Secondary | ICD-10-CM

## 2018-03-16 NOTE — Progress Notes (Signed)
Patient ID: Tara Walters, female   DOB: [birth year], 84 y.o.   MRN: [id number]  Location:  [facility] Room Number: 130 Place of Service:  SNF [phone]) Provider:   [name], DO  Patient Care Team: [name], DO as PCP - General (Geriatric Medicine) [facility], [facility] Merrilee Jansky, NP as Nurse Practitioner (Nurse Practitioner) Marvel Plan, MD as Consulting Physician (Neurology)  Extended Emergency Contact Information Primary Emergency Contact: [name] [place] [address] [address] Home Phone: [phone] Mobile Phone: [phone] Relation: Daughter Secondary Emergency Contact: Temitope Flammer,[place]  [place] Home Phone: [phone] Mobile Phone: [phone] Relation: Relative  Code Status:  DNR, MOST Goals of care: Advanced Directive information Advanced Directives 03/16/2018  Does Patient Have a Medical Advance Directive? Yes  Type of Estate agent of [place];Out of facility DNR (pink MOST or yellow form)  Does patient want to make changes to medical advance directive? No - Patient declined  Copy of Healthcare Power of Attorney in Chart? Yes  Pre-existing out of facility DNR order (yellow form or pink MOST form) Yellow form placed in chart (order not valid for inpatient use)   Chief Complaint  Patient presents with  . Medical Management of Chronic Issues    Routine Visit    HPI:  Pt is a 84 y.o. female with h/o prior stroke, vascular dementia, constipation and chronic low back pain, carotid disease seen today for medical management of chronic diseases.    Discussed with SNF nurse:  She has good and bad days.  Yesterday was a bad day where she "felt bad" all day.  When nurse evaluated her, she noted that she got shaky when leaning forward.  She had c/o headache.  She gave her crackers and ginger ale, but she continued to feel bad.  She even wanted to let her  son in law know she felt bad (unusual b/c she likes to be left alone).   Today is better.  She's on a regular heart healthy diet with chopped meats and thin liquids.  Weight up 3 lbs since last month. appears she's been this weight though early this year, as well.    I was about to go see her when the CNAs came to her nurse and noted resident was very lethargic and not herself. We went in and nurse got a set of vitals.  Jillaine just says she "doesn't feel good", but unable to explain it.  Says she feels better than yesterday and is very tired.  She can hardly keep her head up, keeps falling asleep during visit, but no change in strength of extremities, has some chronic slurring of speech.  She just wants to go back to bed and be left alone.  Says she'll feel better later on.  Also notes she aches all over.  No cough, fever, sob.  Reports she slept last night, but woke up several times.  She did not recognize her regular nurse by name or her role.  She was oriented to place.    Past Medical History:  Diagnosis Date  . Abnormality of gait   . Allergic rhinitis   . Anxiety   . Arthritis   . Carotid stenosis, right    asymptomatic  <40%  right  ica  and right eca  . Carpal tunnel syndrome on both sides   . Cerebrovascular accident (CVA) due to thrombosis of right  middle cerebral artery (HCC)   . Claudication, intermittent (HCC)   . COPD (chronic obstructive pulmonary disease) (HCC)   . Diverticulosis of colon   . Fracture, calcaneus closed   . History of esophagitis   . History of hypothyroidism   . Hypertension   . Internal hemorrhoids   . Irritable bowel syndrome 03/11/2010  . PAD (peripheral artery disease) (HCC) monitored by dr Rubye Oaks   moderate  . Peripheral vascular disease (HCC)   . Stroke (HCC)   . Unspecified hereditary and idiopathic peripheral neuropathy   . Urge urinary incontinence    Past Surgical History:  Procedure Laterality Date  . CAROTID ENDARTERECTOMY Left 11-18-2007   . CATARACT EXTRACTION W/ INTRAOCULAR LENS IMPLANT Right 11/2012  . COLONOSCOPY  2005   mild diverticulosis  . CYSTOSCOPY WITH INJECTION N/A 09/06/2013   Procedure: CYSTOSCOPY WITH BOTOX  INJECTION;  Surgeon: Martina Sinner, MD;  Location: [facility];  Service: Urology;  Laterality: N/A;  . DILATION AND CURETTAGE OF UTERUS  1973    Allergies  Allergen Reactions  . Tramadol Nausea Only    Outpatient Encounter Medications as of 03/16/2018  Medication Sig  . acetaminophen (TYLENOL) 500 MG tablet Take 500 mg by mouth 2 (two) times daily.  Marland Kitchen alum & mag hydroxide-simeth (MINTOX) 200-200-20 MG/5ML suspension Take 15 mLs by mouth as needed for indigestion or heartburn. And prn indigestion  . amLODipine (NORVASC) 5 MG tablet Take 10 mg by mouth daily.   Marland Kitchen atorvastatin (LIPITOR) 20 MG tablet Take 1 tablet (20 mg total) by mouth daily at 6 PM.  . clopidogrel (PLAVIX) 75 MG tablet Take 75 mg by mouth daily.   Marland Kitchen docusate sodium (COLACE) 100 MG capsule Take 100 mg by mouth daily.  . eszopiclone (LUNESTA) 1 MG TABS tablet Take 1 mg by mouth at bedtime. Take immediately before bedtime  . HYDROcodone-acetaminophen (NORCO) 7.5-325 MG tablet Take 1 tablet by mouth every 6 (six) hours as needed for moderate pain (back pain). Then 1 tablet by mouth at bedtime  . Menthol (COUGH DROPS) 10 MG LOZG Use as directed 1 lozenge in the mouth or throat as needed.  . Multiple Vitamins-Minerals (PRESERVISION AREDS 2) CAPS Take 1 tablet by mouth 2 (two) times daily.  . pantoprazole (PROTONIX) 40 MG tablet Take 40 mg by mouth daily.  Bertram Gala Glycol-Propyl Glycol (SYSTANE) 0.4-0.3 % GEL ophthalmic gel Place 1 application into both eyes 4 (four) times daily.  . polyethylene glycol powder (GLYCOLAX/MIRALAX) powder Take 17 g by mouth every other day. Mix in 8 oz liquid and drink  . senna-docusate (SENNA S) 8.6-50 MG tablet Take 2 tablets by mouth 2 (two) times daily as needed for mild constipation.   .  SUPER B COMPLEX/C PO Take 2 tablets by mouth daily.   No facility-administered encounter medications on file as of 03/16/2018.     Review of Systems  Constitutional: Positive for activity change and fatigue. Negative for appetite change, chills, fever and unexpected weight change.  HENT: Positive for trouble swallowing. Negative for congestion.   Eyes: Negative for visual disturbance.  Respiratory: Negative for chest tightness and shortness of breath.   Cardiovascular: Negative for chest pain, palpitations and leg swelling.  Gastrointestinal: Positive for constipation. Negative for abdominal pain and blood in stool.  Genitourinary: Negative for dysuria.  Musculoskeletal: Positive for arthralgias, back pain and gait problem.  Skin: Positive for pallor.  Neurological: Positive for weakness. Negative for dizziness.  Hematological: Bruises/bleeds easily.  Psychiatric/Behavioral:  Positive for confusion. Negative for agitation and behavioral problems.    Immunization History  Administered Date(s) Administered  . Influenza Inj Mdck Quad Pf 03/13/2016  . Influenza-Unspecified 03/09/2013, 03/01/2014, 03/08/2015, 03/19/2017  . PPD Test 12/04/2011  . Pneumococcal Conjugate-13 09/12/2014  . Pneumococcal Polysaccharide-23 09/12/2015  . Td 06/03/2016  . Zoster Recombinat (Shingrix) 06/26/2017   Pertinent  Health Maintenance Due  Topic Date Due  . INFLUENZA VACCINE  12/24/2017  . PNA vac Low Risk Adult  Completed   Fall Risk  09/16/2017 10/21/2016 09/10/2016 02/18/2016 09/12/2015  Falls in the past year? No Yes Yes No No  Number falls in past yr: - 1 1 - -  Injury with Fall? - No No - -  Comment - - - - -  Risk for fall due to : - - History of fall(s) - -  Follow up - - Education provided - -   Functional Status Survey:    Vitals:   03/16/18 1036  BP: 139/66  Pulse: 61  Resp: 18  Temp: (!) 97.3 F (36.3 C)  TempSrc: Oral  SpO2: 94%  Weight: 173 lb (78.5 kg)  Height: 5\' 5"  (1.651  m)   Body mass index is 28.79 kg/m. Physical Exam  Constitutional: She appears well-developed and well-nourished. No distress.  Cardiovascular: Normal rate, regular rhythm, normal heart sounds and intact distal pulses.  Pulmonary/Chest: Effort normal and breath sounds normal. No respiratory distress.  Abdominal: Bowel sounds are normal.  Musculoskeletal: Normal range of motion.  Neurological:  Lethargic, falling asleep during visit and head falling to right  Skin: Skin is warm and dry. There is pallor.  Psychiatric:  Flat affect    Labs reviewed: Recent Labs    09/16/17  NA 141  K 4.5  BUN 26*  CREATININE 0.7   Recent Labs    09/16/17  AST 20  ALT 19  ALKPHOS 86   Recent Labs    09/16/17  WBC 18.3  HGB 11.5*  HCT 36  PLT 182   Lab Results  Component Value Date   TSH 3.16 09/20/2015   Lab Results  Component Value Date   HGBA1C 6.0 (H) 10/14/2015   Lab Results  Component Value Date   CHOL 166 11/17/2016   HDL 106 (A) 11/17/2016   LDLCALC 53 11/17/2016   TRIG 36 (A) 11/17/2016   CHOLHDL 3.2 10/14/2015    Assessment/Plan 1. Lethargy -etiology not clear, only clinical change is being sleepy and confused, no cough, sob, urinary symptoms, fever, hypoxia, episodes of choking; admits to fatigue -given goals established with pt's daughter before, she is not to be hospitalized and treated [facility] -pt wants to rest and be left alone so she was put back to bed, vitals were stable  2. Mixed Alzheimer's and vascular dementia (HCC) -cont her current supportive care  3. Chronic midline low back pain without sciatica -cont heat as needed and at hs to her back, scheduled tylenol, prn norco when severe  4. Primary insomnia -on chronic lunesta, has not tolerated stopping this or taking any alternatives in the past  5. Generalized osteoarthritis of multiple sites -has been a problems for many years for her, cont tylenol,  norco, heat, topicals were no longer helping her  6. Cerebrovascular disease -suspect she's either about to come down with the viral illness a few other residents have or she's very tired, or she is having yet another stroke event  Family/ staff Communication: discussed with  SNF nurse  Labs/tests ordered:  No new unless specific symptoms present themselves.  Aleysha Meckler L. Sadarius Norman, D.O. Geriatrics [facility] [address], [place] [address] Cell Phone (Mon-Fri 8am-5pm):  [phone] On Call:  [phone] & follow prompts after 5pm & weekends Office Phone:  [phone] Office Fax:  [phone]

## 2018-03-25 DIAGNOSIS — Z23 Encounter for immunization: Secondary | ICD-10-CM | POA: Diagnosis not present

## 2018-04-12 ENCOUNTER — Non-Acute Institutional Stay (SKILLED_NURSING_FACILITY): Payer: Medicare Other | Admitting: Adult Health

## 2018-04-12 ENCOUNTER — Encounter: Payer: Self-pay | Admitting: Adult Health

## 2018-04-12 DIAGNOSIS — I10 Essential (primary) hypertension: Secondary | ICD-10-CM | POA: Diagnosis not present

## 2018-04-12 DIAGNOSIS — K219 Gastro-esophageal reflux disease without esophagitis: Secondary | ICD-10-CM

## 2018-04-12 DIAGNOSIS — K5903 Drug induced constipation: Secondary | ICD-10-CM

## 2018-04-12 DIAGNOSIS — M545 Low back pain: Secondary | ICD-10-CM

## 2018-04-12 DIAGNOSIS — I679 Cerebrovascular disease, unspecified: Secondary | ICD-10-CM | POA: Diagnosis not present

## 2018-04-12 DIAGNOSIS — G8929 Other chronic pain: Secondary | ICD-10-CM | POA: Diagnosis not present

## 2018-04-12 DIAGNOSIS — R6889 Other general symptoms and signs: Secondary | ICD-10-CM | POA: Diagnosis not present

## 2018-04-12 DIAGNOSIS — M159 Polyosteoarthritis, unspecified: Secondary | ICD-10-CM | POA: Diagnosis not present

## 2018-04-12 NOTE — Progress Notes (Signed)
Location:  Medical illustrator of Service:  SNF (31) Provider:   Peggye Ley, ANP [facility] [phone]   [name], DO  Patient Care Team: [name], DO as PCP - General (Geriatric Medicine) [facility] [name], [name], NP as Nurse Practitioner (Nurse Practitioner) Marvel Plan, MD as Consulting Physician (Neurology)  Extended Emergency Contact Information Primary Emergency Contact: [name] [place] [address] [address] [place] Home Phone: [phone] Mobile Phone: [phone] Relation: Daughter Secondary Emergency Contact: Reed,[place]  [place] Home Phone: [phone] Mobile Phone: [phone] Relation: Relative  Code Status:  DNR Goals of care: Advanced Directive information Advanced Directives 03/16/2018  Does Patient Have a Medical Advance Directive? Yes  Type of Estate agent of [place];Out of facility DNR (pink MOST or yellow form)  Does patient want to make changes to medical advance directive? No - Patient declined  Copy of Healthcare Power of Attorney in Chart? Yes  Pre-existing out of facility DNR order (yellow form or pink MOST form) Yellow form placed in chart (order not valid for inpatient use)     Chief Complaint  Patient presents with  . Medical Management of Chronic Issues    HPI:  Pt is a 84 y.o. female seen today for medical management of chronic diseases.  She resides in skilled care due to progressive dementia and weakness associated with CVA.    She has a hx of carotid endarterectomy on the left and is followed by vascular surgery. She has a hx of CVA with residual left leg weakness.   The nurse reports she sleeps more during they day and can be sluggish. Temps in matrix are recorded quite low 94-95 degrees at times and we are not certain that this is accurate. BPs are recorded 150-170 at times but normal other times  with the electric cuff.   She reports that at night she wakes up with low back pain which is chronic and takes norco nightly and as needed. She has used Norco 5 x in the past 2 weeks. She denies any radicular pain, numbness, tingling, or change in B/B habits.   She reports otherwise she is sleeping and eating well but does not like the food. Bowel moving regularly.  Denies any reflux symptoms or difficulty swallowing  Functional status: uses the stand up lift for transfers    Past Medical History:  Diagnosis Date  . Abnormality of gait   . Allergic rhinitis   . Anxiety   . Arthritis   . Carotid stenosis, right    asymptomatic  <40%  right  ica  and right eca  . Carpal tunnel syndrome on both sides   . Cerebrovascular accident (CVA) due to thrombosis of right middle cerebral artery (HCC)   . Claudication, intermittent (HCC)   . COPD (chronic obstructive pulmonary disease) (HCC)   . Diverticulosis of colon   . Fracture, calcaneus closed   . History of esophagitis   . History of hypothyroidism   . Hypertension   . Internal hemorrhoids   . Irritable bowel syndrome 03/11/2010  . PAD (peripheral artery disease) (HCC) monitored by dr Rubye Oaks   moderate  . Peripheral vascular disease (HCC)   . Stroke (HCC)   . Unspecified hereditary and idiopathic peripheral neuropathy   . Urge urinary incontinence    Past Surgical History:  Procedure Laterality Date  . CAROTID ENDARTERECTOMY Left 11-18-2007  .  CATARACT EXTRACTION W/ INTRAOCULAR LENS IMPLANT Right 11/2012  . COLONOSCOPY  2005   mild diverticulosis  . CYSTOSCOPY WITH INJECTION N/A 09/06/2013   Procedure: CYSTOSCOPY WITH BOTOX  INJECTION;  Surgeon: Martina Sinner, MD;  Location: [facility];  Service: Urology;  Laterality: N/A;  . DILATION AND CURETTAGE OF UTERUS  1973    Allergies  Allergen Reactions  . Tramadol Nausea Only    Outpatient Encounter Medications as of 04/12/2018  Medication Sig  .  acetaminophen (TYLENOL) 500 MG tablet Take 500 mg by mouth 2 (two) times daily.  Marland Kitchen alum & mag hydroxide-simeth (MINTOX) 200-200-20 MG/5ML suspension Take 15 mLs by mouth as needed for indigestion or heartburn. And prn indigestion  . amLODipine (NORVASC) 5 MG tablet Take 10 mg by mouth daily.   Marland Kitchen atorvastatin (LIPITOR) 20 MG tablet Take 1 tablet (20 mg total) by mouth daily at 6 PM.  . clopidogrel (PLAVIX) 75 MG tablet Take 75 mg by mouth daily.   Marland Kitchen docusate sodium (COLACE) 100 MG capsule Take 100 mg by mouth daily.  . eszopiclone (LUNESTA) 1 MG TABS tablet Take 1 mg by mouth at bedtime. Take immediately before bedtime  . HYDROcodone-acetaminophen (NORCO) 7.5-325 MG tablet Take 1 tablet by mouth every 6 (six) hours as needed for moderate pain (back pain). Then 1 tablet by mouth at bedtime  . Menthol (COUGH DROPS) 10 MG LOZG Use as directed 1 lozenge in the mouth or throat as needed.  . Multiple Vitamins-Minerals (PRESERVISION AREDS 2) CAPS Take 1 tablet by mouth 2 (two) times daily.  . pantoprazole (PROTONIX) 40 MG tablet Take 40 mg by mouth daily.  Bertram Gala Glycol-Propyl Glycol (SYSTANE) 0.4-0.3 % GEL ophthalmic gel Place 1 application into both eyes 4 (four) times daily.  . polyethylene glycol powder (GLYCOLAX/MIRALAX) powder Take 17 g by mouth every other day. Mix in 8 oz liquid and drink  . senna-docusate (SENNA S) 8.6-50 MG tablet Take 2 tablets by mouth 2 (two) times daily as needed for mild constipation.   . SUPER B COMPLEX/C PO Take 2 tablets by mouth daily.   No facility-administered encounter medications on file as of 04/12/2018.     Review of Systems  Constitutional: Positive for fatigue. Negative for activity change, appetite change, chills, diaphoresis, fever and unexpected weight change.       Low temp  HENT: Negative for congestion.   Respiratory: Negative for cough, shortness of breath and wheezing.   Cardiovascular: Negative for chest pain, palpitations and leg swelling.    Gastrointestinal: Negative for abdominal distention, abdominal pain, constipation and diarrhea.  Genitourinary: Negative for difficulty urinating and dysuria.  Musculoskeletal: Positive for arthralgias, back pain and gait problem. Negative for joint swelling and myalgias.  Neurological: Positive for weakness. Negative for dizziness, tremors, seizures, syncope, facial asymmetry, speech difficulty, light-headedness, numbness and headaches.  Psychiatric/Behavioral: Negative for agitation, behavioral problems and confusion.    Immunization History  Administered Date(s) Administered  . Influenza Inj Mdck Quad Pf 03/13/2016  . Influenza,inj,Quad PF,6+ Mos 03/16/2018  . Influenza-Unspecified 03/09/2013, 03/01/2014, 03/08/2015, 03/19/2017  . PPD Test 12/04/2011  . Pneumococcal Conjugate-13 09/12/2014  . Pneumococcal Polysaccharide-23 09/12/2015  . Td 06/03/2016  . Zoster Recombinat (Shingrix) 06/26/2017   Pertinent  Health Maintenance Due  Topic Date Due  . INFLUENZA VACCINE  Completed  . PNA vac Low Risk Adult  Completed   Fall Risk  09/16/2017 10/21/2016 09/10/2016 02/18/2016 09/12/2015  Falls in the past year? No Yes Yes No No  Number falls in past yr: - 1 1 - -  Injury with Fall? - No No - -  Comment - - - - -  Risk for fall due to : - - History of fall(s) - -  Follow up - - Education provided - -   Functional Status Survey:    Vitals:   04/12/18 1247  Weight: 172 lb (78 kg)   Body mass index is 28.62 kg/m. Physical Exam  Constitutional: No distress.  HENT:  Head: Normocephalic and atraumatic.  Neck: No JVD present.  Cardiovascular: Normal rate and regular rhythm.  No murmur heard. Pulmonary/Chest: Effort normal and breath sounds normal. No respiratory distress. She has no wheezes.  Abdominal: Soft. Bowel sounds are normal.  Neurological: She is alert.  Oriented to self and place but not time. Pleasant and able to f/c.  LLE weakness 3/5  Skin: Skin is warm and dry. She is  not diaphoretic.  Psychiatric: She has a normal mood and affect.  Nursing note and vitals reviewed.   Labs reviewed: Recent Labs    09/16/17  NA 141  K 4.5  BUN 26*  CREATININE 0.7   Recent Labs    09/16/17  AST 20  ALT 19  ALKPHOS 86   Recent Labs    09/16/17  WBC 18.3  HGB 11.5*  HCT 36  PLT 182   Lab Results  Component Value Date   TSH 3.16 09/20/2015   Lab Results  Component Value Date   HGBA1C 6.0 (H) 10/14/2015   Lab Results  Component Value Date   CHOL 166 11/17/2016   HDL 106 (A) 11/17/2016   LDLCALC 53 11/17/2016   TRIG 36 (A) 11/17/2016   CHOLHDL 3.2 10/14/2015    Significant Diagnostic Results in last 30 days:  No results found.  Assessment/Plan  1. Low body temperature CBC CMP TSH in am due to the above complaint and feeling sluggish  2. Chronic midline low back pain without sciatica Controlled with scheduled and prn norco Consider mattress topper due to reports of an uncomfortable bed  3. Essential hypertension Manual BP qd x 3 days (would avoid automatic cuff use which may skew the numbers)  4. Drug-induced constipation Controlled with colace and miralax  5. Generalized osteoarthritis of multiple sites Controlled as above  6. Gastroesophageal reflux disease without esophagitis Continue protonix 40 mg qd  7. Cerebrovascular disease W/hx of CVA and carotid stenosis Monitor BP Continue statin per neuro   Family/ staff Communication: discussed with Ms. Tinkey. Labs/tests ordered:   CBC CMP TSH in am

## 2018-04-13 DIAGNOSIS — N189 Chronic kidney disease, unspecified: Secondary | ICD-10-CM | POA: Diagnosis not present

## 2018-04-13 DIAGNOSIS — R030 Elevated blood-pressure reading, without diagnosis of hypertension: Secondary | ICD-10-CM | POA: Diagnosis not present

## 2018-04-13 DIAGNOSIS — E039 Hypothyroidism, unspecified: Secondary | ICD-10-CM | POA: Diagnosis not present

## 2018-04-13 DIAGNOSIS — R680 Hypothermia, not associated with low environmental temperature: Secondary | ICD-10-CM | POA: Diagnosis not present

## 2018-04-13 LAB — HEPATIC FUNCTION PANEL
ALT: 25 (ref 7–35)
AST: 22 (ref 13–35)
Alkaline Phosphatase: 107 (ref 25–125)
Bilirubin, Total: 0.3

## 2018-04-13 LAB — CBC AND DIFFERENTIAL
HCT: 40 (ref 36–46)
Hemoglobin: 13.2 (ref 12.0–16.0)
PLATELETS: 177 (ref 150–399)
WBC: 5.8

## 2018-04-13 LAB — BASIC METABOLIC PANEL
BUN: 29 — AB (ref 4–21)
CREATININE: 0.6 (ref 0.5–1.1)
Glucose: 85
Potassium: 4.3 (ref 3.4–5.3)
Sodium: 143 (ref 137–147)

## 2018-04-13 LAB — TSH: TSH: 4.5 (ref 0.41–5.90)

## 2018-05-03 ENCOUNTER — Encounter: Payer: Self-pay | Admitting: Adult Health

## 2018-05-03 ENCOUNTER — Non-Acute Institutional Stay (SKILLED_NURSING_FACILITY): Payer: Medicare Other | Admitting: Adult Health

## 2018-05-03 DIAGNOSIS — K219 Gastro-esophageal reflux disease without esophagitis: Secondary | ICD-10-CM | POA: Diagnosis not present

## 2018-05-03 DIAGNOSIS — G309 Alzheimer's disease, unspecified: Secondary | ICD-10-CM | POA: Diagnosis not present

## 2018-05-03 DIAGNOSIS — R410 Disorientation, unspecified: Secondary | ICD-10-CM

## 2018-05-03 DIAGNOSIS — I679 Cerebrovascular disease, unspecified: Secondary | ICD-10-CM | POA: Diagnosis not present

## 2018-05-03 DIAGNOSIS — F0280 Dementia in other diseases classified elsewhere without behavioral disturbance: Secondary | ICD-10-CM

## 2018-05-03 DIAGNOSIS — F0150 Vascular dementia without behavioral disturbance: Secondary | ICD-10-CM | POA: Diagnosis not present

## 2018-05-03 DIAGNOSIS — I10 Essential (primary) hypertension: Secondary | ICD-10-CM | POA: Diagnosis not present

## 2018-05-03 DIAGNOSIS — J811 Chronic pulmonary edema: Secondary | ICD-10-CM | POA: Diagnosis not present

## 2018-05-03 DIAGNOSIS — R6889 Other general symptoms and signs: Secondary | ICD-10-CM | POA: Diagnosis not present

## 2018-05-03 NOTE — Progress Notes (Signed)
Location:  Medical illustrator of Service:  SNF (31) Provider:   Peggye Ley, ANP [facility] [phone]  [name], DO  Patient Care Team: [name], DO as PCP - General (Geriatric Medicine) Community, Well [name], [name], NP as Nurse Practitioner (Nurse Practitioner) Marvel Plan, MD as Consulting Physician (Neurology)  Extended Emergency Contact Information Primary Emergency Contact: [name] [place] [address] [address] [place] of [place] Home Phone: [phone] Mobile Phone: [phone] Relation: Daughter Secondary Emergency Contact: Reed,T[place]en  [place] Home Phone: [phone] Mobile Phone: [phone] Relation: Relative  Code Status:  DNR Goals of care: Advanced Directive information Advanced Directives 03/16/2018  Does Patient Have a Medical Advance Directive? Yes  Type of Estate agent of [place];Out of facility DNR (pink MOST or yellow form)  Does patient want to make changes to medical advance directive? No - Patient declined  Copy of Healthcare Power of Attorney in Chart? Yes  Pre-existing out of facility DNR order (yellow form or pink MOST form) Yellow form placed in chart (order not valid for inpatient use)     Chief Complaint  Patient presents with  . Acute Visit    lethargy, confusion, low temp  . Medical Management of Chronic Issues    HPI:  Pt is a 84 y.o. female seen today for lethargy, confusion, and low temp, as well as  medical management of chronic diseases.    The nurse reports that she has been sleepy for the past 24 hrs and not eating well. She has been laughing inappropriately and having difficulty following commands. While sleeping last night the nurse noted abnormal jerking motions or tremors. Her temp has been running 94-95. Verifed with two thermometers. This was noted last month as well and labs were ordered which were  unrevealing. Her nurse reports the staff provided a most form in the past for the family to fill out but it was never returned. No other associated symptoms reported such as dysuria, frequency, sob, cp, cough, congestion, etc.  This morning she seemed to be a little better than last night as she got up and ate breakfast and was slightly more alert.  Hx of cerebrovascular disease and CVA with residual left leg weakness   GERD: denies any symptoms of reflux  HTN: BP 116/76  Mixed AD/Vascular dementia: MMSE 11/10/17 17/30 Has shown signs of progressive weakness and memory loss bur remains verbal, able to f/c, participate in conversations and feed herself.   Functional status: lift for transfers, incontinent intermittently Past Medical History:  Diagnosis Date  . Abnormality of gait   . Allergic rhinitis   . Anxiety   . Arthritis   . Carotid stenosis, right    asymptomatic  <40%  right  ica  and right eca  . Carpal tunnel syndrome on both sides   . Cerebrovascular accident (CVA) due to thrombosis of right middle cerebral artery (HCC)   . Claudication, intermittent (HCC)   . COPD (chronic obstructive pulmonary disease) (HCC)   . Diverticulosis of colon   . Fracture, calcaneus closed   . History of esophagitis   . History of hypothyroidism   . Hypertension   . Internal hemorrhoids   . Irritable bowel syndrome 03/11/2010  . PAD (peripheral artery disease) (HCC) monitored by dr Rubye Oaks   moderate  . Peripheral vascular disease (HCC)   . Stroke (HCC)   . Unspecified hereditary and  idiopathic peripheral neuropathy   . Urge urinary incontinence    Past Surgical History:  Procedure Laterality Date  . CAROTID ENDARTERECTOMY Left 11-18-2007  . CATARACT EXTRACTION W/ INTRAOCULAR LENS IMPLANT Right 11/2012  . COLONOSCOPY  2005   mild diverticulosis  . CYSTOSCOPY WITH INJECTION N/A 09/06/2013   Procedure: CYSTOSCOPY WITH BOTOX  INJECTION;  Surgeon: Martina Sinner, MD;  Location:  [facility] [place];  Service: Urology;  Laterality: N/A;  . DILATION AND CURETTAGE OF UTERUS  1973    Allergies  Allergen Reactions  . Tramadol Nausea Only    Outpatient Encounter Medications as of 05/03/2018  Medication Sig  . acetaminophen (TYLENOL) 500 MG tablet Take 500 mg by mouth 2 (two) times daily.  Marland Kitchen alum & mag hydroxide-simeth (MINTOX) 200-200-20 MG/5ML suspension Take 15 mLs by mouth as needed for indigestion or heartburn. And prn indigestion  . amLODipine (NORVASC) 5 MG tablet Take 10 mg by mouth daily.   Marland Kitchen atorvastatin (LIPITOR) 20 MG tablet Take 1 tablet (20 mg total) by mouth daily at 6 PM.  . clopidogrel (PLAVIX) 75 MG tablet Take 75 mg by mouth daily.   Marland Kitchen docusate sodium (COLACE) 100 MG capsule Take 100 mg by mouth daily.  . eszopiclone (LUNESTA) 1 MG TABS tablet Take 1 mg by mouth at bedtime. Take immediately before bedtime  . HYDROcodone-acetaminophen (NORCO) 7.5-325 MG tablet Take 1 tablet by mouth every 6 (six) hours as needed for moderate pain (back pain). Then 1 tablet by mouth at bedtime  . Menthol (COUGH DROPS) 10 MG LOZG Use as directed 1 lozenge in the mouth or throat as needed.  . Multiple Vitamins-Minerals (PRESERVISION AREDS 2) CAPS Take 1 tablet by mouth 2 (two) times daily.  . pantoprazole (PROTONIX) 40 MG tablet Take 40 mg by mouth daily.  Bertram Gala Glycol-Propyl Glycol (SYSTANE) 0.4-0.3 % GEL ophthalmic gel Place 1 application into both eyes 4 (four) times daily.  . polyethylene glycol powder (GLYCOLAX/MIRALAX) powder Take 17 g by mouth every other day. Mix in 8 oz liquid and drink  . senna-docusate (SENNA S) 8.6-50 MG tablet Take 2 tablets by mouth 2 (two) times daily as needed for mild constipation.   . SUPER B COMPLEX/C PO Take 2 tablets by mouth daily.   No facility-administered encounter medications on file as of 05/03/2018.     Review of Systems  Constitutional: Positive for activity change and appetite change. Negative for chills,  diaphoresis, fatigue, fever and unexpected weight change.  HENT: Negative for congestion, postnasal drip, rhinorrhea, sore throat and trouble swallowing.   Respiratory: Negative for cough, shortness of breath and wheezing.   Cardiovascular: Negative for chest pain, palpitations and leg swelling.  Gastrointestinal: Negative for abdominal distention, abdominal pain, constipation and diarrhea.  Genitourinary: Negative for difficulty urinating and dysuria.  Musculoskeletal: Positive for arthralgias, gait problem and neck pain. Negative for back pain, joint swelling and myalgias.  Neurological: Positive for tremors and weakness. Negative for dizziness, seizures, syncope, facial asymmetry, speech difficulty, light-headedness, numbness and headaches.  Psychiatric/Behavioral: Positive for confusion. Negative for agitation and behavioral problems.    Immunization History  Administered Date(s) Administered  . Influenza Inj Mdck Quad Pf 03/13/2016  . Influenza,inj,Quad PF,6+ Mos 03/16/2018  . Influenza-Unspecified 03/09/2013, 03/01/2014, 03/08/2015, 03/19/2017  . PPD Test 12/04/2011  . Pneumococcal Conjugate-13 09/12/2014  . Pneumococcal Polysaccharide-23 09/12/2015  . Td 06/03/2016  . Zoster Recombinat (Shingrix) 06/26/2017   Pertinent  Health Maintenance Due  Topic Date Due  . INFLUENZA VACCINE  Completed  . PNA vac Low Risk Adult  Completed   Fall Risk  09/16/2017 10/21/2016 09/10/2016 02/18/2016 09/12/2015  Falls in the past year? No Yes Yes No No  Number falls in past yr: - 1 1 - -  Injury with Fall? - No No - -  Comment - - - - -  Risk for fall due to : - - History of fall(s) - -  Follow up - - Education provided - -   Functional Status Survey:    Vitals:   05/03/18 1008  BP: 116/76  Pulse: (!) 58  Resp: 16  Temp: (!) 94.4 F (34.7 C)  SpO2: 95%   There is no height or weight on file to calculate BMI. Physical Exam  Constitutional: No distress.  HENT:  Head: Normocephalic and  atraumatic.  Nose: Nose normal.  Mouth/Throat: Oropharynx is clear and moist. No oropharyngeal exudate.  Eyes: Pupils are equal, round, and reactive to light. Conjunctivae and EOM are normal. Right eye exhibits no discharge. Left eye exhibits no discharge.  Neck: No JVD present. No thyromegaly present.  Cardiovascular: Normal rate and regular rhythm.  No murmur heard. No edema  Pulmonary/Chest: Effort normal. She has wheezes (LLL). She has rales (LLL).  Abdominal: Soft. Bowel sounds are normal. She exhibits no distension. There is no tenderness.  Musculoskeletal: She exhibits no edema or tenderness.  kyphosis  Lymphadenopathy:    She has no cervical adenopathy.  Neurological: No cranial nerve deficit.  Sleepy but arouses to verbal stim and able to f/c.  LLE weakness 3/5 chronic. BUE 4/5, RLE 4/5.    Skin: Skin is warm and dry. She is not diaphoretic.  Psychiatric: She has a normal mood and affect.  Nursing note and vitals reviewed.   Labs reviewed: Recent Labs    09/16/17 04/13/18  NA 141 143  K 4.5 4.3  BUN 26* 29*  CREATININE 0.7 0.6   Recent Labs    09/16/17 04/13/18  AST 20 22  ALT 19 25  ALKPHOS 86 107   Recent Labs    09/16/17 04/13/18  WBC 18.3 5.8  HGB 11.5* 13.2  HCT 36 40  PLT 182 177   Lab Results  Component Value Date   TSH 4.50 04/13/2018   Lab Results  Component Value Date   HGBA1C 6.0 (H) 10/14/2015   Lab Results  Component Value Date   CHOL 166 11/17/2016   HDL 106 (A) 11/17/2016   LDLCALC 53 11/17/2016   TRIG 36 (A) 11/17/2016   CHOLHDL 3.2 10/14/2015    Significant Diagnostic Results in last 30 days:  No results found.  Assessment/Plan  1. Acute delirium ? If there is underlying infection  Will check CXR due to abnormal lung sounds and low body temp. BP stable, not shivering and seems to be tolerating well.   2. Low body temperature As above Labs last month unrevealing, ? age related finding NO shivering, the jerking motions she  does in her sleep seem to be an unrelated findings. Oxygenating well.   3. Cerebrovascular disease With associated hx of dementia and CVA Continues on plavix and lipitor for risk reduction   4. Mixed Alzheimer's and vascular dementia (HCC) Progressive decline in cognition and function c/w the disease but acute worsening mental status this am  5. Gastroesophageal reflux disease without esophagitis Continue Protonix 40 mg qd  6. Essential hypertension Controlled Continue Norvasc 10 mg qd   Family/ staff Communication: discussed with resident and nurse  Labs/tests  ordered:  2 view CXR

## 2018-05-17 ENCOUNTER — Other Ambulatory Visit: Payer: Self-pay | Admitting: Adult Health

## 2018-05-17 MED ORDER — HYDROCODONE-ACETAMINOPHEN 7.5-325 MG PO TABS: 1.0000 | ORAL_TABLET | Freq: Every day | ORAL | 0 refills | Status: DC

## 2018-05-30 DIAGNOSIS — R509 Fever, unspecified: Secondary | ICD-10-CM | POA: Diagnosis not present

## 2018-05-31 ENCOUNTER — Encounter: Payer: Self-pay | Admitting: Adult Health

## 2018-05-31 ENCOUNTER — Non-Acute Institutional Stay (SKILLED_NURSING_FACILITY): Payer: Medicare Other | Admitting: Adult Health

## 2018-05-31 DIAGNOSIS — R410 Disorientation, unspecified: Secondary | ICD-10-CM

## 2018-05-31 DIAGNOSIS — F0150 Vascular dementia without behavioral disturbance: Secondary | ICD-10-CM

## 2018-05-31 DIAGNOSIS — M5030 Other cervical disc degeneration, unspecified cervical region: Secondary | ICD-10-CM | POA: Diagnosis not present

## 2018-05-31 DIAGNOSIS — Z7189 Other specified counseling: Secondary | ICD-10-CM

## 2018-05-31 DIAGNOSIS — G309 Alzheimer's disease, unspecified: Secondary | ICD-10-CM

## 2018-05-31 DIAGNOSIS — F0280 Dementia in other diseases classified elsewhere without behavioral disturbance: Secondary | ICD-10-CM | POA: Diagnosis not present

## 2018-05-31 DIAGNOSIS — E860 Dehydration: Secondary | ICD-10-CM | POA: Diagnosis not present

## 2018-05-31 MED ORDER — MORPHINE SULFATE (CONCENTRATE) 20 MG/ML PO SOLN: 5.0000 mg | ORAL | 0 refills | Status: DC | PRN

## 2018-05-31 NOTE — Progress Notes (Signed)
Location:  Medical illustrator of Service:  SNF (31) Provider:   Peggye Ley, ANP [facility] [phone]   [name], DO  Patient Care Team: [name], DO as PCP - General (Geriatric Medicine) Community, Well [name], [name], NP as Nurse Practitioner (Nurse Practitioner) Marvel Plan, MD as Consulting Physician (Neurology)  Extended Emergency Contact Information Primary Emergency Contact: [name] [place] [address] [address] [place] Home Phone: [phone] Mobile Phone: [phone] Relation: Daughter Secondary Emergency Contact: Reed,[place]en  [place] Home Phone: [phone] Mobile Phone: [phone] Relation: Relative  Code Status:  DNR Goals of care: Advanced Directive information Advanced Directives 03/16/2018  Does Patient Have a Medical Advance Directive? Yes  Type of Estate agent of [place];Out of facility DNR (pink MOST or yellow form)  Does patient want to make changes to medical advance directive? No - Patient declined  Copy of Healthcare Power of Attorney in Chart? Yes  Pre-existing out of facility DNR order (yellow form or pink MOST form) Yellow form placed in chart (order not valid for inpatient use)     Chief Complaint  Patient presents with  . Acute Visit    lethargy, fever    HPI:  Pt is a 85 y.o. female seen today for an acute visit for lethargy and fever. She has a hx of mixed AD/vascular dementia, CVA, PVD, GERD, constipation, and chronic back and neck pain.  The nurse reports on 1/4 she was slightly more confused from her baseline, then on 1/5 she had a temp of 100.1 axillary and was further confused, combative, and did not eat or drink. As of 1/6 she remained confused, eating very little and states "I don't feel well" but can not elaborate further. CXR 1/5 showed no acute pulmonary pathology. Temp on 1/6 is 98.4, other vitals are  WNL.  She is increasingly weak and not able to transfer to the chair and has remained in bed.   Just treated for pna on 05/03/18 with Levaquin and was back to baseline prior to this illness.   Past Medical History:  Diagnosis Date  . Abnormality of gait   . Allergic rhinitis   . Anxiety   . Arthritis   . Carotid stenosis, right    asymptomatic  <40%  right  ica  and right eca  . Carpal tunnel syndrome on both sides   . Cerebrovascular accident (CVA) due to thrombosis of right middle cerebral artery (HCC)   . Claudication, intermittent (HCC)   . COPD (chronic obstructive pulmonary disease) (HCC)   . Diverticulosis of colon   . Fracture, calcaneus closed   . History of esophagitis   . History of hypothyroidism   . Hypertension   . Internal hemorrhoids   . Irritable bowel syndrome 03/11/2010  . PAD (peripheral artery disease) (HCC) monitored by dr Rubye Oaks   moderate  . Peripheral vascular disease (HCC)   . Stroke (HCC)   . Unspecified hereditary and idiopathic peripheral neuropathy   . Urge urinary incontinence    Past Surgical History:  Procedure Laterality Date  . CAROTID ENDARTERECTOMY Left 11-18-2007  . CATARACT EXTRACTION W/ INTRAOCULAR LENS IMPLANT Right 11/2012  . COLONOSCOPY  2005   mild diverticulosis  . CYSTOSCOPY WITH INJECTION N/A 09/06/2013   Procedure: CYSTOSCOPY WITH BOTOX  INJECTION;  Surgeon: Martina Sinner, MD;  Location: [facility] [place];  Service: Urology;  Laterality:  N/A;  . DILATION AND CURETTAGE OF UTERUS  1973    Allergies  Allergen Reactions  . Tramadol Nausea Only    Outpatient Encounter Medications as of 05/31/2018  Medication Sig  . acetaminophen (TYLENOL) 500 MG tablet Take 500 mg by mouth 2 (two) times daily.  Marland Kitchen. alum & mag hydroxide-simeth (MINTOX) 200-200-20 MG/5ML suspension Take 15 mLs by mouth as needed for indigestion or heartburn. And prn indigestion  . amLODipine (NORVASC) 5 MG tablet Take 10 mg by mouth daily.   Marland Kitchen.  atorvastatin (LIPITOR) 20 MG tablet Take 1 tablet (20 mg total) by mouth daily at 6 PM.  . clopidogrel (PLAVIX) 75 MG tablet Take 75 mg by mouth daily.   Marland Kitchen. docusate sodium (COLACE) 100 MG capsule Take 100 mg by mouth daily.  . eszopiclone (LUNESTA) 1 MG TABS tablet Take 1 mg by mouth at bedtime. Take immediately before bedtime  . HYDROcodone-acetaminophen (NORCO) 7.5-325 MG tablet Take 1 tablet by mouth at bedtime. Then 1 tablet by mouth at bedtime  . Menthol (COUGH DROPS) 10 MG LOZG Use as directed 1 lozenge in the mouth or throat as needed.  . Multiple Vitamins-Minerals (PRESERVISION AREDS 2) CAPS Take 1 tablet by mouth 2 (two) times daily.  . pantoprazole (PROTONIX) 40 MG tablet Take 40 mg by mouth daily.  Bertram Gala. Polyethyl Glycol-Propyl Glycol (SYSTANE) 0.4-0.3 % GEL ophthalmic gel Place 1 application into both eyes 4 (four) times daily.  . polyethylene glycol powder (GLYCOLAX/MIRALAX) powder Take 17 g by mouth every other day. Mix in 8 oz liquid and drink  . senna-docusate (SENNA S) 8.6-50 MG tablet Take 2 tablets by mouth 2 (two) times daily as needed for mild constipation.   . SUPER B COMPLEX/C PO Take 2 tablets by mouth daily.   No facility-administered encounter medications on file as of 05/31/2018.     Review of Systems  Unable to perform ROS: Dementia    Immunization History  Administered Date(s) Administered  . Influenza Inj Mdck Quad Pf 03/13/2016  . Influenza,inj,Quad PF,6+ Mos 03/16/2018  . Influenza-Unspecified 03/09/2013, 03/01/2014, 03/08/2015, 03/19/2017  . PPD Test 12/04/2011  . Pneumococcal Conjugate-13 09/12/2014  . Pneumococcal Polysaccharide-23 09/12/2015  . Td 06/03/2016  . Zoster Recombinat (Shingrix) 06/26/2017   Pertinent  Health Maintenance Due  Topic Date Due  . INFLUENZA VACCINE  Completed  . PNA vac Low Risk Adult  Completed   Fall Risk  09/16/2017 10/21/2016 09/10/2016 02/18/2016 09/12/2015  Falls in the past year? No Yes Yes No No  Number falls in past yr: -  1 1 - -  Injury with Fall? - No No - -  Comment - - - - -  Risk for fall due to : - - History of fall(s) - -  Follow up - - Education provided - -   Functional Status Survey:    Vitals:   05/31/18 1045  BP: 103/70  Pulse: 75  Resp: 16  Temp: 98.1 F (36.7 C)  SpO2: 92%   There is no height or weight on file to calculate BMI. Physical Exam Vitals signs and nursing note reviewed.  Constitutional:      General: She is not in acute distress.    Appearance: She is toxic-appearing. She is not diaphoretic.  HENT:     Head: Normocephalic and atraumatic.     Right Ear: Tympanic membrane normal. There is no impacted cerumen.     Left Ear: Tympanic membrane normal. There is no impacted cerumen.  Nose: No congestion or rhinorrhea.     Mouth/Throat:     Mouth: Mucous membranes are dry.     Pharynx: Oropharynx is clear. No oropharyngeal exudate.  Eyes:     General:        Right eye: No discharge.        Left eye: No discharge.     Extraocular Movements: Extraocular movements intact.     Conjunctiva/sclera: Conjunctivae normal.     Pupils: Pupils are equal, round, and reactive to light.  Neck:     Musculoskeletal: No neck rigidity.     Vascular: No JVD.  Cardiovascular:     Rate and Rhythm: Normal rate and regular rhythm.     Heart sounds: No murmur.  Pulmonary:     Effort: Pulmonary effort is normal. No respiratory distress.     Breath sounds: Normal breath sounds. No wheezing.  Abdominal:     General: Abdomen is flat. Bowel sounds are normal. There is no distension.     Palpations: Abdomen is soft.     Tenderness: There is no abdominal tenderness. There is no right CVA tenderness or left CVA tenderness.  Musculoskeletal:     Right lower leg: No edema.     Left lower leg: No edema.  Lymphadenopathy:     Cervical: No cervical adenopathy.  Skin:    General: Skin is warm and dry.  Neurological:     Mental Status: She is alert.     Comments: Lethargic, intermittently  following commands but had difficulty completing full neuro exam. Seems to be moving both arms and right leg as usual. Left leg is chronically weak. Equal smile and eyes brows.      Labs reviewed: Recent Labs    09/16/17 04/13/18  NA 141 143  K 4.5 4.3  BUN 26* 29*  CREATININE 0.7 0.6   Recent Labs    09/16/17 04/13/18  AST 20 22  ALT 19 25  ALKPHOS 86 107   Recent Labs    09/16/17 04/13/18  WBC 18.3 5.8  HGB 11.5* 13.2  HCT 36 40  PLT 182 177   Lab Results  Component Value Date   TSH 4.50 04/13/2018   Lab Results  Component Value Date   HGBA1C 6.0 (H) 10/14/2015   Lab Results  Component Value Date   CHOL 166 11/17/2016   HDL 106 (A) 11/17/2016   LDLCALC 53 11/17/2016   TRIG 36 (A) 11/17/2016   CHOLHDL 3.2 10/14/2015    Significant Diagnostic Results in last 30 days:  No results found.  Assessment/Plan  1. Acute delirium Possibly due to underlying infection such as UTI/PNA not evident on CXR  2. Dehydration Very dry mouth and skin with minimal intake See below  3. Mixed Alzheimer's and vascular dementia (HCC) Progressive decline in cognition and function requiring skilled care  4. DDD (degenerative disc disease), cervical Has chronic neck and back pain that effects her sleep and requires scheduled and prn Norco.   5. Advanced care planning/counseling discussion I spoke with her daughter April and son in Location managerlaw [place] by phone. They feel that given Ms. Throne' chronic pain, progressive dementia, and possible depression it would be best if we did not further treat her underlying condition. Ms. Thomasena EdisCollins has had many acute issues in the skilled care environment and has responded to previous treatment for infection but has become progressively weaker over time and has an overall poor quality of life. April would like to avoid any further labs,  IVs, antibiotics, treatments, etc. And focus on comfort care. We agreed to use morphine 5 mg q 4 hrs prn for pain or sob.  She appears comfortable at this time. April expressed understanding of her current plan of care.   I discontinued all other meds as it was difficult for her to swallow.  I spent 25 minutes in counseling and coordination regarding Ms. Collin's plan of care.   Family/ staff Communication: discussed with daughter April Reed  Labs/tests ordered:  UA C and S

## 2018-05-31 NOTE — ACP (Advance Care Planning) (Signed)
I spoke with her daughter Tara Walters and son in Location manager by phone. They feel that given Tara Walters' chronic pain, progressive dementia, and possible depression it would be best if we did not further treat her underlying condition. Tara Walters has had many acute issues in the skilled care environment and has responded to previous treatment for infection but has become progressively weaker over time and has an overall poor quality of life. Tara Walters would like to avoid any further labs, IVs, antibiotics, treatments, etc. And focus on comfort care. We agreed to use morphine 5 mg q 4 hrs prn for pain or sob. She appears comfortable at this time. Tara Walters expressed understanding of her current plan of care.

## 2018-06-01 DIAGNOSIS — R509 Fever, unspecified: Secondary | ICD-10-CM | POA: Diagnosis not present

## 2018-06-01 DIAGNOSIS — R319 Hematuria, unspecified: Secondary | ICD-10-CM | POA: Diagnosis not present

## 2018-06-21 DIAGNOSIS — I6389 Other cerebral infarction: Secondary | ICD-10-CM | POA: Diagnosis not present

## 2018-06-21 DIAGNOSIS — M6389 Disorders of muscle in diseases classified elsewhere, multiple sites: Secondary | ICD-10-CM | POA: Diagnosis not present

## 2018-06-21 DIAGNOSIS — R278 Other lack of coordination: Secondary | ICD-10-CM | POA: Diagnosis not present

## 2018-06-23 DIAGNOSIS — M6389 Disorders of muscle in diseases classified elsewhere, multiple sites: Secondary | ICD-10-CM | POA: Diagnosis not present

## 2018-06-23 DIAGNOSIS — R278 Other lack of coordination: Secondary | ICD-10-CM | POA: Diagnosis not present

## 2018-06-23 DIAGNOSIS — I6389 Other cerebral infarction: Secondary | ICD-10-CM | POA: Diagnosis not present

## 2018-06-25 DIAGNOSIS — I6389 Other cerebral infarction: Secondary | ICD-10-CM | POA: Diagnosis not present

## 2018-06-25 DIAGNOSIS — M6389 Disorders of muscle in diseases classified elsewhere, multiple sites: Secondary | ICD-10-CM | POA: Diagnosis not present

## 2018-06-25 DIAGNOSIS — R278 Other lack of coordination: Secondary | ICD-10-CM | POA: Diagnosis not present

## 2018-06-28 ENCOUNTER — Non-Acute Institutional Stay (SKILLED_NURSING_FACILITY): Payer: Medicare Other | Admitting: Adult Health

## 2018-06-28 ENCOUNTER — Encounter: Payer: Self-pay | Admitting: Adult Health

## 2018-06-28 DIAGNOSIS — K5901 Slow transit constipation: Secondary | ICD-10-CM | POA: Diagnosis not present

## 2018-06-28 DIAGNOSIS — G309 Alzheimer's disease, unspecified: Secondary | ICD-10-CM

## 2018-06-28 DIAGNOSIS — R634 Abnormal weight loss: Secondary | ICD-10-CM

## 2018-06-28 DIAGNOSIS — R269 Unspecified abnormalities of gait and mobility: Secondary | ICD-10-CM

## 2018-06-28 DIAGNOSIS — F0280 Dementia in other diseases classified elsewhere without behavioral disturbance: Secondary | ICD-10-CM | POA: Diagnosis not present

## 2018-06-28 DIAGNOSIS — K219 Gastro-esophageal reflux disease without esophagitis: Secondary | ICD-10-CM

## 2018-06-28 DIAGNOSIS — M6389 Disorders of muscle in diseases classified elsewhere, multiple sites: Secondary | ICD-10-CM | POA: Diagnosis not present

## 2018-06-28 DIAGNOSIS — M159 Polyosteoarthritis, unspecified: Secondary | ICD-10-CM

## 2018-06-28 DIAGNOSIS — I6389 Other cerebral infarction: Secondary | ICD-10-CM | POA: Diagnosis not present

## 2018-06-28 DIAGNOSIS — F0150 Vascular dementia without behavioral disturbance: Secondary | ICD-10-CM | POA: Diagnosis not present

## 2018-06-28 DIAGNOSIS — R278 Other lack of coordination: Secondary | ICD-10-CM | POA: Diagnosis not present

## 2018-06-28 NOTE — Progress Notes (Signed)
Location:  Medical illustrator of Service:  SNF (31) Provider:   Peggye Ley, ANP [facility] [phone]   Tara Balo, DO  Patient Care Team: Tara Balo, DO as PCP - General (Geriatric Medicine) [facility], [facility] Tara Walters, Tara Ore, NP as Nurse Practitioner (Nurse Practitioner) Marvel Plan, MD as Consulting Physician (Neurology)  Extended Emergency Contact Information Primary Emergency Contact: Tara Walters [place] [address] Tara Walters of [place] Home Phone: [phone] Mobile Phone: [phone] Relation: Daughter Secondary Emergency Contact: Reed,Tilden  Tara Walters of [place] Home Phone: [phone] Mobile Phone: [phone] Relation: Relative  Code Status:  DNR Goals of care: Advanced Directive information Advanced Directives 03/16/2018  Does Patient Have a Medical Advance Directive? Yes  Type of Estate agent of [place];Out of facility DNR (pink MOST or yellow form)  Does patient want to make changes to medical advance directive? No - Patient declined  Copy of Healthcare Power of Attorney in Chart? Yes  Pre-existing out of facility DNR order (yellow form or pink MOST form) Yellow form placed in chart (order not valid for inpatient use)     Chief Complaint  Patient presents with  . Medical Management of Chronic Issues    HPI:  Pt is a 85 y.o. female seen today for medical management of chronic diseases.   In January of 2020 she presented with lethargy, dehydration, and fever. After a discussion with her daughter, we decided no to treat her for acute infection and provide comfort care only. She recovered from this acute illness. She is more alert and eating but has lost weight. She is slightly more confused. Nsg notes indicate that she tried to hit the staff one time during care which is new and tried to eat lotion. The day nurse reports that she is doing well and has no  acute complaints. All of her medications were stopped during the acute illness in anticipation of her dying. She has a hx of chronic pain to her neck and knees and hips but denies this for my visit. She has roxanol ordered for pain but has rarely needed it. No reports of constipation on miralax or indigestion (on protonix)  Wt Readings from Last 3 Encounters:  06/28/18 166 lb (75.3 kg)  04/12/18 172 lb (78 kg)  03/16/18 173 lb (78.5 kg)     Past Medical History:  Diagnosis Date  . Abnormality of gait   . Allergic rhinitis   . Anxiety   . Arthritis   . Carotid stenosis, right    asymptomatic  <40%  right  ica  and right eca  . Carpal tunnel syndrome on both sides   . Cerebrovascular accident (CVA) due to thrombosis of right middle cerebral artery (HCC)   . Claudication, intermittent (HCC)   . COPD (chronic obstructive pulmonary disease) (HCC)   . Diverticulosis of colon   . Fracture, calcaneus closed   . History of esophagitis   . History of hypothyroidism   . Hypertension   . Internal hemorrhoids   . Irritable bowel syndrome 03/11/2010  . PAD (peripheral artery disease) (HCC) monitored by dr Rubye Oaks   moderate  . Peripheral vascular disease (HCC)   . Stroke (HCC)   . Unspecified hereditary and idiopathic peripheral neuropathy   . Urge urinary incontinence    Past Surgical History:  Procedure Laterality Date  . CAROTID ENDARTERECTOMY Left 11-18-2007  . CATARACT EXTRACTION W/ INTRAOCULAR LENS IMPLANT  Right 11/2012  . COLONOSCOPY  2005   mild diverticulosis  . CYSTOSCOPY WITH INJECTION N/A 09/06/2013   Procedure: CYSTOSCOPY WITH BOTOX  INJECTION;  Surgeon: Martina Sinner, MD;  Location: [facility];  Service: Urology;  Laterality: N/A;  . DILATION AND CURETTAGE OF UTERUS  1973    Allergies  Allergen Reactions  . Tramadol Nausea Only    Outpatient Encounter Medications as of 06/28/2018  Medication Sig  . docusate sodium (COLACE) 100 MG capsule Take  100 mg by mouth daily.  Marland Kitchen morphine (ROXANOL) 20 MG/ML concentrated solution Take 0.25 mLs (5 mg total) by mouth every 4 (four) hours as needed for severe pain.  . pantoprazole (PROTONIX) 40 MG tablet Take 40 mg by mouth daily.  . polyethylene glycol powder (GLYCOLAX/MIRALAX) powder Take 17 g by mouth daily. Mix in 8 oz liquid and drink  . [DISCONTINUED] acetaminophen (TYLENOL) 500 MG tablet Take 500 mg by mouth 2 (two) times daily.  . [DISCONTINUED] alum & mag hydroxide-simeth (MINTOX) 200-200-20 MG/5ML suspension Take 15 mLs by mouth as needed for indigestion or heartburn. And prn indigestion  . [DISCONTINUED] amLODipine (NORVASC) 5 MG tablet Take 10 mg by mouth daily.   . [DISCONTINUED] atorvastatin (LIPITOR) 20 MG tablet Take 1 tablet (20 mg total) by mouth daily at 6 PM.  . [DISCONTINUED] clopidogrel (PLAVIX) 75 MG tablet Take 75 mg by mouth daily.   . [DISCONTINUED] eszopiclone (LUNESTA) 1 MG TABS tablet Take 1 mg by mouth at bedtime. Take immediately before bedtime  . [DISCONTINUED] HYDROcodone-acetaminophen (NORCO) 7.5-325 MG tablet Take 1 tablet by mouth at bedtime. Then 1 tablet by mouth at bedtime  . [DISCONTINUED] Menthol (COUGH DROPS) 10 MG LOZG Use as directed 1 lozenge in the mouth or throat as needed.  . [DISCONTINUED] Multiple Vitamins-Minerals (PRESERVISION AREDS 2) CAPS Take 1 tablet by mouth 2 (two) times daily.  . [DISCONTINUED] Polyethyl Glycol-Propyl Glycol (SYSTANE) 0.4-0.3 % GEL ophthalmic gel Place 1 application into both eyes 4 (four) times daily.  . [DISCONTINUED] senna-docusate (SENNA S) 8.6-50 MG tablet Take 2 tablets by mouth 2 (two) times daily as needed for mild constipation.   . [DISCONTINUED] SUPER B COMPLEX/C PO Take 2 tablets by mouth daily.   No facility-administered encounter medications on file as of 06/28/2018.     Review of Systems  Constitutional: Negative for activity change, appetite change, chills, diaphoresis, fatigue and fever.       Weight loss    HENT: Negative for congestion.   Respiratory: Negative for cough, shortness of breath and wheezing.   Cardiovascular: Negative for chest pain, palpitations and leg swelling.  Gastrointestinal: Negative for abdominal distention, abdominal pain, constipation and diarrhea.  Genitourinary: Negative for difficulty urinating and dysuria.  Musculoskeletal: Positive for gait problem. Negative for arthralgias, back pain, joint swelling and myalgias.  Neurological: Positive for weakness. Negative for dizziness, tremors, seizures, syncope, facial asymmetry, speech difficulty, light-headedness, numbness and headaches.  Psychiatric/Behavioral: Positive for behavioral problems and confusion. Negative for agitation.    Immunization History  Administered Date(s) Administered  . Influenza Inj Mdck Quad Pf 03/13/2016  . Influenza,inj,Quad PF,6+ Mos 03/16/2018  . Influenza-Unspecified 03/09/2013, 03/01/2014, 03/08/2015, 03/19/2017  . PPD Test 12/04/2011  . Pneumococcal Conjugate-13 09/12/2014  . Pneumococcal Polysaccharide-23 09/12/2015  . Td 06/03/2016  . Zoster Recombinat (Shingrix) 06/26/2017   Pertinent  Health Maintenance Due  Topic Date Due  . INFLUENZA VACCINE  Completed  . PNA vac Low Risk Adult  Completed   Fall Risk  09/16/2017 10/21/2016 09/10/2016 02/18/2016 09/12/2015  Falls in the past year? No Yes Yes No No  Number falls in past yr: - 1 1 - -  Injury with Fall? - No No - -  Comment - - - - -  Risk for fall due to : - - History of fall(s) - -  Follow up - - Education provided - -   Functional Status Survey:    Vitals:   06/28/18 1559  Weight: 166 lb (75.3 kg)   Body mass index is 27.62 kg/m. Physical Exam Vitals signs and nursing note reviewed.  Constitutional:      General: She is not in acute distress.    Appearance: She is not diaphoretic.  HENT:     Head: Normocephalic and atraumatic.     Mouth/Throat:     Mouth: Mucous membranes are moist.     Pharynx: Oropharynx is  clear. No oropharyngeal exudate.  Neck:     Vascular: No JVD.  Cardiovascular:     Rate and Rhythm: Normal rate and regular rhythm.     Heart sounds: No murmur.  Pulmonary:     Effort: Pulmonary effort is normal. No respiratory distress.     Breath sounds: Normal breath sounds. No wheezing.  Musculoskeletal:     Right lower leg: No edema.     Left lower leg: No edema.  Lymphadenopathy:     Cervical: No cervical adenopathy.  Skin:    General: Skin is warm and dry.  Neurological:     General: No focal deficit present.     Mental Status: She is alert. Mental status is at baseline.     Comments: Oriented x 2 MAE Slight weaker generally from baseline.   Psychiatric:        Mood and Affect: Mood normal.     Comments: Less engaging in conversation      Labs reviewed: Recent Labs    09/16/17 04/13/18  NA 141 143  K 4.5 4.3  BUN 26* 29*  CREATININE 0.7 0.6   Recent Labs    09/16/17 04/13/18  AST 20 22  ALT 19 25  ALKPHOS 86 107   Recent Labs    09/16/17 04/13/18  WBC 18.3 5.8  HGB 11.5* 13.2  HCT 36 40  PLT 182 177   Lab Results  Component Value Date   TSH 4.50 04/13/2018   Lab Results  Component Value Date   HGBA1C 6.0 (H) 10/14/2015   Lab Results  Component Value Date   CHOL 166 11/17/2016   HDL 106 (A) 11/17/2016   LDLCALC 53 11/17/2016   TRIG 36 (A) 11/17/2016   CHOLHDL 3.2 10/14/2015    Significant Diagnostic Results in last 30 days:  No results found.  Assessment/Plan 1. Weight loss Due to acute illness. Continue comfort care and nutritional support in the skilled care setting.   2. Mixed Alzheimer's and vascular dementia (HCC) Progressive decline in cognition and function, now slightly worse after acute illness  3. Gastroesophageal reflux disease without esophagitis Continue protonix 40 mg qd, failed previous trail off so would not taper  4. Generalized osteoarthritis of multiple sites Not currently an issue. Continue prn roxanol for  pain  5. Slow transit constipation Continue miralax 17 grams qd  6. Gait disorder Due to progressive dementia and previous CVA. Seems slightly weaker on exam. Uses lift for tranfers.     Family/ staff Communication: discussed with staff/resident  Labs/tests ordered:  NA

## 2018-06-30 DIAGNOSIS — R278 Other lack of coordination: Secondary | ICD-10-CM | POA: Diagnosis not present

## 2018-06-30 DIAGNOSIS — M6389 Disorders of muscle in diseases classified elsewhere, multiple sites: Secondary | ICD-10-CM | POA: Diagnosis not present

## 2018-06-30 DIAGNOSIS — I6389 Other cerebral infarction: Secondary | ICD-10-CM | POA: Diagnosis not present

## 2018-07-03 DIAGNOSIS — R278 Other lack of coordination: Secondary | ICD-10-CM | POA: Diagnosis not present

## 2018-07-03 DIAGNOSIS — M6389 Disorders of muscle in diseases classified elsewhere, multiple sites: Secondary | ICD-10-CM | POA: Diagnosis not present

## 2018-07-03 DIAGNOSIS — I6389 Other cerebral infarction: Secondary | ICD-10-CM | POA: Diagnosis not present

## 2018-07-05 DIAGNOSIS — I6389 Other cerebral infarction: Secondary | ICD-10-CM | POA: Diagnosis not present

## 2018-07-05 DIAGNOSIS — R278 Other lack of coordination: Secondary | ICD-10-CM | POA: Diagnosis not present

## 2018-07-05 DIAGNOSIS — M6389 Disorders of muscle in diseases classified elsewhere, multiple sites: Secondary | ICD-10-CM | POA: Diagnosis not present

## 2018-07-07 DIAGNOSIS — M6389 Disorders of muscle in diseases classified elsewhere, multiple sites: Secondary | ICD-10-CM | POA: Diagnosis not present

## 2018-07-07 DIAGNOSIS — R278 Other lack of coordination: Secondary | ICD-10-CM | POA: Diagnosis not present

## 2018-07-07 DIAGNOSIS — I6389 Other cerebral infarction: Secondary | ICD-10-CM | POA: Diagnosis not present

## 2018-07-09 DIAGNOSIS — M6389 Disorders of muscle in diseases classified elsewhere, multiple sites: Secondary | ICD-10-CM | POA: Diagnosis not present

## 2018-07-09 DIAGNOSIS — I6389 Other cerebral infarction: Secondary | ICD-10-CM | POA: Diagnosis not present

## 2018-07-09 DIAGNOSIS — R278 Other lack of coordination: Secondary | ICD-10-CM | POA: Diagnosis not present

## 2018-07-12 DIAGNOSIS — R278 Other lack of coordination: Secondary | ICD-10-CM | POA: Diagnosis not present

## 2018-07-12 DIAGNOSIS — M6389 Disorders of muscle in diseases classified elsewhere, multiple sites: Secondary | ICD-10-CM | POA: Diagnosis not present

## 2018-07-12 DIAGNOSIS — I6389 Other cerebral infarction: Secondary | ICD-10-CM | POA: Diagnosis not present

## 2018-07-14 DIAGNOSIS — M6389 Disorders of muscle in diseases classified elsewhere, multiple sites: Secondary | ICD-10-CM | POA: Diagnosis not present

## 2018-07-14 DIAGNOSIS — R278 Other lack of coordination: Secondary | ICD-10-CM | POA: Diagnosis not present

## 2018-07-14 DIAGNOSIS — I6389 Other cerebral infarction: Secondary | ICD-10-CM | POA: Diagnosis not present

## 2018-07-21 DIAGNOSIS — R278 Other lack of coordination: Secondary | ICD-10-CM | POA: Diagnosis not present

## 2018-07-21 DIAGNOSIS — M6389 Disorders of muscle in diseases classified elsewhere, multiple sites: Secondary | ICD-10-CM | POA: Diagnosis not present

## 2018-07-21 DIAGNOSIS — I6389 Other cerebral infarction: Secondary | ICD-10-CM | POA: Diagnosis not present

## 2018-07-23 DIAGNOSIS — I6389 Other cerebral infarction: Secondary | ICD-10-CM | POA: Diagnosis not present

## 2018-07-23 DIAGNOSIS — R278 Other lack of coordination: Secondary | ICD-10-CM | POA: Diagnosis not present

## 2018-07-23 DIAGNOSIS — M6389 Disorders of muscle in diseases classified elsewhere, multiple sites: Secondary | ICD-10-CM | POA: Diagnosis not present

## 2018-07-26 DIAGNOSIS — R278 Other lack of coordination: Secondary | ICD-10-CM | POA: Diagnosis not present

## 2018-07-26 DIAGNOSIS — R1312 Dysphagia, oropharyngeal phase: Secondary | ICD-10-CM | POA: Diagnosis not present

## 2018-07-26 DIAGNOSIS — I6389 Other cerebral infarction: Secondary | ICD-10-CM | POA: Diagnosis not present

## 2018-07-26 DIAGNOSIS — F0280 Dementia in other diseases classified elsewhere without behavioral disturbance: Secondary | ICD-10-CM | POA: Diagnosis not present

## 2018-07-26 DIAGNOSIS — G309 Alzheimer's disease, unspecified: Secondary | ICD-10-CM | POA: Diagnosis not present

## 2018-07-26 DIAGNOSIS — M6389 Disorders of muscle in diseases classified elsewhere, multiple sites: Secondary | ICD-10-CM | POA: Diagnosis not present

## 2018-07-26 DIAGNOSIS — F0150 Vascular dementia without behavioral disturbance: Secondary | ICD-10-CM | POA: Diagnosis not present

## 2018-07-27 DIAGNOSIS — F0280 Dementia in other diseases classified elsewhere without behavioral disturbance: Secondary | ICD-10-CM | POA: Diagnosis not present

## 2018-07-27 DIAGNOSIS — R1312 Dysphagia, oropharyngeal phase: Secondary | ICD-10-CM | POA: Diagnosis not present

## 2018-07-27 DIAGNOSIS — G309 Alzheimer's disease, unspecified: Secondary | ICD-10-CM | POA: Diagnosis not present

## 2018-07-27 DIAGNOSIS — M6389 Disorders of muscle in diseases classified elsewhere, multiple sites: Secondary | ICD-10-CM | POA: Diagnosis not present

## 2018-07-27 DIAGNOSIS — F0150 Vascular dementia without behavioral disturbance: Secondary | ICD-10-CM | POA: Diagnosis not present

## 2018-07-27 DIAGNOSIS — R278 Other lack of coordination: Secondary | ICD-10-CM | POA: Diagnosis not present

## 2018-07-28 DIAGNOSIS — F0150 Vascular dementia without behavioral disturbance: Secondary | ICD-10-CM | POA: Diagnosis not present

## 2018-07-28 DIAGNOSIS — R1312 Dysphagia, oropharyngeal phase: Secondary | ICD-10-CM | POA: Diagnosis not present

## 2018-07-28 DIAGNOSIS — R278 Other lack of coordination: Secondary | ICD-10-CM | POA: Diagnosis not present

## 2018-07-28 DIAGNOSIS — G309 Alzheimer's disease, unspecified: Secondary | ICD-10-CM | POA: Diagnosis not present

## 2018-07-28 DIAGNOSIS — F0280 Dementia in other diseases classified elsewhere without behavioral disturbance: Secondary | ICD-10-CM | POA: Diagnosis not present

## 2018-07-28 DIAGNOSIS — M6389 Disorders of muscle in diseases classified elsewhere, multiple sites: Secondary | ICD-10-CM | POA: Diagnosis not present

## 2018-07-30 ENCOUNTER — Non-Acute Institutional Stay (SKILLED_NURSING_FACILITY): Payer: Medicare Other | Admitting: Adult Health

## 2018-07-30 ENCOUNTER — Encounter: Payer: Self-pay | Admitting: Adult Health

## 2018-07-30 DIAGNOSIS — I10 Essential (primary) hypertension: Secondary | ICD-10-CM

## 2018-07-30 DIAGNOSIS — F5101 Primary insomnia: Secondary | ICD-10-CM

## 2018-07-30 DIAGNOSIS — I639 Cerebral infarction, unspecified: Secondary | ICD-10-CM | POA: Diagnosis not present

## 2018-07-30 DIAGNOSIS — R531 Weakness: Secondary | ICD-10-CM | POA: Diagnosis not present

## 2018-07-30 DIAGNOSIS — M159 Polyosteoarthritis, unspecified: Secondary | ICD-10-CM | POA: Diagnosis not present

## 2018-07-30 NOTE — Progress Notes (Signed)
Location:  Medical illustrator of Service:  SNF (31) Provider:   Peggye Ley, ANP [facility] [phone]   [name], DO  Patient Care Team: [name], DO as PCP - General (Geriatric Medicine) [facility] [name], [name], NP as Nurse Practitioner (Nurse Practitioner) Marvel Plan, MD as Consulting Physician (Neurology)  Extended Emergency Contact Information Primary Emergency Contact: [name] [place] [address] [place] of [place] Home Phone: [phone] Mobile Phone: [phone] Relation: Daughter Secondary Emergency Contact: Reed,[place]en  [place] of [place] Home Phone: [phone] Mobile Phone: [phone] Relation: Relative  Code Status:  DNR Goals of care: Advanced Directive information Advanced Directives 03/16/2018  Does Patient Have a Medical Advance Directive? Yes  Type of Estate agent of [place];Out of facility DNR (pink MOST or yellow form)  Does patient want to make changes to medical advance directive? No - Patient declined  Copy of Healthcare Power of Attorney in Chart? Yes  Pre-existing out of facility DNR order (yellow form or pink MOST form) Yellow form placed in chart (order not valid for inpatient use)     Chief Complaint  Patient presents with  . Medical Management of Chronic Issues    HPI:  Pt is a 85 y.o. female seen today for medical management of chronic diseases.  She resides in skilled care. One month ago she developed left sided weakness and facial droop. Since that time she has mildly improved but continues with drooling, spilling food at meals, and mild dysarthria. She was in the stand up lift and her knee buckled on 3/4 and she sustained a bruise to her left leg. She requires roxanol afterwards but since that time has not needed any pain meds. There are no issues reported of coughing or choking with meals. Her goals of care are  comfort based with no interventions, including antibiotics. She reports to me that she has trouble falling asleep. Her memory has worsened since this event. Staff do not note any difficulty sleeping.    Past Medical History:  Diagnosis Date  . Abnormality of gait   . Allergic rhinitis   . Anxiety   . Arthritis   . Carotid stenosis, right    asymptomatic  <40%  right  ica  and right eca  . Carpal tunnel syndrome on both sides   . Cerebrovascular accident (CVA) due to thrombosis of right middle cerebral artery (HCC)   . Claudication, intermittent (HCC)   . COPD (chronic obstructive pulmonary disease) (HCC)   . Diverticulosis of colon   . Fracture, calcaneus closed   . History of esophagitis   . History of hypothyroidism   . Hypertension   . Internal hemorrhoids   . Irritable bowel syndrome 03/11/2010  . PAD (peripheral artery disease) (HCC) monitored by dr Rubye Oaks   moderate  . Peripheral vascular disease (HCC)   . Stroke (HCC)   . Unspecified hereditary and idiopathic peripheral neuropathy   . Urge urinary incontinence    Past Surgical History:  Procedure Laterality Date  . CAROTID ENDARTERECTOMY Left 11-18-2007  . CATARACT EXTRACTION W/ INTRAOCULAR LENS IMPLANT Right 11/2012  . COLONOSCOPY  2005   mild diverticulosis  . CYSTOSCOPY WITH INJECTION N/A 09/06/2013   Procedure: CYSTOSCOPY WITH BOTOX  INJECTION;  Surgeon: Martina Sinner, MD;  Location: [facility];  Service: Urology;  Laterality: N/A;  . DILATION AND CURETTAGE OF UTERUS  1973  Allergies  Allergen Reactions  . Tramadol Nausea Only    Outpatient Encounter Medications as of 07/30/2018  Medication Sig  . bisacodyl (DULCOLAX) 10 MG suppository Place 10 mg rectally daily as needed for moderate constipation.  . docusate sodium (COLACE) 100 MG capsule Take 100 mg by mouth daily.  Marland Kitchen morphine (ROXANOL) 20 MG/ML concentrated solution Take 0.25 mLs (5 mg total) by mouth every 4 (four) hours as needed  for severe pain.  . pantoprazole (PROTONIX) 40 MG tablet Take 40 mg by mouth daily.  . polyethylene glycol powder (GLYCOLAX/MIRALAX) powder Take 17 g by mouth daily. Mix in 8 oz liquid and drink   No facility-administered encounter medications on file as of 07/30/2018.     Review of Systems  Constitutional: Positive for activity change. Negative for appetite change, chills, diaphoresis, fatigue, fever and unexpected weight change.  HENT: Positive for drooling and voice change. Negative for congestion and trouble swallowing.   Respiratory: Negative for cough, shortness of breath and wheezing.   Cardiovascular: Negative for chest pain, palpitations and leg swelling.  Gastrointestinal: Negative for abdominal distention, abdominal pain, constipation and diarrhea.  Musculoskeletal: Positive for arthralgias and gait problem.  Skin: Negative for wound.  Neurological: Positive for speech difficulty and weakness. Negative for dizziness, tremors, seizures, syncope, facial asymmetry, light-headedness, numbness and headaches.  Psychiatric/Behavioral: Positive for confusion and sleep disturbance. Negative for agitation, behavioral problems and suicidal ideas. The patient is not nervous/anxious.     Immunization History  Administered Date(s) Administered  . Influenza Inj Mdck Quad Pf 03/13/2016  . Influenza,inj,Quad PF,6+ Mos 03/16/2018  . Influenza-Unspecified 03/09/2013, 03/01/2014, 03/08/2015, 03/19/2017  . PPD Test 12/04/2011  . Pneumococcal Conjugate-13 09/12/2014  . Pneumococcal Polysaccharide-23 09/12/2015  . Td 06/03/2016  . Zoster Recombinat (Shingrix) 06/26/2017   Pertinent  Health Maintenance Due  Topic Date Due  . INFLUENZA VACCINE  Completed  . PNA vac Low Risk Adult  Completed   Fall Risk  09/16/2017 10/21/2016 09/10/2016 02/18/2016 09/12/2015  Falls in the past year? No Yes Yes No No  Number falls in past yr: - 1 1 - -  Injury with Fall? - No No - -  Comment - - - - -  Risk for fall  due to : - - History of fall(s) - -  Follow up - - Education provided - -   Functional Status Survey:    Vitals:   07/30/18 1054  Weight: 162 lb (73.5 kg)   Body mass index is 26.96 kg/m. Physical Exam Constitutional:      General: She is not in acute distress.    Appearance: Normal appearance. She is normal weight.  HENT:     Head: Normocephalic and atraumatic.     Nose: No congestion.     Mouth/Throat:     Mouth: Mucous membranes are moist.     Pharynx: Oropharynx is clear. No oropharyngeal exudate.  Neck:     Vascular: No carotid bruit.  Cardiovascular:     Rate and Rhythm: Normal rate and regular rhythm.     Heart sounds: No murmur.  Pulmonary:     Effort: Pulmonary effort is normal.     Breath sounds: Normal breath sounds.  Abdominal:     General: Abdomen is flat. Bowel sounds are normal.     Palpations: Abdomen is soft.  Musculoskeletal:        General: Deformity (kyphosis) present.     Right lower leg: No edema.     Left lower leg: No  edema.  Lymphadenopathy:     Cervical: No cervical adenopathy.  Skin:    General: Skin is warm and dry.  Neurological:     Mental Status: She is alert.     Comments: Oriented x 2. LUE weakness 3/5.  BLE weakness 3/5. RUE 4/5. No facial droop noted.      Labs reviewed: Recent Labs    09/16/17 04/13/18  NA 141 143  K 4.5 4.3  BUN 26* 29*  CREATININE 0.7 0.6   Recent Labs    09/16/17 04/13/18  AST 20 22  ALT 19 25  ALKPHOS 86 107   Recent Labs    09/16/17 04/13/18  WBC 18.3 5.8  HGB 11.5* 13.2  HCT 36 40  PLT 182 177   Lab Results  Component Value Date   TSH 4.50 04/13/2018   Lab Results  Component Value Date   HGBA1C 6.0 (H) 10/14/2015   Lab Results  Component Value Date   CHOL 166 11/17/2016   HDL 106 (A) 11/17/2016   LDLCALC 53 11/17/2016   TRIG 36 (A) 11/17/2016   CHOLHDL 3.2 10/14/2015    Significant Diagnostic Results in last 30 days:  No results found.  Assessment/Plan 1. Cerebrovascular  accident (CVA), unspecified mechanism (HCC) With residual left arm weakness, mild dysarthria, and difficulty feeding  2. Left-sided weakness Continue ST to assist with dining options that will prevent spillage/facilitate eating Uses Nosey cup with meals  Staff to use lift for transfers and assist with care Goal of care comfort based  3. Essential hypertension Controlled without meds  4. Generalized osteoarthritis of multiple sites Denies any pain at this time Continue prn roxanol   5. Primary insomnia Staff to monitor sleep patterns and report back to me If this is an issue could resume Community education officer Communication: resident and nurse  Labs/tests ordered:  NA

## 2018-08-02 ENCOUNTER — Non-Acute Institutional Stay (SKILLED_NURSING_FACILITY): Payer: Medicare Other | Admitting: Adult Health

## 2018-08-02 DIAGNOSIS — M25531 Pain in right wrist: Secondary | ICD-10-CM

## 2018-08-02 DIAGNOSIS — M79641 Pain in right hand: Secondary | ICD-10-CM | POA: Diagnosis not present

## 2018-08-02 NOTE — Progress Notes (Signed)
Location:  Medical illustrator of Service:  SNF (31) Provider:   Peggye Ley, ANP [facility] [phone]   [name], DO  Patient Care Team: [name], DO as PCP - General (Geriatric Medicine) [facility] [name], [name], NP as Nurse Practitioner (Nurse Practitioner) Marvel Plan, MD as Consulting Physician (Neurology)  Extended Emergency Contact Information Primary Emergency Contact: [name] RIDGE [address] [address] [place] Home Phone: [phone] Mobile Phone: [phone] Relation: Daughter Secondary Emergency Contact: Reed,Tilden  [address] [place] Home Phone: [phone] Mobile Phone: [phone] Relation: Relative  Code Status:  DNR Goals of care: Advanced Directive information Advanced Directives 03/16/2018  Does Patient Have a Medical Advance Directive? Yes  Type of Estate agent of [place];Out of facility DNR (pink MOST or yellow form)  Does patient want to make changes to medical advance directive? No - Patient declined  Copy of Healthcare Power of Attorney in Chart? Yes  Pre-existing out of facility DNR order (yellow form or pink MOST form) Yellow form placed in chart (order not valid for inpatient use)     Chief Complaint  Patient presents with  . Acute Visit    right wrist pain    HPI:  Pt is a 85 y.o. female seen today for an acute visit for right wrist pain.   Larey Seat in stand up lift on 3/4 and 3/7 now reporting right wrist pain and swelling which started on 3/7. She is now placed in the hoyer lift.  She also sustained bruise in the stand up lift when her knees buckled on 3/4 and has a residual bruise to the left anterior lower shin.   Past Medical History:  Diagnosis Date  . Abnormality of gait   . Allergic rhinitis   . Anxiety   . Arthritis   . Carotid stenosis, right    asymptomatic  <40%  right  ica  and right eca  .  Carpal tunnel syndrome on both sides   . Cerebrovascular accident (CVA) due to thrombosis of right middle cerebral artery (HCC)   . Claudication, intermittent (HCC)   . COPD (chronic obstructive pulmonary disease) (HCC)   . Diverticulosis of colon   . Fracture, calcaneus closed   . History of esophagitis   . History of hypothyroidism   . Hypertension   . Internal hemorrhoids   . Irritable bowel syndrome 03/11/2010  . PAD (peripheral artery disease) (HCC) monitored by dr Rubye Oaks   moderate  . Peripheral vascular disease (HCC)   . Stroke (HCC)   . Unspecified hereditary and idiopathic peripheral neuropathy   . Urge urinary incontinence    Past Surgical History:  Procedure Laterality Date  . CAROTID ENDARTERECTOMY Left 11-18-2007  . CATARACT EXTRACTION W/ INTRAOCULAR LENS IMPLANT Right 11/2012  . COLONOSCOPY  2005   mild diverticulosis  . CYSTOSCOPY WITH INJECTION N/A 09/06/2013   Procedure: CYSTOSCOPY WITH BOTOX  INJECTION;  Surgeon: Martina Sinner, MD;  Location: [facility];  Service: Urology;  Laterality: N/A;  . DILATION AND CURETTAGE OF UTERUS  1973    Allergies  Allergen Reactions  . Tramadol Nausea Only    Outpatient Encounter Medications as of 08/02/2018  Medication Sig  . bisacodyl (DULCOLAX) 10 MG suppository Place 10 mg rectally daily as needed for moderate constipation.  . docusate sodium (COLACE) 100 MG capsule Take 100 mg by mouth daily.  Marland Kitchen morphine (  ROXANOL) 20 MG/ML concentrated solution Take 0.25 mLs (5 mg total) by mouth every 4 (four) hours as needed for severe pain.  . pantoprazole (PROTONIX) 40 MG tablet Take 40 mg by mouth daily.  . polyethylene glycol powder (GLYCOLAX/MIRALAX) powder Take 17 g by mouth daily. Mix in 8 oz liquid and drink   No facility-administered encounter medications on file as of 08/02/2018.     Review of Systems  Constitutional: Positive for activity change and appetite change. Negative for chills, diaphoresis,  fatigue, fever and unexpected weight change.  HENT: Negative for congestion.   Respiratory: Negative for cough and shortness of breath.   Cardiovascular: Negative for chest pain and leg swelling.  Gastrointestinal: Negative for constipation and diarrhea.  Genitourinary: Negative for difficulty urinating.  Musculoskeletal: Positive for arthralgias, gait problem and joint swelling.  Neurological: Positive for weakness. Negative for tremors, syncope and speech difficulty.  Psychiatric/Behavioral: Positive for confusion. Negative for agitation and behavioral problems.    Immunization History  Administered Date(s) Administered  . Influenza Inj Mdck Quad Pf 03/13/2016  . Influenza,inj,Quad PF,6+ Mos 03/16/2018  . Influenza-Unspecified 03/09/2013, 03/01/2014, 03/08/2015, 03/19/2017  . PPD Test 12/04/2011  . Pneumococcal Conjugate-13 09/12/2014  . Pneumococcal Polysaccharide-23 09/12/2015  . Td 06/03/2016  . Zoster Recombinat (Shingrix) 06/26/2017   Pertinent  Health Maintenance Due  Topic Date Due  . INFLUENZA VACCINE  Completed  . PNA vac Low Risk Adult  Completed   Fall Risk  09/16/2017 10/21/2016 09/10/2016 02/18/2016 09/12/2015  Falls in the past year? No Yes Yes No No  Number falls in past yr: - 1 1 - -  Injury with Fall? - No No - -  Comment - - - - -  Risk for fall due to : - - History of fall(s) - -  Follow up - - Education provided - -   Functional Status Survey:    Vitals:   08/02/18 1015  BP: (!) 138/99  Pulse: 74  Resp: 18  Temp: 97.6 F (36.4 C)  SpO2: 94%   There is no height or weight on file to calculate BMI. Physical Exam Vitals signs and nursing note reviewed.  Constitutional:      Appearance: Normal appearance.  Eyes:     General:        Right eye: No discharge.        Left eye: No discharge.     Extraocular Movements: Extraocular movements intact.     Conjunctiva/sclera: Conjunctivae normal.     Pupils: Pupils are equal, round, and reactive to light.    Cardiovascular:     Rate and Rhythm: Normal rate and regular rhythm.  Pulmonary:     Effort: Pulmonary effort is normal.     Breath sounds: Normal breath sounds.  Abdominal:     General: Abdomen is flat. Bowel sounds are normal.     Palpations: Abdomen is soft.  Skin:    General: Skin is warm and dry.     Findings: Bruising (left lower anterior shin) present.     Comments: Right wrist with mild swelling and erythema, no bruising or deformity. Not able to flex thumb or digits 2-5  Neurological:     Mental Status: She is alert.     Comments: Oriented x 2. No facial droop. Left arm and leg weakness chronic and unchanged.   Psychiatric:        Mood and Affect: Mood normal.     Labs reviewed: Recent Labs    09/16/17 04/13/18  NA 141 143  K 4.5 4.3  BUN 26* 29*  CREATININE 0.7 0.6   Recent Labs    09/16/17 04/13/18  AST 20 22  ALT 19 25  ALKPHOS 86 107   Recent Labs    09/16/17 04/13/18  WBC 18.3 5.8  HGB 11.5* 13.2  HCT 36 40  PLT 182 177   Lab Results  Component Value Date   TSH 4.50 04/13/2018   Lab Results  Component Value Date   HGBA1C 6.0 (H) 10/14/2015   Lab Results  Component Value Date   CHOL 166 11/17/2016   HDL 106 (A) 11/17/2016   LDLCALC 53 11/17/2016   TRIG 36 (A) 11/17/2016   CHOLHDL 3.2 10/14/2015    Significant Diagnostic Results in last 30 days:  No results found.  Assessment/Plan  1) Right wrist pain Xray neg ?gout Draw uric acid Prednisone 20 mg bid x 5 days with foot Tylenol 650 mg bid x 5 days Ice 15 min tid x 48 hr Pt should use the hoyer lift for transfers as she is too weak for a stand up lift   Family/ staff Communication: discussed with nurse  Labs/tests ordered:  2 view xray of right wrist

## 2018-08-03 DIAGNOSIS — Z79899 Other long term (current) drug therapy: Secondary | ICD-10-CM | POA: Diagnosis not present

## 2018-08-03 DIAGNOSIS — E785 Hyperlipidemia, unspecified: Secondary | ICD-10-CM | POA: Diagnosis not present

## 2018-08-03 DIAGNOSIS — R1312 Dysphagia, oropharyngeal phase: Secondary | ICD-10-CM | POA: Diagnosis not present

## 2018-08-03 DIAGNOSIS — R278 Other lack of coordination: Secondary | ICD-10-CM | POA: Diagnosis not present

## 2018-08-03 DIAGNOSIS — G309 Alzheimer's disease, unspecified: Secondary | ICD-10-CM | POA: Diagnosis not present

## 2018-08-03 DIAGNOSIS — F0280 Dementia in other diseases classified elsewhere without behavioral disturbance: Secondary | ICD-10-CM | POA: Diagnosis not present

## 2018-08-03 DIAGNOSIS — M6389 Disorders of muscle in diseases classified elsewhere, multiple sites: Secondary | ICD-10-CM | POA: Diagnosis not present

## 2018-08-03 DIAGNOSIS — F0150 Vascular dementia without behavioral disturbance: Secondary | ICD-10-CM | POA: Diagnosis not present

## 2018-08-04 DIAGNOSIS — G309 Alzheimer's disease, unspecified: Secondary | ICD-10-CM | POA: Diagnosis not present

## 2018-08-04 DIAGNOSIS — F0280 Dementia in other diseases classified elsewhere without behavioral disturbance: Secondary | ICD-10-CM | POA: Diagnosis not present

## 2018-08-04 DIAGNOSIS — R278 Other lack of coordination: Secondary | ICD-10-CM | POA: Diagnosis not present

## 2018-08-04 DIAGNOSIS — M6389 Disorders of muscle in diseases classified elsewhere, multiple sites: Secondary | ICD-10-CM | POA: Diagnosis not present

## 2018-08-04 DIAGNOSIS — F0150 Vascular dementia without behavioral disturbance: Secondary | ICD-10-CM | POA: Diagnosis not present

## 2018-08-04 DIAGNOSIS — R1312 Dysphagia, oropharyngeal phase: Secondary | ICD-10-CM | POA: Diagnosis not present

## 2018-08-05 DIAGNOSIS — F0280 Dementia in other diseases classified elsewhere without behavioral disturbance: Secondary | ICD-10-CM | POA: Diagnosis not present

## 2018-08-05 DIAGNOSIS — R278 Other lack of coordination: Secondary | ICD-10-CM | POA: Diagnosis not present

## 2018-08-05 DIAGNOSIS — R1312 Dysphagia, oropharyngeal phase: Secondary | ICD-10-CM | POA: Diagnosis not present

## 2018-08-05 DIAGNOSIS — M6389 Disorders of muscle in diseases classified elsewhere, multiple sites: Secondary | ICD-10-CM | POA: Diagnosis not present

## 2018-08-05 DIAGNOSIS — F0150 Vascular dementia without behavioral disturbance: Secondary | ICD-10-CM | POA: Diagnosis not present

## 2018-08-05 DIAGNOSIS — G309 Alzheimer's disease, unspecified: Secondary | ICD-10-CM | POA: Diagnosis not present

## 2018-08-06 DIAGNOSIS — F0150 Vascular dementia without behavioral disturbance: Secondary | ICD-10-CM | POA: Diagnosis not present

## 2018-08-06 DIAGNOSIS — M6389 Disorders of muscle in diseases classified elsewhere, multiple sites: Secondary | ICD-10-CM | POA: Diagnosis not present

## 2018-08-06 DIAGNOSIS — G309 Alzheimer's disease, unspecified: Secondary | ICD-10-CM | POA: Diagnosis not present

## 2018-08-06 DIAGNOSIS — F0280 Dementia in other diseases classified elsewhere without behavioral disturbance: Secondary | ICD-10-CM | POA: Diagnosis not present

## 2018-08-06 DIAGNOSIS — R1312 Dysphagia, oropharyngeal phase: Secondary | ICD-10-CM | POA: Diagnosis not present

## 2018-08-06 DIAGNOSIS — R278 Other lack of coordination: Secondary | ICD-10-CM | POA: Diagnosis not present

## 2018-08-16 DIAGNOSIS — G309 Alzheimer's disease, unspecified: Secondary | ICD-10-CM | POA: Diagnosis not present

## 2018-08-16 DIAGNOSIS — F0150 Vascular dementia without behavioral disturbance: Secondary | ICD-10-CM | POA: Diagnosis not present

## 2018-08-16 DIAGNOSIS — R1312 Dysphagia, oropharyngeal phase: Secondary | ICD-10-CM | POA: Diagnosis not present

## 2018-08-16 DIAGNOSIS — F0280 Dementia in other diseases classified elsewhere without behavioral disturbance: Secondary | ICD-10-CM | POA: Diagnosis not present

## 2018-08-16 DIAGNOSIS — M6389 Disorders of muscle in diseases classified elsewhere, multiple sites: Secondary | ICD-10-CM | POA: Diagnosis not present

## 2018-08-16 DIAGNOSIS — R278 Other lack of coordination: Secondary | ICD-10-CM | POA: Diagnosis not present

## 2018-08-24 ENCOUNTER — Non-Acute Institutional Stay (SKILLED_NURSING_FACILITY): Payer: Medicare Other | Admitting: Internal Medicine

## 2018-08-24 ENCOUNTER — Encounter: Payer: Self-pay | Admitting: Internal Medicine

## 2018-08-24 DIAGNOSIS — M159 Polyosteoarthritis, unspecified: Secondary | ICD-10-CM

## 2018-08-24 DIAGNOSIS — M545 Low back pain, unspecified: Secondary | ICD-10-CM

## 2018-08-24 DIAGNOSIS — K5901 Slow transit constipation: Secondary | ICD-10-CM | POA: Diagnosis not present

## 2018-08-24 DIAGNOSIS — I639 Cerebral infarction, unspecified: Secondary | ICD-10-CM | POA: Diagnosis not present

## 2018-08-24 DIAGNOSIS — I69359 Hemiplegia and hemiparesis following cerebral infarction affecting unspecified side: Secondary | ICD-10-CM

## 2018-08-24 DIAGNOSIS — F0280 Dementia in other diseases classified elsewhere without behavioral disturbance: Secondary | ICD-10-CM | POA: Diagnosis not present

## 2018-08-24 DIAGNOSIS — G8929 Other chronic pain: Secondary | ICD-10-CM | POA: Diagnosis not present

## 2018-08-24 DIAGNOSIS — I739 Peripheral vascular disease, unspecified: Secondary | ICD-10-CM

## 2018-08-24 DIAGNOSIS — F0150 Vascular dementia without behavioral disturbance: Secondary | ICD-10-CM

## 2018-08-24 DIAGNOSIS — G309 Alzheimer's disease, unspecified: Principal | ICD-10-CM

## 2018-08-24 NOTE — Progress Notes (Signed)
Patient ID: Tara Walters, female   DOB: [birth year], 85 y.o.   MRN: [id number]  Location:  [facility] Room Number: 130 Place of Service:  SNF ([phone]) Provider:   [name], DO  Patient Care Team: [name], DO as PCP - General (Geriatric Medicine) [facility] Merrilee Jansky, NP as Nurse Practitioner (Nurse Practitioner) Marvel Plan, MD as Consulting Physician (Neurology)  Extended Emergency Contact Information Primary Emergency Contact: Tara Walters [address] [address] [place] Home Phone: [phone] Mobile Phone: [phone] Relation: Daughter Secondary Emergency Contact: Tara Walters,Tilden  [address] [place] Home Phone: [phone] Mobile Phone: [phone] Relation: Relative  Code Status:  DNR, MOST Goals of care: Advanced Directive information Advanced Directives 03/16/2018  Does Patient Have a Medical Advance Directive? Yes  Type of Estate agent of [place];Out of facility DNR (pink MOST or yellow form)  Does patient want to make changes to medical advance directive? No - Patient declined  Copy of Healthcare Power of Attorney in Chart? Yes  Pre-existing out of facility DNR order (yellow form or pink MOST form) Yellow form placed in chart (order not valid for inpatient use)     Chief Complaint  Patient presents with  . Medical Management of Chronic Issues    Routine Visit    HPI:  Pt is a 85 y.o. female seen today for medical management of chronic diseases.  She lives in SNF for long-term care due to stroke, dementia and severe OA that limit her mobility and ADLs.    Staff reported that her mood had been poor over the weekend and she was not nice to the staff.    She was better this morning and very pleasant when I saw her.    She had no new complaints.  Her back pain is improved with use of her heating pad and her current pain regimen of prn morphine  when it's particularly bad.  She also has chronic neck pain and knee pain, but is not as bothered by those today.  Bowels are moving with the regimen of daily colace, miralax and prn bisacodyl suppositories.    She's not longer ambulatory for some time now so she's not bothered by claudication from PAD as she once was.    BPs remain in the 140s often, but she has not tolerated lower bps with dizziness.    Her memory continues to decline and she did not remember when asked about her meals so far today what she'd eaten.    Goals of care are comfort-based per her wishes as documented on her MOST form and discussed with her HCPOA.  Past Medical History:  Diagnosis Date  . Abnormality of gait   . Allergic rhinitis   . Anxiety   . Arthritis   . Carotid stenosis, right    asymptomatic  <40%  right  ica  and right eca  . Carpal tunnel syndrome on both sides   . Cerebrovascular accident (CVA) due to thrombosis of right middle cerebral artery (HCC)   . Claudication, intermittent (HCC)   . COPD (chronic obstructive pulmonary disease) (HCC)   . Diverticulosis of colon   . Fracture, calcaneus closed   . History of esophagitis   . History of hypothyroidism   . Hypertension   . Internal hemorrhoids   . Irritable bowel syndrome 03/11/2010  . PAD (peripheral artery disease) (HCC) monitored by dr Rubye Oaks  moderate  . Peripheral vascular disease (HCC)   . Stroke (HCC)   . Unspecified hereditary and idiopathic peripheral neuropathy   . Urge urinary incontinence    Past Surgical History:  Procedure Laterality Date  . CAROTID ENDARTERECTOMY Left 11-18-2007  . CATARACT EXTRACTION W/ INTRAOCULAR LENS IMPLANT Right 11/2012  . COLONOSCOPY  2005   mild diverticulosis  . CYSTOSCOPY WITH INJECTION N/A 09/06/2013   Procedure: CYSTOSCOPY WITH BOTOX  INJECTION;  Surgeon: Martina Sinner, MD;  Location: [facility];  Service: Urology;  Laterality: N/A;  . DILATION AND CURETTAGE OF  UTERUS  1973    Allergies  Allergen Reactions  . Tramadol Nausea Only    Outpatient Encounter Medications as of 08/24/2018  Medication Sig  . bisacodyl (DULCOLAX) 10 MG suppository Place 10 mg rectally daily as needed for moderate constipation.  . docusate (COLACE) 50 MG/5ML liquid Take 10 mg by mouth daily.  Marland Kitchen morphine (ROXANOL) 20 MG/ML concentrated solution Take 0.25 mLs (5 mg total) by mouth every 4 (four) hours as needed for severe pain.  . polyethylene glycol powder (GLYCOLAX/MIRALAX) powder Take 17 g by mouth daily. Mix in 8 oz liquid and drink  . [DISCONTINUED] docusate sodium (COLACE) 100 MG capsule Take 100 mg by mouth daily.  . [DISCONTINUED] pantoprazole (PROTONIX) 40 MG tablet Take 40 mg by mouth daily.   No facility-administered encounter medications on file as of 08/24/2018.     Review of Systems  Constitutional: Positive for fatigue. Negative for activity change, appetite change, chills and fever.       Weight loss of 10 lbs over a 3-4 month period recently, but remains overweight just slightly by BMI  HENT: Positive for hearing loss. Negative for congestion.   Eyes: Negative for visual disturbance.  Respiratory: Negative for chest tightness and shortness of breath.   Cardiovascular: Negative for chest pain, palpitations and leg swelling.  Gastrointestinal: Positive for constipation. Negative for abdominal pain, blood in stool, nausea and vomiting.  Genitourinary: Negative for dysuria.       Incontinence  Musculoskeletal: Positive for arthralgias and back pain.  Skin: Negative for color change.  Neurological: Positive for weakness. Negative for dizziness.  Psychiatric/Behavioral: Positive for confusion. Negative for agitation, behavioral problems and sleep disturbance. The patient is not nervous/anxious.     Immunization History  Administered Date(s) Administered  . Influenza Inj Mdck Quad Pf 03/13/2016  . Influenza,inj,Quad PF,6+ Mos 03/16/2018  .  Influenza-Unspecified 03/09/2013, 03/01/2014, 03/08/2015, 03/19/2017  . PPD Test 12/04/2011  . Pneumococcal Conjugate-13 09/12/2014  . Pneumococcal Polysaccharide-23 09/12/2015  . Td 06/03/2016  . Zoster Recombinat (Shingrix) 06/26/2017   Pertinent  Health Maintenance Due  Topic Date Due  . INFLUENZA VACCINE  Completed  . PNA vac Low Risk Adult  Completed   Fall Risk  09/16/2017 10/21/2016 09/10/2016 02/18/2016 09/12/2015  Falls in the past year? No Yes Yes No No  Number falls in past yr: - 1 1 - -  Injury with Fall? - No No - -  Comment - - - - -  Risk for fall due to : - - History of fall(s) - -  Follow up - - Education provided - -   Functional Status Survey:    Vitals:   08/24/18 1426  BP: (!) 143/80  Pulse: 72  Resp: 18  Temp: 98.1 F (36.7 C)  TempSrc: Oral  SpO2: 95%  Weight: 162 lb (73.5 kg)  Height:  (1.651 m)   Body mass index is  26.96 kg/m. Physical Exam Vitals signs and nursing note reviewed.  Constitutional:      General: She is not in acute distress.    Appearance: Normal appearance. She is normal weight. She is not toxic-appearing.  HENT:     Head: Normocephalic and atraumatic.  Cardiovascular:     Rate and Rhythm: Normal rate and regular rhythm.     Pulses: Normal pulses.  Pulmonary:     Effort: Pulmonary effort is normal.  Abdominal:     General: Bowel sounds are normal.  Skin:    Coloration: Skin is pale.  Neurological:     Mental Status: She is alert. Mental status is at baseline.     Comments: Chronic left weakness; oriented to person, place, not time  Psychiatric:        Mood and Affect: Mood normal.    Labs reviewed: Recent Labs    09/16/17 04/13/18  NA 141 143  K 4.5 4.3  BUN 26* 29*  CREATININE 0.7 0.6   Recent Labs    09/16/17 04/13/18  AST 20 22  ALT 19 25  ALKPHOS 86 107   Recent Labs    09/16/17 04/13/18  WBC 18.3 5.8  HGB 11.5* 13.2  HCT 36 40  PLT 182 177   Lab Results  Component Value Date   TSH 4.50  04/13/2018   Lab Results  Component Value Date   HGBA1C 6.0 (H) 10/14/2015   Lab Results  Component Value Date   CHOL 166 11/17/2016   HDL 106 (A) 11/17/2016   LDLCALC 53 11/17/2016   TRIG 36 (A) 11/17/2016   CHOLHDL 3.2 10/14/2015    Significant Diagnostic Results in last 30 days:  No results found.  Assessment/Plan 1. Mixed Alzheimer's and vascular dementia (HCC) - MMSE - Mini Mental State Exam 05/12/2017 09/10/2016  Orientation to time 4 4  Orientation to Place 5 5  Registration 3 3  Attention/ Calculation 2 0  Recall 2 0  Language- name 2 objects 2 2  Language- repeat 1 1  Language- follow 3 step command 0 1  Language- read & follow direction 1 1  Write a sentence 0 1  Copy design 0 0  Total score 20 18  gradual decline and stepwise with stroke events -comfort goals--she's off all meds otherwise at this point  2. PVD (peripheral vascular disease) with claudication (HCC) -no longer symptomatic due to immobility  3. Generalized osteoarthritis of multiple sites -cont roxanol as needed and conservative measures with heat, repositioning  4. Hemiparesis due to old stroke [facility]) -ongoing left weakness, uses wheelchair with lift for transfers  5. Slow transit constipation -cont current regimen as above which has been effective  6. Chronic midline low back pain without sciatica -cont prn roxanol for pain and heat  Family/ staff Communication: discussed with SNF nurse  Labs/tests ordered:  No new  Davey Bergsma L. Quamesha Mullet, D.O. Geriatrics [facility] [address], [place] [address] Cell Phone (Mon-Fri 8am-5pm):  [phone] On Call:  [phone] & follow prompts after 5pm & weekends Office Phone:  [phone] Office Fax:  [phone]

## 2018-08-30 ENCOUNTER — Other Ambulatory Visit: Payer: Self-pay | Admitting: Adult Health

## 2018-08-30 MED ORDER — MORPHINE SULFATE (CONCENTRATE) 20 MG/ML PO SOLN: 5.0000 mg | ORAL | 0 refills | Status: DC | PRN

## 2018-09-05 NOTE — Addendum Note (Signed)
Addended by: [name] on: 09/05/2018 10:57 AM   Modules accepted: Level of Service

## 2018-09-20 ENCOUNTER — Encounter: Payer: Self-pay | Admitting: Adult Health

## 2018-09-20 ENCOUNTER — Non-Acute Institutional Stay (SKILLED_NURSING_FACILITY): Payer: Medicare Other | Admitting: Adult Health

## 2018-09-20 DIAGNOSIS — I10 Essential (primary) hypertension: Secondary | ICD-10-CM

## 2018-09-20 DIAGNOSIS — F0280 Dementia in other diseases classified elsewhere without behavioral disturbance: Secondary | ICD-10-CM

## 2018-09-20 DIAGNOSIS — R1312 Dysphagia, oropharyngeal phase: Secondary | ICD-10-CM

## 2018-09-20 DIAGNOSIS — G6289 Other specified polyneuropathies: Secondary | ICD-10-CM

## 2018-09-20 DIAGNOSIS — K5901 Slow transit constipation: Secondary | ICD-10-CM

## 2018-09-20 DIAGNOSIS — F0150 Vascular dementia without behavioral disturbance: Secondary | ICD-10-CM

## 2018-09-20 DIAGNOSIS — G309 Alzheimer's disease, unspecified: Secondary | ICD-10-CM

## 2018-09-20 DIAGNOSIS — I679 Cerebrovascular disease, unspecified: Secondary | ICD-10-CM

## 2018-09-20 DIAGNOSIS — M159 Polyosteoarthritis, unspecified: Secondary | ICD-10-CM

## 2018-09-20 NOTE — Progress Notes (Signed)
Location:  Medical illustrator of Service:  SNF (31) Provider:   Peggye Ley, ANP [facility] [phone]   [name], DO  Patient Care Team: [name], DO as PCP - General (Geriatric Medicine) Community, Well [name], [name], NP as Nurse Practitioner (Nurse Practitioner) Marvel Plan, MD as Consulting Physician (Neurology)  Extended Emergency Contact Information Primary Emergency Contact: [name] [place] [address] [address] of [place] Home Phone: [phone] Mobile Phone: [phone] Relation: Daughter Secondary Emergency Contact: Reed,Tilden  [place] of [place] Home Phone: [phone] Mobile Phone: [phone] Relation: Relative  Code Status:  DNR Goals of care: Advanced Directive information Advanced Directives 03/16/2018  Does Patient Have a Medical Advance Directive? Yes  Type of Estate agent of [place];Out of facility DNR (pink MOST or yellow form)  Does patient want to make changes to medical advance directive? No - Patient declined  Copy of Healthcare Power of Attorney in Chart? Yes  Pre-existing out of facility DNR order (yellow form or pink MOST form) Yellow form placed in chart (order not valid for inpatient use)     Chief Complaint  Patient presents with  . Medical Management of Chronic Issues    HPI:  Pt is a 85 y.o. female seen today for medical management of chronic diseases.  She resides in skilled care. She has a hx of multiple CVAs with progressive leg weakness, dysarthria, and dysphagia. She has carotid stenosis and PVD as wellb ut we are no longer sending her out for monitoring with ultrasound due to her goals of care.  She remains able to communicate but has worsening short term memory loss over time. She is on a modified diet D2 with HTL and is tolerating fairly well with no fever, sob, or decreased 02 sats. Occasional cough noted with  meals per staff.  She has chronic pain to her neck, knees, and back but states that it is fairly well controlled at this time. She is sleeping well, weight down 3 lbs in the past month but intake is pretty good.  Her nurse reports that she is more confused over time but she continues to be able to communicate her needs.    Past Medical History:  Diagnosis Date  . Abnormality of gait   . Allergic rhinitis   . Anxiety   . Arthritis   . Carotid stenosis, right    asymptomatic  <40%  right  ica  and right eca  . Carpal tunnel syndrome on both sides   . Cerebrovascular accident (CVA) due to thrombosis of right middle cerebral artery (HCC)   . Claudication, intermittent (HCC)   . COPD (chronic obstructive pulmonary disease) (HCC)   . Diverticulosis of colon   . Fracture, calcaneus closed   . History of esophagitis   . History of hypothyroidism   . Hypertension   . Internal hemorrhoids   . Irritable bowel syndrome 03/11/2010  . PAD (peripheral artery disease) (HCC) monitored by dr Rubye Oaks   moderate  . Peripheral vascular disease (HCC)   . Stroke (HCC)   . Unspecified hereditary and idiopathic peripheral neuropathy   . Urge urinary incontinence    Past Surgical History:  Procedure Laterality Date  . CAROTID ENDARTERECTOMY Left 11-18-2007  . CATARACT EXTRACTION W/ INTRAOCULAR LENS IMPLANT Right 11/2012  . COLONOSCOPY  2005   mild diverticulosis  . CYSTOSCOPY WITH INJECTION N/A 09/06/2013   Procedure: CYSTOSCOPY  WITH BOTOX  INJECTION;  Surgeon: Martina Sinner, MD;  [facility]: [facility];  Service: Urology;  Laterality: N/A;  . DILATION AND CURETTAGE OF UTERUS  1973    Allergies  Allergen Reactions  . Tramadol Nausea Only    Outpatient Encounter Medications as of 09/20/2018  Medication Sig  . bisacodyl (DULCOLAX) 10 MG suppository Place 10 mg rectally daily as needed for moderate constipation.  . docusate (COLACE) 50 MG/5ML liquid Take 10 mg by mouth daily.   Marland Kitchen morphine (ROXANOL) 20 MG/ML concentrated solution Take 0.25 mLs (5 mg total) by mouth every 4 (four) hours as needed for severe pain.  . polyethylene glycol powder (GLYCOLAX/MIRALAX) powder Take 17 g by mouth daily. Mix in 8 oz liquid and drink   No facility-administered encounter medications on file as of 09/20/2018.     Review of Systems  Constitutional: Negative for activity change, appetite change, chills, diaphoresis, fatigue, fever and unexpected weight change.  HENT: Negative for congestion.   Respiratory: Negative for cough, shortness of breath and wheezing.   Cardiovascular: Negative for chest pain, palpitations and leg swelling.  Gastrointestinal: Negative for abdominal distention, abdominal pain, constipation and diarrhea.  Genitourinary: Negative for difficulty urinating and dysuria.  Musculoskeletal: Positive for arthralgias and gait problem. Negative for back pain, joint swelling and myalgias.  Neurological: Positive for speech difficulty and weakness. Negative for dizziness, tremors, seizures, syncope, facial asymmetry, light-headedness, numbness and headaches.  Psychiatric/Behavioral: Positive for confusion. Negative for agitation and behavioral problems.    Immunization History  Administered Date(s) Administered  . Influenza Inj Mdck Quad Pf 03/13/2016  . Influenza,inj,Quad PF,6+ Mos 03/16/2018  . Influenza-Unspecified 03/09/2013, 03/01/2014, 03/08/2015, 03/19/2017  . PPD Test 12/04/2011  . Pneumococcal Conjugate-13 09/12/2014  . Pneumococcal Polysaccharide-23 09/12/2015  . Td 06/03/2016  . Zoster Recombinat (Shingrix) 06/26/2017   Pertinent  Health Maintenance Due  Topic Date Due  . INFLUENZA VACCINE  12/25/2018  . PNA vac Low Risk Adult  Completed   Fall Risk  09/16/2017 10/21/2016 09/10/2016 02/18/2016 09/12/2015  Falls in the past year? No Yes Yes No No  Number falls in past yr: - 1 1 - -  Injury with Fall? - No No - -  Comment - - - - -  Risk for fall due to  : - - History of fall(s) - -  Follow up - - Education provided - -   Functional Status Survey:    Vitals:   09/20/18 1405  Temp: (!) 96.1 F (35.6 C)  Weight: 159 lb 4.8 oz (72.3 kg)   Body mass index is 26.51 kg/m.  Wt Readings from Last 3 Encounters:  09/20/18 159 lb 4.8 oz (72.3 kg)  08/24/18 162 lb (73.5 kg)  07/30/18 162 lb (73.5 kg)   Physical Exam Vitals signs reviewed.  Constitutional:      Appearance: Normal appearance.  Pulmonary:     Effort: Pulmonary effort is normal. No respiratory distress.  Musculoskeletal:     Right lower leg: No edema.     Left lower leg: No edema.  Neurological:     Mental Status: She is alert. Mental status is at baseline.     Comments: Oriented to self and place. Able to answer q's. Speech slurred.   Psychiatric:        Mood and Affect: Mood normal.     Labs reviewed: Recent Labs    04/13/18  NA 143  K 4.3  BUN 29*  CREATININE 0.6   Recent Labs  04/13/18  AST 22  ALT 25  ALKPHOS 107   Recent Labs    04/13/18  WBC 5.8  HGB 13.2  HCT 40  PLT 177   Lab Results  Component Value Date   TSH 4.50 04/13/2018   Lab Results  Component Value Date   HGBA1C 6.0 (H) 10/14/2015   Lab Results  Component Value Date   CHOL 166 11/17/2016   HDL 106 (A) 11/17/2016   LDLCALC 53 11/17/2016   TRIG 36 (A) 11/17/2016   CHOLHDL 3.2 10/14/2015    Significant Diagnostic Results in last 30 days:  No results found.  Assessment/Plan  1. Cerebrovascular disease She has had multiple strokes and resides in skilled care. Her goals of care are comfort based. We will continue to provide assistance due to weakness associated with previous CVAs and monitor for new issues.   2. Essential hypertension Controlled Not currently on meds  3. Mixed Alzheimer's and vascular dementia (HCC) Progressive decline in function, speech, memory, and swallow function.  4. Oropharyngeal dysphagia Continue D2 diet with HTL Continue asp prec  Supportive care, no antibiotics for infections per daughter  5. Generalized osteoarthritis of multiple sites Controlled with as needed roxanol   6. Other polyneuropathy No reports of pain or numbness   7. Slow transit constipation Controlled with colace and miralax  encourage fluid intake and fiber.   Family/ staff Communication: resident and staff  Labs/tests ordered:  NA

## 2018-10-11 ENCOUNTER — Encounter: Payer: Self-pay | Admitting: Adult Health

## 2018-10-11 ENCOUNTER — Non-Acute Institutional Stay (SKILLED_NURSING_FACILITY): Payer: Medicare Other | Admitting: Adult Health

## 2018-10-11 DIAGNOSIS — R1312 Dysphagia, oropharyngeal phase: Secondary | ICD-10-CM | POA: Diagnosis not present

## 2018-10-11 DIAGNOSIS — F0280 Dementia in other diseases classified elsewhere without behavioral disturbance: Secondary | ICD-10-CM

## 2018-10-11 DIAGNOSIS — I679 Cerebrovascular disease, unspecified: Secondary | ICD-10-CM

## 2018-10-11 DIAGNOSIS — G309 Alzheimer's disease, unspecified: Secondary | ICD-10-CM

## 2018-10-11 DIAGNOSIS — M159 Polyosteoarthritis, unspecified: Secondary | ICD-10-CM

## 2018-10-11 DIAGNOSIS — R634 Abnormal weight loss: Secondary | ICD-10-CM

## 2018-10-11 DIAGNOSIS — F0150 Vascular dementia without behavioral disturbance: Secondary | ICD-10-CM

## 2018-10-11 NOTE — Progress Notes (Signed)
Location:  Medical illustrator of Service:  SNF (31) Provider:   Peggye Ley, ANP [facility] [phone]   [name], DO  Patient Care Team: [name], DO as PCP - General (Geriatric Medicine) Community, Well [name], [name], NP as Nurse Practitioner (Nurse Practitioner) Marvel Plan, MD as Consulting Physician (Neurology)  Extended Emergency Contact Information Primary Emergency Contact: [name] [place] [address] [address] of [place] Home Phone: [phone] Mobile Phone: [phone] Relation: Daughter Secondary Emergency Contact: Reed,Tilden  [place] of [place] Home Phone: [phone] Mobile Phone: [phone] Relation: Relative  Code Status:  DNR Goals of care: Advanced Directive information Advanced Directives 03/16/2018  Does Patient Have a Medical Advance Directive? Yes  Type of Estate agent of [place];Out of facility DNR (pink MOST or yellow form)  Does patient want to make changes to medical advance directive? No - Patient declined  Copy of Healthcare Power of Attorney in Chart? Yes  Pre-existing out of facility DNR order (yellow form or pink MOST form) Yellow form placed in chart (order not valid for inpatient use)     Chief Complaint  Patient presents with  . Medical Management of Chronic Issues    HPI:  Pt is a 85 y.o. female seen today for medical management of chronic diseases.   Ms. Schlup has had multiple CVAs which has left her with dysphagia, weakness, and decreased memory. She is on a D 2 diet with HTL and tolerating well. She drools regularly. She is losing weight, 20 lbs since Jan. She is eating less.  She has chronic pain in her neck, back, and knees which used to be more bothersome to her but not as much recently. She is taking morphine for severe pain and comfort (7 x in the past month).    Past Medical History:  Diagnosis Date  .  Abnormality of gait   . Allergic rhinitis   . Anxiety   . Arthritis   . Carotid stenosis, right    asymptomatic  <40%  right  ica  and right eca  . Carpal tunnel syndrome on both sides   . Cerebrovascular accident (CVA) due to thrombosis of right middle cerebral artery (HCC)   . Claudication, intermittent (HCC)   . COPD (chronic obstructive pulmonary disease) (HCC)   . Diverticulosis of colon   . Fracture, calcaneus closed   . History of esophagitis   . History of hypothyroidism   . Hypertension   . Internal hemorrhoids   . Irritable bowel syndrome 03/11/2010  . PAD (peripheral artery disease) (HCC) monitored by dr Rubye Oaks   moderate  . Peripheral vascular disease (HCC)   . Stroke (HCC)   . Unspecified hereditary and idiopathic peripheral neuropathy   . Urge urinary incontinence    Past Surgical History:  Procedure Laterality Date  . CAROTID ENDARTERECTOMY Left 11-18-2007  . CATARACT EXTRACTION W/ INTRAOCULAR LENS IMPLANT Right 11/2012  . COLONOSCOPY  2005   mild diverticulosis  . CYSTOSCOPY WITH INJECTION N/A 09/06/2013   Procedure: CYSTOSCOPY WITH BOTOX  INJECTION;  Surgeon: Martina Sinner, MD;  Location: [facility];  Service: Urology;  Laterality: N/A;  . DILATION AND CURETTAGE OF UTERUS  1973    Allergies  Allergen Reactions  . Tramadol Nausea Only    Outpatient Encounter Medications as of 10/11/2018  Medication Sig  . feeding supplement, ENSURE, (ENSURE) PUDG Take 1 Container by  mouth daily.  Marland Kitchen NUTRITIONAL SUPPLEMENTS PO Take 1 Container by mouth daily. Magic cup with dinner  . senna-docusate (SENOKOT-S) 8.6-50 MG tablet Take 2 tablets by mouth 2 (two) times daily.  . bisacodyl (DULCOLAX) 10 MG suppository Place 10 mg rectally daily as needed for moderate constipation.  Marland Kitchen morphine (ROXANOL) 20 MG/ML concentrated solution Take 0.25 mLs (5 mg total) by mouth every 4 (four) hours as needed for severe pain.  . [DISCONTINUED] docusate (COLACE) 50  MG/5ML liquid Take 10 mg by mouth daily.  . [DISCONTINUED] polyethylene glycol powder (GLYCOLAX/MIRALAX) powder Take 17 g by mouth daily. Mix in 8 oz liquid and drink   No facility-administered encounter medications on file as of 10/11/2018.     Review of Systems  Constitutional: Negative for activity change, appetite change, chills, diaphoresis, fatigue, fever and unexpected weight change.  HENT: Negative for congestion.   Respiratory: Negative for cough, shortness of breath and wheezing.   Cardiovascular: Negative for chest pain, palpitations and leg swelling.  Gastrointestinal: Negative for abdominal distention, abdominal pain, constipation and diarrhea.  Genitourinary: Negative for difficulty urinating and dysuria.  Musculoskeletal: Positive for arthralgias and gait problem. Negative for back pain, joint swelling and myalgias.  Neurological: Positive for weakness. Negative for dizziness, tremors, seizures, syncope, facial asymmetry, speech difficulty, light-headedness, numbness and headaches.  Psychiatric/Behavioral: Positive for confusion. Negative for agitation and behavioral problems.    Immunization History  Administered Date(s) Administered  . Influenza Inj Mdck Quad Pf 03/13/2016  . Influenza,inj,Quad PF,6+ Mos 03/16/2018  . Influenza-Unspecified 03/09/2013, 03/01/2014, 03/08/2015, 03/19/2017  . PPD Test 12/04/2011  . Pneumococcal Conjugate-13 09/12/2014  . Pneumococcal Polysaccharide-23 09/12/2015  . Td 06/03/2016  . Zoster Recombinat (Shingrix) 06/26/2017   Pertinent  Health Maintenance Due  Topic Date Due  . INFLUENZA VACCINE  12/25/2018  . PNA vac Low Risk Adult  Completed   Fall Risk  09/16/2017 10/21/2016 09/10/2016 02/18/2016 09/12/2015  Falls in the past year? No Yes Yes No No  Number falls in past yr: - 1 1 - -  Injury with Fall? - No No - -  Comment - - - - -  Risk for fall due to : - - History of fall(s) - -  Follow up - - Education provided - -   Functional  Status Survey:    Vitals:   10/11/18 1628  Weight: 153 lb 12.8 oz (69.8 kg)   Body mass index is 25.59 kg/m. Physical Exam Vitals signs and nursing note reviewed.  Constitutional:      General: She is not in acute distress.    Appearance: She is not diaphoretic.  HENT:     Head: Normocephalic and atraumatic.  Neck:     Vascular: No JVD.  Cardiovascular:     Rate and Rhythm: Normal rate and regular rhythm.     Heart sounds: No murmur.  Pulmonary:     Effort: Pulmonary effort is normal. No respiratory distress.     Breath sounds: Rhonchi (RUL and RLL) present. No wheezing.  Musculoskeletal:        General: Deformity (kyphosis) present. No swelling.     Right lower leg: No edema.     Left lower leg: No edema.     Comments: BUE 4/5 LLE 3/5 RLE4/5  Skin:    General: Skin is warm and dry.  Neurological:     Mental Status: She is alert. Mental status is at baseline.     Comments: Oriented to self and place but not time.  No facial droop.      Labs reviewed: Recent Labs    04/13/18  NA 143  K 4.3  BUN 29*  CREATININE 0.6   Recent Labs    04/13/18  AST 22  ALT 25  ALKPHOS 107   Recent Labs    04/13/18  WBC 5.8  HGB 13.2  HCT 40  PLT 177   Lab Results  Component Value Date   TSH 4.50 04/13/2018   Lab Results  Component Value Date   HGBA1C 6.0 (H) 10/14/2015   Lab Results  Component Value Date   CHOL 166 11/17/2016   HDL 106 (A) 11/17/2016   LDLCALC 53 11/17/2016   TRIG 36 (A) 11/17/2016   CHOLHDL 3.2 10/14/2015    Significant Diagnostic Results in last 30 days:  No results found.  Assessment/Plan 1. Oropharyngeal dysphagia Continue D 2 diet with HTL , asp prec No feeding tubes, no antibiotics and DNR status.   2. Weight loss Due to CVA with worsening dysphagia and cognition  Nutritional supplements as indicated by dietician  3. Cerebrovascular disease S/p multiple CVAs now on comfort measures only  4. Mixed Alzheimer's and vascular  dementia (HCC) Progressive decline in cognition and physical function c/w the disease. Continue supportive care in the skilled environment.  5. Generalized osteoarthritis of multiple sites Controlled per resident Continue morphine 5 mg q 4 hrs prn for comfort     Family/ staff Communication:staff  Labs/tests ordered:  NA

## 2018-11-01 ENCOUNTER — Non-Acute Institutional Stay (SKILLED_NURSING_FACILITY): Payer: Medicare Other | Admitting: Nurse Practitioner

## 2018-11-01 ENCOUNTER — Encounter: Payer: Self-pay | Admitting: Internal Medicine

## 2018-11-01 ENCOUNTER — Encounter: Payer: Self-pay | Admitting: Nurse Practitioner

## 2018-11-01 DIAGNOSIS — K219 Gastro-esophageal reflux disease without esophagitis: Secondary | ICD-10-CM | POA: Diagnosis not present

## 2018-11-01 DIAGNOSIS — K5901 Slow transit constipation: Secondary | ICD-10-CM | POA: Diagnosis not present

## 2018-11-01 NOTE — Progress Notes (Signed)
Patient ID: Tara Walters, female   DOB: 1921-09-30, 85 y.o.   MRN: [id number]  Location:  [place] Room Number: 130 Place of Service:  SNF (31)  Gayland Curry, DO  Patient Care Team: Gayland Curry, DO as PCP - General (Geriatric Medicine) Community, Well Angelique Holm, Margreta Journey, NP as Nurse Practitioner (Nurse Practitioner) Rosalin Hawking, MD as Consulting Physician (Neurology)  Extended Emergency Contact Information Primary Emergency Contact: Lynden Oxford [place] [address] [address] of [place] Phone: [phone] Mobile Phone: [phone] Relation: Daughter Secondary Emergency Contact: Reed,Tilden  Johnnette [place]itter of [place] Phone: [phone] Mobile Phone: [phone] Relation: Relative  Code Status:  DNR Goals of care: Advanced Directive information Advanced Directives 03/16/2018  Does Patient Have a Medical Advance Directive? Yes  Type of Paramedic of [place];Out of facility DNR (pink MOST or yellow form)  Does patient want to make changes to medical advance directive? No - Patient declined  Copy of [place] in Chart? Yes  Pre-existing out of facility DNR order (yellow form or pink MOST form) Yellow form placed in chart (order not valid for inpatient use)     Chief Complaint  Patient presents with  . Acute Visit    Nausea    HPI:  Pt is a 85 y.o. female seen today for an acute visit for ongoing nausea per nursing staff.  Pt with a hx of GERD and was previously on alum & mag hydroxide-simeth ([place]) [phone] MG/5ML suspension which she could take daily PRN heartburn and was very effective per nursing. Pt admits to having increase in indigestion at this time and reports it is almost a daily event. States that she would rather be on a medication PRN vs daily scheduled medication at this time.  Has symptoms throughout the day but mostly in the morning. No  vomiting, diarrhea but reports ongoing constipation and having to strain with BMs. Taking senokot-s daily at this time    Past Medical History:  Diagnosis Date  . Abnormality of gait   . Allergic rhinitis   . Anxiety   . Arthritis   . Carotid stenosis, right    asymptomatic  <40%  right  ica  and right eca  . Carpal tunnel syndrome on both sides   . Cerebrovascular accident (CVA) due to thrombosis of right middle cerebral artery ([place])   . Claudication, intermittent ([place])   . COPD (chronic obstructive pulmonary disease) ([place])   . Diverticulosis of colon   . Fracture, calcaneus closed   . History of esophagitis   . History of hypothyroidism   . Hypertension   . Internal hemorrhoids   . Irritable bowel syndrome 03/11/2010  . PAD (peripheral artery disease) ([place]) monitored by dr Bobette Mo   moderate  . Peripheral vascular disease ([place])   . Stroke ([place])   . Unspecified hereditary and idiopathic peripheral neuropathy   . Urge urinary incontinence    Past Surgical History:  Procedure Laterality Date  . CAROTID ENDARTERECTOMY Left 11-18-2007  . CATARACT EXTRACTION W/ INTRAOCULAR LENS IMPLANT Right 11/2012  . COLONOSCOPY  2005   mild diverticulosis  . CYSTOSCOPY WITH INJECTION N/A 09/06/2013   Procedure: CYSTOSCOPY WITH BOTOX  INJECTION;  Surgeon: Reece Packer, MD;  Location: [place];  Service: Urology;  Laterality: N/A;  . DILATION AND CURETTAGE OF UTERUS  1973    Allergies  Allergen Reactions  .  Tramadol Nausea Only    Outpatient Encounter Medications as of 11/01/2018  Medication Sig  . bisacodyl (DULCOLAX) 10 MG suppository Place 10 mg rectally daily as needed for moderate constipation.  Marland Kitchen. morphine (ROXANOL) 20 MG/ML concentrated solution Take 0.25 mLs (5 mg total) by mouth every 4 (four) hours as needed for severe pain.  Marland Kitchen. senna-docusate (SENOKOT-S) 8.6-50 MG tablet Take 2 tablets by mouth 2 (two) times daily.  Marland Kitchen. ZINC OXIDE, TOPICAL, 10 % CREA Apply  topically as needed.   No facility-administered encounter medications on file as of 11/01/2018.     Review of Systems  Constitutional: Positive for appetite change (nursing reports she eats very little in the evening). Negative for activity change, chills, diaphoresis, fatigue, fever and unexpected weight change.  HENT: Negative for congestion.   Respiratory: Negative for cough, shortness of breath and wheezing.   Cardiovascular: Negative for chest pain, palpitations and leg swelling.  Gastrointestinal: Positive for constipation. Negative for abdominal distention, abdominal pain, diarrhea and vomiting.       GERD  Genitourinary: Negative for difficulty urinating and dysuria.    Immunization History  Administered Date(s) Administered  . Influenza Inj Mdck Quad Pf 03/13/2016  . Influenza,inj,Quad PF,6+ Mos 03/16/2018  . Influenza-Unspecified 03/09/2013, 03/01/2014, 03/08/2015, 03/19/2017  . PPD Test 12/04/2011  . Pneumococcal Conjugate-13 09/12/2014  . Pneumococcal Polysaccharide-23 09/12/2015  . Td 06/03/2016  . Zoster Recombinat (Shingrix) 06/26/2017   Pertinent  Health Maintenance Due  Topic Date Due  . INFLUENZA VACCINE  12/25/2018  . PNA vac Low Risk Adult  Completed   Fall Risk  09/16/2017 10/21/2016 09/10/2016 02/18/2016 09/12/2015  Falls in the past year? No Yes Yes No No  Number falls in past yr: - 1 1 - -  Injury with Fall? - No No - -  Comment - - - - -  Risk for fall due to : - - History of fall(s) - -  Follow up - - Education provided - -   Functional Status Survey:    Vitals:   11/01/18 1011  BP: 114/87  Pulse: 64  Resp: 18  Temp: 98.5 F (36.9 C)  TempSrc: Oral  SpO2: 95%  Weight: 149 lb (67.6 kg)  Height: 5\' 5"  (1.651 m)   Body mass index is 24.79 kg/m. Physical Exam Vitals signs reviewed.  Constitutional:      General: She is not in acute distress.    Appearance: Normal appearance. She is normal weight. She is not toxic-appearing.  HENT:     Head:  Normocephalic and atraumatic.  Cardiovascular:     Rate and Rhythm: Normal rate and regular rhythm.     Pulses: Normal pulses.  Pulmonary:     Effort: Pulmonary effort is normal.     Breath sounds: Decreased breath sounds present.  Abdominal:     General: Bowel sounds are normal. There is no distension.     Tenderness: There is no abdominal tenderness.  Skin:    Coloration: Skin is pale.  Neurological:     Mental Status: She is alert. Mental status is at baseline.     Comments: Chronic left weakness; oriented to person, place, not time  Psychiatric:        Mood and Affect: Mood normal.     Labs reviewed: Recent Labs    04/13/18  NA 143  K 4.3  BUN 29*  CREATININE 0.6   Recent Labs    04/13/18  AST 22  ALT 25  ALKPHOS 107  Recent Labs    04/13/18  WBC 5.8  HGB 13.2  HCT 40  PLT 177   Lab Results  Component Value Date   TSH 4.50 04/13/2018   Lab Results  Component Value Date   HGBA1C 6.0 (H) 10/14/2015   Lab Results  Component Value Date   CHOL 166 11/17/2016   HDL 106 (A) 11/17/2016   LDLCALC 53 11/17/2016   TRIG 36 (A) 11/17/2016   CHOLHDL 3.2 10/14/2015    Significant Diagnostic Results in last 30 days:  No results found.  Assessment/Plan 1. Gastroesophageal reflux disease, esophagitis presence not specified -worsening symptoms, does not wish for scheduled medication.  -alum & mag hydroxide-simeth (MINTOX) 200-200-20 MG/5ML suspension 15 cc daily as needed heartburn/indigestion   2. Slow transit constipation Not controlled, Will increase senokot-s to BID to help with symptoms of straining with BM   Kania Regnier K. Biagio Borgubanks, AGNP  [facility] [phone]

## 2018-11-01 NOTE — Progress Notes (Signed)
A user error has taken place: encounter opened in error, closed for administrative reasons.

## 2018-11-01 NOTE — Progress Notes (Deleted)
Patient ID: Tara Walters, female   DOB: 08/20/1921, 85 y.o.   MRN: [id number]  Location:  [place] Room Number: 130 Place of Service:  SNF [phone]) Provider:  Gayland Curry, DO  Patient Care Team: Gayland Curry, DO as PCP - General (Geriatric Medicine) Community, [facility] Edgar Frisk, NP as Nurse Practitioner (Nurse Practitioner) Rosalin Hawking, MD as Consulting Physician (Neurology)  Extended Emergency Contact Information Primary Emergency Contact: Lynden [place] [address] Johnnette Litter of [place] Phone: [phone] Mobile Phone: [phone] Relation: Daughter Secondary Emergency Contact: Reed,Tilden  Johnnette Litter of [place] Phone: [phone] Mobile Phone: [phone] Relation: Relative  Code Status:  *** Goals of care: Advanced Directive information Advanced Directives 03/16/2018  Does Patient Have a Medical Advance Directive? Yes  Type of Paramedic of [place];Out of facility DNR (pink MOST or yellow form)  Does patient want to make changes to medical advance directive? No - Patient declined  Copy of [place] in Chart? Yes  Pre-existing out of facility DNR order (yellow form or pink MOST form) Yellow form placed in chart (order not valid for inpatient use)     Chief Complaint  Patient presents with  . Acute Visit    Nausea    HPI:  Pt is a 85 y.o. female seen today for an acute visit for    Past Medical History:  Diagnosis Date  . Abnormality of gait   . Allergic rhinitis   . Anxiety   . Arthritis   . Carotid stenosis, right    asymptomatic  <40%  right  ica  and right eca  . Carpal tunnel syndrome on both sides   . Cerebrovascular accident (CVA) due to thrombosis of right middle cerebral artery ([place])   . Claudication, intermittent ([place])   . COPD (chronic obstructive pulmonary disease) ([place])   . Diverticulosis of colon   .  Fracture, calcaneus closed   . History of esophagitis   . History of hypothyroidism   . Hypertension   . Internal hemorrhoids   . Irritable bowel syndrome 03/11/2010  . PAD (peripheral artery disease) ([place]) monitored by dr Bobette Mo   moderate  . Peripheral vascular disease ([place])   . Stroke ([place])   . Unspecified hereditary and idiopathic peripheral neuropathy   . Urge urinary incontinence    Past Surgical History:  Procedure Laterality Date  . CAROTID ENDARTERECTOMY Left 11-18-2007  . CATARACT EXTRACTION W/ INTRAOCULAR LENS IMPLANT Right 11/2012  . COLONOSCOPY  2005   mild diverticulosis  . CYSTOSCOPY WITH INJECTION N/A 09/06/2013   Procedure: CYSTOSCOPY WITH BOTOX  INJECTION;  Surgeon: Reece Packer, MD;  Location: [place];  Service: Urology;  Laterality: N/A;  . DILATION AND CURETTAGE OF UTERUS  1973    Allergies  Allergen Reactions  . Tramadol Nausea Only    Outpatient Encounter Medications as of 11/01/2018  Medication Sig  . bisacodyl (DULCOLAX) 10 MG suppository Place 10 mg rectally daily as needed for moderate constipation.  Marland Kitchen morphine (ROXANOL) 20 MG/ML concentrated solution Take 0.25 mLs (5 mg total) by mouth every 4 (four) hours as needed for severe pain.  Marland Kitchen senna-docusate (SENOKOT-S) 8.6-50 MG tablet Take 2 tablets by mouth 2 (two) times daily.  Marland Kitchen ZINC OXIDE, TOPICAL, 10 % CREA Apply topically as needed.  . [DISCONTINUED] feeding supplement, ENSURE, (ENSURE) PUDG Take 1 Container by mouth daily.  . [  DISCONTINUED] NUTRITIONAL SUPPLEMENTS PO Take 1 Container by mouth daily. Magic cup with dinner   No facility-administered encounter medications on file as of 11/01/2018.     Review of Systems  Immunization History  Administered Date(s) Administered  . Influenza Inj Mdck Quad Pf 03/13/2016  . Influenza,inj,Quad PF,6+ Mos 03/16/2018  . Influenza-Unspecified 03/09/2013, 03/01/2014, 03/08/2015, 03/19/2017  . PPD Test 12/04/2011  . Pneumococcal  Conjugate-13 09/12/2014  . Pneumococcal Polysaccharide-23 09/12/2015  . Td 06/03/2016  . Zoster Recombinat (Shingrix) 06/26/2017   Pertinent  Health Maintenance Due  Topic Date Due  . INFLUENZA VACCINE  12/25/2018  . PNA vac Low Risk Adult  Completed   Fall Risk  09/16/2017 10/21/2016 09/10/2016 02/18/2016 09/12/2015  Falls in the past year? No Yes Yes No No  Number falls in past yr: - 1 1 - -  Injury with Fall? - No No - -  Comment - - - - -  Risk for fall due to : - - History of fall(s) - -  Follow up - - Education provided - -   Functional Status Survey:    Vitals:   11/01/18 0949  BP: 114/87  Pulse: 64  Resp: 18  Temp: 98.5 F (36.9 C)  TempSrc: Oral  SpO2: 95%  Weight: 149 lb (67.6 kg)  Height: 5\' 5"  (1.651 m)   Body mass index is 24.79 kg/m. Physical Exam  Labs reviewed: Recent Labs    04/13/18  NA 143  K 4.3  BUN 29*  CREATININE 0.6   Recent Labs    04/13/18  AST 22  ALT 25  ALKPHOS 107   Recent Labs    04/13/18  WBC 5.8  HGB 13.2  HCT 40  PLT 177   Lab Results  Component Value Date   TSH 4.50 04/13/2018   Lab Results  Component Value Date   HGBA1C 6.0 (H) 10/14/2015   Lab Results  Component Value Date   CHOL 166 11/17/2016   HDL 106 (A) 11/17/2016   LDLCALC 53 11/17/2016   TRIG 36 (A) 11/17/2016   CHOLHDL 3.2 10/14/2015    Significant Diagnostic Results in last 30 days:  No results found.  Assessment/Plan There are no diagnoses linked to this encounter.   Family/ staff Communication: ***  Labs/tests ordered:  ***

## 2018-11-04 DIAGNOSIS — Z20828 Contact with and (suspected) exposure to other viral communicable diseases: Secondary | ICD-10-CM | POA: Diagnosis not present

## 2018-11-07 LAB — NOVEL CORONAVIRUS, NAA: SARS-CoV-2, NAA: NEGATIVE

## 2018-11-08 ENCOUNTER — Non-Acute Institutional Stay (SKILLED_NURSING_FACILITY): Payer: Medicare Other | Admitting: Adult Health

## 2018-11-08 DIAGNOSIS — K219 Gastro-esophageal reflux disease without esophagitis: Secondary | ICD-10-CM

## 2018-11-08 DIAGNOSIS — K5901 Slow transit constipation: Secondary | ICD-10-CM

## 2018-11-08 DIAGNOSIS — I679 Cerebrovascular disease, unspecified: Secondary | ICD-10-CM | POA: Diagnosis not present

## 2018-11-08 DIAGNOSIS — M159 Polyosteoarthritis, unspecified: Secondary | ICD-10-CM | POA: Diagnosis not present

## 2018-11-08 DIAGNOSIS — F5101 Primary insomnia: Secondary | ICD-10-CM | POA: Diagnosis not present

## 2018-11-09 ENCOUNTER — Encounter: Payer: Self-pay | Admitting: Adult Health

## 2018-11-09 NOTE — Progress Notes (Signed)
[facility]   Place of Service:  SNF (31) Provider:   Peggye Leyhristy Fadi Menter, ANP [facility] [phone]   Kermit Baloeed, Tiffany L, DO  Patient Care Team: Kermit Baloeed, Tiffany L, DO as PCP - General (Geriatric Medicine) [facility] Zebulun Deman, Trula Orehristina, NP as Nurse Practitioner (Nurse Practitioner) Marvel PlanXu, Jindong, MD as Consulting Physician (Neurology)  Extended Emergency Contact Information Primary Emergency Contact: Lanora ManisReed,April          [place] [id number] [place] Home Phone: [phone] Mobile Phone: [phone] Relation: Daughter Secondary Emergency Contact: Reed,[place]en  [place] Home Phone: [phone] Mobile Phone: [phone] Relation: Relative  Code Status:  DNR Goals of care: Advanced Directive information Advanced Directives 03/16/2018  Does Patient Have a Medical Advance Directive? Yes  Type of Estate agentAdvance Directive Healthcare Power of [place] GardensAtt[place];Out of facility DNR (pink MOST or yellow form)  Does patient want to make changes to medical advance directive? No - Patient declined  Copy of Healthcare Power of Attorney in Chart? Yes  Pre-existing out of facility DNR order (yellow form or pink MOST form) Yellow form placed in chart (order not valid for inpatient use)     Chief Complaint  Patient presents with  . Medical Management of Chronic Issues    HPI:  Pt is a 85 y.o. female seen today for medical management of chronic diseases.   She has a hx of cerebral vascular disease and multiple CVAs and has chronic left sided weakness and dysphagia. Her goals of care are comfort based.  She was seen earlier in the month for reflux and prescribed mintox. She denies any current symptoms of abd pain, indigestion, nausea, burning etc. Bowel movements are regular with senokot bid  She has chronic neck and back pain and uses prn roxanol for this reason which seems to help. For my visit she reports mid back pain  and request a heating pad, declined morphine.   Functional status: lift for transfers  Past Medical History:  Diagnosis Date  . Abnormality of gait   . Allergic rhinitis   . Anxiety   . Arthritis   . Carotid stenosis, right    asymptomatic  <40%  right  ica  and right eca  . Carpal tunnel syndrome on both sides   . Cerebrovascular accident (CVA) due to thrombosis of right middle cerebral artery (HCC)   . Claudication, intermittent (HCC)   . COPD (chronic obstructive pulmonary disease) (HCC)   . Diverticulosis of colon   . Fracture, calcaneus closed   . History of esophagitis   . History of hypothyroidism   . Hypertension   . Internal hemorrhoids   . Irritable bowel syndrome 03/11/2010  . PAD (peripheral artery disease) (HCC) monitored by dr Rubye [place]erson   moderate  . Peripheral vascular disease (HCC)   . Stroke (HCC)   . Unspecified hereditary and idiopathic peripheral neuropathy   . Urge urinary incontinence    Past Surgical History:  Procedure Laterality Date  . CAROTID ENDARTERECTOMY Left 11-18-2007  . CATARACT EXTRACTION W/ INTRAOCULAR LENS IMPLANT Right 11/2012  . COLONOSCOPY  2005   mild diverticulosis  . CYSTOSCOPY WITH INJECTION N/A 09/06/2013   Procedure: CYSTOSCOPY WITH BOTOX  INJECTION;  Surgeon: Martina SinnerScott A MacDiarmid, MD;  [facility];  Service: Urology;  Laterality: N/A;  . DILATION AND CURETTAGE OF UTERUS  1973    Allergies  Allergen Reactions  . Tramadol Nausea Only    Outpatient Encounter Medications as of  11/08/2018  Medication Sig  . alum & mag hydroxide-simeth (MAALOX/MYLANTA) 200-200-20 MG/5ML suspension Take 15 mLs by mouth daily as needed for indigestion or heartburn.  . bisacodyl (DULCOLAX) 10 MG suppository Place 10 mg rectally daily as needed for moderate constipation.  Marland Kitchen. morphine (ROXANOL) 20 MG/ML concentrated solution Take 0.25 mLs (5 mg total) by mouth every 4 (four) hours as needed for severe pain.  Marland Kitchen. senna-docusate  (SENOKOT-S) 8.6-50 MG tablet Take 2 tablets by mouth 2 (two) times daily.  Marland Kitchen. ZINC OXIDE, TOPICAL, 10 % CREA Apply topically as needed.   No facility-administered encounter medications on file as of 11/08/2018.     Review of Systems  Constitutional: Negative for activity change, appetite change, chills, diaphoresis, fatigue, fever and unexpected weight change.  HENT: Negative for congestion.   Respiratory: Negative for cough, shortness of breath and wheezing.   Cardiovascular: Negative for chest pain, palpitations and leg swelling.  Gastrointestinal: Negative for abdominal distention, abdominal pain, constipation and diarrhea.  Genitourinary: Negative for difficulty urinating and dysuria.  Musculoskeletal: Positive for arthralgias and gait problem. Negative for back pain, joint swelling and myalgias.  Neurological: Positive for tremors and weakness. Negative for dizziness, seizures, syncope, facial asymmetry, speech difficulty, light-headedness, numbness and headaches.  Psychiatric/Behavioral: Positive for confusion. Negative for agitation and behavioral problems.    Immunization History  Administered Date(s) Administered  . Influenza Inj Mdck Quad Pf 03/13/2016  . Influenza,inj,Quad PF,6+ Mos 03/16/2018  . Influenza-Unspecified 03/09/2013, 03/01/2014, 03/08/2015, 03/19/2017  . PPD Test 12/04/2011  . Pneumococcal Conjugate-13 09/12/2014  . Pneumococcal Polysaccharide-23 09/12/2015  . Td 06/03/2016  . Zoster Recombinat (Shingrix) 06/26/2017   Pertinent  Health Maintenance Due  Topic Date Due  . INFLUENZA VACCINE  12/25/2018  . PNA vac Low Risk Adult  Completed   Fall Risk  09/16/2017 10/21/2016 09/10/2016 02/18/2016 09/12/2015  Falls in the past year? No Yes Yes No No  Number falls in past yr: - 1 1 - -  Injury with Fall? - No No - -  Comment - - - - -  Risk for fall due to : - - History of fall(s) - -  Follow up - - Education provided - -   Functional Status Survey:    Vitals:    11/09/18 0809  Weight: 149 lb 14.4 oz (68 kg)   Body mass index is 24.94 kg/m. Physical Exam Vitals signs and nursing note reviewed.  Constitutional:      General: She is not in acute distress.    Appearance: She is not diaphoretic.  HENT:     Head: Normocephalic and atraumatic.  Neck:     Vascular: No JVD.  Cardiovascular:     Rate and Rhythm: Normal rate and regular rhythm.     Heart sounds: No murmur.  Pulmonary:     Effort: Pulmonary effort is normal. No respiratory distress.     Breath sounds: Normal breath sounds. No wheezing.  Abdominal:     General: Bowel sounds are normal.     Palpations: Abdomen is soft.     Tenderness: There is no abdominal tenderness. There is no right CVA tenderness or left CVA tenderness.  Musculoskeletal:     Right lower leg: No edema.     Left lower leg: No edema.  Skin:    General: Skin is warm and dry.  Neurological:     Comments: Left sided weakness which is chronic. Alert but slightly sleepy. Oriented to self and place but not time.   Psychiatric:  Mood and Affect: Mood normal.     Labs reviewed: Recent Labs    04/13/18  NA 143  K 4.3  BUN 29*  CREATININE 0.6   Recent Labs    04/13/18  AST 22  ALT 25  ALKPHOS 107   Recent Labs    04/13/18  WBC 5.8  HGB 13.2  HCT 40  PLT 177   Lab Results  Component Value Date   TSH 4.50 04/13/2018   Lab Results  Component Value Date   HGBA1C 6.0 (H) 10/14/2015   Lab Results  Component Value Date   CHOL 166 11/17/2016   HDL 106 (A) 11/17/2016   LDLCALC 53 11/17/2016   TRIG 36 (A) 11/17/2016   CHOLHDL 3.2 10/14/2015    Significant Diagnostic Results in last 30 days:  No results found.  Assessment/Plan 1. Cerebrovascular disease With multiple CVAs and left sided weakness Appears more withdrawn and sleepy Goals of care comfort based Continue supportive care in the skilled environment   2. Gastroesophageal reflux disease without esophagitis Denies symptoms   Continue prn mintox  3. Generalized osteoarthritis of multiple sites Controlled with prn roxanol.   4. Primary insomnia Denies sleeping issues   5. Slow transit constipation Improved Continue senokot 2 tabs bid     Family/ staff Communication: staff  Labs/tests ordered:  NA

## 2018-12-14 ENCOUNTER — Non-Acute Institutional Stay (SKILLED_NURSING_FACILITY): Payer: Medicare Other | Admitting: Internal Medicine

## 2018-12-14 ENCOUNTER — Encounter: Payer: Self-pay | Admitting: Internal Medicine

## 2018-12-14 DIAGNOSIS — F0150 Vascular dementia without behavioral disturbance: Secondary | ICD-10-CM | POA: Diagnosis not present

## 2018-12-14 DIAGNOSIS — G309 Alzheimer's disease, unspecified: Secondary | ICD-10-CM | POA: Diagnosis not present

## 2018-12-14 DIAGNOSIS — I679 Cerebrovascular disease, unspecified: Secondary | ICD-10-CM | POA: Diagnosis not present

## 2018-12-14 DIAGNOSIS — M545 Low back pain, unspecified: Secondary | ICD-10-CM

## 2018-12-14 DIAGNOSIS — F0280 Dementia in other diseases classified elsewhere without behavioral disturbance: Secondary | ICD-10-CM | POA: Diagnosis not present

## 2018-12-14 DIAGNOSIS — R1312 Dysphagia, oropharyngeal phase: Secondary | ICD-10-CM | POA: Diagnosis not present

## 2018-12-14 DIAGNOSIS — Z0000 Encounter for general adult medical examination without abnormal findings: Secondary | ICD-10-CM

## 2018-12-14 DIAGNOSIS — G8929 Other chronic pain: Secondary | ICD-10-CM

## 2018-12-14 DIAGNOSIS — M159 Polyosteoarthritis, unspecified: Secondary | ICD-10-CM | POA: Diagnosis not present

## 2018-12-14 NOTE — Progress Notes (Signed)
Provider:  Gwenith Spitziffany L. Renato Gailseed, D.O., C.M.D. [facility] Place of Service:  SNF (31)  Previous PCP: Kermit Baloeed, Montserrat Shek L, DO Patient Care Team: Kermit Baloeed, Elijah Phommachanh L, DO as PCP - General (Geriatric Medicine) [facility], Christina, NP as Nurse Practitioner (Nurse Practitioner) Marvel PlanXu, Jindong, MD as Consulting Physician (Neurology)  Extended Emergency Contact Information Primary Emergency Contact: Lanora ManisReed,April          [place] [id number] [place] Home Phone: [phone] Mobile Phone: [phone] Relation: Daughter Secondary Emergency Contact: Aelyn Stanaland,[place]  [place] Home Phone: [phone] Mobile Phone: [phone] Relation: Relative  Code Status: DNR, MOST Goals of Care: Advanced Directive information Advanced Directives 03/16/2018  Does Patient Have a Medical Advance Directive? Yes  Type of Estate agentAdvance Directive Healthcare Power of [place];Out of facility DNR (pink MOST or yellow form)  Does patient want to make changes to medical advance directive? No - Patient declined  Copy of Healthcare Power of Attorney in Chart? Yes  Pre-existing out of facility DNR order (yellow form or pink MOST form) Yellow form placed in chart (order not valid for inpatient use)   Chief Complaint  Patient presents with  . Annual Exam    HPI: Patient is a 85 y.o. female seen today for an annual physical exam.  Review of nursing notes indicates she's had a need for morphine a couple of times in the past few nights--once for dyspnea and another time for headache.  covid-screening has been negative.  She has chronic back pain helped with heat.   Her osteoarthritis has been quite bothersome to her even prior to moving to [place] and then SNF.    Her cognition continues to decline.  She's dealt with declining appetite and weight loss, malaise, fatigue over the past year.  She is receiving comfort care at her  request.  She has a MOST form indicating this.    Past Medical History:  Diagnosis Date  . Abnormality of gait   . Allergic rhinitis   . Anxiety   . Arthritis   . Carotid stenosis, right    asymptomatic  <40%  right  ica  and right eca  . Carpal tunnel syndrome on both sides   . Cerebrovascular accident (CVA) due to thrombosis of right middle cerebral artery (HCC)   . Claudication, intermittent (HCC)   . COPD (chronic obstructive pulmonary disease) (HCC)   . Diverticulosis of colon   . Fracture, calcaneus closed   . History of esophagitis   . History of hypothyroidism   . Hypertension   . Internal hemorrhoids   . Irritable bowel syndrome 03/11/2010  . PAD (peripheral artery disease) (HCC) monitored by dr Rubye [place]   moderate  . Peripheral vascular disease (HCC)   . Stroke (HCC)   . Unspecified hereditary and idiopathic peripheral neuropathy   . Urge urinary incontinence    Past Surgical History:  Procedure Laterality Date  . CAROTID ENDARTERECTOMY Left 11-18-2007  . CATARACT EXTRACTION W/ INTRAOCULAR LENS IMPLANT Right 11/2012  . COLONOSCOPY  2005   mild diverticulosis  . CYSTOSCOPY WITH INJECTION N/A 09/06/2013   Procedure: CYSTOSCOPY WITH BOTOX  INJECTION;  Surgeon: Martina SinnerScott A MacDiarmid, MD;  [facility];  Service: Urology;  Laterality: N/A;  . DILATION AND CURETTAGE OF UTERUS  1973    reports that she quit smoking about 35 years ago. Her smoking use included cigarettes. She has a 40.00 pack-year smoking history. She has  never used smokeless tobacco. She reports that she does not drink alcohol or use drugs.  Functional Status Survey:  dependent except feeding  Family History  Problem Relation Age of Onset  . Heart attack Mother   . Cancer Father        Leukemia  . Diabetes Father     Health Maintenance  Topic Date Due  . INFLUENZA VACCINE  12/25/2018  . TETANUS/TDAP  06/03/2026  . PNA vac Low Risk Adult  Completed    Allergies   Allergen Reactions  . Tramadol Nausea Only    Outpatient Encounter Medications as of 12/14/2018  Medication Sig  . alum & mag hydroxide-simeth (MAALOX/MYLANTA) 200-200-20 MG/5ML suspension Take 15 mLs by mouth daily as needed for indigestion or heartburn.  . bisacodyl (DULCOLAX) 10 MG suppository Place 10 mg rectally daily as needed for moderate constipation.  Marland Kitchen. morphine (ROXANOL) 20 MG/ML concentrated solution Take 0.25 mLs (5 mg total) by mouth every 4 (four) hours as needed for severe pain.  Marland Kitchen. senna-docusate (SENOKOT-S) 8.6-50 MG tablet Take 2 tablets by mouth 2 (two) times daily.  Marland Kitchen. ZINC OXIDE, TOPICAL, 10 % CREA Apply topically as needed.   No facility-administered encounter medications on file as of 12/14/2018.     Review of Systems  Constitutional: Positive for malaise/fatigue. Negative for chills and fever.       Wt not recorded this month  HENT: Positive for hearing loss. Negative for congestion.   Eyes: Negative for blurred vision.  Respiratory: Negative for cough and shortness of breath.   Cardiovascular: Negative for chest pain, palpitations and leg swelling.  Gastrointestinal: Negative for abdominal pain, blood in stool, constipation, diarrhea and melena.  Genitourinary: Negative for dysuria.  Musculoskeletal: Positive for back pain, joint pain and neck pain. Negative for falls.  Skin: Negative for itching and rash.  Neurological: Negative for dizziness and loss of consciousness.  Endo/Heme/Allergies: Bruises/bleeds easily.  Psychiatric/Behavioral: Positive for memory loss. Negative for depression. The patient is not nervous/anxious and does not have insomnia.     Vitals:   12/14/18 1303  BP: (!) 146/84  Pulse: 73  Resp: 18  Temp: (!) 95.8 F (35.4 C)  SpO2: 96%  Weight: 149 lb 14.4 oz (68 kg)   Body mass index is 24.94 kg/m. Physical Exam Vitals signs reviewed.  Constitutional:      General: She is not in acute distress.    Appearance: Normal appearance.  She is normal weight. She is not toxic-appearing.  HENT:     Head: Normocephalic and atraumatic.     Right Ear: External ear normal.     Left Ear: External ear normal.     Nose: Nose normal.     Mouth/Throat:     Pharynx: Oropharynx is clear.  Eyes:     Extraocular Movements: Extraocular movements intact.     Conjunctiva/sclera: Conjunctivae normal.     Pupils: Pupils are equal, round, and reactive to light.  Cardiovascular:     Rate and Rhythm: Normal rate and regular rhythm.     Pulses: Normal pulses.     Heart sounds: Normal heart sounds.  Pulmonary:     Effort: Pulmonary effort is normal.     Breath sounds: Normal breath sounds.  Abdominal:     General: Bowel sounds are normal.     Palpations: There is no mass.     Tenderness: There is no abdominal tenderness. There is no guarding or rebound.  Musculoskeletal: Normal range of motion.  General: No tenderness.     Right lower leg: No edema.     Left lower leg: No edema.  Skin:    General: Skin is warm and dry.     Capillary Refill: Capillary refill takes less than 2 seconds.  Neurological:     Mental Status: She is alert. Mental status is at baseline.     Gait: Gait abnormal.     Comments: Mild left-sided weakness (not changed); uses manual wheelchair  Psychiatric:        Mood and Affect: Mood normal.     Labs reviewed: Basic Metabolic Panel: Recent Labs    04/13/18  NA 143  K 4.3  BUN 29*  CREATININE 0.6   Liver Function Tests: Recent Labs    04/13/18  AST 22  ALT 25  ALKPHOS 107   No results for input(s): LIPASE, AMYLASE in the last 8760 hours. No results for input(s): AMMONIA in the last 8760 hours. CBC: Recent Labs    04/13/18  WBC 5.8  HGB 13.2  HCT 40  PLT 177   Cardiac Enzymes: No results for input(s): CKTOTAL, CKMB, CKMBINDEX, TROPONINI in the last 8760 hours. BNP: Invalid input(s): POCBNP Lab Results  Component Value Date   HGBA1C 6.0 (H) 10/14/2015   Lab Results  Component  Value Date   TSH 4.50 04/13/2018   No results found for: VITAMINB12 No results found for: FOLATE No results found for: IRON, TIBC, FERRITIN  Assessment/Plan 1. Annual physical exam -performed today -up to date on age and goal-appropriate preventive care  2. Mixed Alzheimer's and vascular dementia ([place]) -progressive with declining mmse with more stroke events -cont SNF level supportive care -goals are comfort based -she requires assistance with all ADLs except feeds herself  3. Chronic midline low back pain without sciatica -cont heat as needed and prn morphine  4. Generalized osteoarthritis of multiple sites -pain being managed now with heat and morphine as hydrocodone, tylenol and topicals were not longer helpful  5. Cerebrovascular disease -ongoing, has had multiple strokes over the past few years and has poor circulation ongoing to the brain   6. Oropharyngeal dysphagia -cont modified diet with aspiration precautions  Labs/tests ordered:  No new  Tabrina Esty L. Ciera Beckum, D.O. [facility] [address], [place] [address] Cell Phone (Mon-Fri 8am-5pm):  [phone] On Call:  [phone] & follow prompts after 5pm & weekends Office Phone:  [phone] Office Fax:  [phone]

## 2018-12-23 ENCOUNTER — Other Ambulatory Visit: Payer: Self-pay

## 2019-01-07 ENCOUNTER — Non-Acute Institutional Stay (SKILLED_NURSING_FACILITY): Payer: Medicare Other | Admitting: Adult Health

## 2019-01-07 ENCOUNTER — Encounter: Payer: Self-pay | Admitting: Adult Health

## 2019-01-07 DIAGNOSIS — G309 Alzheimer's disease, unspecified: Secondary | ICD-10-CM

## 2019-01-07 DIAGNOSIS — R1310 Dysphagia, unspecified: Secondary | ICD-10-CM | POA: Diagnosis not present

## 2019-01-07 DIAGNOSIS — M159 Polyosteoarthritis, unspecified: Secondary | ICD-10-CM | POA: Diagnosis not present

## 2019-01-07 DIAGNOSIS — F0280 Dementia in other diseases classified elsewhere without behavioral disturbance: Secondary | ICD-10-CM

## 2019-01-07 DIAGNOSIS — R634 Abnormal weight loss: Secondary | ICD-10-CM

## 2019-01-07 DIAGNOSIS — F0150 Vascular dementia without behavioral disturbance: Secondary | ICD-10-CM

## 2019-01-07 DIAGNOSIS — I679 Cerebrovascular disease, unspecified: Secondary | ICD-10-CM | POA: Diagnosis not present

## 2019-01-07 NOTE — Progress Notes (Signed)
Location:  Occupational psychologist of Service:  SNF (31) Provider:  Cindi Carbon, ANP [place] [phone]  Gayland Curry, DO  Patient Care Team: Gayland Curry, DO as PCP - General (Geriatric Medicine) Community, Well Angelique Holm, Margreta Journey, NP as Nurse Practitioner (Nurse Practitioner) Rosalin Hawking, MD as Consulting Physician (Neurology)  Extended Emergency Contact Information Primary Emergency Contact: Lynden Oxford [place] [address] Johnnette Litter of [place] Phone: [phone] Mobile Phone: [phone] Relation: Daughter Secondary Emergency Contact: Reed,Tilden  Johnnette Litter of [place] Phone: [phone] Mobile Phone: [phone] Relation: Relative  Code Status:  DNR Goals of care: Advanced Directive information Advanced Directives 03/16/2018  Does Patient Have a Medical Advance Directive? Yes  Type of Paramedic of [place];Out of facility DNR (pink MOST or yellow form)  Does patient want to make changes to medical advance directive? No - Patient declined  Copy of [place] in Chart? Yes  Pre-existing out of facility DNR order (yellow form or pink MOST form) Yellow form placed in chart (order not valid for inpatient use)     Chief Complaint  Patient presents with  . Medical Management of Chronic Issues    HPI:  Pt is a 85 y.o. female seen today for medical management of chronic diseases.    She reports some chronic pain to her neck and hips which is relieved with morphine. Denies any constipation or abd pain. No fever or dysuria.  She is progressively more confused over time  No reported issues chewing or swallowing her food. No cough, sob, etc.   Needs more assistance with meals and set up.Progressive weakness and weight loss are noted.   Wt Readings from Last 3 Encounters:  01/07/19 138 lb 11.2 oz (62.9 kg)  12/14/18 149 lb 14.4 oz (68 kg)   11/09/18 149 lb 14.4 oz (68 kg)     Functional status: hoyer lift and incontinent Past Medical History:  Diagnosis Date  . Abnormality of gait   . Allergic rhinitis   . Anxiety   . Arthritis   . Carotid stenosis, right    asymptomatic  <40%  right  ica  and right eca  . Carpal tunnel syndrome on both sides   . Cerebrovascular accident (CVA) due to thrombosis of right middle cerebral artery ([place])   . Claudication, intermittent ([place])   . COPD (chronic obstructive pulmonary disease) ([place])   . Diverticulosis of colon   . Fracture, calcaneus closed   . History of esophagitis   . History of hypothyroidism   . Hypertension   . Internal hemorrhoids   . Irritable bowel syndrome 03/11/2010  . PAD (peripheral artery disease) ([place]) monitored by dr Bobette Mo   moderate  . Peripheral vascular disease ([place])   . Stroke ([place])   . Unspecified hereditary and idiopathic peripheral neuropathy   . Urge urinary incontinence    Past Surgical History:  Procedure Laterality Date  . CAROTID ENDARTERECTOMY Left 11-18-2007  . CATARACT EXTRACTION W/ INTRAOCULAR LENS IMPLANT Right 11/2012  . COLONOSCOPY  2005   mild diverticulosis  . CYSTOSCOPY WITH INJECTION N/A 09/06/2013   Procedure: CYSTOSCOPY WITH BOTOX  INJECTION;  Surgeon: Reece Packer, MD;  Location: [facility];  Service: Urology;  Laterality: N/A;  . DILATION AND CURETTAGE OF UTERUS  1973    Allergies  Allergen Reactions  . Tramadol Nausea Only    Outpatient Encounter  Medications as of 01/07/2019  Medication Sig  . alum & mag hydroxide-simeth (MAALOX/MYLANTA) 200-200-20 MG/5ML suspension Take 15 mLs by mouth daily as needed for indigestion or heartburn.  . feeding supplement, ENSURE, (ENSURE) PUDG Take 1 Container by mouth daily as needed.  . senna-docusate (SENOKOT-S) 8.6-50 MG tablet Take 2 tablets by mouth 2 (two) times daily.  . bisacodyl (DULCOLAX) 10 MG suppository Place 10 mg rectally daily as needed for  moderate constipation.  Marland Kitchen. morphine (ROXANOL) 20 MG/ML concentrated solution Take 0.25 mLs (5 mg total) by mouth every 4 (four) hours as needed for severe pain.  Marland Kitchen. ZINC OXIDE, TOPICAL, 10 % CREA Apply topically as needed.   No facility-administered encounter medications on file as of 01/07/2019.     Review of Systems  Constitutional: Positive for unexpected weight change. Negative for activity change, appetite change, chills, diaphoresis, fatigue and fever.  HENT: Negative for congestion.   Respiratory: Negative for cough, shortness of breath and wheezing.   Cardiovascular: Negative for chest pain, palpitations and leg swelling.  Gastrointestinal: Negative for abdominal distention, abdominal pain, constipation and diarrhea.  Genitourinary: Negative for difficulty urinating and dysuria.  Musculoskeletal: Positive for arthralgias and gait problem. Negative for back pain, joint swelling and myalgias.  Neurological: Negative for dizziness, tremors, seizures, syncope, facial asymmetry, speech difficulty, weakness, light-headedness, numbness and headaches.  Psychiatric/Behavioral: Positive for confusion. Negative for agitation and behavioral problems.    Immunization History  Administered Date(s) Administered  . Influenza Inj Mdck Quad Pf 03/13/2016  . Influenza,inj,Quad PF,6+ Mos 03/16/2018  . Influenza-Unspecified 03/09/2013, 03/01/2014, 03/08/2015, 03/19/2017  . PPD Test 12/04/2011  . Pneumococcal Conjugate-13 09/12/2014  . Pneumococcal Polysaccharide-23 09/12/2015  . Td 06/03/2016  . Zoster Recombinat (Shingrix) 06/26/2017   Pertinent  Health Maintenance Due  Topic Date Due  . INFLUENZA VACCINE  12/25/2018  . PNA vac Low Risk Adult  Completed   Fall Risk  09/16/2017 10/21/2016 09/10/2016 02/18/2016 09/12/2015  Falls in the past year? No Yes Yes No No  Number falls in past yr: - 1 1 - -  Injury with Fall? - No No - -  Comment - - - - -  Risk for fall due to : - - History of fall(s) - -   Follow up - - Education provided - -   Functional Status Survey:    Vitals:   01/07/19 1337  Weight: 138 lb 11.2 oz (62.9 kg)   Body mass index is 23.08 kg/m. Physical Exam Vitals signs and nursing note reviewed.  Constitutional:      General: She is not in acute distress.    Appearance: She is not diaphoretic.     Comments: Frail and thin female  HENT:     Head: Normocephalic and atraumatic.  Neck:     Vascular: No JVD.  Cardiovascular:     Rate and Rhythm: Normal rate and regular rhythm.     Heart sounds: No murmur.  Pulmonary:     Effort: Pulmonary effort is normal. No respiratory distress.     Breath sounds: Normal breath sounds. No wheezing.  Abdominal:     General: Bowel sounds are normal. There is no distension.     Palpations: Abdomen is soft.  Skin:    General: Skin is warm and dry.  Neurological:     Mental Status: She is alert.     Comments: Oriented to self only. Generally weak, more so on the left leg.   Psychiatric:  Mood and Affect: Mood normal.     Labs reviewed: Recent Labs    04/13/18  NA 143  K 4.3  BUN 29*  CREATININE 0.6   Recent Labs    04/13/18  AST 22  ALT 25  ALKPHOS 107   Recent Labs    04/13/18  WBC 5.8  HGB 13.2  HCT 40  PLT 177   Lab Results  Component Value Date   TSH 4.50 04/13/2018   Lab Results  Component Value Date   HGBA1C 6.0 (H) 10/14/2015   Lab Results  Component Value Date   CHOL 166 11/17/2016   HDL 106 (A) 11/17/2016   LDLCALC 53 11/17/2016   TRIG 36 (A) 11/17/2016   CHOLHDL 3.2 10/14/2015    Significant Diagnostic Results in last 30 days:  No results found.  Assessment/Plan 1. Mixed Alzheimer's and vascular dementia (HCC) Progressive decline in cognition and physical function c/w the disease. Continue supportive care in the skilled environment.  2. Weight loss Due to progressive dementia and dysphagia Magic up and ensure pudding ordered as needed to supplement meal   3.  Dysphagia, unspecified type Continue D2 diet with HTL Asp prec  1:1 supervision and no straws.  4. Cerebral vascular disease She has had multiple CVAs which has led to weakness and progression in dementia. Her goals of care are comfort based and she is a DNR. No further labs or screening indicated.   5. Generalized osteoarthritis of multiple sites Controlled with as needed morphine  Monitor for constipation    Family/ staff Communication: staff  Labs/tests ordered:  NA

## 2019-01-27 ENCOUNTER — Non-Acute Institutional Stay (SKILLED_NURSING_FACILITY): Payer: Medicare Other | Admitting: Adult Health

## 2019-01-27 ENCOUNTER — Encounter: Payer: Self-pay | Admitting: Adult Health

## 2019-01-27 DIAGNOSIS — M25531 Pain in right wrist: Secondary | ICD-10-CM | POA: Diagnosis not present

## 2019-01-27 NOTE — Progress Notes (Signed)
Location:  Occupational psychologist of Service:  SNF (31) Provider:   Cindi Carbon, ANP [place] [phone]   Gayland Curry, DO  Patient Care Team: Gayland Curry, DO as PCP - General (Geriatric Medicine) Community, Well Angelique Holm, Margreta Journey, NP as Nurse Practitioner (Nurse Practitioner) Rosalin Hawking, MD as Consulting Physician (Neurology)  Extended Emergency Contact Information Primary Emergency Contact: Lynden Oxford [place] [address] Johnnette Litter of [place] Phone: [phone] Mobile Phone: [phone] Relation: Daughter Secondary Emergency Contact: Reed,Tilden  Johnnette Litter of [place] Phone: [phone] Mobile Phone: [phone] Relation: Relative  Code Status:  DNR Goals of care: Advanced Directive information Advanced Directives 03/16/2018  Does Patient Have a Medical Advance Directive? Yes  Type of Paramedic of [place];Out of facility DNR (pink MOST or yellow form)  Does patient want to make changes to medical advance directive? No - Patient declined  Copy of [place] in Chart? Yes  Pre-existing out of facility DNR order (yellow form or pink MOST form) Yellow form placed in chart (order not valid for inpatient use)     Chief Complaint  Patient presents with  . Acute Visit    wrist pain    HPI:  Pt is a 85 y.o. female seen today for an acute visit for right wrist pain. The pain has been present for two days and associated with redness and swelling. No fever. No reported injury. She had this one before in march and it responded well to prednisone.    Past Medical History:  Diagnosis Date  . Abnormality of gait   . Allergic rhinitis   . Anxiety   . Arthritis   . Carotid stenosis, right    asymptomatic  <40%  right  ica  and right eca  . Carpal tunnel syndrome on both sides   . Cerebrovascular accident (CVA) due to thrombosis of right middle  cerebral artery ([place])   . Claudication, intermittent ([place])   . COPD (chronic obstructive pulmonary disease) ([place])   . Diverticulosis of colon   . Fracture, calcaneus closed   . History of esophagitis   . History of hypothyroidism   . Hypertension   . Internal hemorrhoids   . Irritable bowel syndrome 03/11/2010  . PAD (peripheral artery disease) ([place]) monitored by dr Bobette Mo   moderate  . Peripheral vascular disease ([place])   . Stroke ([place])   . Unspecified hereditary and idiopathic peripheral neuropathy   . Urge urinary incontinence    Past Surgical History:  Procedure Laterality Date  . CAROTID ENDARTERECTOMY Left 11-18-2007  . CATARACT EXTRACTION W/ INTRAOCULAR LENS IMPLANT Right 11/2012  . COLONOSCOPY  2005   mild diverticulosis  . CYSTOSCOPY WITH INJECTION N/A 09/06/2013   Procedure: CYSTOSCOPY WITH BOTOX  INJECTION;  Surgeon: Reece Packer, MD;  Location: [facility];  Service: Urology;  Laterality: N/A;  . DILATION AND CURETTAGE OF UTERUS  1973    Allergies  Allergen Reactions  . Tramadol Nausea Only    Outpatient Encounter Medications as of 01/27/2019  Medication Sig  . alum & mag hydroxide-simeth (MAALOX/MYLANTA) 200-200-20 MG/5ML suspension Take 15 mLs by mouth daily as needed for indigestion or heartburn.  . bisacodyl (DULCOLAX) 10 MG suppository Place 10 mg rectally daily as needed for moderate constipation.  . feeding supplement, ENSURE, (ENSURE) PUDG Take 1 Container by mouth daily as needed.  Marland Kitchen morphine (ROXANOL) 20  MG/ML concentrated solution Take 0.25 mLs (5 mg total) by mouth every 4 (four) hours as needed for severe pain.  Marland Kitchen. senna-docusate (SENOKOT-S) 8.6-50 MG tablet Take 2 tablets by mouth 2 (two) times daily.  Marland Kitchen. ZINC OXIDE, TOPICAL, 10 % CREA Apply topically as needed.   No facility-administered encounter medications on file as of 01/27/2019.     Review of Systems  Constitutional: Positive for unexpected weight change. Negative for  activity change, appetite change, chills, diaphoresis, fatigue and fever.  Musculoskeletal: Positive for arthralgias, gait problem and joint swelling.    Immunization History  Administered Date(s) Administered  . Influenza Inj Mdck Quad Pf 03/13/2016  . Influenza,inj,Quad PF,6+ Mos 03/16/2018  . Influenza-Unspecified 03/09/2013, 03/01/2014, 03/08/2015, 03/19/2017  . PPD Test 12/04/2011  . Pneumococcal Conjugate-13 09/12/2014  . Pneumococcal Polysaccharide-23 09/12/2015  . Td 06/03/2016  . Zoster Recombinat (Shingrix) 06/26/2017   Pertinent  Health Maintenance Due  Topic Date Due  . INFLUENZA VACCINE  12/25/2018  . PNA vac Low Risk Adult  Completed   Fall Risk  09/16/2017 10/21/2016 09/10/2016 02/18/2016 09/12/2015  Falls in the past year? No Yes Yes No No  Number falls in past yr: - 1 1 - -  Injury with Fall? - No No - -  Comment - - - - -  Risk for fall due to : - - History of fall(s) - -  Follow up - - Education provided - -   Functional Status Survey:    Vitals:   01/27/19 1548  BP: 119/62  Pulse: 72  Resp: 18  Temp: (!) 97.1 F (36.2 C)  SpO2: 96%   There is no height or weight on file to calculate BMI. Physical Exam Vitals signs and nursing note reviewed.  Constitutional:      Comments: Frail thin and pale appearing  Musculoskeletal:        General: Swelling (right wrist with erythema to wrist and right metacarpals) and tenderness (right wrist) present.     Right lower leg: No edema.     Left lower leg: No edema.  Neurological:     Mental Status: She is alert.     Labs reviewed: Recent Labs    04/13/18  NA 143  K 4.3  BUN 29*  CREATININE 0.6   Recent Labs    04/13/18  AST 22  ALT 25  ALKPHOS 107   Recent Labs    04/13/18  WBC 5.8  HGB 13.2  HCT 40  PLT 177   Lab Results  Component Value Date   TSH 4.50 04/13/2018   Lab Results  Component Value Date   HGBA1C 6.0 (H) 10/14/2015   Lab Results  Component Value Date   CHOL 166 11/17/2016    HDL 106 (A) 11/17/2016   LDLCALC 53 11/17/2016   TRIG 36 (A) 11/17/2016   CHOLHDL 3.2 10/14/2015    Significant Diagnostic Results in last 30 days:  No results found.  Assessment/Plan 1. Right wrist pain Likely gout related, if this reoccurs could consider additional therapy but given her goals of care and declining status this may not be warranted.  Prednisone 20 mg bid x 5 days Ice as needed for pain, also has roxanol as needed for pain    Family/ staff Communication: resident and nurse  Labs/tests ordered:  NA

## 2019-02-07 ENCOUNTER — Non-Acute Institutional Stay (SKILLED_NURSING_FACILITY): Payer: Medicare Other | Admitting: Adult Health

## 2019-02-07 DIAGNOSIS — F0150 Vascular dementia without behavioral disturbance: Secondary | ICD-10-CM

## 2019-02-07 DIAGNOSIS — F0280 Dementia in other diseases classified elsewhere without behavioral disturbance: Secondary | ICD-10-CM | POA: Diagnosis not present

## 2019-02-07 DIAGNOSIS — K5901 Slow transit constipation: Secondary | ICD-10-CM

## 2019-02-07 DIAGNOSIS — K219 Gastro-esophageal reflux disease without esophagitis: Secondary | ICD-10-CM | POA: Diagnosis not present

## 2019-02-07 DIAGNOSIS — M159 Polyosteoarthritis, unspecified: Secondary | ICD-10-CM

## 2019-02-07 DIAGNOSIS — I10 Essential (primary) hypertension: Secondary | ICD-10-CM | POA: Diagnosis not present

## 2019-02-07 DIAGNOSIS — G309 Alzheimer's disease, unspecified: Secondary | ICD-10-CM

## 2019-02-07 DIAGNOSIS — R1312 Dysphagia, oropharyngeal phase: Secondary | ICD-10-CM | POA: Diagnosis not present

## 2019-02-10 ENCOUNTER — Encounter: Payer: Self-pay | Admitting: Adult Health

## 2019-02-10 NOTE — Progress Notes (Signed)
Location:  Occupational psychologist of Service:  SNF (31) Provider:  Cindi Carbon, ANP [place] [phone]  Gayland Curry, DO  Patient Care Team: Gayland Curry, DO as PCP - General (Geriatric Medicine) Community, Well Angelique Holm, Margreta Journey, NP as Nurse Practitioner (Nurse Practitioner) Rosalin Hawking, MD as Consulting Physician (Neurology)  Extended Emergency Contact Information Primary Emergency Contact: Lynden Oxford [place] [address] Johnnette Litter of [place] Phone: [phone] Mobile Phone: [phone] Relation: Daughter Secondary Emergency Contact: Reed,Tilden  Johnnette Litter of [place] Phone: [phone] Mobile Phone: [phone] Relation: Relative  Code Status:  DNR Goals of care: Advanced Directive information Advanced Directives 03/16/2018  Does Patient Have a Medical Advance Directive? Yes  Type of Paramedic of [place];Out of facility DNR (pink MOST or yellow form)  Does patient want to make changes to medical advance directive? No - Patient declined  Copy of [place] in Chart? Yes  Pre-existing out of facility DNR order (yellow form or pink MOST form) Yellow form placed in chart (order not valid for inpatient use)     Chief Complaint  Patient presents with  . Medical Management of Chronic Issues    HPI:  Pt is a 85 y.o. female seen today for medical management of chronic diseases.    OA: chronic pain to neck, knees, hips, and hands. Currently she denies pain. Nurse reports that occasionally she uses morphine for pain and finds relief.  Dysphagia: no cough, choking or issues with her current diet D2 with HTL  Needs assistance with ADLs and forgetful of the details of her care due to progressive dementia. No issues with behavior.   BP ok Weight continues to trend down Wt Readings from Last 3 Encounters:  02/10/19 135 lb (61.2 kg)  01/07/19 138  lb 11.2 oz (62.9 kg)  12/14/18 149 lb 14.4 oz (68 kg)    Denies indigestion, nausea, abd pain, burning etc.  Functional status: hoyer lift and incontinent Past Medical History:  Diagnosis Date  . Abnormality of gait   . Allergic rhinitis   . Anxiety   . Arthritis   . Carotid stenosis, right    asymptomatic  <40%  right  ica  and right eca  . Carpal tunnel syndrome on both sides   . Cerebrovascular accident (CVA) due to thrombosis of right middle cerebral artery ([place])   . Claudication, intermittent ([place])   . COPD (chronic obstructive pulmonary disease) ([place])   . Diverticulosis of colon   . Fracture, calcaneus closed   . History of esophagitis   . History of hypothyroidism   . Hypertension   . Internal hemorrhoids   . Irritable bowel syndrome 03/11/2010  . PAD (peripheral artery disease) ([place]) monitored by dr Bobette Mo   moderate  . Peripheral vascular disease ([place])   . Stroke ([place])   . Unspecified hereditary and idiopathic peripheral neuropathy   . Urge urinary incontinence    Past Surgical History:  Procedure Laterality Date  . CAROTID ENDARTERECTOMY Left 11-18-2007  . CATARACT EXTRACTION W/ INTRAOCULAR LENS IMPLANT Right 11/2012  . COLONOSCOPY  2005   mild diverticulosis  . CYSTOSCOPY WITH INJECTION N/A 09/06/2013   Procedure: CYSTOSCOPY WITH BOTOX  INJECTION;  Surgeon: Reece Packer, MD;  Location: [facility];  Service: Urology;  Laterality: N/A;  . DILATION AND CURETTAGE OF UTERUS  1973    Allergies  Allergen Reactions  .  Tramadol Nausea Only    Outpatient Encounter Medications as of 02/07/2019  Medication Sig  . alum & mag hydroxide-simeth (MAALOX/MYLANTA) 200-200-20 MG/5ML suspension Take 15 mLs by mouth daily as needed for indigestion or heartburn.  . bisacodyl (DULCOLAX) 10 MG suppository Place 10 mg rectally daily as needed for moderate constipation.  . feeding supplement, ENSURE, (ENSURE) PUDG Take 1 Container by mouth daily as needed.   Marland Kitchen. morphine (ROXANOL) 20 MG/ML concentrated solution Take 0.25 mLs (5 mg total) by mouth every 4 (four) hours as needed for severe pain.  Marland Kitchen. senna-docusate (SENOKOT-S) 8.6-50 MG tablet Take 2 tablets by mouth 2 (two) times daily.  Marland Kitchen. ZINC OXIDE, TOPICAL, 10 % CREA Apply topically as needed.   No facility-administered encounter medications on file as of 02/07/2019.     Review of Systems  Constitutional: Positive for unexpected weight change. Negative for activity change, appetite change, chills, diaphoresis, fatigue and fever.  HENT: Negative for congestion.   Respiratory: Negative for cough, shortness of breath and wheezing.   Cardiovascular: Negative for chest pain, palpitations and leg swelling.  Gastrointestinal: Negative for abdominal distention, abdominal pain, constipation and diarrhea.  Genitourinary: Negative for difficulty urinating and dysuria.  Musculoskeletal: Positive for arthralgias and gait problem. Negative for back pain, joint swelling and myalgias.  Neurological: Negative for dizziness, tremors, seizures, syncope, facial asymmetry, speech difficulty, weakness, light-headedness, numbness and headaches.  Psychiatric/Behavioral: Positive for confusion. Negative for agitation and behavioral problems.    Immunization History  Administered Date(s) Administered  . Influenza Inj Mdck Quad Pf 03/13/2016  . Influenza,inj,Quad PF,6+ Mos 03/16/2018  . Influenza-Unspecified 03/09/2013, 03/01/2014, 03/08/2015, 03/19/2017  . PPD Test 12/04/2011  . Pneumococcal Conjugate-13 09/12/2014  . Pneumococcal Polysaccharide-23 09/12/2015  . Td 06/03/2016  . Zoster Recombinat (Shingrix) 06/26/2017   Pertinent  Health Maintenance Due  Topic Date Due  . INFLUENZA VACCINE  12/25/2018  . PNA vac Low Risk Adult  Completed   Fall Risk  09/16/2017 10/21/2016 09/10/2016 02/18/2016 09/12/2015  Falls in the past year? No Yes Yes No No  Number falls in past yr: - 1 1 - -  Injury with Fall? - No No - -   Comment - - - - -  Risk for fall due to : - - History of fall(s) - -  Follow up - - Education provided - -   Functional Status Survey:    Vitals:   02/10/19 1600  Weight: 135 lb (61.2 kg)   Body mass index is 22.47 kg/m. Physical Exam Vitals signs and nursing note reviewed.  Constitutional:      General: She is not in acute distress.    Appearance: She is not diaphoretic.     Comments: Frail and thin female  HENT:     Head: Normocephalic and atraumatic.  Neck:     Vascular: No JVD.  Cardiovascular:     Rate and Rhythm: Normal rate and regular rhythm.     Heart sounds: No murmur.  Pulmonary:     Effort: Pulmonary effort is normal. No respiratory distress.     Breath sounds: Normal breath sounds. No wheezing.  Abdominal:     General: Bowel sounds are normal. There is no distension.     Palpations: Abdomen is soft.  Musculoskeletal:     Right lower leg: No edema.     Left lower leg: No edema.  Skin:    General: Skin is warm and dry.  Neurological:     Mental Status: She is alert and  oriented to person, place, and time. Mental status is at baseline.     Comments: Oriented to self only. Generally weak, more so on the left leg.   Psychiatric:        Mood and Affect: Mood normal.     Labs reviewed: Recent Labs    04/13/18  NA 143  K 4.3  BUN 29*  CREATININE 0.6   Recent Labs    04/13/18  AST 22  ALT 25  ALKPHOS 107   Recent Labs    04/13/18  WBC 5.8  HGB 13.2  HCT 40  PLT 177   Lab Results  Component Value Date   TSH 4.50 04/13/2018   Lab Results  Component Value Date   HGBA1C 6.0 (H) 10/14/2015   Lab Results  Component Value Date   CHOL 166 11/17/2016   HDL 106 (A) 11/17/2016   LDLCALC 53 11/17/2016   TRIG 36 (A) 11/17/2016   CHOLHDL 3.2 10/14/2015    Significant Diagnostic Results in last 30 days:  No results found.  1. Generalized osteoarthritis of multiple sites Chronic pain is controlled with as need roxanol at this time. She is  nearing the end of life with dementia and weight loss, would not taper  2. Slow transit constipation Continue senokot s 2 tabs BID Dulcolax as needed   3. Gastroesophageal reflux disease without esophagitis Continue mintox as needed  4. Oropharyngeal dysphagia Continue modified diet and asp prec Losing weight due to decline associated with dementia and dysphagia Goals of care are comfort based   5. Mixed Alzheimer's and vascular dementia (HCC) Progressive decline in cognition and physical function c/w the disease. Continue supportive care in the skilled environment.  6. Essential hypertension Controlled    Family/ staff Communication: staff  Labs/tests ordered:  NA

## 2019-02-23 DIAGNOSIS — Z9189 Other specified personal risk factors, not elsewhere classified: Secondary | ICD-10-CM | POA: Diagnosis not present

## 2019-03-03 DIAGNOSIS — Z20828 Contact with and (suspected) exposure to other viral communicable diseases: Secondary | ICD-10-CM | POA: Diagnosis not present

## 2019-03-08 ENCOUNTER — Encounter: Payer: Self-pay | Admitting: Internal Medicine

## 2019-03-08 ENCOUNTER — Non-Acute Institutional Stay (SKILLED_NURSING_FACILITY): Payer: Medicare Other | Admitting: Internal Medicine

## 2019-03-08 DIAGNOSIS — Z515 Encounter for palliative care: Secondary | ICD-10-CM | POA: Diagnosis not present

## 2019-03-08 DIAGNOSIS — F0150 Vascular dementia without behavioral disturbance: Secondary | ICD-10-CM | POA: Diagnosis not present

## 2019-03-08 DIAGNOSIS — R1312 Dysphagia, oropharyngeal phase: Secondary | ICD-10-CM | POA: Diagnosis not present

## 2019-03-08 DIAGNOSIS — M159 Polyosteoarthritis, unspecified: Secondary | ICD-10-CM | POA: Diagnosis not present

## 2019-03-08 DIAGNOSIS — F0280 Dementia in other diseases classified elsewhere without behavioral disturbance: Secondary | ICD-10-CM | POA: Diagnosis not present

## 2019-03-08 DIAGNOSIS — G309 Alzheimer's disease, unspecified: Secondary | ICD-10-CM | POA: Diagnosis not present

## 2019-03-08 DIAGNOSIS — I69359 Hemiplegia and hemiparesis following cerebral infarction affecting unspecified side: Secondary | ICD-10-CM

## 2019-03-08 DIAGNOSIS — K5901 Slow transit constipation: Secondary | ICD-10-CM

## 2019-03-08 NOTE — Progress Notes (Signed)
Patient ID: Tara Walters, female   DOB: 1921/08/18, 85 y.o.   MRN: [id number]  Location:  [facility] Room Number: 130-A Place of Service:  SNF (31) Provider:   Kermit Baloeed, Rogers Ditter L, DO  Patient Care Team: Kermit Baloeed, Adaia Matthies L, DO as PCP - General (Geriatric Medicine) [facility] Christina, NP as Nurse Practitioner (Nurse Practitioner) Marvel PlanXu, Jindong, MD as Consulting Physician (Neurology)  Extended Emergency Contact Information Primary Emergency Contact: Lanora Maniseed,April          [place] [id number] [place] Home Phone: [phone] Mobile Phone: [phone] Relation: Daughter Secondary Emergency Contact: Keni Wafer,[place]  [place] Home Phone: [phone] Mobile Phone: [phone] Relation: Relative  Code Status:  DNR  Goals of care: Advanced Directive information Advanced Directives 03/08/2019  Does Patient Have a Medical Advance Directive? Yes  Type of Estate agentAdvance Directive Healthcare Power of [place]oll[place]orney;Living will;Out of facility DNR (pink MOST or yellow form)  Does patient want to make changes to medical advance directive? No - Patient declined  Copy of Healthcare Power of Attorney in Chart? Yes - validated most recent copy scanned in chart (See row information)  Pre-existing out of facility DNR order (yellow form or pink MOST form) Yellow form placed in chart (order not valid for inpatient use)     Chief Complaint  Patient presents with  . Medical Management of Chronic Issues    [facility]   . Immunizations    Flu vaccine will be administered at the facility this month 02/2019     HPI:  Pt is a 85 y.o. female seen today for medical management of chronic diseases.  She is receiving comfort care as per her MOST form.    Required suppository 10/8 after no bm for 3 days Resident's daughter was making arrangements for her death and wanted to know where she wanted  her ashes spread which has confused her per nursing notes.    Intake at meals is 20-25% per some notes.  Gets ensure pudding at one meal and magic cup at another.  Regular, Honey Thick, Pureed/Dysphagia 1 Special Instructions: No straws. Serve honey-thick ginger ale with every meal.  When I see [name], she was quite delirious, her speech was incomprehensible, she was resting in bed for her nap.  She was trying to talk with me, but speech was more slurred than our last meeting 3 mos ago and mind not clear.  She also refused to wear her dentures this morning.  She'd visibly lost more weight.  She was unable to swallow her secretions well and was coughing on them.    Past Medical History:  Diagnosis Date  . Abnormality of gait   . Allergic rhinitis   . Anxiety   . Arthritis   . Carotid stenosis, right    asymptomatic  <40%  right  ica  and right eca  . Carpal tunnel syndrome on both sides   . Cerebrovascular accident (CVA) due to thrombosis of right middle cerebral artery (HCC)   . Claudication, intermittent (HCC)   . COPD (chronic obstructive pulmonary disease) (HCC)   . Diverticulosis of colon   . Fracture, calcaneus closed   . History of esophagitis   . History of hypothyroidism   . Hypertension   . Internal hemorrhoids   . Irritable bowel syndrome 03/11/2010  . PAD (peripheral artery disease) (HCC) monitored by dr Rubye [place]   moderate  . Peripheral vascular disease (HCC)   .  Stroke ([place])   . Unspecified hereditary and idiopathic peripheral neuropathy   . Urge urinary incontinence    Past Surgical History:  Procedure Laterality Date  . CAROTID ENDARTERECTOMY Left 11-18-2007  . CATARACT EXTRACTION W/ INTRAOCULAR LENS IMPLANT Right 11/2012  . COLONOSCOPY  2005   mild diverticulosis  . CYSTOSCOPY WITH INJECTION N/A 09/06/2013   Procedure: CYSTOSCOPY WITH BOTOX  INJECTION;  Surgeon: Reece Packer, MD;  Location: [place];  Service: Urology;  Laterality:  N/A;  . DILATION AND CURETTAGE OF UTERUS  1973    Allergies  Allergen Reactions  . Tramadol Nausea Only    Outpatient Encounter Medications as of 03/08/2019  Medication Sig  . alum & mag hydroxide-simeth (MAALOX/MYLANTA) 200-200-20 MG/5ML suspension Take 15 mLs by mouth daily as needed for indigestion or heartburn.  Marland Kitchen antiseptic oral rinse (BIOTENE) LIQD 15 mLs by Mouth Rinse route 4 (four) times daily as needed for dry mouth.  . bisacodyl (DULCOLAX) 10 MG suppository Place 10 mg rectally daily as needed for moderate constipation.  . feeding supplement, ENSURE, (ENSURE) PUDG Take 1 Container by mouth daily as needed.  Marland Kitchen morphine (ROXANOL) 20 MG/ML concentrated solution Take 0.25 mLs (5 mg total) by mouth every 4 (four) hours as needed for severe pain.  . Nutritional Supplements (NUTRITIONAL DRINK PO) Take by mouth. Magic Cup as needed  . senna-docusate (SENOKOT-S) 8.6-50 MG tablet Take 1 tablet by mouth 2 (two) times daily.   Marland Kitchen ZINC OXIDE, TOPICAL, 10 % CREA Apply topically as needed.   No facility-administered encounter medications on file as of 03/08/2019.     Review of Systems  Constitutional: Positive for activity change, appetite change (decrease), fatigue and unexpected weight change.  HENT: Positive for trouble swallowing and voice change. Negative for congestion.   Eyes:       Appeared to be hallucinating  Respiratory: Positive for cough and choking. Negative for shortness of breath.   Gastrointestinal: Positive for constipation. Negative for abdominal pain, nausea and vomiting.  Genitourinary: Negative for dysuria.  Musculoskeletal: Positive for arthralgias, back pain and gait problem.  Skin: Positive for pallor.  Neurological: Positive for weakness. Negative for dizziness.  Psychiatric/Behavioral: Positive for confusion. Negative for agitation and sleep disturbance. The patient is not nervous/anxious.     Immunization History  Administered Date(s) Administered  .  Influenza Inj Mdck Quad Pf 03/13/2016  . Influenza,inj,Quad PF,6+ Mos 03/16/2018  . Influenza-Unspecified 03/09/2013, 03/01/2014, 03/08/2015, 03/19/2017  . PPD Test 12/04/2011  . Pneumococcal Conjugate-13 09/12/2014  . Pneumococcal Polysaccharide-23 09/12/2015  . Td 06/03/2016  . Zoster Recombinat (Shingrix) 06/26/2017   Pertinent  Health Maintenance Due  Topic Date Due  . INFLUENZA VACCINE  12/25/2018  . PNA vac Low Risk Adult  Completed   Fall Risk  09/16/2017 10/21/2016 09/10/2016 02/18/2016 09/12/2015  Falls in the past year? No Yes Yes No No  Number falls in past yr: - 1 1 - -  Injury with Fall? - No No - -  Comment - - - - -  Risk for fall due to : - - History of fall(s) - -  Follow up - - Education provided - -   Functional Status Survey:    Vitals:   03/08/19 1325  BP: 109/73  Pulse: 84  Resp: 18  Temp: 98.4 F (36.9 C)  SpO2: 94%  Weight: 131 lb 14.4 oz (59.8 kg)   Body mass index is 21.95 kg/m. Physical Exam Vitals signs reviewed.  Constitutional:  Appearance: She is ill-appearing. She is not toxic-appearing.     Comments: Cachectic with wasting of buccal muscles; not wearing dentures; gurgle to her voice and in her throat  HENT:     Head: Normocephalic and atraumatic.  Cardiovascular:     Rate and Rhythm: Normal rate and regular rhythm.     Heart sounds: No murmur.  Pulmonary:     Effort: Pulmonary effort is normal.     Breath sounds: Rhonchi present.  Abdominal:     General: Bowel sounds are normal.     Palpations: Abdomen is soft.     Tenderness: There is no abdominal tenderness.  Musculoskeletal:     Comments: Weak and difficult to sit up in bed for exam  Skin:    General: Skin is warm and dry.     Coloration: Skin is pale.  Neurological:     Motor: Weakness present.     Comments: Confused, dysarthric, wet secretions when speaking and having difficulty swallowing secretions     Labs reviewed: Recent Labs    04/13/18  NA 143  K 4.3   BUN 29*  CREATININE 0.6   Recent Labs    04/13/18  AST 22  ALT 25  ALKPHOS 107   Recent Labs    04/13/18  WBC 5.8  HGB 13.2  HCT 40  PLT 177   Lab Results  Component Value Date   TSH 4.50 04/13/2018   Lab Results  Component Value Date   HGBA1C 6.0 (H) 10/14/2015   Lab Results  Component Value Date   CHOL 166 11/17/2016   HDL 106 (A) 11/17/2016   LDLCALC 53 11/17/2016   TRIG 36 (A) 11/17/2016   CHOLHDL 3.2 10/14/2015     Assessment/Plan 1. Oropharyngeal dysphagia -has gotten markedly worse and on thickened liquid diet but still seems to be having episodes of aspiration and pocketing secretions  2. Hemiparesis due to old stroke [facility]) -persists, requires skilled care for adls, cont this support  3. Mixed Alzheimer's and vascular dementia (HCC) -has progressed tremendously over the past year  4. Generalized osteoarthritis of multiple sites -cont morphine for pain as lower potency agents were no longer effective  5. Slow transit constipation -cont current regimen  6.  End of life care -pt appears to be near end of life with increased secretions and difficulty swallowing them, notable decline in past three months -added prn lorazepam and atropine drops to her regimen -does have MOST for comfort care and already on morphine  Family/ staff Communication: discussed with SNF nurse who was speaking with pt's daughter on phone  Labs/tests ordered:  No new  Refoel Palladino L. Keniya Schlotterbeck, D.O. Geriatrics [facility] [address], [place] [address] Cell Phone (Mon-Fri 8am-5pm):  [phone] On Call:  [phone] & follow prompts after 5pm & weekends Office Phone:  [phone] Office Fax:  [phone]

## 2019-03-09 DIAGNOSIS — Z9189 Other specified personal risk factors, not elsewhere classified: Secondary | ICD-10-CM | POA: Diagnosis not present

## 2019-03-11 ENCOUNTER — Encounter: Payer: Self-pay | Admitting: Internal Medicine

## 2019-03-14 DIAGNOSIS — Z20828 Contact with and (suspected) exposure to other viral communicable diseases: Secondary | ICD-10-CM | POA: Diagnosis not present

## 2019-03-14 DIAGNOSIS — Z9189 Other specified personal risk factors, not elsewhere classified: Secondary | ICD-10-CM | POA: Diagnosis not present

## 2019-03-21 DIAGNOSIS — Z9189 Other specified personal risk factors, not elsewhere classified: Secondary | ICD-10-CM | POA: Diagnosis not present

## 2019-03-21 DIAGNOSIS — Z20828 Contact with and (suspected) exposure to other viral communicable diseases: Secondary | ICD-10-CM | POA: Diagnosis not present

## 2019-03-28 DIAGNOSIS — Z9189 Other specified personal risk factors, not elsewhere classified: Secondary | ICD-10-CM | POA: Diagnosis not present

## 2019-03-29 ENCOUNTER — Encounter: Payer: Self-pay | Admitting: Internal Medicine

## 2019-03-29 ENCOUNTER — Non-Acute Institutional Stay (SKILLED_NURSING_FACILITY): Payer: Medicare Other | Admitting: Internal Medicine

## 2019-03-29 DIAGNOSIS — G309 Alzheimer's disease, unspecified: Secondary | ICD-10-CM | POA: Diagnosis not present

## 2019-03-29 DIAGNOSIS — R1312 Dysphagia, oropharyngeal phase: Secondary | ICD-10-CM

## 2019-03-29 DIAGNOSIS — F0280 Dementia in other diseases classified elsewhere without behavioral disturbance: Secondary | ICD-10-CM | POA: Diagnosis not present

## 2019-03-29 DIAGNOSIS — Z515 Encounter for palliative care: Secondary | ICD-10-CM | POA: Diagnosis not present

## 2019-03-29 DIAGNOSIS — F0150 Vascular dementia without behavioral disturbance: Secondary | ICD-10-CM | POA: Diagnosis not present

## 2019-03-29 NOTE — Progress Notes (Signed)
Patient ID: Tara Walters, female   DOB: [birth year], 85 y.o.   MRN: [id number]   Location:  [facility] Room Number: 130-A Place of Service:  SNF (31) Provider:  [name], DO  Patient Care Team: [name], DO as PCP - General (Geriatric Medicine) [facility]ky, NP as Nurse Practitioner (Nurse Practitioner) Marvel Plan, MD as Consulting Physician (Neurology)  Extended Emergency Contact Information Primary Emergency Contact: [name] [address] [place] Home Phone: [phone] Mobile Phone: [phone] Relation: Daughter Secondary Emergency Contact: Ica Daye,[place]  [place] Home Phone: [phone] Mobile Phone: [phone] Relation: Relative  Code Status:  DNR  Goals of care: Advanced Directive information Advanced Directives 03/29/2019  Does Patient Have a Medical Advance Directive? Yes  Type of Estate agent of [place];Living will;Out of facility DNR (pink MOST or yellow form)  Does patient want to make changes to medical advance directive? No - Patient declined  Copy of Healthcare Power of Attorney in Chart? Yes - validated most recent copy scanned in chart (See row information)  Pre-existing out of facility DNR order (yellow form or pink MOST form) Yellow form placed in chart (order not valid for inpatient use)     Chief Complaint  Patient presents with  . Acute Visit    Increased dyspnea and mottling at end-of-life.    HPI:  Pt is a 85 y.o. female seen today for an acute visit for end-of-life changes.  Tara Walters has been declining and is receiving comfort care.  One of the staff members, Zella Ball, has been kindly sitting with her at this time.  She has noted increased dyspnea and rattling with difficulty containing her secretions.  She's had some bottling of her one foot and her hands.    Past Medical History:   Diagnosis Date  . Abnormality of gait   . Allergic rhinitis   . Anxiety   . Arthritis   . Carotid stenosis, right    asymptomatic  <40%  right  ica  and right eca  . Carpal tunnel syndrome on both sides   . Cerebrovascular accident (CVA) due to thrombosis of right middle cerebral artery (HCC)   . Claudication, intermittent (HCC)   . COPD (chronic obstructive pulmonary disease) (HCC)   . Diverticulosis of colon   . Fracture, calcaneus closed   . History of esophagitis   . History of hypothyroidism   . Hypertension   . Internal hemorrhoids   . Irritable bowel syndrome 03/11/2010  . PAD (peripheral artery disease) (HCC) monitored by dr Rubye [place]   moderate  . Peripheral vascular disease (HCC)   . Stroke (HCC)   . Unspecified hereditary and idiopathic peripheral neuropathy   . Urge urinary incontinence    Past Surgical History:  Procedure Laterality Date  . CAROTID ENDARTERECTOMY Left 11-18-2007  . CATARACT EXTRACTION W/ INTRAOCULAR LENS IMPLANT Right 11/2012  . COLONOSCOPY  2005   mild diverticulosis  . CYSTOSCOPY WITH INJECTION N/A 09/06/2013   Procedure: CYSTOSCOPY WITH BOTOX  INJECTION;  Surgeon: Martina Sinner, MD;  Location: [facility];  Service: Urology;  Laterality: N/A;  . DILATION AND CURETTAGE OF UTERUS  1973    Allergies  Allergen Reactions  . Tramadol Nausea Only    Outpatient Encounter Medications as of 03/29/2019  Medication Sig  . alum & mag hydroxide-simeth (MAALOX/MYLANTA) 200-200-20 MG/5ML suspension Take 15 mLs  by mouth daily as needed for indigestion or heartburn.  Marland Kitchen. antiseptic oral rinse (BIOTENE) LIQD 15 mLs by Mouth Rinse route 4 (four) times daily as needed for dry mouth.  Marland Kitchen. atropine 1 % ophthalmic solution Place 2 drops into both eyes every 6 (six) hours as needed.  . barrier cream (NON-SPECIFIED) CREA Apply 1 application topically daily. Every shift  . bisacodyl (DULCOLAX) 10 MG suppository Place 10 mg rectally daily as needed  for moderate constipation.  Marland Kitchen. LORazepam (ATIVAN) 2 MG/ML concentrated solution Take 5 mg by mouth every 2 (two) hours as needed for anxiety (and dyspnea).  . morphine (ROXANOL) 20 MG/ML concentrated solution Take 5 mg by mouth every hour as needed for severe pain (and dyspnea). Per Dr.Aniel Hubble may give every 15 min if needed  . Nutritional Supplements (NUTRITIONAL DRINK PO) Take by mouth. Magic Cup as needed  . [DISCONTINUED] feeding supplement, ENSURE, (ENSURE) PUDG Take 1 Container by mouth daily as needed.  . [DISCONTINUED] morphine (ROXANOL) 20 MG/ML concentrated solution Take 0.25 mLs (5 mg total) by mouth every 4 (four) hours as needed for severe pain.  . [DISCONTINUED] senna-docusate (SENOKOT-S) 8.6-50 MG tablet Take 1 tablet by mouth 2 (two) times daily.   . [DISCONTINUED] UNABLE TO FIND Med Name: Magic Cup as needed at dinner  . [DISCONTINUED] ZINC OXIDE, TOPICAL, 10 % CREA Apply topically as needed.   No facility-administered encounter medications on file as of 03/29/2019.     Review of Systems  Constitutional: Positive for activity change, appetite change and unexpected weight change. Negative for chills and fever.  HENT: Positive for trouble swallowing. Negative for sore throat.   Respiratory: Positive for cough and shortness of breath.   Cardiovascular: Negative for leg swelling.  Gastrointestinal: Negative for abdominal pain.  Genitourinary: Negative for dysuria.  Musculoskeletal: Positive for arthralgias.  Neurological: Positive for weakness.  Hematological: Bruises/bleeds easily.  Psychiatric/Behavioral: Positive for confusion and hallucinations.    Immunization History  Administered Date(s) Administered  . Influenza Inj Mdck Quad Pf 03/13/2016  . Influenza, High Dose Seasonal PF 03/10/2019  . Influenza,inj,Quad PF,6+ Mos 03/16/2018  . Influenza-Unspecified 03/09/2013, 03/01/2014, 03/08/2015, 03/19/2017  . PPD Test 12/04/2011  . Pneumococcal Conjugate-13 09/12/2014  .  Pneumococcal Polysaccharide-23 09/12/2015  . Td 06/03/2016  . Zoster Recombinat (Shingrix) 06/26/2017   Pertinent  Health Maintenance Due  Topic Date Due  . INFLUENZA VACCINE  Completed  . PNA vac Low Risk Adult  Completed   Fall Risk  09/16/2017 10/21/2016 09/10/2016 02/18/2016 09/12/2015  Falls in the past year? No Yes Yes No No  Number falls in past yr: - 1 1 - -  Injury with Fall? - No No - -  Comment - - - - -  Risk for fall due to : - - History of fall(s) - -  Follow up - - Education provided - -   Functional Status Survey:    Vitals:   03/29/19 1220  BP: 129/68  Pulse: 87  Resp: 10  Temp: 98.1 F (36.7 C)  SpO2: (!) 83%  Weight: 131 lb 14.4 oz (59.8 kg)  Height: 5\' 5"  (1.651 m)   Body mass index is 21.95 kg/m. Physical Exam Vitals signs and nursing note reviewed.  Constitutional:      Comments: Resting peacefully, but does have some rattling in her throat that she cannot clear so coughing  Cardiovascular:     Rate and Rhythm: Normal rate and regular rhythm.  Pulmonary:     Breath sounds: Rhonchi  present.  Abdominal:     General: Bowel sounds are normal.     Palpations: Abdomen is soft.  Skin:    Comments: Mottling of hands and feet; remains fairly warm though  Neurological:     Comments: Not opening eyes or speaking at this time, but was earlier     Labs reviewed: Recent Labs    04/13/18  NA 143  K 4.3  BUN 29*  CREATININE 0.6   Recent Labs    04/13/18  AST 22  ALT 25  ALKPHOS 107   Recent Labs    04/13/18  WBC 5.8  HGB 13.2  HCT 40  PLT 177   Lab Results  Component Value Date   TSH 4.50 04/13/2018   Lab Results  Component Value Date   HGBA1C 6.0 (H) 10/14/2015   Lab Results  Component Value Date   CHOL 166 11/17/2016   HDL 106 (A) 11/17/2016   LDLCALC 53 11/17/2016   TRIG 36 (A) 11/17/2016   CHOLHDL 3.2 10/14/2015    Assessment/Plan 1. Oropharyngeal dysphagia -has remained severe  -added atropine drops for increased  secretions with difficulty managing them  2. Mixed Alzheimer's and vascular dementia ([place]) -was moderate but recently with acute delirium at end of life so hallucinating and speech has been unclear  3. End of life care -change morphine to 5mg  po q1 hr routinely and q15 mins prn shortness of breath or pain/discomfort -increase ativan to 0.5mg  po q2 hr prn anxiety or dyspnea -add atropine drops 2 orally q 6 hrs prn increased secretions   Family/ staff Communication: discussed with snf nurse and staff at bedside--family has opted not to come in  Labs/tests ordered:  None, comfort goals at end of life  Tara Walters, D.O. [facility] [address] [place],  [address] Cell Phone (Mon-Fri 8am-5pm):  [phone] On Call:  [phone] & follow prompts after 5pm & weekends Office Phone:  [phone] Office Fax:  [phone]

## 2019-04-26 DEATH — deceased
